# Patient Record
Sex: Male | Born: 1935 | Race: White | Hispanic: No | Marital: Married | Smoking: Former smoker
Health system: Southern US, Community
[De-identification: ages and names within clinical notes are randomized; demographics above are authoritative.]

## PROBLEM LIST (undated history)

## (undated) DIAGNOSIS — N009 Acute nephritic syndrome with unspecified morphologic changes: Secondary | ICD-10-CM

## (undated) DIAGNOSIS — Z8619 Personal history of other infectious and parasitic diseases: Secondary | ICD-10-CM

## (undated) DIAGNOSIS — G4730 Sleep apnea, unspecified: Secondary | ICD-10-CM

## (undated) DIAGNOSIS — I10 Essential (primary) hypertension: Secondary | ICD-10-CM

## (undated) DIAGNOSIS — R32 Unspecified urinary incontinence: Secondary | ICD-10-CM

## (undated) DIAGNOSIS — M1990 Unspecified osteoarthritis, unspecified site: Secondary | ICD-10-CM

## (undated) DIAGNOSIS — E039 Hypothyroidism, unspecified: Secondary | ICD-10-CM

## (undated) DIAGNOSIS — T7840XA Allergy, unspecified, initial encounter: Secondary | ICD-10-CM

## (undated) DIAGNOSIS — B1910 Unspecified viral hepatitis B without hepatic coma: Secondary | ICD-10-CM

## (undated) DIAGNOSIS — E785 Hyperlipidemia, unspecified: Secondary | ICD-10-CM

## (undated) DIAGNOSIS — E119 Type 2 diabetes mellitus without complications: Secondary | ICD-10-CM

## (undated) DIAGNOSIS — Z87442 Personal history of urinary calculi: Secondary | ICD-10-CM

## (undated) DIAGNOSIS — B159 Hepatitis A without hepatic coma: Secondary | ICD-10-CM

## (undated) DIAGNOSIS — H9190 Unspecified hearing loss, unspecified ear: Secondary | ICD-10-CM

## (undated) DIAGNOSIS — IMO0002 Reserved for concepts with insufficient information to code with codable children: Secondary | ICD-10-CM

## (undated) DIAGNOSIS — L578 Other skin changes due to chronic exposure to nonionizing radiation: Secondary | ICD-10-CM

## (undated) DIAGNOSIS — H548 Legal blindness, as defined in USA: Secondary | ICD-10-CM

## (undated) DIAGNOSIS — N400 Enlarged prostate without lower urinary tract symptoms: Secondary | ICD-10-CM

## (undated) DIAGNOSIS — I2510 Atherosclerotic heart disease of native coronary artery without angina pectoris: Secondary | ICD-10-CM

## (undated) DIAGNOSIS — D649 Anemia, unspecified: Secondary | ICD-10-CM

## (undated) DIAGNOSIS — H269 Unspecified cataract: Secondary | ICD-10-CM

## (undated) DIAGNOSIS — K219 Gastro-esophageal reflux disease without esophagitis: Secondary | ICD-10-CM

## (undated) DIAGNOSIS — A159 Respiratory tuberculosis unspecified: Secondary | ICD-10-CM

## (undated) HISTORY — DX: Benign prostatic hyperplasia without lower urinary tract symptoms: N40.0

## (undated) HISTORY — PX: EYE SURGERY: SHX253

## (undated) HISTORY — DX: Unspecified cataract: H26.9

## (undated) HISTORY — DX: Atherosclerotic heart disease of native coronary artery without angina pectoris: I25.10

## (undated) HISTORY — PX: CATARACT EXTRACTION W/ INTRAOCULAR LENS  IMPLANT, BILATERAL: SHX1307

## (undated) HISTORY — DX: Hepatitis a without hepatic coma: B15.9

## (undated) HISTORY — PX: JOINT REPLACEMENT: SHX530

## (undated) HISTORY — DX: Unspecified osteoarthritis, unspecified site: M19.90

## (undated) HISTORY — DX: Allergy, unspecified, initial encounter: T78.40XA

## (undated) HISTORY — PX: TONSILLECTOMY: SUR1361

## (undated) HISTORY — DX: Acute nephritic syndrome with unspecified morphologic changes: N00.9

## (undated) HISTORY — PX: UMBILICAL HERNIA REPAIR: SHX196

## (undated) HISTORY — DX: Other skin changes due to chronic exposure to nonionizing radiation: L57.8

## (undated) HISTORY — PX: RETINAL DETACHMENT SURGERY: SHX105

## (undated) HISTORY — DX: Personal history of other infectious and parasitic diseases: Z86.19

## (undated) HISTORY — DX: Hyperlipidemia, unspecified: E78.5

## (undated) HISTORY — DX: Essential (primary) hypertension: I10

## (undated) HISTORY — PX: CARDIAC CATHETERIZATION: SHX172

## (undated) HISTORY — PX: INGUINAL HERNIA REPAIR: SUR1180

## (undated) HISTORY — DX: Anemia, unspecified: D64.9

## (undated) HISTORY — DX: Hypothyroidism, unspecified: E03.9

## (undated) HISTORY — PX: UVULOPALATOPLASTY: SHX2633

---
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
## 1979-04-10 DIAGNOSIS — B1910 Unspecified viral hepatitis B without hepatic coma: Secondary | ICD-10-CM

## 1979-04-10 HISTORY — DX: Unspecified viral hepatitis B without hepatic coma: B19.10

## 1989-04-09 DIAGNOSIS — G4730 Sleep apnea, unspecified: Secondary | ICD-10-CM

## 1989-04-09 HISTORY — DX: Sleep apnea, unspecified: G47.30

## 2002-07-03 ENCOUNTER — Encounter: Payer: Self-pay | Admitting: Ophthalmology

## 2002-07-03 ENCOUNTER — Ambulatory Visit (HOSPITAL_COMMUNITY): Admission: RE | Admit: 2002-07-03 | Discharge: 2002-07-03 | Payer: Self-pay | Admitting: Ophthalmology

## 2002-07-04 ENCOUNTER — Encounter (INDEPENDENT_AMBULATORY_CARE_PROVIDER_SITE_OTHER): Payer: Self-pay | Admitting: *Deleted

## 2004-01-22 ENCOUNTER — Inpatient Hospital Stay (HOSPITAL_COMMUNITY): Admission: RE | Admit: 2004-01-22 | Discharge: 2004-01-28 | Payer: Self-pay | Admitting: Cardiology

## 2004-01-23 HISTORY — PX: CORONARY ARTERY BYPASS GRAFT: SHX141

## 2004-02-17 ENCOUNTER — Encounter (HOSPITAL_COMMUNITY): Admission: RE | Admit: 2004-02-17 | Discharge: 2004-05-17 | Payer: Self-pay | Admitting: *Deleted

## 2004-03-20 ENCOUNTER — Ambulatory Visit (HOSPITAL_COMMUNITY): Admission: RE | Admit: 2004-03-20 | Discharge: 2004-03-20 | Payer: Self-pay | Admitting: Ophthalmology

## 2004-03-25 ENCOUNTER — Ambulatory Visit (HOSPITAL_COMMUNITY): Admission: AD | Admit: 2004-03-25 | Discharge: 2004-03-26 | Payer: Self-pay | Admitting: Ophthalmology

## 2004-05-18 ENCOUNTER — Encounter (HOSPITAL_COMMUNITY): Admission: RE | Admit: 2004-05-18 | Discharge: 2004-06-13 | Payer: Self-pay | Admitting: *Deleted

## 2004-06-16 ENCOUNTER — Encounter (HOSPITAL_COMMUNITY): Admission: RE | Admit: 2004-06-16 | Discharge: 2004-09-14 | Payer: Self-pay | Admitting: *Deleted

## 2004-06-19 ENCOUNTER — Ambulatory Visit: Payer: Self-pay | Admitting: Family Medicine

## 2004-07-13 ENCOUNTER — Ambulatory Visit (HOSPITAL_COMMUNITY): Admission: RE | Admit: 2004-07-13 | Discharge: 2004-07-13 | Payer: Self-pay | Admitting: Ophthalmology

## 2004-10-07 ENCOUNTER — Encounter (HOSPITAL_COMMUNITY): Admission: RE | Admit: 2004-10-07 | Discharge: 2005-01-05 | Payer: Self-pay | Admitting: *Deleted

## 2004-11-02 ENCOUNTER — Ambulatory Visit: Payer: Self-pay | Admitting: Family Medicine

## 2004-11-03 ENCOUNTER — Ambulatory Visit: Payer: Self-pay | Admitting: Family Medicine

## 2004-11-23 ENCOUNTER — Ambulatory Visit: Payer: Self-pay | Admitting: *Deleted

## 2004-11-24 ENCOUNTER — Ambulatory Visit: Payer: Self-pay | Admitting: Family Medicine

## 2005-01-07 ENCOUNTER — Encounter (HOSPITAL_COMMUNITY): Admission: RE | Admit: 2005-01-07 | Discharge: 2005-04-07 | Payer: Self-pay | Admitting: *Deleted

## 2005-02-02 ENCOUNTER — Ambulatory Visit: Payer: Self-pay | Admitting: Family Medicine

## 2005-03-24 ENCOUNTER — Ambulatory Visit: Payer: Self-pay

## 2005-04-09 ENCOUNTER — Encounter (HOSPITAL_COMMUNITY): Admission: RE | Admit: 2005-04-09 | Discharge: 2005-07-08 | Payer: Self-pay | Admitting: *Deleted

## 2005-07-09 ENCOUNTER — Encounter (HOSPITAL_COMMUNITY): Admission: RE | Admit: 2005-07-09 | Discharge: 2005-10-07 | Payer: Self-pay | Admitting: *Deleted

## 2005-10-08 ENCOUNTER — Encounter (HOSPITAL_COMMUNITY): Admission: RE | Admit: 2005-10-08 | Discharge: 2006-01-06 | Payer: Self-pay | Admitting: *Deleted

## 2006-01-07 ENCOUNTER — Encounter (HOSPITAL_COMMUNITY): Admission: RE | Admit: 2006-01-07 | Discharge: 2006-04-07 | Payer: Self-pay | Admitting: *Deleted

## 2006-02-15 ENCOUNTER — Ambulatory Visit: Payer: Self-pay | Admitting: Family Medicine

## 2006-03-16 ENCOUNTER — Ambulatory Visit: Payer: Self-pay | Admitting: Internal Medicine

## 2006-04-09 ENCOUNTER — Encounter (HOSPITAL_COMMUNITY): Admission: RE | Admit: 2006-04-09 | Discharge: 2006-07-08 | Payer: Self-pay | Admitting: *Deleted

## 2006-04-19 ENCOUNTER — Ambulatory Visit: Payer: Self-pay | Admitting: Internal Medicine

## 2007-02-13 ENCOUNTER — Ambulatory Visit: Payer: Self-pay | Admitting: Internal Medicine

## 2007-04-07 DIAGNOSIS — E039 Hypothyroidism, unspecified: Secondary | ICD-10-CM | POA: Insufficient documentation

## 2007-04-07 DIAGNOSIS — L568 Other specified acute skin changes due to ultraviolet radiation: Secondary | ICD-10-CM | POA: Insufficient documentation

## 2007-04-07 DIAGNOSIS — M109 Gout, unspecified: Secondary | ICD-10-CM | POA: Insufficient documentation

## 2007-04-07 DIAGNOSIS — Z8619 Personal history of other infectious and parasitic diseases: Secondary | ICD-10-CM | POA: Insufficient documentation

## 2007-04-07 DIAGNOSIS — N059 Unspecified nephritic syndrome with unspecified morphologic changes: Secondary | ICD-10-CM | POA: Insufficient documentation

## 2007-04-07 DIAGNOSIS — Z794 Long term (current) use of insulin: Secondary | ICD-10-CM | POA: Insufficient documentation

## 2007-04-07 DIAGNOSIS — I10 Essential (primary) hypertension: Secondary | ICD-10-CM | POA: Insufficient documentation

## 2007-04-07 DIAGNOSIS — E118 Type 2 diabetes mellitus with unspecified complications: Secondary | ICD-10-CM | POA: Insufficient documentation

## 2007-04-07 DIAGNOSIS — E119 Type 2 diabetes mellitus without complications: Secondary | ICD-10-CM | POA: Insufficient documentation

## 2007-04-07 DIAGNOSIS — E785 Hyperlipidemia, unspecified: Secondary | ICD-10-CM | POA: Insufficient documentation

## 2007-05-24 ENCOUNTER — Ambulatory Visit: Payer: Self-pay | Admitting: Family Medicine

## 2007-05-24 LAB — CONVERTED CEMR LAB
ALT: 64 units/L — ABNORMAL HIGH (ref 0–53)
AST: 76 units/L — ABNORMAL HIGH (ref 0–37)
Albumin: 4.2 g/dL (ref 3.5–5.2)
Alkaline Phosphatase: 75 units/L (ref 39–117)
BUN: 14 mg/dL (ref 6–23)
Bilirubin Urine: NEGATIVE
Calcium: 9.4 mg/dL (ref 8.4–10.5)
Cholesterol: 140 mg/dL (ref 0–200)
Creatinine,U: 196.6 mg/dL
Eosinophils Absolute: 0.1 10*3/uL (ref 0.0–0.6)
Eosinophils Relative: 2.8 % (ref 0.0–5.0)
HCT: 40.4 % (ref 39.0–52.0)
Ketones, urine, test strip: NEGATIVE
MCV: 94 fL (ref 78.0–100.0)
Microalb Creat Ratio: 6.1 mg/g (ref 0.0–30.0)
Microalb, Ur: 1.2 mg/dL (ref 0.0–1.9)
Monocytes Absolute: 0.5 10*3/uL (ref 0.2–0.7)
Monocytes Relative: 10.7 % (ref 3.0–11.0)
Neutro Abs: 2.8 10*3/uL (ref 1.4–7.7)
Platelets: 173 10*3/uL (ref 150–400)
RDW: 12.6 % (ref 11.5–14.6)
Specific Gravity, Urine: 1.025
TSH: 9.11 microintl units/mL — ABNORMAL HIGH (ref 0.35–5.50)
Total CHOL/HDL Ratio: 3.7
Triglycerides: 144 mg/dL (ref 0–149)
WBC: 4.8 10*3/uL (ref 4.5–10.5)
pH: 5.5

## 2007-05-30 ENCOUNTER — Ambulatory Visit: Payer: Self-pay | Admitting: Family Medicine

## 2007-05-30 DIAGNOSIS — N400 Enlarged prostate without lower urinary tract symptoms: Secondary | ICD-10-CM | POA: Insufficient documentation

## 2007-08-07 ENCOUNTER — Ambulatory Visit: Payer: Self-pay | Admitting: Family Medicine

## 2007-08-08 ENCOUNTER — Telehealth: Payer: Self-pay | Admitting: Family Medicine

## 2007-08-10 HISTORY — PX: QUADRICEPS REPAIR: SHX2281

## 2007-08-11 ENCOUNTER — Telehealth (INDEPENDENT_AMBULATORY_CARE_PROVIDER_SITE_OTHER): Payer: Self-pay | Admitting: *Deleted

## 2007-10-10 ENCOUNTER — Telehealth: Payer: Self-pay | Admitting: Family Medicine

## 2007-12-06 ENCOUNTER — Ambulatory Visit: Payer: Self-pay | Admitting: Internal Medicine

## 2007-12-08 ENCOUNTER — Ambulatory Visit: Payer: Self-pay | Admitting: Family Medicine

## 2007-12-08 ENCOUNTER — Telehealth: Payer: Self-pay | Admitting: Family Medicine

## 2007-12-15 ENCOUNTER — Telehealth: Payer: Self-pay | Admitting: Family Medicine

## 2008-02-07 ENCOUNTER — Ambulatory Visit: Payer: Self-pay | Admitting: Family Medicine

## 2008-05-21 ENCOUNTER — Ambulatory Visit: Payer: Self-pay | Admitting: Family Medicine

## 2008-05-29 ENCOUNTER — Ambulatory Visit: Payer: Self-pay | Admitting: Internal Medicine

## 2008-06-27 ENCOUNTER — Ambulatory Visit: Payer: Self-pay | Admitting: Family Medicine

## 2008-06-27 LAB — CONVERTED CEMR LAB
ALT: 37 units/L (ref 0–53)
AST: 44 units/L — ABNORMAL HIGH (ref 0–37)
Alkaline Phosphatase: 52 units/L (ref 39–117)
BUN: 14 mg/dL (ref 6–23)
Basophils Absolute: 0 10*3/uL (ref 0.0–0.1)
Basophils Relative: 0.4 % (ref 0.0–3.0)
Bilirubin Urine: NEGATIVE
Chloride: 103 meq/L (ref 96–112)
Cholesterol: 131 mg/dL (ref 0–200)
Eosinophils Absolute: 0.1 10*3/uL (ref 0.0–0.7)
Eosinophils Relative: 2.6 % (ref 0.0–5.0)
HCT: 40.3 % (ref 39.0–52.0)
HDL: 52.4 mg/dL (ref >39.0)
Hemoglobin: 14.1 g/dL (ref 13.0–17.0)
Microalb Creat Ratio: 4 mg/g (ref 0.0–30.0)
Microalb, Ur: 0.4 mg/dL (ref 0.0–1.9)
Monocytes Absolute: 0.4 10*3/uL (ref 0.1–1.0)
Nitrite: NEGATIVE
RBC: 4.17 M/uL — ABNORMAL LOW (ref 4.22–5.81)
Specific Gravity, Urine: 1.025
TSH: 4.68 microintl units/mL (ref 0.35–5.50)
Total Protein: 6.3 g/dL (ref 6.0–8.3)
Triglycerides: 83 mg/dL (ref 0–149)
VLDL: 17 mg/dL (ref 0–40)
WBC Urine, dipstick: NEGATIVE
WBC: 4.5 10*3/uL (ref 4.5–10.5)
pH: 5

## 2008-07-02 ENCOUNTER — Telehealth: Payer: Self-pay | Admitting: Family Medicine

## 2008-07-15 ENCOUNTER — Ambulatory Visit: Payer: Self-pay | Admitting: Family Medicine

## 2008-08-29 ENCOUNTER — Telehealth: Payer: Self-pay | Admitting: Family Medicine

## 2008-09-18 ENCOUNTER — Ambulatory Visit: Payer: Self-pay | Admitting: Family Medicine

## 2008-09-18 LAB — CONVERTED CEMR LAB
BUN: 17 mg/dL (ref 6–23)
Chloride: 108 meq/L (ref 96–112)
GFR calc Af Amer: 95 mL/min
GFR calc non Af Amer: 79 mL/min
Glucose, Bld: 145 mg/dL — ABNORMAL HIGH (ref 70–99)
Hgb A1c MFr Bld: 6.9 % — ABNORMAL HIGH (ref 4.6–6.0)
Sodium: 142 meq/L (ref 135–145)

## 2008-09-25 ENCOUNTER — Ambulatory Visit: Payer: Self-pay | Admitting: Family Medicine

## 2008-09-25 DIAGNOSIS — N400 Enlarged prostate without lower urinary tract symptoms: Secondary | ICD-10-CM | POA: Insufficient documentation

## 2009-01-03 ENCOUNTER — Ambulatory Visit: Payer: Self-pay | Admitting: Family Medicine

## 2009-01-03 DIAGNOSIS — N200 Calculus of kidney: Secondary | ICD-10-CM | POA: Insufficient documentation

## 2009-01-03 LAB — CONVERTED CEMR LAB
Ketones, urine, test strip: NEGATIVE
pH: 5

## 2009-01-08 ENCOUNTER — Ambulatory Visit: Payer: Self-pay | Admitting: Family Medicine

## 2009-01-10 ENCOUNTER — Telehealth: Payer: Self-pay | Admitting: Family Medicine

## 2009-01-13 LAB — CONVERTED CEMR LAB
BUN: 18 mg/dL (ref 6–23)
Calcium: 9.6 mg/dL (ref 8.4–10.5)
Chloride: 111 meq/L (ref 96–112)
Creatinine, Ser: 1.2 mg/dL (ref 0.4–1.5)
Glucose, Bld: 183 mg/dL — ABNORMAL HIGH (ref 70–99)
Potassium: 4.3 meq/L (ref 3.5–5.1)

## 2009-03-11 ENCOUNTER — Ambulatory Visit: Payer: Self-pay | Admitting: Family Medicine

## 2009-03-11 LAB — CONVERTED CEMR LAB
BUN: 16 mg/dL
CO2: 29 meq/L
Calcium: 9 mg/dL
Chloride: 107 meq/L
Creatinine, Ser: 1 mg/dL
GFR calc non Af Amer: 78.31 mL/min
Glucose, Bld: 125 mg/dL — ABNORMAL HIGH
Potassium: 3.8 meq/L
Sodium: 142 meq/L

## 2009-05-22 ENCOUNTER — Encounter (INDEPENDENT_AMBULATORY_CARE_PROVIDER_SITE_OTHER): Payer: Self-pay | Admitting: *Deleted

## 2009-06-23 ENCOUNTER — Telehealth: Payer: Self-pay | Admitting: Family Medicine

## 2009-06-26 DIAGNOSIS — I2510 Atherosclerotic heart disease of native coronary artery without angina pectoris: Secondary | ICD-10-CM | POA: Insufficient documentation

## 2009-06-27 ENCOUNTER — Ambulatory Visit: Payer: Self-pay | Admitting: Internal Medicine

## 2009-07-25 ENCOUNTER — Ambulatory Visit: Payer: Self-pay | Admitting: Family Medicine

## 2009-07-25 ENCOUNTER — Encounter (INDEPENDENT_AMBULATORY_CARE_PROVIDER_SITE_OTHER): Payer: Self-pay | Admitting: *Deleted

## 2009-07-25 LAB — CONVERTED CEMR LAB
ALT: 18 units/L (ref 0–53)
Alkaline Phosphatase: 46 units/L (ref 39–117)
Basophils Absolute: 0 10*3/uL (ref 0.0–0.1)
Basophils Relative: 0 % (ref 0–1)
Bilirubin, Direct: 0.2 mg/dL (ref 0.0–0.3)
CO2: 27 meq/L (ref 19–32)
Calcium: 10 mg/dL (ref 8.4–10.5)
Creatinine, Ser: 0.93 mg/dL (ref 0.40–1.50)
Creatinine, Urine: 151 mg/dL
Eosinophils Absolute: 0.1 10*3/uL (ref 0.0–0.7)
Eosinophils Relative: 2 % (ref 0–5)
HDL: 52 mg/dL (ref >39)
Hemoglobin: 14.5 g/dL (ref 13.0–17.0)
Hgb A1c MFr Bld: 6.7 % — ABNORMAL HIGH (ref 4.6–6.1)
Indirect Bilirubin: 0.6 mg/dL (ref 0.0–0.9)
Lymphs Abs: 1.9 10*3/uL (ref 0.7–4.0)
MCHC: 34.6 g/dL (ref 30.0–36.0)
MCV: 89.3 fL (ref 78.0–100.0)
Neutrophils Relative %: 54 % (ref 43–77)
PSA, Free: 0.3 ng/mL
RBC: 4.69 M/uL (ref 4.22–5.81)
RDW: 14.1 % (ref 11.5–15.5)
Total Bilirubin: 0.8 mg/dL (ref 0.3–1.2)
Total Protein: 6.5 g/dL (ref 6.0–8.3)
VLDL: 16 mg/dL (ref 0–40)
WBC: 5.5 10*3/uL (ref 4.0–10.5)

## 2009-07-30 ENCOUNTER — Ambulatory Visit: Payer: Self-pay | Admitting: Internal Medicine

## 2009-10-08 ENCOUNTER — Ambulatory Visit: Payer: Self-pay | Admitting: Family Medicine

## 2009-10-13 LAB — CONVERTED CEMR LAB
BUN: 11 mg/dL (ref 6–23)
CO2: 31 meq/L (ref 19–32)
Calcium: 9.4 mg/dL (ref 8.4–10.5)
Chloride: 104 meq/L (ref 96–112)
Creatinine, Ser: 0.9 mg/dL (ref 0.4–1.5)

## 2010-03-16 ENCOUNTER — Ambulatory Visit: Payer: Self-pay | Admitting: Internal Medicine

## 2010-07-20 ENCOUNTER — Telehealth: Payer: Self-pay | Admitting: Family Medicine

## 2010-09-08 NOTE — Assessment & Plan Note (Signed)
Summary: per check out/sf  Medications Added VITAMIN B-1 100 MG TABS (THIAMINE HCL) Take 1 tablet by mouth once a day VITAMIN B-6 50 MG TABS (PYRIDOXINE HCL) Take 1 tablet by mouth once a day NIACIN 100 MG TABS (NIACIN) Take 1 tablet by mouth once a day MULTIVITAMINS  TABS (MULTIPLE VITAMIN) Take 1 tablet by mouth once a day      Allergies Added: NKDA  Visit Type:  Follow-up Primary Glenda Spelman:  Roderick Pee MD  CC:  No complains.  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  He is a pleasant 75 yo man with a h/o CAD, HTN, s/p CABG.  Prior to his CABG he did not have any anginal symptoms.  When I had him repeat his exercise treadmill last, he had ST depression with exertional which resolved immediately with rest and was asymptomatic.  He returns for additional evaluation.  He notes that he has been more sedentary lately.  No other complaints.  His DM has been well controlled.  Current Medications (verified): 1)  Toprol Xl 50 Mg  Tb24 (Metoprolol Succinate) .... Take 1/2 Every Morning 2)  Lotensin Hct 20-25 Mg  Tabs (Benazepril-Hydrochlorothiazide) .... Take 1 Tablet By Mouth Once A Day 3)  Colchicine 0.6 Mg  Tabs (Colchicine) .... Take 1 Tablet By Mouth Once A Day 4)  Allopurinol 300 Mg  Tabs (Allopurinol) .... Take 1 Tablet By Mouth Once A Day 5)  Aspirin 325 Mg  Tbec (Aspirin) .... Once Daily 6)  Glipizide 10 Mg  Tb24 (Glipizide) .... Take 1 Tablet By Mouth Two Times A Day 7)  Omega-3 350 Mg  Caps (Omega-3 Fatty Acids) .... 2 Once Daily 8)  Glucophage 1000 Mg  Tabs (Metformin Hcl) .... Take 1 Tablet By Mouth Two Times A Day 9)  Nitroglycerin 0.4 Mg  Subl (Nitroglycerin) .... As Needed 10)  Vitamin B-1 100 Mg Tabs (Thiamine Hcl) .... Take 1 Tablet By Mouth Once A Day 11)  Vitamin B-6 50 Mg Tabs (Pyridoxine Hcl) .... Take 1 Tablet By Mouth Once A Day 12)  Niacin 100 Mg Tabs (Niacin) .... Take 1 Tablet By Mouth Once A Day 13)  Zocor 40 Mg  Tabs  (Simvastatin) .[name] Kitchen.. 1 Tab @ Bedtime 14)  Synthroid 100 Mcg  Tabs (Levothyroxine Sodium) .... Take 1 Tablet By Mouth Every Morning 15)  Lantus 100 Unit/ml Soln (Insulin Glargine) .... 24 Units At Bedtime 16)  Actos 45 Mg Tabs (Pioglitazone Hcl) .... Take 1 Tablet By Mouth Every Morning 17)  Freestyle Test  Strp (Glucose Blood) .... Use 1-2 Times Daily As Directed 18)  Tamsulosin Hcl 0.4 Mg Caps (Tamsulosin Hcl) .... Take One Cap At Bedtime 19)  Multivitamins  Tabs (Multiple Vitamin) .... Take 1 Tablet By Mouth Once A Day  Allergies (verified): No Known Drug Allergies  Past History:  Past Medical History: Last updated: 06/26/2009 Coronary artery disease status post bypass surgery with preserved left ventricular function Diabetes mellitus, type II Gout Hepatitis B, hx of Hyperlipidemia Hypertension Hypothyroidism Hepatitis A glomerulonephritis chronic sun damage Benign prostatic hypertrophy  Past Surgical History: Last updated: 04/07/2007 Coronary artery bypass graft, bilateral 2004 Inguinal herniorrhaphy, R  Review of Systems  The patient denies chest pain, syncope, dyspnea on exertion, and peripheral edema.    Vital Signs:  Patient profile:   75 year old male Height:      72.5 inches Weight:      237 pounds BMI:     31.82 Pulse  rate:   62 / minute Pulse rhythm:   regular Resp:     18 per minute BP sitting:   104 / 72  (left arm) Cuff size:   large  Vitals Entered By: Vikki Ports (March 16, 2010 3:47 PM)  Physical Exam  General:  Well-developed,well-nourished,in no acute distress; alert,appropriate and cooperative throughout examination Head:  Normocephalic and atraumatic without obvious abnormalities. No apparent alopecia or balding. Mouth:  Oral mucosa and oropharynx without lesions or exudates.  Teeth in good repair. Neck:  No deformities, masses, or tenderness noted. Chest Wall:  No deformities, masses, tenderness or gynecomastia noted. Lungs:  Normal  respiratory effort, chest expands symmetrically. Lungs are clear to auscultation, no crackles or wheezes. Heart:  Normal rate and regular rhythm. S1 and S2 normal without gallop, murmur, click, rub or other extra sounds. Abdomen:  Bowel sounds positive,abdomen soft and non-tender without masses, organomegaly or hernias noted. Msk:  No deformity or scoliosis noted of thoracic or lumbar spine.   Pulses:  R and L carotid,radial,femoral,dorsalis pedis and posterior tibial pulses are full and equal bilaterally Extremities:  No clubbing, cyanosis, edema, or deformity noted with normal full range of motion of all joints.   Neurologic:  Alert and oriented x 3.   Impression & Recommendations:  Problem # 1:  CAD (ICD-414.00) He has no symptoms. I have asked him to continue his current meds and maintain a low sodium diet. He will start walking more frequently and I will plan to obtain a perfusion stress test in 6 months when I see him back if he does not develop more symptoms. His updated medication list for this problem includes:    Toprol Xl 50 Mg Tb24 (Metoprolol succinate) .[name] Kitchen... Take 1/2 every morning    Lotensin Hct 20-25 Mg Tabs (Benazepril-hydrochlorothiazide) .[name] Kitchen... Take 1 tablet by mouth once a day    Aspirin 325 Mg Tbec (Aspirin) ..... Once daily    Nitroglycerin 0.4 Mg Subl (Nitroglycerin) .[name] Kitchen... As needed  Problem # 2:  HYPERTENSION (ICD-401.9) His blood pressure is well controlled.  He will continue meds as below. His updated medication list for this problem includes:    Toprol Xl 50 Mg Tb24 (Metoprolol succinate) .[name] Kitchen... Take 1/2 every morning    Lotensin Hct 20-25 Mg Tabs (Benazepril-hydrochlorothiazide) .[name] Kitchen... Take 1 tablet by mouth once a day    Aspirin 325 Mg Tbec (Aspirin) ..... Once daily  Problem # 3:  HYPERLIPIDEMIA (ICD-272.4) Continue current meds.  A low fat diet is requested. His updated medication list for this problem includes:    Niacin 100 Mg Tabs (Niacin) .[name] Kitchen... Take 1  tablet by mouth once a day    Zocor 40 Mg Tabs (Simvastatin) .[name] Kitchen... 1 tab @ bedtime  Patient Instructions: 1)  Your physician wants you to follow-up in: 6 months  You will receive a reminder letter in the mail two months in advance. If you don't receive a letter, please call our office to schedule the follow-up appointment.

## 2010-09-10 NOTE — Progress Notes (Signed)
Summary: refills  Phone Note Refill Request Message from:  Pharmacy on July 20, 2010 9:31 AM  Refills Requested: Medication #1:  LANTUS 100 UNIT/ML SOLN 24 units at bedtime Initial call taken by: Kern Reap CMA Duncan Dull),  July 20, 2010 9:31 AM    Prescriptions: LANTUS 100 UNIT/ML SOLN (INSULIN GLARGINE) 24 units at bedtime  #2 vials x 0   Entered by:   Kern Reap CMA (AAMA)   Authorized by:   Roderick Pee MD   Signed by:   Kern Reap CMA (AAMA) on 07/20/2010   Method used:   Faxed to ...       [facility]  [phone]* (retail)       [address] [place], [place]  [address]       Ph: [phone] or [phone]       Fax: [phone]   RxID:   [id number]

## 2010-09-16 ENCOUNTER — Other Ambulatory Visit: Payer: Self-pay | Admitting: Family Medicine

## 2010-10-04 ENCOUNTER — Encounter: Payer: Self-pay | Admitting: Family Medicine

## 2010-10-05 ENCOUNTER — Encounter: Payer: Self-pay | Admitting: Family Medicine

## 2010-10-05 ENCOUNTER — Telehealth: Payer: Self-pay | Admitting: Family Medicine

## 2010-10-05 ENCOUNTER — Ambulatory Visit (INDEPENDENT_AMBULATORY_CARE_PROVIDER_SITE_OTHER): Payer: Medicare Other | Admitting: Family Medicine

## 2010-10-05 DIAGNOSIS — E785 Hyperlipidemia, unspecified: Secondary | ICD-10-CM

## 2010-10-05 DIAGNOSIS — N401 Enlarged prostate with lower urinary tract symptoms: Secondary | ICD-10-CM

## 2010-10-05 DIAGNOSIS — E039 Hypothyroidism, unspecified: Secondary | ICD-10-CM

## 2010-10-05 DIAGNOSIS — M109 Gout, unspecified: Secondary | ICD-10-CM

## 2010-10-05 DIAGNOSIS — N138 Other obstructive and reflux uropathy: Secondary | ICD-10-CM

## 2010-10-05 DIAGNOSIS — I10 Essential (primary) hypertension: Secondary | ICD-10-CM

## 2010-10-05 DIAGNOSIS — E119 Type 2 diabetes mellitus without complications: Secondary | ICD-10-CM

## 2010-10-05 LAB — TSH: TSH: 2.22 u[IU]/mL (ref 0.35–5.50)

## 2010-10-05 LAB — POCT URINALYSIS DIPSTICK
Bilirubin, UA: NEGATIVE
Blood, UA: NEGATIVE
Glucose, UA: NEGATIVE
Leukocytes, UA: NEGATIVE
Spec Grav, UA: 1.025
Urobilinogen, UA: 0.2

## 2010-10-05 LAB — HEPATIC FUNCTION PANEL
AST: 27 U/L (ref 0–37)
Albumin: 4 g/dL (ref 3.5–5.2)
Alkaline Phosphatase: 42 U/L (ref 39–117)
Bilirubin, Direct: 0.2 mg/dL (ref 0.0–0.3)
Total Bilirubin: 0.9 mg/dL (ref 0.3–1.2)
Total Protein: 6.1 g/dL (ref 6.0–8.3)

## 2010-10-05 LAB — BASIC METABOLIC PANEL
BUN: 11 mg/dL (ref 6–23)
Calcium: 9.3 mg/dL (ref 8.4–10.5)
Creatinine, Ser: 0.9 mg/dL (ref 0.4–1.5)

## 2010-10-05 LAB — CBC WITH DIFFERENTIAL/PLATELET
Basophils Relative: 0.1 % (ref 0.0–3.0)
HCT: 43.2 % (ref 39.0–52.0)
MCHC: 35 g/dL (ref 30.0–36.0)
Monocytes Relative: 9.8 % (ref 3.0–12.0)
Neutro Abs: 3.3 10*3/uL (ref 1.4–7.7)
RBC: 4.62 Mil/uL (ref 4.22–5.81)
WBC: 5.5 10*3/uL (ref 4.5–10.5)

## 2010-10-05 LAB — HEMOGLOBIN A1C: Hgb A1c MFr Bld: 6.7 % — ABNORMAL HIGH (ref 4.6–6.5)

## 2010-10-05 LAB — URIC ACID: Uric Acid, Serum: 4.4 mg/dL (ref 4.0–7.8)

## 2010-10-05 LAB — LIPID PANEL
Cholesterol: 126 mg/dL (ref 0–200)
LDL Cholesterol: 57 mg/dL (ref 0–99)
Triglycerides: 103 mg/dL (ref 0.0–149.0)

## 2010-10-05 LAB — MICROALBUMIN / CREATININE URINE RATIO: Microalb, Ur: 0.6 mg/dL (ref 0.0–1.9)

## 2010-10-05 MED ORDER — LEVOTHYROXINE SODIUM 100 MCG PO TABS: 100.0000 ug | ORAL_TABLET | ORAL | Status: DC

## 2010-10-05 MED ORDER — PIOGLITAZONE HCL 45 MG PO TABS: 45.0000 mg | ORAL_TABLET | Freq: Every day | ORAL | Status: DC

## 2010-10-05 MED ORDER — METFORMIN HCL 1000 MG PO TABS: 1000.0000 mg | ORAL_TABLET | Freq: Two times a day (BID) | ORAL | Status: DC

## 2010-10-05 MED ORDER — INSULIN GLARGINE 100 UNIT/ML ~~LOC~~ SOLN: 30.0000 [IU] | Freq: Every day | SUBCUTANEOUS | Status: DC

## 2010-10-05 MED ORDER — METOPROLOL SUCCINATE ER 50 MG PO TB24: 50.0000 mg | ORAL_TABLET | ORAL | Status: DC

## 2010-10-05 MED ORDER — SIMVASTATIN 40 MG PO TABS: 40.0000 mg | ORAL_TABLET | Freq: Every day | ORAL | Status: DC

## 2010-10-05 MED ORDER — ALLOPURINOL 300 MG PO TABS: 300.0000 mg | ORAL_TABLET | Freq: Every day | ORAL | Status: DC

## 2010-10-05 MED ORDER — GLUCOSE BLOOD VI STRP: ORAL_STRIP | Status: DC

## 2010-10-05 MED ORDER — NITROGLYCERIN 0.4 MG SL SUBL: 0.4000 mg | SUBLINGUAL_TABLET | SUBLINGUAL | Status: DC | PRN

## 2010-10-05 MED ORDER — GLIPIZIDE 10 MG PO TABS: 10.0000 mg | ORAL_TABLET | Freq: Two times a day (BID) | ORAL | Status: DC

## 2010-10-05 NOTE — Patient Instructions (Signed)
I will call you I get her lab work back.  Consider taking the Flomax, Monday, Wednesday, Friday.  Return in 3 months for follow-up, sooner if any problems

## 2010-10-05 NOTE — Progress Notes (Signed)
Subjective:    Patient ID: MYLZ YUAN, male    DOB: 08/24/1935, 75 y.o.   MRN: [id number]  HPI Jonathan [name] is a 75 year old, married male, nonsmoker retired anesthesiologist who comes in today for evaluation of diabetes, type 1, hypertension, hyperlipidemia, hypothyroidism, coronary disease.  His diabetes is treated with Actos 45 mg daily, glipizide, 10 mg b.i.d., Glucophage, 1000 mg b.i.d., and Lantus 30 units nightly labs pending.  States his blood sugars at home, sometimes drop to 60.  No symptoms of hypoglycemia.  He takes Lotensin 20 -- 25 daily and Toprol 50 mg dose one half tab daily for hypertension.  BP 108/78, pulse 70 and regular.  He takes Zocor 40 mg nightly for hyperlipidemia.  Lipids pending.  He takes Synthroid 100 mcg q.a.m. For hypothyroidism.  TSH pending.  Recent cardiac evaluation by Dr. Ladona Ridgel.  A 75 year ago showed a normal exam and a stress test.  Heart rate 130 no EKG changes.  He does not want to go back and have another stress test because he is asymptomatic.  He also takes Flomax .4 daily for BPH  He also takes allopurinol 300 mg daily, and an aspirin tablet......[name] Kitchenhe's having more joint pain, especially his left knee.  He forgot to call and get set up for his eye exam with the ophthalmologist.  Encouraged annual eye exam   Review of Systems  Constitutional: Negative.   HENT: Negative.   Eyes: Negative.   Respiratory: Negative.   Cardiovascular: Negative.   Gastrointestinal: Negative.   Genitourinary: Negative.   Musculoskeletal: Negative.   Skin: Negative.   Neurological: Negative.   Hematological: Negative.   Psychiatric/Behavioral: Negative.        Objective:   Physical Exam  Constitutional: He is oriented to person, place, and time. He appears well-developed and well-nourished.  HENT:  Head: Normocephalic and atraumatic.  Right Ear: External ear normal.  Left Ear: External ear normal.  Nose: Nose normal.  Mouth/Throat: Oropharynx is clear and  moist.  Eyes: Conjunctivae and EOM are normal. Pupils are equal, round, and reactive to light.  Neck: Normal range of motion. Neck supple. No JVD present. No tracheal deviation present. No thyromegaly present.  Cardiovascular: Normal rate, regular rhythm, normal heart sounds and intact distal pulses.  Exam reveals no gallop and no friction rub.   No murmur heard. Pulmonary/Chest: Effort normal and breath sounds normal. No stridor. No respiratory distress. He has no wheezes. He has no rales. He exhibits no tenderness.  Abdominal: Soft. Bowel sounds are normal. He exhibits no distension and no mass. There is no tenderness. There is no rebound and no guarding.  Genitourinary: Rectum normal.  Musculoskeletal: Normal range of motion. He exhibits no edema and no tenderness.  Lymphadenopathy:    He has no cervical adenopathy.  Neurological: He is alert and oriented to person, place, and time. He has normal reflexes. No cranial nerve deficit. He exhibits normal muscle tone.  Skin: Skin is warm and dry. No rash noted. No erythema. No pallor.  Psychiatric: He has a normal mood and affect. His behavior is normal. Judgment and thought content normal.          Assessment & Plan:  Coronary disease, asymptomatic,,,, continue diet and exercise program  Diabetes type II.  Continue current treatment program.  Follow-up A1c in 3 months.  Hypertension.  Continue current treatment program.  Hyperlipidemia.  Continue current treatment program.  Hypothyroidism.  Continue Synthroid 100 mcg daily.  BPH Flomax .4, Monday, Wednesday, Friday.  History of gout.  Check uric acid level.  Continue allopurinol and aspirin.  Degenerative joint disease with increasing pain, left knee referred to orthopedist.  Chronic sun damage.  Annual exam by dermatologist.  Annual eye exam

## 2010-10-05 NOTE — Telephone Encounter (Signed)
Spoke with pharmacy

## 2010-10-05 NOTE — Telephone Encounter (Signed)
[facility] needs verified of toprol bp med

## 2010-11-19 ENCOUNTER — Other Ambulatory Visit: Payer: Self-pay | Admitting: Family Medicine

## 2010-11-19 NOTE — Telephone Encounter (Signed)
Time for an office visit 

## 2010-11-27 ENCOUNTER — Other Ambulatory Visit: Payer: Self-pay | Admitting: Family Medicine

## 2010-12-09 ENCOUNTER — Other Ambulatory Visit: Payer: Self-pay | Admitting: Family Medicine

## 2010-12-22 NOTE — Assessment & Plan Note (Signed)
[facility]                         ELECTROPHYSIOLOGY OFFICE NOTE   NAME:Jonathan [name], Jonathan [name]                      MRN:          [id number]  DATE:12/06/2007                            DOB:          06/01/1936    [name] returns today for followup.  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  He also has very mild  hypertension and dyslipidemia and diabetes.  He has been well.  He  continues to play golf regularly and he is exercising three to four  times per week.  He has had no chest pain.  He denies shortness of  breath.  He denies palpitations or peripheral edema.   CURRENT MEDICATIONS:  1. His current medications include Detrol LA.  2. He takes Flomax 0.4 a day.  3. Synthroid 60 mcg daily.  4. He is also on niacin.  5. Multiple vitamins.  6. He feels aspirin 325 a day.  7. Simvastatin 40 a day.  8. Metoprolol 25 mg a  half tablet daily.  9. Allopurinol 300 a day.  10.Actos 45 a day.  11.Glipizide 10 twice a day.  12.Metformin 1 gram twice daily.  13.Lisinopril/HCTZ 20/12.5 daily.   PHYSICAL EXAMINATION:  GENERAL:  On exam he is a pleasant well-appearing  75 year old man in no acute distress.  VITAL SIGNS:  Blood pressure was 138/78, pulse 78 and regular,  respirations 18, weight was 231 pounds.  NECK:  Revealed no jugular venous distention.  LUNGS:  Clear bilaterally to auscultation.  No wheezes, rales or rhonchi  are present.  CARDIOVASCULAR:  Exam revealed a regular rate and rhythm with normal S1  and S2 and no murmurs, rubs or gallops present.  ABDOMEN:  Soft and nontender.  EXTREMITIES:  Demonstrated no edema.   The EKG demonstrates normal sinus rhythm with nonspecific T-wave  abnormality.   IMPRESSION:  1. Coronary disease status post bypass surgery.  2. Dyslipidemia on statin drugs.  3. Hypertension now well-controlled.  4.  Diabetes.   The patient is scheduled to follow up with his primary physician, Dr.  Tawanna Cooler, for hemoglobin A1c and lipid panel.  I will plan to see the  patient back in 6 months and then we will see him thereafter once yearly  unless he develops problems.     Doylene Canning. Ladona Ridgel, MD  Electronically Signed    GWT/MedQ  DD: 12/06/2007  DT: 12/06/2007  Job #: [id number]

## 2010-12-22 NOTE — Assessment & Plan Note (Signed)
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  He  has hypertension, dyslipidemia, and diabetes.  He returns today for  followup.  He has done well in the interim.  He continues to travel  regularly.  He had torn his quadriceps muscle and now notes that on the  side of his repair, he has some mild peripheral edema particularly when  he stands up for prolonged periods of time.  This being said, he has no  shortness of breath, no chest pain, and otherwise asymptomatic.   MEDICATIONS:  1. Lantus insulin as directed.  2. Synthroid 60 mcg daily.  3. Flomax 0.4 daily.  4. Detrol LA daily.  5. Niacin daily.  6. Aspirin 325 daily.  7. Simvastatin 40 a day.  8. Colchicine 0.6 daily.  9. Metoprolol 25 mg half tablet daily.  10.Actos 45 a day.  11.Metformin 1 g twice a day.  12.Glipizide 10 mg twice a day.  13.He is also on lisinopril/hydrochlorothiazide daily.   PHYSICAL EXAMINATION:  GENERAL:  He is a pleasant well-appearing 75-year-  old man in no distress.  VITAL SIGNS:  Blood pressure was 102/70, the pulse 73 and regular,  respirations were 18, the weight was 235 pounds.  NECK:  No jugular venous distention.  LUNGS:  Clear bilaterally to auscultation.  No wheezes, rales, or  rhonchi are present.  CARDIOVASCULAR:  Regular rate and rhythm.  Normal S1 and S2.  There are  no murmurs, rubs, or gallops appreciated.  ABDOMEN:  Soft, nontender.  EXTREMITIES:  Trace peripheral edema on the right, none on the left.   EKG demonstrates sinus rhythm with nonspecific T-wave abnormality both  inferiorly and anteriorly.  Of note, this is not significantly changed  from prior ECGs.   IMPRESSION:  1. Coronary artery disease status post bypass surgery with preserved      left ventricular function.  2. Diabetes.  3. Hypertension.  4. Dyslipidemia.   DISCUSSION:  Mr. Mabie is stable and I will plan to see him back in  the office in 1 year.  He follows up with Dr. Tawanna Cooler as well.     Doylene Canning. Ladona Ridgel, MD  Electronically Signed    GWT/MedQ  DD: 05/29/2008  DT: 05/29/2008  Job #: [id number]

## 2010-12-22 NOTE — Assessment & Plan Note (Signed)
[facility]                         ELECTROPHYSIOLOGY OFFICE NOTE   NAME:Jonathan [name], ALFREDO SPONG                      MRN:          [id number]  DATE:02/13/2007                            DOB:          Dec 23, 1935    REASON FOR VISIT:  Mr. Goracke is referred today by Dr. Corinda Gubler for  ongoing followup and treatment of his coronary disease as well as  dyslipidemia and hypertension.  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  He had basically asymptomatic severe  coronary disease and underwent bypass surgery back in 2005.  At that  time he was found to have left main disease as well as three vessel  disease with preserved left ventricular function. His only symptom was  that of fatigue and weakness.  He returns today for followup. He has  done quite well, denying chest pain and shortness of breath.  He has  otherwise no specific complaints.   PAST MEDICAL HISTORY:  He has a history of diabetes, hypertension and  gout.  He is also on Synthroid for thyroid dysfunction.   MEDICATIONS:  1. Lisinopril/hydrochlorothiazide 20/25 mg daily.  2. Metformin 1,000 mg twice a day.  3. Glipizide 10 mg twice a day.  4. Actos.  5. Allopurinol.  6. Metoprolol 25 mg daily.  7. Synthroid.  8. Simvastatin 40 mg a day.  9. Aspirin.  10.Niacin.  11.Multivitamins.   FAMILY HISTORY:  His family history is notable for coronary disease.   SOCIAL HISTORY:  The patient is married. He denies tobacco or ethanol  abuse though he does drink alcoholic beverages.   REVIEW OF SYSTEMS:  His review of systems is as noted in the history of  present illness, otherwise all systems are reviewed and found to be  negative.  In addition, on his review of systems, he does complain of  some problems with arthritis.   PHYSICAL EXAMINATION:  GENERAL APPEARANCE:  He is a pleasant, well-  appearing  75 year old man in no acute distress.  VITAL SIGNS:  His blood pressure was 112/78, pulse was 70 and regular,  the respirations were 18 and weight was 232 pounds.  HEENT:  Normocephalic, atraumatic.  Pupils equal, round.  Oropharynx is  moist.  Sclerae are anicteric.  NECK:  Reveals no jugular venous distention.  There is no thyromegaly.  Trachea is midline.  The carotids are 2+ and symmetric.  LUNGS:  Clear bilaterally to auscultation.  No rales, rhonchi or  wheezing are present.  There is no increased work of breathing.  CARDIOVASCULAR:  Exam reveals a regular rate and rhythm with normal S1  and S2.  I do not appreciate an S3 or S4 gallop.  The PMI was not  enlarged nor was it laterally displaced.  ABDOMEN:  Soft, nontender, nondistended.  There is no organomegaly.  The  bowel sounds are present.  There is no rebound or guarding.  EXTREMITIES:  Demonstrate no cyanosis, clubbing or edema.  The pulses  were 2+ and symmetric.  NEUROLOGICAL:  He is  alert and oriented x3 with cranial nerves II-XII  intact.  Strength is 5/5 and symmetric.   CLINICAL DATA:  The EKG demonstrates sinus rhythm with normal axis and  intervals.   IMPRESSION:  1. Coronary artery disease, status post bypass surgery.  2. Hypertension.  3. Dyslipidemia.  4. Diabetes.   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  I will plan to see him back in six  months for followup.  He states to me that he is having his blood work  done in Dr. Nelida Meuse office. We will want to aggressively control his  cholesterol and his blood pressure.     Doylene Canning. Ladona Ridgel, MD  Electronically Signed    GWT/MedQ  DD: 02/13/2007  DT: 02/14/2007  Job #: [id number]

## 2010-12-25 NOTE — H&P (Signed)
NAME:  Jonathan [name], BRUMMET NO.:  [id number]   MEDICAL RECORD NO.:  [id number]                   PATIENT TYPE:  OIB   LOCATION:                                       FACILITY:  [facility]   PHYSICIAN:  Cecil Cranker, M.D.             DATE OF BIRTH:  1935/10/03   DATE OF ADMISSION:  01/22/2004  DATE OF DISCHARGE:                                HISTORY & PHYSICAL   REASON FOR ADMISSION:  The patient is a 75 year old white married male, a  recently retired Technical brewer, with diabetes, hypertension, hyperlipidemia  and an abnormal stress test.   HISTORY OF PRESENT ILLNESS:  The patient has had no chest pain or  palpitations.  He occasionally notices some dyspnea on exertion when  climbing a hill.   He was diagnosed with hypertension and diabetes approximately ten years ago.  These have both been well controlled on medication.  He also was diagnosed  with hyperlipidemia and this has been controlled.   He had a routine stress test today which revealed more than 1 mm ST  depression during the stage two of the Bruce protocol.  This was downsloping  with T wave inversion.   There was fairly prompt resolution post exercise so that at one minute there  was only minimal ST depression. This was certainly considered a positive  stress test in early stage.   He was referred by Dr. Alonza Smoker because of this.   FAMILY HISTORY:  Mother had atrial fibrillation and a pacemaker.  Brother  has diabetes and hypertension.  Father had a MI at age 79.   RISK FACTORS:  As noted above.  He has not smoked in more than 30 years.   PAST MEDICAL HISTORY:  1. Acute glomerulonephritis at age [age], but no residual renal problems.  2. Herniorrhaphy in the past.  3. Bilateral cataract surgery.  4. Umbilical herniorrhaphy.   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  GU:  He has had no GU symptoms.  GI: He does have  occasional GI symptoms and takes Pepcid and Prilosec every four to five  days.   ALLERGIES:  None.   Past history also reveals that he has had hepatitis A and B.  He has no  seafood allergy.   MEDICATIONS:  1. Synthroid 0.1 mg every other day.  2. Glipizide 10 mg b.i.d.  3. Glucophage 1 gram b.i.d.  4. Allopurinol 300 mg daily.  5. Colchicine 0.6 mg.  6. Cardura 4 mg h.s.  7. Lipitor 10 mg.  8. Lisinopril/hydrochlorothiazide 20/25 mg daily.  9. Aspirin daily.  10.      Omega-3 vitamin.  11.      He was started on Toprol XL 25 mg today and the hydrochlorothiazide  was discontinued and the Cardura was discontinued.   PHYSICAL EXAMINATION:  VITAL SIGNS:  Blood pressure 90/62, pulse 75, normal  sinus rhythm.  GENERAL APPEARANCE:  Normal.  HEENT:  JVP is not elevated.  Carotid pulse is palpated without bruits.  The  thyroid is normal.  LUNGS:  Clear.  CARDIAC EXAM:  Normal without murmur or gallop.  ABDOMINAL EXAM: Normal; liver, spleen and kidneys are not palpable.  There  are no masses.  EXTREMITIES:  Excellent pulses with no edema.  NEUROLOGIC:  Unremarkable.   IMPRESSION:  1. Probable coronary artery disease with abnormal electrocardiogram and     stress test at stage two of Bruce protocol.  2. Hypertension, controlled.  3. Diabetes mellitus, controlled.  4. Hyperlipidemia, controlled.  5. Status post cataract surgery.  6. Status post herniorrhaphy.  7. History of hepatitis A and B.  8. Decreased hearing, right ear, secondary to mastoidectomy in 1943.   PLAN:  1. Continue medications as noted.  2. Because of the multiple risk factors and stage two positive     electrocardiogram stress test, I have suggested coronary angiography.  3. The patient is agreeable with this approach. I have explained the risks     and we will schedule him for January 21, 2004 at 3:30 p.m. per Dr. Riley Kill.  4. He is to hold his  Glucophage for two days.                                                Cecil Cranker, M.D.    EJL/MEDQ  D:  01/20/2004  T:  01/20/2004  Job:  [id number]   cc:   Tinnie Gens A. Tawanna Cooler, M.D. [facility]

## 2010-12-25 NOTE — Cardiovascular Report (Signed)
NAME:  [name], Jonathan NO.:  [id number]   MEDICAL RECORD NO.:  [id number]                   PATIENT TYPE:  OIB   LOCATION:  2311                                 FACILITY:  [facility]   PHYSICIAN:  Carole Binning, M.D. [facility]         DATE OF BIRTH:  [birth year]   DATE OF PROCEDURE:  01/22/2004  DATE OF DISCHARGE:                              CARDIAC CATHETERIZATION   PROCEDURE PERFORMED:  Left heart catheterization with coronary angiography,  left ventriculography and abdominal aortography.   INDICATION:  Mr. Hofmann is a 75 year old male with history of diabetes,  hypertension and hyperlipidemia.  He has had exertional dyspnea.  He  recently underwent an exercise treadmill test which was positive for ST  segment depression in stage 2 of a Bruce protocol.  He was therefore  referred for cardiac catheterization.   PROCEDURAL NOTE:  A 6 French sheath was placed in the right femoral artery.  Coronary angiography was performed with standard Judkins 6 French catheters.  Left ventriculography and abdominal aortography were performed with an  angled pigtail catheter.  Contrast was Omnipaque.  There were no  complications.   RESULTS:   HEMODYNAMICS:  1. Left ventricular pressure 136/16.  2. Aortic pressure 136/82.  3. There is no aortic valve gradient.   LEFT VENTRICULOGRAM:  Wall motion is normal.  Ejection fraction estimated at  greater than or equal to 60%.  There is no mitral regurgitation.   ABDOMINAL AORTOGRAM:  Abdominal aortogram reveals normal abdominal aorta,  renal arteries and iliac arteries.  There is mild tortuosity of the  abdominal aorta.   CORONARY ARTERIOGRAPHY (RIGHT DOMINANT):  Left main has a very eccentric and  irregular 80-90% stenosis throughout the length of the left main coronary  artery.   Left anterior descending artery has a tubular 70% stenosis at its ostium  followed by 50% stenosis in the mid body.  The LAD gives rise to a  normal  size diagonal branch which has a 50% stenosis.   Left circumflex comes off at a very sharp angle and has a 60% stenosis  followed by 90% stenosis on secondary bend.  Beyond this the circumflex  gives rise to a very large trifurcating first obtuse marginal which has a  50% stenosis proximally and a 30% stenosis in the medial branch.  There is a  normal size bifurcating second obtuse marginal arising from the distal  circumflex.   Right coronary artery is a dominant vessel.  It is 100% occluded proximally.  The mid and distal right coronary artery fill via mainly via right-to-right  collaterals as well as some left-to-right collaterals.  The distal right  coronary artery gives rise to normal size posterior descending artery and  normal size first posterior lateral branch and small second and third  posterior lateral branches.   IMPRESSION:  1. Preserved left ventricular systolic function.  2. Severe three-vessel coronary artery disease characterized by significant  left main disease and chronic total occlusion of the right coronary     artery.   PLAN:  The patient will be referred for coronary artery bypass surgery.                                               Carole Binning, M.D. [facility]    MWP/MEDQ  D:  01/22/2004  T:  01/23/2004  Job:  [id number]   cc:   Tinnie Gens A. Tawanna Cooler, M.D. [facility]   Bea Laura Graceann Congress, M.D.

## 2010-12-25 NOTE — Op Note (Signed)
NAME:  Jonathan [name], Jonathan [name]                         ACCOUNT NO.:  [id number]   MEDICAL RECORD NO.:  [id number]                   PATIENT TYPE:  OIB   LOCATION:  2871                                 FACILITY:  [facility]   PHYSICIAN:  Alford Highland. Rankin, M.D.                DATE OF BIRTH:  04-Nov-1935   DATE OF PROCEDURE:  03/20/2004  DATE OF DISCHARGE:  03/20/2004                                 OPERATIVE REPORT   PREOPERATIVE DIAGNOSIS:  Retinal detachment of the right eye.   POSTOPERATIVE DIAGNOSIS:  Retinal detachment of the right eye.   PROCEDURE:  Scleral buckle using 240, 287, and 70 sleeve of the right eye.   SURGEON:  Alford Highland. Rankin, MD.   ANESTHESIA:  Local retrobulbar monitored anesthesia control.   INDICATIONS FOR PROCEDURE:  The patient is a 75 year old man, who has visual  field loss superiorly on the basis of spontaneous right retinal detachment  with retinal breaks at the 6 o'clock, 5 o'clock, and 7 o'clock positions,  and an atrophic hole apparently at the 9 o'clock position.  This inferior  detachment with macular requires surgical repair.  The patient understood  the risks of surgery, including but not limited to hemorrhage, infection,  scarring, need for further surgery, no change in vision, loss of vision, and  the progressive nature of the disease.  He also understands the possible  risk of death with the anesthesia.  He understands these risks and wishes to  proceed.   Appropriate signed consent was obtained, was taken to the operating room, in  operating room appropriate monitors followed by mild sedation.  0.75%  Marcaine diluted with 5 mL of retrobulbar with an additional 5 mL with a  modified Darel Hong.   At this time, the right preauricular region was sterilely prepped and draped  in the usual ophthalmic fashion.  A lid speculum was applied.  The  conjunctival peritomy was then fashioned 360 degrees.  Rectus muscles  isolated on 2-0 silk ties.  An ophthalmoscopy was  then performed and the  retinal cryopexy played to the retinal tears mentioned previously.  A 287  __________ silicone exoplant was selected and used with a 240 encircling  band and secured in the superotemporal quadrant with a 7-0 Watzke sleeve.  5-  0 Mersilene mattress sutures were then used temporarily to secure the  buckle.  Direct observation was then used to drain subretinal fluid with the  27-gauge needle in the subretinal space.  This was secured without  difficulty.  Nearly all subretinal fluid was removed in this fashion.  A  small amount of fluid was left posterior to the buckle and could not be  reached.   At this time, the buckle was then tied and secured to the appropriate  height.  Indirect ophthalmoscopy again confirmed that the retina was  attached nicely.  The retinal breaks were supported properly on the scleral  buckle.  The band was then secured as well to the appropriate tension.  Intraocular pressures were found to be adequate.  At this time, all sutures  were tied permanently.  Conjunctiva and tenons were brought forward and  closed in layers with 7-0 Vicryl suture.   A subconjunctival injection of antibiotics was applied.  The buckle had been  irrigated with bug juice.   A silk patch and shield applied to the right eye.  The patient taken to the  recovery room in a good and stable condition.  No complications occurred.                                               Alford Highland Rankin, M.D.    GAR/MEDQ  D:  03/23/2004  T:  03/23/2004  Job:  [id number]

## 2010-12-25 NOTE — Op Note (Signed)
NAME:  Jonathan [name], Jonathan [name]                         ACCOUNT NO.:  [id number]   MEDICAL RECORD NO.:  [id number]                   PATIENT TYPE:  OIB   LOCATION:  2856                                 FACILITY:  [facility]   PHYSICIAN:  Alford Highland. Rankin, M.D.                DATE OF BIRTH:  [birth year]   DATE OF PROCEDURE:  DATE OF DISCHARGE:  07/03/2002                                 OPERATIVE REPORT   PREOPERATIVE DIAGNOSES:  1. Dislocated intraocular lens in the vitreous body, nonmagnetic foreign     body in the vitreous cavity, left eye.  2. Aphakia of the left eye.   POSTOPERATIVE DIAGNOSES:  1. Dislocated intraocular lens in the vitreous body, nonmagnetic foreign     body in the vitreous cavity, left eye.  2. Aphakia of the left eye.   PROCEDURE:  1. Posterior vitrectomy, left eye.  2. Removal of non magnetic foreign body, left eye.  3. Insertion of posterior chamber intraocular lens into the sulcus of the     left eye. The lens was EZE-60 power of +140.0, serial #[id number].   SURGEON:  Alford Highland. Rankin, M.D.   ANESTHESIA:  Local retrobulbar, monitored anesthesia control.   INDICATIONS FOR PROCEDURE:  The patient is a 75 year old man who is one day  status post intraocular lens placement. He has a posterior dislocation of  his foreign body material into the vitreous cavity as an intraocular lens.  This is an attempt to retrieve from the vitreous cavity. The risks of  retinal detachment and/or tear of a large intraocular lens removal through  the anterior chamber with forceps extraction and subsequent placement of an  intraocular lens in order to repair  the site in the left eye.   The patient understands the risks of anesthesia including the risk of death,  loss of the eye including but not limited to hemorrhage, infection,  scarring, need for surgery, noncorrection of vision, loss of vision,  progression of disease despite intervention and the  need for another  surgery, including  dislocation of this secondary lens implant if it is  indeed inside the posterior chamber. He understands that the attempt will be  made to place a new lens in the posterior chamber because of its safety  relatively, but also that the risks of dislocation is real. He understands  the risks, wishes to proceed and appropriate signed consent was obtained.   DESCRIPTION OF PROCEDURE:  The patient was brought to the operating room. In  the operating room appropriate monitors followed by mild sedation and 0.75%  Marcaine were delivered in portions retrobulbar and 5 cc laterally in  technique of modified Darel Hong. Lid retractors were placed in the usual  ophthalmic fashion. A lid speculum was applied. A conjunctiva peritomy was  fashioned superiorly and inferotemporally. A 4 mm infusion was secured 3.5  mm posterior to the limbus. It was placed in  the vitreous cavity and  verified visually. The superior sclerotomies were then fashioned. A Wild  microscope was placed in position with BIOM attached.  Core vitrectomy was  then begun.  After the core vitrectomies were done, the posterior hyaloid  was detached. The hyaloid was lifted anterior to the equator 360 degrees.  This freed up the intraocular lens without difficulty. A groove limbal  incision was made superiorly. The anterior chamber was then deepened with  Viscoelastic material. The anterior chamber wound was then opened and  forceps entry through the posterior approach was then used to bring the  intraocular lens and the vitreous cavity into the iris plane, and with a  second forceps, this half was grasped and the intraocular lens was  exteriorized and sent for gross identification in the pathological  laboratory.   At this time excellent view of the capsule was noted. Excellent peripheral  view was obtained. The Kuglen hook was then used to mobilize the iris so as  to confirm that there was excellent peripheral capsule support. At that  time  a model EZE-60 power 14 lens was then placed into the sulcus across the  anterior chamber with viscoelastic protecting the endothelium. Its placement  was then confirmed central and the haptic was placed in the horizontal  meridian. Excellent support and snapback was noted. Excellent centration was  noted. At this time the groove limbal wound was then closed with 2-0 nylon  interrupted sutures. The infusion sites were then closed with 7-0 Vicryl  suture. All sclerotomies were then closed after the infusion had been  removed. The conjunctiva was then closed with 7-0 Vicryl suture.  Subconjunctival injections of antibiotics were applied. Postoperatively  there were no complications.                                               Alford Highland Rankin, M.D.    GAR/MEDQ  D:  08/27/2002  T:  08/27/2002  Job:  [id number]

## 2010-12-25 NOTE — Discharge Summary (Signed)
NAME:  Jonathan [name], Jonathan [name]                         ACCOUNT NO.:  [id number]   MEDICAL RECORD NO.:  [id number]                   PATIENT TYPE:  INP   LOCATION:  2035                                 FACILITY:  [facility]   PHYSICIAN:  Evelene Croon, M.D.                  DATE OF BIRTH:  [birth year]   DATE OF ADMISSION:  01/22/2004  DATE OF DISCHARGE:  01/28/2004                                 DISCHARGE SUMMARY   DISCHARGE DIAGNOSES:  1. Abnormal stress test.  2. Severe three vessel coronary artery disease with left main disease and     total right coronary artery disease, status post coronary artery bypass     grafting x5 completed on January 24, 2004.  3. History of diabetes.  4. Hypertension.  5. Hyperlipidemia.  6. History of acute glomerular nephritis at age 69 with no residual renal     problems.  7. Status post herniorrhaphy in the past.  8. Status post bilateral cataract surgery.  9. History of hepatitis A and B.  10.      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   PROCEDURE:  1. Cardiac catheterization completed on January 22, 2004.  2. Coronary artery bypass grafting x5 completed on January 24, 2004.  3. Preoperative arterial evaluation completed on January 22, 2004, this     included bilateral carotid Duplex examination, ABI's, and palmar arch     tests.  These were all within normal limits.   CONSULTATIONS:  1. Cardiac rehab.  2. Case management.   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  The patient has a history of diabetes,  hypertension, hyperlipidemia, and completed a stress test in mid-June of  2005.  This was shown to be abnormal with 1 mm ST depression during stage II  of the Bruce protocol. There was downsloping with T wave inversion on the  EKG.  There was fairly prompt resolution post exercise, so that at 1 minute,  there was only minimal ST depression.  This  was certainly considered a  positive stress test in the early stage.  This was completed by E. Graceann Congress, M.D. and his impression was that the patient should undergo  coronary angiography for further evaluation.   HOSPITAL COURSE:  The patient was then admitted electively to [facility] and underwent cardiac catheterization on January 22, 2004.  This  showed normal left ventricular ejection fraction.  This also showed severe  three vessel coronary artery disease with left main and total right coronary  artery disease.  A CVTS consultation was initiated for evaluation for  surgical intervention.  Dr. Laneta Simmers of CVTS responded to the consultation  appropriately.  He saw and examined the patient on January 22, 2004.  His  impression was that  the patient did indeed have severe left main and three  vessel coronary artery disease with a positive stress test.  He felt that  the coronary artery bypass grafting was the best treatment for the patient  at this time.  He discussed the risks, benefits, and alternatives with the  patient and his family at that time.  They were in understanding and agreed  to proceed with surgery.  The patient underwent preoperative arterial  evaluation including bilateral carotid Duplex examination.  This showed no  evidence of internal carotid artery stenosis bilaterally.  The patient also  had bilateral palmar arch or Alan's tests completed which were within normal  limits.  His lower extremity ABI's were both greater than 1.0 which were  within normal limits.  The patient remained stable and free of chest pain  overnight.   The patient was taken to the operating room then on January 24, 2004, and  underwent coronary artery bypass grafting x5 utilizing the left internal  mammary artery to the left anterior descending artery, saphenous vein graft  to the first diagonal, saphenous vein graft to the distal right coronary  artery, and a sequential saphenous vein  graft to the obtuse marginal branch  #1 and obtuse marginal branch #2.  Overall, the patient tolerated this  procedure well and was transferred to the surgical intensive care unit in  critical, but stable condition.  The patient was extubated successfully  later that evening without any difficulty. He remained hemodynamically  stable with low chest tube output and good urine output.   On postoperative day #1, the patient was doing quite well.  He was  ambulating in the halls with assistance.  His chest tubes were discontinued  without difficulty.  His invasive lines were discontinued.  The patient  began a regular diet. He remained hemodynamically stable.  His oxygen  saturation was in the mid-90's on room air.  The patient was deemed  appropriate for transfer to the stepdown unit at that time.   Over the next several days, the patient made remarkable progress  postoperatively.  By the time of discharge planning on postoperative day #3  or January 27, 2004, the patient had resumed normal bowel and bladder function.  He was ambulating in the hallways without difficulty.  He was tolerating a  regular diet without nausea or vomiting.  He had remained afebrile.  His  blood pressure and heart rate and rhythm were stable.  He was saturating 93%  on room air.  His weight was 235 pounds which was up slightly from a  preoperative weight of 221 pounds.  His bedside glucose measurements were  258, 274, 197, and 247 within a 24-hour period.  The patient's heart was in  a regular rate and rhythm reading normal sinus rhythm on telemetry.  His  lung sounds revealed slightly diminished sounds in the bases, otherwise  clear.  His abdomen was soft, nontender, and nondistended with good bowel  sounds.  His extremities had trace pedal edema on the left greater than the  right.  Otherwise he had 2+ posterior tibial pulses bilaterally.  His sternum was stable and clean, dry, and intact.  His left lower extremity  had  slight ecchymosis from his saphenous vein graft harvest site. There was no  evidence of hematoma or infection.  The patient was doing quite well and  deemed appropriate for discharge planning, with anticipation of discharge on  January 28, 2004.   We will continue as planned  with discharge then on postoperative day #4 or  January 28, 2004, pending a.m. rounds and no change in the patient's clinical  status.   DISCHARGE MEDICATIONS:  1. Aspirin 325 mg daily.  2. Toprol XL 25 mg daily.  3. Lipitor 10 mg daily.  4. Synthroid 0.1 mg every other day.  5. Allopurinol 300 mg daily.  6. Colchicine 0.6 mg daily.  7. Glipizide 10 mg twice daily.  8. Metformin 1 gram twice daily.  9. Lasix 40 mg daily for seven days.  10.      Potassium 20 mEq daily for seven days.  11.      Tylox one to two tablets every four to six hours as needed for     pain.  12.      The patient is instructed to hold his Cardura and     lisinopril/hydrochlorothiazide until seen by E. Graceann Congress, M.D.   DISCHARGE INSTRUCTIONS:  1. The patient is to avoid driving.  He is to avoid heavy lifting or     strenuous activity.  He is to continue to walk daily.  He is to continue     his breathing exercises for one more week.  2. The patient is to follow a low fat, low salt, carbohydrate modified     medium calorie diet.  3. Wound care. The patient may shower.  He should wash his incisions daily     with soap and water.  He is to notify the CVTS Office if he has any     increased redness, swelling, or drainage from his incisions.  4. Special instructions.  The patient is given the cardiac surgery patient     and family education booklet for postoperative education at the time of     discharge.   FOLLOW UP:  The patient is to see his cardiologist, Dr. Corinda Gubler on February 18, 2004, at 4 p.m.  He is to obtain a chest x-ray at that time, of which he  will later take to his appointment with CVTS surgeon.  The patient is to see   Dr. Laneta Simmers within three to four weeks of discharge.  The CVTS office will  contact the patient with the exact date and time.      Carolyn A. Eustaquio Boyden.                  Evelene Croon, M.D.    CAF/MEDQ  D:  01/27/2004  T:  01/29/2004  Job:  [id number]   cc:   Cecil Cranker, M.D.   Jeffrey A. Tawanna Cooler, M.D. [facility]   Carole Binning, M.D. [facility]

## 2010-12-25 NOTE — Op Note (Signed)
NAME:  Jonathan [name], Jonathan [name]                         ACCOUNT NO.:  [id number]   MEDICAL RECORD NO.:  [id number]                   PATIENT TYPE:  INP   LOCATION:  2311                                 FACILITY:  [facility]   PHYSICIAN:  Evelene Croon, M.D.                  DATE OF BIRTH:  25-Oct-1935   DATE OF PROCEDURE:  01/24/2004  DATE OF DISCHARGE:                                 OPERATIVE REPORT   PREOPERATIVE DIAGNOSES:  Left main and severe three-vessel coronary artery  disease.   POSTOPERATIVE DIAGNOSES:  Left main and severe three-vessel coronary artery  disease.   OPERATION PERFORMED:  Median sternotomy, extracorporeal circulation,  coronary artery bypass grafting surgery times five using a left internal  mammary artery graft to the left anterior descending coronary artery, with a  saphenous vein graft to the diagonal branch of the left anterior descending,  a sequential saphenous vein graft to the first and second obtuse marginal  branches of the left circumflex coronary artery, and a saphenous vein graft  to the right coronary artery.  Endoscopic vein harvesting from the left leg.   SURGEON:  Alleen Borne, M.D.   ASSISTANT:  Rowe Clack, P.A.-C.   ANESTHESIA:  General endotracheal.   INDICATIONS FOR PROCEDURE:  The patient is a 75 year old gentleman with a  history of diabetes who presented with a greater than one year history of  progressive exertional fatigue and tiredness.  He underwent a stress test  which is positive for ischemia.  He had a cardiac catheterization performed  and this showed 80 to 90% eccentric left main stenosis.  The LAD had 60 to  70% proximal stenosis.  There was a large diagonal branch that had about 50%  stenosis.  The left circumflex had about 60% ostial stenosis and then a 90%  segmental stenosis before the take off of the marginal branch.  This large  marginal branch had about 50% proximal stenosis and then bifurcated into two  other moderate  sized branches.  The first one of these branches bifurcated  further into three smaller branches.  The right coronary artery was occluded  proximally with  bridging collaterals filling the distal vessel.  Left  ventricular ejection fraction was greater than 50% with no mitral  regurgitation.  There was no gradient across the aortic valve.   After review of the angiogram and examination of the patient, it was felt  that coronary artery bypass graft surgery was the best treatment.  I  discussed the operative procedure with the patient and his wife including  alternatives, benefits, and risks including bleeding, possible blood  transfusion, infection, stroke, myocardial infarction and death.  They  understood and agreed to proceed.   DESCRIPTION OF PROCEDURE:  The patient was taken to the operating room and  placed on the table in supine position.  After induction of general  endotracheal anesthesia, a Foley catheter  was placed in the bladder using  sterile technique.  Then the chest, abdomen and both lower extremities were  prepped and draped in the usual sterile manner.  The chest was entered  through a median sternotomy incision.  Examination of the heart showed good  ventricular contractility.  The ascending aorta had no palpable plaques in  it.   Then the left internal mammary artery was harvested from the chest wall as a  pedicle graft.  This was a medium caliber vessel with excellent blood flow  through it.  At the same time a segment of greater saphenous vein was  harvested from the left leg.  This vein was harvested using endoscopic vein  harvest technique.  It was of medium caliber and good quality.   Then the patient was heparinized and when an adequate activated clotting  time was achieved, the distal ascending aorta was cannulated using a 20  French aortic cannula for arterial inflow.  Venous outflow was achieved  using a two-stage venous cannula for the right atrial  appendage.  An  antegrade cardioplegia and vent cannula was inserted in the aortic root.   The patient was placed on cardiopulmonary bypass and the distal coronary  arteries identified.  The LAD was intramyocardial in its proximal and mid  portions and exited in its distal third to the surface of the heart.  The  vessel did have diffuse plaque in it posteriorly.  This posterior plaque  extended out into the distal vessel towards the apex.  The diagonal branch  was a large graftable vessel with minimal disease in it.  The first marginal  was a medium-sized graftable vessel.  The second marginal was also a medium-  sized graftable vessel.  The distal right coronary artery had a moderate  amount of plaque in it extending out as far as the distal vessel was  visible.  The posterior descending and posterolateral branches were all  small vessels.   Then the aorta was crossclamped and 500 mL of cold blood antegrade  cardioplegia was administered in the aortic root with quick arrest of the  heart.  Systemic hypothermia to 20 degrees centigrade and topical  hypothermia with iced saline was used.  A temperature probe was placed on  the septum and insulating pad in the pericardium.   The first distal anastomosis was performed to the distal right coronary  artery.  The internal diameter was greater than 2 mm.  Conduit used was a  segment of greater saphenous vein.  The anastomosis was performed in a end-  to-side manner using continuous 7-0 Prolene suture.  Flow was measured  through the graft and was excellent.   The second distal anastomosis was performed to the diagonal branch.  The  internal diameter was 1.6 mm.  The conduit used was a second segment of the  greater saphenous vein.  The anastomosis was performed in a end-to-side  manner using continuous 7-0 Prolene suture.  Flow was measured through the  graft and was excellent.  Then another dose of cardioplegia was given down the vein  grafts and into  the aortic root.  The third distal anastomosis was performed to the first  marginal branch.  The internal diameter was 1.6 mm.  The conduit used was a  third segment of the greater saphenous vein.  The anastomosis was performed  in a sequential side-to-side manner using continuous 7-0 Prolene suture.  Flow was measured through the graft and was excellent.   The  fourth distal anastomosis was performed to the second marginal branch.  The internal diameter of this vessel was 1.6 mm.  The conduit used was the  same segment of greater saphenous vein.  The anastomosis was performed in a  sequential end-to-side manner using continuous 7-0 Prolene suture.  Flow was  measured through the graft and was excellent.  Then another dose of  cardioplegia was given down this vein graft and in the aortic root.   The fifth distal anastomosis was performed to the distal portion of the left  anterior descending coronary artery.  The internal diameter was about 1.6 mm  distally.  The conduit used was the left internal mammary graft and this was  brought through an opening in the left pericardium anterior to the phrenic  nerve.  It was anastomosed to the LAD in an end-to-side manner using  continuous 8-0 Prolene suture.  The pedicle was tacked to the epicardium  with 6-0 Prolene sutures.  The patient was rewarmed to 37 degrees  centigrade.  Then with the cross-clamp in place, the three proximal vein  graft anastomoses were performed in end-to-side manner to the aortic root  using continuous 6-0 Prolene suture.  The clamp was then removed from the  mammary pedicle.  There was rapid warming of the ventricular septum and  return of spontaneous ventricular fibrillation.  The crossclamp was removed  with time of 95 minutes and the patient spontaneously converted to sinus  rhythm.   The proximal and distal anastomoses appeared hemostatic and the line of the  graft satisfactory.  Graft markers were  placed around the proximal  anastomoses.  Two temporary right ventricular and right atrial pacing wires  placed and brought out through the skin.   When the patient had rewarmed to 37 degrees centigrade, he was weaned from  cardiopulmonary bypass on no inotropic agents.  Total bypass time was 110  minutes.  Cardiac function appeared excellent with a cardiac output of 4.5 L  per minute.  Protamine was given and the venous and aortic cannulas were  removed without difficulty.  Hemostasis was achieved.  Three chest tubes  were placed with a tube in the posterior pericardium, one in the left  pleural space and one in the anterior mediastinum.  The pericardium was  easily approximated over the heart.  The sternum was closed with #6  stainless steel wires.  The fascia was closed with continuous #1 Vicryl  suture.  The subcutaneous tissue was closed with continuous 2-0 Vicryl and the skin with 3-0 Vicryl subcuticular closure.  The lower extremity vein  harvest site was closed in layers in a similar manner.  Sponge, needle and  instrument counts were correct according to the scrub nurse.  Dry sterile  dressings were applied over the incisions, around the chest tubes which were  hooked to Pleur-evac suction.  The patient remained hemodynamically stable  and was transported to the SICU in guarded but stable condition.                                               Evelene Croon, M.D.    BB/MEDQ  D:  01/24/2004  T:  01/26/2004  Job:  [id number]   cc:   Arturo Morton. Riley Kill, M.D. [facility]

## 2010-12-25 NOTE — Op Note (Signed)
Jonathan [name], Jonathan [name] NO.:  [id number]   MEDICAL RECORD NO.:  [id number]          PATIENT TYPE:  OIB   LOCATION:  2870                         FACILITY:  [facility]   PHYSICIAN:  Alford Highland. Rankin, M.D.   DATE OF BIRTH:  11/18/1935   DATE OF PROCEDURE:  07/13/2004  DATE OF DISCHARGE:  07/13/2004                                 OPERATIVE REPORT   POSTOPERATIVE DIAGNOSIS:  1.  Proliferative vitreoretinopathy, stage complex, status post complex      retinal detachment repair, OD.   POSTOPERATIVE DIAGNOSIS:  1.  Proliferative vitreoretinopathy, stage complex, status post complex      retinal detachment repair, OD.   PROCEDURES:  1.  Posterior vitrectomy with endolaser panphotocoagulation for retinopexy      purposes, OD.  2.  Removal of intraocular implant - posterior - silicon oil, OD.   SURGEON:  Alford Highland. Rankin, M.D.   ANESTHESIA:  Local retrobulbar monitored anesthesia control.   INDICATIONS FOR PROCEDURE:  The patient is a 76 year old man who underwent  complex retinal detachment repair and who is now for removal of silicon oil  and restoration of his best ambulatory vision.  The patient understands the  need for surgical intervention. He understands the risks of anesthesia,  including the rare occurrence of death, but also to the complications of the  eye including, but not limited to, hemorrhage, infection, scarring, the need  for further surgery, no change in vision, loss of vision and progressive  disease despite intervention. After appropriate signed consent was obtained,  he was taken to the operating room.   DESCRIPTION OF PROCEDURE:  In the operating room, appropriate monitoring was  obtained followed by mild sedation.  0.75% Marcaine was applied retrobulbar  to supply additional anesthesia laterally.  Modified VanLint.  The right  periocular region was then sterilely prepped and draped in the usual  ophthalmic fashion. A lid speculum was applied.  A  conjunctival peritomy was  then fashioned in each of the quadrants with the exception of the  inferonasal.  A silicon wall extraction was then carried out using the  extractor with the 18 gauge angiocath needle attached.  Oral removal was  uneventful.  Remnants of small and emulsified oil were identified and  removed as well.  Vitrectomy instrumentation was then used to remove and  aspirate small globules of emulsified silicon using a partial fluid  exchange.  At the end, air fluid exchange was then completed to leave fluid  in the eye.  Endolaser photocoagulation was placed one row posterior to the  previous retinopexy line so as to maintain retinal reattachment state.  No  complications occurred.  The superior sclerotomies were then closed with 7-0  Vicryl.  The infusion wound  was similarly closed with 7-0 Vicryl.  The conjunctiva was closed with 7-0  Vicryl.  The subconjunctival injection of Decadron was applied.  Topical  ointment was applied.  A sterile patch and Fox shield were applied.  The  patient tolerated the procedure without complications.       GAR/MEDQ  D:  07/13/2004  T:  07/13/2004  Job:  [id number]

## 2010-12-25 NOTE — Op Note (Signed)
NAME:  RANGEL, ECHEVERRI                         ACCOUNT NO.:  [id number]   MEDICAL RECORD NO.:  [id number]                   PATIENT TYPE:  INP   LOCATION:  5729                                 FACILITY:  [facility]   PHYSICIAN:  Alford Highland. Rankin, M.D.                DATE OF BIRTH:  02-05-1936   DATE OF PROCEDURE:  03/25/2004  DATE OF DISCHARGE:                                 OPERATIVE REPORT   PREOPERATIVE DIAGNOSES:  1. Tractional detachment, right eye secondary to  2. Proliferative vitreoretinopathy, stage C2, right eye.  3. Rhegmatogenous detachment recurrent, right eye, secondary to #1 and #2.   POSTOPERATIVE DIAGNOSIS:  1. Tractional detachment, right eye secondary to  2. Proliferative vitreoretinopathy, stage C2, right eye.  3. Rhegmatogenous detachment recurrent, right eye, secondary to #1 and #2.   OPERATION PERFORMED:  1. Posterior vitrectomy and membrane peel, right eye.  2. Endolaser panphotocoagulation, right eye, for retinopexy purposes.  3. Injection of vitreous substitute--silicone oil, 5000 centistokes,     permanent, right eye.   SURGEON:  Alford Highland. Rankin, M.D.   ANESTHESIA:  General endotracheal.   INDICATIONS FOR PROCEDURE:  The patient is a 75 year old man who has  profound visual field loss on the basis of recurrent retinal detachment  inferiorly in the right eye secondary to proliferative vitreoretinopathy,  progression of retinal detachment with new retinal breaks, nearly a giant  tear.  Previous attempted repair of inferior retinal detachment with scleral  buckle performed on March 18, 2004 that successfully initially repaired the  retinal detachment without problem; however, the patient has developed  progressive vitreous contraction leading to nearly a giant tear extending  from the 6:30 position to the 9 o'clock position with only a small portion  of the retina intact between the 6:30 and the 5:30 breaks.  The patient  understands that this is an attempt  to reattach the retina and understands  the possible need of vitreous substitute, but particularly the need of  silicone oil for use in the long term fashion to tamponade, and reattach and  maintain retinal reattachment to the right eye.  The patient understands the  risks of anesthesia including the rare occurrence of death but also to the  eye including but not limited to hemorrhage, infection, scarring, need for  another surgery, no change in vision, loss of vision and progression of  disease despite intervention.  After appropriate signed consent was  obtained, the patient was taken to the operating room.   DESCRIPTION OF PROCEDURE:  In the operating room, appropriate monitoring was  followed by sedations.  General endotracheal anesthesia was instituted  without difficulty.  The right periocular region was sterilely prepped and  draped in the usual ophthalmic fashion.  A lid speculum was applied.  Conjunctival peritomy was fashioned in each of the quadrants with the  exception of infranasal.  A 4 mm infusion secured 3.5 mm posterior  to the  limbus.  Placement in the vitreous cavity verified visually.  Superior  sclerotomies placed.  The microscope was placed in position with the Biom  attachment.  Core vitrectomy was then begun.  Dual proportional mode was  used so as to allow for vitreous base trimming with mobile retina  inferiorly.  The core vitrectomy was completed.  The vitreous base was then  trimmed 360 degrees.  The retinal detachment extended posterior to the  inferior temporal arcade and for this reason, it was judged best to drain  fluid internally.  The internal diathermy was then used to create a  retinotomy infranasal to the optic nerve and then fluid-fluid exchange was  then carried out.  Upon completion of the fluid-fluid exchange, this was  repeated numerous times over the next 20 minutes to confirm that all fluid  was removed from the subretinal space.  Endolaser  photocoagulation was  placed in the bed of the detachment posterior to the slope of the previous  buckle as well as eventually around the retinotomy to maintain the retinal  reattached state.  An air-silicone oil exchange was completed without  difficulty and superior sclerotomies had been closed with 7-0 Vicryl suture.  Appropriate oil fill was completed.  Intraocular pressure was assessed and  found to be appropriate.  Conjunctiva then closed with 7-0 Vicryl suture.  Infusion had also been previously removed and closed with 7-0 Vicryl.  Subconjunctival injection of antibiotic and steroid applied.  The patient  tolerated the procedure well without complication.                                               Alford Highland Rankin, M.D.    GAR/MEDQ  D:  03/25/2004  T:  03/26/2004  Job:  [id number]

## 2011-01-06 ENCOUNTER — Other Ambulatory Visit: Payer: Self-pay | Admitting: Family Medicine

## 2011-02-08 ENCOUNTER — Other Ambulatory Visit: Payer: Self-pay | Admitting: Family Medicine

## 2011-02-16 ENCOUNTER — Other Ambulatory Visit: Payer: Self-pay | Admitting: Family Medicine

## 2011-04-07 ENCOUNTER — Other Ambulatory Visit: Payer: Self-pay | Admitting: Family Medicine

## 2011-05-18 ENCOUNTER — Other Ambulatory Visit: Payer: Self-pay | Admitting: Family Medicine

## 2011-06-25 ENCOUNTER — Ambulatory Visit (INDEPENDENT_AMBULATORY_CARE_PROVIDER_SITE_OTHER): Payer: Medicare Other | Admitting: *Deleted

## 2011-06-25 DIAGNOSIS — Z23 Encounter for immunization: Secondary | ICD-10-CM

## 2011-08-14 ENCOUNTER — Other Ambulatory Visit: Payer: Self-pay | Admitting: Family Medicine

## 2011-08-16 ENCOUNTER — Other Ambulatory Visit: Payer: Self-pay | Admitting: Family Medicine

## 2011-08-18 ENCOUNTER — Other Ambulatory Visit: Payer: Self-pay | Admitting: Family Medicine

## 2011-09-30 ENCOUNTER — Other Ambulatory Visit: Payer: Medicare Other

## 2011-10-07 ENCOUNTER — Ambulatory Visit (INDEPENDENT_AMBULATORY_CARE_PROVIDER_SITE_OTHER): Payer: Medicare Other | Admitting: Family Medicine

## 2011-10-07 ENCOUNTER — Encounter: Payer: Self-pay | Admitting: Family Medicine

## 2011-10-07 DIAGNOSIS — N139 Obstructive and reflux uropathy, unspecified: Secondary | ICD-10-CM

## 2011-10-07 DIAGNOSIS — Z87898 Personal history of other specified conditions: Secondary | ICD-10-CM

## 2011-10-07 DIAGNOSIS — I10 Essential (primary) hypertension: Secondary | ICD-10-CM

## 2011-10-07 DIAGNOSIS — I2510 Atherosclerotic heart disease of native coronary artery without angina pectoris: Secondary | ICD-10-CM

## 2011-10-07 DIAGNOSIS — N138 Other obstructive and reflux uropathy: Secondary | ICD-10-CM

## 2011-10-07 DIAGNOSIS — M109 Gout, unspecified: Secondary | ICD-10-CM

## 2011-10-07 DIAGNOSIS — N059 Unspecified nephritic syndrome with unspecified morphologic changes: Secondary | ICD-10-CM

## 2011-10-07 DIAGNOSIS — N401 Enlarged prostate with lower urinary tract symptoms: Secondary | ICD-10-CM

## 2011-10-07 DIAGNOSIS — E119 Type 2 diabetes mellitus without complications: Secondary | ICD-10-CM | POA: Diagnosis not present

## 2011-10-07 DIAGNOSIS — E039 Hypothyroidism, unspecified: Secondary | ICD-10-CM

## 2011-10-07 DIAGNOSIS — E785 Hyperlipidemia, unspecified: Secondary | ICD-10-CM

## 2011-10-07 LAB — LIPID PANEL
Cholesterol: 125 mg/dL (ref 0–200)
HDL: 52.2 mg/dL (ref >39.00)
Triglycerides: 63 mg/dL (ref 0.0–149.0)

## 2011-10-07 LAB — BASIC METABOLIC PANEL
Chloride: 103 mEq/L (ref 96–112)
GFR: 87.79 mL/min (ref >60.00)
Potassium: 4.2 mEq/L (ref 3.5–5.1)
Sodium: 138 mEq/L (ref 135–145)

## 2011-10-07 LAB — CBC WITH DIFFERENTIAL/PLATELET
Basophils Absolute: 0 10*3/uL (ref 0.0–0.1)
Basophils Relative: 0.3 % (ref 0.0–3.0)
Eosinophils Absolute: 0.1 10*3/uL (ref 0.0–0.7)
Hemoglobin: 15.1 g/dL (ref 13.0–17.0)
Lymphocytes Relative: 21.7 % (ref 12.0–46.0)
MCHC: 33.8 g/dL (ref 30.0–36.0)
MCV: 93.8 fl (ref 78.0–100.0)
Monocytes Absolute: 0.7 10*3/uL (ref 0.1–1.0)
Neutro Abs: 3.9 10*3/uL (ref 1.4–7.7)
RBC: 4.76 Mil/uL (ref 4.22–5.81)
RDW: 14.6 % (ref 11.5–14.6)

## 2011-10-07 LAB — TSH: TSH: 1.31 u[IU]/mL (ref 0.35–5.50)

## 2011-10-07 LAB — MICROALBUMIN / CREATININE URINE RATIO
Creatinine,U: 111.8 mg/dL
Microalb, Ur: 1.4 mg/dL (ref 0.0–1.9)

## 2011-10-07 LAB — HEMOGLOBIN A1C: Hgb A1c MFr Bld: 6.8 % — ABNORMAL HIGH (ref 4.6–6.5)

## 2011-10-07 LAB — HEPATIC FUNCTION PANEL
ALT: 18 U/L (ref 0–53)
Albumin: 4.2 g/dL (ref 3.5–5.2)
Bilirubin, Direct: 0.2 mg/dL (ref 0.0–0.3)
Total Protein: 6.3 g/dL (ref 6.0–8.3)

## 2011-10-07 LAB — POCT URINALYSIS DIPSTICK
Blood, UA: NEGATIVE
Nitrite, UA: NEGATIVE
Spec Grav, UA: 1.025
Urobilinogen, UA: 0.2
pH, UA: 5.5

## 2011-10-07 MED ORDER — TERAZOSIN HCL 1 MG PO CAPS: 1.0000 mg | ORAL_CAPSULE | Freq: Every day | ORAL | Status: DC

## 2011-10-07 MED ORDER — SIMVASTATIN 40 MG PO TABS: 40.0000 mg | ORAL_TABLET | Freq: Every day | ORAL | Status: DC

## 2011-10-07 MED ORDER — GLIPIZIDE 10 MG PO TABS: 10.0000 mg | ORAL_TABLET | Freq: Two times a day (BID) | ORAL | Status: DC

## 2011-10-07 MED ORDER — FUROSEMIDE 20 MG PO TABS: 20.0000 mg | ORAL_TABLET | Freq: Every day | ORAL | Status: DC

## 2011-10-07 MED ORDER — BENAZEPRIL-HYDROCHLOROTHIAZIDE 20-25 MG PO TABS: 1.0000 | ORAL_TABLET | Freq: Every day | ORAL | Status: DC

## 2011-10-07 MED ORDER — METOPROLOL SUCCINATE ER 50 MG PO TB24: 50.0000 mg | ORAL_TABLET | Freq: Every day | ORAL | Status: DC

## 2011-10-07 MED ORDER — TAMSULOSIN HCL 0.4 MG PO CAPS: 0.4000 mg | ORAL_CAPSULE | Freq: Every day | ORAL | Status: DC

## 2011-10-07 MED ORDER — ALLOPURINOL 300 MG PO TABS: 300.0000 mg | ORAL_TABLET | Freq: Every day | ORAL | Status: DC

## 2011-10-07 MED ORDER — METFORMIN HCL 1000 MG PO TABS: 1000.0000 mg | ORAL_TABLET | Freq: Two times a day (BID) | ORAL | Status: DC

## 2011-10-07 MED ORDER — LEVOTHYROXINE SODIUM 100 MCG PO TABS: 100.0000 ug | ORAL_TABLET | ORAL | Status: DC

## 2011-10-07 MED ORDER — INSULIN GLARGINE 100 UNIT/ML ~~LOC~~ SOLN: 30.0000 [IU] | Freq: Every day | SUBCUTANEOUS | Status: DC

## 2011-10-07 NOTE — Progress Notes (Signed)
  Subjective:    Patient ID: KAYVON MO, male    DOB: [birth year], 76 y.o.   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 His blood. Pressure is 110/80, but sugar under good control, no episodes of gout on allopurinol. Cardiovascular-wise he feels well except for the last couple months he says shortness of breath and peripheral edema. On one occasion he self treated himself with 80 mg of Lasix and lost 5 pounds. He now takes the Lasix only intermittently.  He gets routine eye care, hearing normal, regular dental care, previous colonoscopy normal, vaccinations up-to-date tetanus 2012, Pneumovax x2, tetanus 2006, shingles 2009. Cognitive function normal walks on a daily basis, home health safety reviewed no issues identified, no guns in the house, he does have a health care power of attorney and living will.    Review of Systems  Respiratory: Positive for shortness of breath.   Cardiovascular: Positive for leg swelling.  Genitourinary: Positive for urgency and difficulty urinating.       Objective:   Physical Exam  Constitutional: He is oriented to person, place, and time. He appears well-developed and well-nourished.  HENT:  Head: Normocephalic and atraumatic.  Right Ear: External ear normal.  Left Ear: External ear normal.  Nose: Nose normal.  Mouth/Throat: Oropharynx is clear and moist.       Barely light perception right eye he had a torn retina unsuccessful repair  Eyes: Conjunctivae and EOM are normal. Pupils are equal, round, and reactive to light.  Neck: Normal range of motion. Neck supple. No JVD present. No tracheal deviation present. No thyromegaly present.  Cardiovascular: Normal rate, regular rhythm, normal heart sounds and intact distal pulses.  Exam reveals no gallop and no friction rub.   No  murmur heard.      1+ symmetrical peripheral edema  Pulmonary/Chest: Effort normal and breath sounds normal. No stridor. No respiratory distress. He has no wheezes. He has no rales. He exhibits no tenderness.  Abdominal: Soft. Bowel sounds are normal. He exhibits no distension and no mass. There is no tenderness. There is no rebound and no guarding.  Genitourinary: Rectum normal, prostate normal and penis normal. Guaiac negative stool. No penile tenderness.  Musculoskeletal: Normal range of motion. He exhibits no edema and no tenderness.  Lymphadenopathy:    He has no cervical adenopathy.  Neurological: He is alert and oriented to person, place, and time. He has normal reflexes. No cranial nerve deficit. He exhibits normal muscle tone.  Skin: Skin is warm and dry. No rash noted. No erythema. No pallor.  Psychiatric: He has a normal mood and affect. His behavior is normal. Judgment and thought content normal.          Assessment & Plan:  Coronary disease asymptomatic followup in the near future and cardiology  Diabetes the goal check labs  Hyperlipidemia continue on Zocor 40 daily check labs  BPH with outlet obstruction continue current meds  Hypothyroidism continue Synthroid  History of gout continue allopurinol  Hypertension continue Lotensin 20-12.5 and Toprol 50 mg dose one half tab daily  New symptom of fluid retention and shortness of breath start Lasix 20 daily electrolytes in one week cardiac eval ASAP probably early CHF

## 2011-10-07 NOTE — Patient Instructions (Signed)
Continue your current medications except remember to stay on a complete salt free diet take 20 mg of Lasix every morning followup in one week  Also daily blood pressure checks at home to be sure your blood pressure does not drop too low

## 2011-10-14 ENCOUNTER — Encounter: Payer: Self-pay | Admitting: Family Medicine

## 2011-10-14 ENCOUNTER — Ambulatory Visit (INDEPENDENT_AMBULATORY_CARE_PROVIDER_SITE_OTHER): Payer: Medicare Other | Admitting: Family Medicine

## 2011-10-14 DIAGNOSIS — I10 Essential (primary) hypertension: Secondary | ICD-10-CM

## 2011-10-14 DIAGNOSIS — E785 Hyperlipidemia, unspecified: Secondary | ICD-10-CM

## 2011-10-14 DIAGNOSIS — Z87898 Personal history of other specified conditions: Secondary | ICD-10-CM

## 2011-10-14 DIAGNOSIS — I2510 Atherosclerotic heart disease of native coronary artery without angina pectoris: Secondary | ICD-10-CM

## 2011-10-14 DIAGNOSIS — N059 Unspecified nephritic syndrome with unspecified morphologic changes: Secondary | ICD-10-CM

## 2011-10-14 DIAGNOSIS — M109 Gout, unspecified: Secondary | ICD-10-CM

## 2011-10-14 DIAGNOSIS — E039 Hypothyroidism, unspecified: Secondary | ICD-10-CM

## 2011-10-14 DIAGNOSIS — E119 Type 2 diabetes mellitus without complications: Secondary | ICD-10-CM

## 2011-10-14 LAB — BASIC METABOLIC PANEL
BUN: 17 mg/dL (ref 6–23)
CO2: 25 mEq/L (ref 19–32)
Calcium: 9.1 mg/dL (ref 8.4–10.5)
Chloride: 102 mEq/L (ref 96–112)
Creatinine, Ser: 1 mg/dL (ref 0.4–1.5)
Glucose, Bld: 127 mg/dL — ABNORMAL HIGH (ref 70–99)

## 2011-10-14 MED ORDER — SIMVASTATIN 40 MG PO TABS: 40.0000 mg | ORAL_TABLET | Freq: Every day | ORAL | Status: DC

## 2011-10-14 MED ORDER — NITROGLYCERIN 0.4 MG SL SUBL: 0.4000 mg | SUBLINGUAL_TABLET | SUBLINGUAL | Status: DC | PRN

## 2011-10-14 MED ORDER — METOPROLOL SUCCINATE ER 50 MG PO TB24: 50.0000 mg | ORAL_TABLET | Freq: Every day | ORAL | Status: DC

## 2011-10-14 MED ORDER — PIOGLITAZONE HCL 45 MG PO TABS: 45.0000 mg | ORAL_TABLET | Freq: Every day | ORAL | Status: DC

## 2011-10-14 NOTE — Patient Instructions (Signed)
Continue the salt free diet and the 20 mg of Lasix daily  I will call you I gets her lab work back in today  Then we will have a mother discussion after your cardiac eval

## 2011-10-14 NOTE — Progress Notes (Signed)
  Subjective:    Patient ID: Jonathan [name], male    DOB: June 27, 1936, 76 y.o.   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 He also is experiencing some shortness of breath with exertion. No chest pain. However he had underlying coronary disease with no chest pain in the past. He's had bypass surgery  We started him on Lasix 20 mg a day and he says over the past week he's lost about 4 pounds and the peripheral edema is gone. He feels better his exercise tolerance has improved. BP 108/70 however he is not lightheaded when he stands up. Blood pressure the same now as it was prior to starting the Lasix.   Review of Systems    general and cardiovascular review of systems otherwise negative Objective:   Physical Exam Well-developed well-nourished male in no acute distress lungs are clear no crackles trace peripheral edema       Assessment & Plan:  Underlying coronary disease status post bypass surgery with peripheral edema question early CHF plan,,,,,,,,,,,, continue salt restriction daily Lasix 20 mg daily cardiac evaluations AP

## 2011-10-21 NOTE — Progress Notes (Signed)
Quick Note:  Left a message for pt to return call. ______ 

## 2011-10-25 ENCOUNTER — Encounter: Payer: Self-pay | Admitting: Internal Medicine

## 2011-10-25 ENCOUNTER — Ambulatory Visit (INDEPENDENT_AMBULATORY_CARE_PROVIDER_SITE_OTHER): Payer: Medicare Other | Admitting: Internal Medicine

## 2011-10-25 VITALS — BP 108/70 | HR 70 | Resp 18 | Ht 74.0 in | Wt 242.4 lb

## 2011-10-25 DIAGNOSIS — I10 Essential (primary) hypertension: Secondary | ICD-10-CM

## 2011-10-25 DIAGNOSIS — R609 Edema, unspecified: Secondary | ICD-10-CM

## 2011-10-25 DIAGNOSIS — T563X1A Toxic effect of cadmium and its compounds, accidental (unintentional), initial encounter: Secondary | ICD-10-CM

## 2011-10-25 DIAGNOSIS — I2510 Atherosclerotic heart disease of native coronary artery without angina pectoris: Secondary | ICD-10-CM

## 2011-10-25 NOTE — Patient Instructions (Signed)
Your physician has requested that you have an echocardiogram. Echocardiography is a painless test that uses sound waves to create images of your heart. It provides your doctor with information about the size and shape of your heart and how well your heart's chambers and valves are working. This procedure takes approximately one hour. There are no restrictions for this procedure.   Your physician has requested that you have a lexiscan myoview. For further information please visit [email or web address] Please follow instruction sheet, as given.    Will schedule a follow up based on test results  I will call you after test results

## 2011-10-25 NOTE — Assessment & Plan Note (Signed)
Doing very well s/p CABG 2005. Given exertional SOB, I have recommended lexiscan myoview at this time.  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 If low risk myoview, continue medical therapy. Echo Continue ASA, metoprolol, and statin Recent LFTs/ Lipids were reviewed

## 2011-10-25 NOTE — Assessment & Plan Note (Signed)
Weight gain, SOB and edema are likely fluid retention related to frequent NSAIDs and increased PO salt intake. Avoid NSAIDs as able. 2 gram sodium diet. Consider stopping hctz and continuing lasix long term (will defer to Dr Tawanna Cooler)  We will obtain an echo to evaluate for cardiac causes.

## 2011-10-25 NOTE — Assessment & Plan Note (Signed)
Stable No change required today  

## 2011-10-25 NOTE — Progress Notes (Signed)
Primary Care Physician: Evette Georges, MD, MD Cardiologist:  Dr Clementeen Hoof is a 76 y.o. male with a h/o CAD s/p CABG 2005 who presents today for cardiology follow-up.  He presented for routine evaluation in 2005 and had an abnormal stress test.    He had a cath which revealed severe three-vessel coronary artery disease characterized by significant  left main disease and chronic total occlusion of the right coronary artery.  He underwent CABG by Dr Laneta Simmers 01/2004.  He has done very well since.  He had a GXT by Dr Ladona Ridgel 2010 which revealed some ST depression.  He was treated medically with plans for repeat myoview upon return.  He did not return thereafter. He recently presented to Dr Nelida Meuse office with weight gain.  He was SOB with moderate activity and also complained of edema.  He was placed on low dose lasix with significant improvement in these symptoms.   Today, he denies symptoms of palpitations, chest pain,  orthopnea, PND, dizziness, presyncope, syncope, or neurologic sequela. The patient is tolerating medications without difficulties and is otherwise without complaint today.   Past Medical History  Diagnosis Date  . CAD (coronary artery disease)     CABG 2005 by Dr Laneta Simmers  . Diabetes mellitus   . History of hepatitis B   . Hyperlipidemia   . Hypertension   . Hypothyroidism   . Hepatitis A 1950  . Sun-damaged skin   . Benign prostatic hypertrophy   . Gout   . Glomerulonephritis acute     at age 34, due to beta strep, resolved  . DJD (degenerative joint disease)    Past Surgical History  Procedure Date  . Coranary artery bypass graft, bilateral 01/23/04    Dr Laneta Simmers  . Inguinal hernia repair     right  . Umbilical hernia repair   . Mastoidectomy 1943    R side  . Quadricept repair 2009    R side    Current Outpatient Prescriptions  Medication Sig Dispense Refill  . allopurinol (ZYLOPRIM) 300 MG tablet Take 1 tablet (300 mg total) by mouth daily.  100  tablet  3  . aspirin 325 MG tablet Take 325 mg by mouth daily.        . benazepril-hydrochlorthiazide (LOTENSIN HCT) 20-25 MG per tablet Take 1 tablet by mouth daily.  100 tablet  3  . colchicine 0.6 MG tablet Take 0.6 mg by mouth daily.        [name] Kitchen docusate sodium (COLACE) 100 MG capsule Take 100 mg by mouth 2 (two) times daily.        . furosemide (LASIX) 20 MG tablet Take 1 tablet (20 mg total) by mouth daily.  100 tablet  3  . glipiZIDE (GLUCOTROL) 10 MG tablet Take 1 tablet (10 mg total) by mouth 2 (two) times daily.  200 tablet  3  . glucose blood (FREESTYLE TEST STRIPS) test strip Use as instructed  200 each  3  . insulin glargine (LANTUS) 100 UNIT/ML injection Inject 30 Units into the skin at bedtime.  10 mL  6  . levothyroxine (SYNTHROID, LEVOTHROID) 100 MCG tablet Take 1 tablet (100 mcg total) by mouth every morning.  100 tablet  3  . metFORMIN (GLUCOPHAGE) 1000 MG tablet Take 1 tablet (1,000 mg total) by mouth 2 (two) times daily with a meal.  200 tablet  4  . metoprolol succinate (TOPROL-XL) 50 MG 24 hr tablet Take 1 tablet (50 mg total) by  mouth daily. Take with or immediately following a meal.  45 tablet  3  . Multiple Vitamin (MULTIVITAMIN PO) Take 1 tablet by mouth daily.        . naproxen (NAPROSYN) 250 MG tablet Take 250 mg by mouth 2 (two) times daily with a meal.        . niacin 100 MG tablet Take 100 mg by mouth daily.        . nitroGLYCERIN (NITROSTAT) 0.4 MG SL tablet Place 1 tablet (0.4 mg total) under the tongue as needed.  25 tablet  1  . Omega-3 Fatty Acids 350 MG CAPS Take 2 capsules by mouth daily.        . pioglitazone (ACTOS) 45 MG tablet Take 1 tablet (45 mg total) by mouth daily.  90 tablet  4  . polycarbophil (FIBERCON) 625 MG tablet Take 625 mg by mouth daily.        [name] Kitchen pyridOXINE (VITAMIN B-6) 50 MG tablet Take 50 mg by mouth daily.        . simvastatin (ZOCOR) 40 MG tablet Take 1 tablet (40 mg total) by mouth at bedtime.  100 tablet  3  . Tamsulosin HCl (FLOMAX)  0.4 MG CAPS Take 1 capsule (0.4 mg total) by mouth at bedtime.  100 capsule  3  . terazosin (HYTRIN) 1 MG capsule Take 1 capsule (1 mg total) by mouth at bedtime.  100 capsule  3    No Known Allergies  History   Social History  . Marital Status: Married    Spouse Name: N/A    Number of Children: N/A  . Years of Education: N/A   Occupational History  . retired    Social History Main Topics  . Smoking status: Former Games developer  . Smokeless tobacco: Not on file   Comment: smoked for 10 years, quit 1967  . Alcohol Use: Yes     1 2 oz shots of vodka per night  . Drug Use: No  . Sexually Active: Not on file   Other Topics Concern  . Not on file   Social History Narrative   Pt lives in [place].  Retired Designer, industrial/product (retired 2005)    Family History  Problem Relation Age of Onset  . Diabetes Other     1st degree relative  . 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   Head- normocephalic, atraumatic Eyes-  Sclera clear, conjunctiva pink Ears- hearing aid in place Oropharynx- clear Neck- supple, no JVP Lungs- Clear to ausculation bilaterally, normal work of breathing Heart- Regular rate and rhythm, no murmurs, rubs or gallops, PMI not laterally displaced GI- soft, NT, ND, + BS Extremities- no clubbing, cyanosis, or edema MS- no significant deformity or atrophy Skin- no rash or lesion Psych- euthymic mood, full affect Neuro- strength and sensation are intact  EKG 09/15/10- sinus rhythm  58 bpm, normal ekg  Assessment and Plan:

## 2011-11-01 ENCOUNTER — Ambulatory Visit (HOSPITAL_COMMUNITY): Payer: Medicare Other | Attending: Internal Medicine | Admitting: Radiology

## 2011-11-01 VITALS — BP 126/79 | Ht 74.5 in | Wt 241.0 lb

## 2011-11-01 DIAGNOSIS — Z9861 Coronary angioplasty status: Secondary | ICD-10-CM | POA: Insufficient documentation

## 2011-11-01 DIAGNOSIS — Z794 Long term (current) use of insulin: Secondary | ICD-10-CM | POA: Insufficient documentation

## 2011-11-01 DIAGNOSIS — R5383 Other fatigue: Secondary | ICD-10-CM | POA: Insufficient documentation

## 2011-11-01 DIAGNOSIS — I10 Essential (primary) hypertension: Secondary | ICD-10-CM

## 2011-11-01 DIAGNOSIS — R0609 Other forms of dyspnea: Secondary | ICD-10-CM | POA: Insufficient documentation

## 2011-11-01 DIAGNOSIS — E119 Type 2 diabetes mellitus without complications: Secondary | ICD-10-CM | POA: Insufficient documentation

## 2011-11-01 DIAGNOSIS — I2510 Atherosclerotic heart disease of native coronary artery without angina pectoris: Secondary | ICD-10-CM | POA: Diagnosis not present

## 2011-11-01 DIAGNOSIS — R5381 Other malaise: Secondary | ICD-10-CM | POA: Insufficient documentation

## 2011-11-01 DIAGNOSIS — E785 Hyperlipidemia, unspecified: Secondary | ICD-10-CM | POA: Insufficient documentation

## 2011-11-01 DIAGNOSIS — E663 Overweight: Secondary | ICD-10-CM | POA: Insufficient documentation

## 2011-11-01 DIAGNOSIS — T563X1A Toxic effect of cadmium and its compounds, accidental (unintentional), initial encounter: Secondary | ICD-10-CM

## 2011-11-01 DIAGNOSIS — Z87891 Personal history of nicotine dependence: Secondary | ICD-10-CM | POA: Insufficient documentation

## 2011-11-01 DIAGNOSIS — Z8249 Family history of ischemic heart disease and other diseases of the circulatory system: Secondary | ICD-10-CM | POA: Insufficient documentation

## 2011-11-01 DIAGNOSIS — R0989 Other specified symptoms and signs involving the circulatory and respiratory systems: Secondary | ICD-10-CM | POA: Insufficient documentation

## 2011-11-01 MED ORDER — REGADENOSON 0.4 MG/5ML IV SOLN
0.4000 mg | Freq: Once | INTRAVENOUS | Status: AC
  Administered 2011-11-01: 0.4 mg via INTRAVENOUS

## 2011-11-01 MED ORDER — TECHNETIUM TC 99M TETROFOSMIN IV KIT
33.0000 | PACK | Freq: Once | INTRAVENOUS | Status: AC | PRN
  Administered 2011-11-01: 33 via INTRAVENOUS

## 2011-11-01 MED ORDER — TECHNETIUM TC 99M TETROFOSMIN IV KIT
10.6000 | PACK | Freq: Once | INTRAVENOUS | Status: AC | PRN
  Administered 2011-11-01: 11 via INTRAVENOUS

## 2011-11-01 NOTE — Progress Notes (Signed)
[facility] 3 NUCLEAR MED [address] [place] [address] [phone]  Cardiology Nuclear Med Study  Jonathan [name] is a 76 y.o. male     MRN : [id number]     DOB: 09/21/1935  Procedure Date: 11/01/2011  Nuclear Med Background Indication for Stress Test:  Evaluation for Ischemia and Graft Patency History:  '05 CABG; '06 [id number] thinning, no ischemia, EF=60%; '10 [id number], medical tx. Cardiac Risk Factors: Family History - CAD, History of Smoking, Hypertension, IDDM Type 2, Lipids and Overweight  Symptoms:  DOE and Fatigue   Nuclear Pre-Procedure Caffeine/Decaff Intake:  7:00pm NPO After: 11:00pm   Lungs:  clear O2 Sat: 98% on room air. IV 0.9% NS with Angio Cath:  22g  IV Site: R Hand x 1, tolerated well IV Started by:  Irean Hong, RN  Chest Size (in):  46 Cup Size: n/a  Height: 6' 2.5" (1.892 m)  Weight:  241 lb (109.317 kg)  BMI:  Body mass index is 30.53 kg/(m^2). Tech Comments:  No diabetic medication today,FBS was 82 at 6:30 am today per patient. Toprol not held, per pt.    Nuclear Med Study 1 or 2 day study: 1 day  Stress Test Type:  Eugenie Birks  Reading MD: Dietrich Pates, MD  Order Authorizing Provider:  Hillis Range, MD  Resting Radionuclide: Technetium 2m Tetrofosmin  Resting Radionuclide Dose: 10.6 mCi   Stress Radionuclide:  Technetium 25m Tetrofosmin  Stress Radionuclide Dose: 33.0 mCi           Stress Protocol Rest HR: 61 Stress HR: 85  Rest BP: 126/79 Stress BP: 125/77  Exercise Time (min): n/a METS: n/a   Predicted Max HR: 147 bpm % Max HR: 57.82 bpm Rate Pressure Product: 44010   Dose of Adenosine (mg):  n/a Dose of Lexiscan: 0.4 mg  Dose of Atropine (mg): n/a Dose of Dobutamine: n/a mcg/kg/min (at max HR)  Stress Test Technologist: Smiley Houseman, CMA-N  Nuclear Technologist:  Domenic Polite, CNMT     Rest Procedure:  Myocardial perfusion imaging was performed at rest 45 minutes following the intravenous  administration of Technetium 36m Tetrofosmin.  Rest ECG: 1st degree AVB.  Stress Procedure:  The patient received IV Lexiscan 0.4 mg over 15-seconds.  Technetium 42m Tetrofosmin injected at 30-seconds.  There were nonspecific T-wave changes with Lexiscan.  Quantitative spect images were obtained after a 45 minute delay.  Stress ECG: No significant change from baseline ECG  QPS Raw Data Images:  Images were motion corrected.  Soft tissue (diaphragm) underlies inferior surface of heart. Stress Images:  Normal homogeneous uptake in all areas of the myocardium. Rest Images:  Normal homogeneous uptake in all areas of the myocardium. Subtraction (SDS):  No evidence of ischemia. Transient Ischemic Dilatation (Normal <1.22):  1.02 Lung/Heart Ratio (Normal <0.45):  0.33  Quantitative Gated Spect Images QGS EDV:  110 ml QGS ESV:  41 ml  Impression Exercise Capacity:  Lexiscan with no exercise. BP Response:  Normal blood pressure response. Clinical Symptoms:  No significant symptoms noted. ECG Impression:  No significant ST segment change suggestive of ischemia. Comparison with Prior Nuclear Study: No ischemia on previous scan.  Overall Impression:  Normal stress nuclear study.  LV Ejection Fraction: 63%.  LV Wall Motion:  NL LV Function; NL Wall Motion

## 2011-11-02 ENCOUNTER — Ambulatory Visit (HOSPITAL_COMMUNITY): Payer: Medicare Other | Attending: Cardiology

## 2011-11-02 ENCOUNTER — Other Ambulatory Visit: Payer: Self-pay

## 2011-11-02 DIAGNOSIS — I10 Essential (primary) hypertension: Secondary | ICD-10-CM | POA: Insufficient documentation

## 2011-11-02 DIAGNOSIS — E785 Hyperlipidemia, unspecified: Secondary | ICD-10-CM | POA: Insufficient documentation

## 2011-11-02 DIAGNOSIS — I080 Rheumatic disorders of both mitral and aortic valves: Secondary | ICD-10-CM | POA: Insufficient documentation

## 2011-11-02 DIAGNOSIS — E119 Type 2 diabetes mellitus without complications: Secondary | ICD-10-CM | POA: Insufficient documentation

## 2011-11-02 DIAGNOSIS — R0989 Other specified symptoms and signs involving the circulatory and respiratory systems: Secondary | ICD-10-CM | POA: Diagnosis not present

## 2011-11-02 DIAGNOSIS — R0609 Other forms of dyspnea: Secondary | ICD-10-CM | POA: Diagnosis not present

## 2011-11-02 DIAGNOSIS — I2510 Atherosclerotic heart disease of native coronary artery without angina pectoris: Secondary | ICD-10-CM | POA: Diagnosis not present

## 2011-11-02 DIAGNOSIS — Z87891 Personal history of nicotine dependence: Secondary | ICD-10-CM | POA: Insufficient documentation

## 2011-11-02 DIAGNOSIS — R609 Edema, unspecified: Secondary | ICD-10-CM | POA: Diagnosis not present

## 2011-11-02 DIAGNOSIS — T563X1A Toxic effect of cadmium and its compounds, accidental (unintentional), initial encounter: Secondary | ICD-10-CM

## 2011-11-06 ENCOUNTER — Other Ambulatory Visit: Payer: Self-pay | Admitting: Family Medicine

## 2011-11-29 ENCOUNTER — Other Ambulatory Visit: Payer: Self-pay | Admitting: *Deleted

## 2011-11-29 MED ORDER — INSULIN GLARGINE 100 UNIT/ML ~~LOC~~ SOLN: 30.0000 [IU] | Freq: Every day | SUBCUTANEOUS | Status: DC

## 2012-02-25 ENCOUNTER — Other Ambulatory Visit: Payer: Self-pay | Admitting: *Deleted

## 2012-02-25 MED ORDER — TERAZOSIN HCL 1 MG PO CAPS: 1.0000 mg | ORAL_CAPSULE | Freq: Every day | ORAL | Status: DC

## 2012-06-30 ENCOUNTER — Ambulatory Visit (INDEPENDENT_AMBULATORY_CARE_PROVIDER_SITE_OTHER): Payer: Medicare Other | Admitting: *Deleted

## 2012-06-30 DIAGNOSIS — Z23 Encounter for immunization: Secondary | ICD-10-CM

## 2012-07-25 ENCOUNTER — Other Ambulatory Visit: Payer: Self-pay | Admitting: *Deleted

## 2012-07-25 DIAGNOSIS — E119 Type 2 diabetes mellitus without complications: Secondary | ICD-10-CM

## 2012-07-25 MED ORDER — GLUCOSE BLOOD VI STRP: 1.0000 | ORAL_STRIP | Freq: Two times a day (BID) | Status: DC

## 2012-10-10 ENCOUNTER — Encounter: Payer: Medicare Other | Admitting: Family Medicine

## 2012-10-16 ENCOUNTER — Telehealth: Payer: Self-pay | Admitting: Family Medicine

## 2012-10-16 NOTE — Telephone Encounter (Signed)
Okay to schedule per Dr Todd 

## 2012-10-16 NOTE — Telephone Encounter (Signed)
Pt is on the bump because he had an 8:30 cpx on 11/30/12 ... His wife is scheduled to come in the same day at 9:30 and would like to know if he can be squeezed in with her so they do not have to make 2 trips to the office. Please advise

## 2012-10-17 NOTE — Telephone Encounter (Signed)
Pt is aware.  

## 2012-11-14 ENCOUNTER — Other Ambulatory Visit: Payer: Self-pay | Admitting: Family Medicine

## 2012-11-30 ENCOUNTER — Ambulatory Visit (INDEPENDENT_AMBULATORY_CARE_PROVIDER_SITE_OTHER): Payer: Medicare Other | Admitting: Family Medicine

## 2012-11-30 ENCOUNTER — Encounter: Payer: Self-pay | Admitting: Family Medicine

## 2012-11-30 VITALS — BP 120/80 | Temp 98.6°F | Wt 235.0 lb

## 2012-11-30 DIAGNOSIS — Z8619 Personal history of other infectious and parasitic diseases: Secondary | ICD-10-CM

## 2012-11-30 DIAGNOSIS — E119 Type 2 diabetes mellitus without complications: Secondary | ICD-10-CM

## 2012-11-30 DIAGNOSIS — E039 Hypothyroidism, unspecified: Secondary | ICD-10-CM | POA: Diagnosis not present

## 2012-11-30 DIAGNOSIS — M25562 Pain in left knee: Secondary | ICD-10-CM

## 2012-11-30 DIAGNOSIS — N401 Enlarged prostate with lower urinary tract symptoms: Secondary | ICD-10-CM

## 2012-11-30 DIAGNOSIS — M109 Gout, unspecified: Secondary | ICD-10-CM

## 2012-11-30 DIAGNOSIS — I2510 Atherosclerotic heart disease of native coronary artery without angina pectoris: Secondary | ICD-10-CM

## 2012-11-30 DIAGNOSIS — I10 Essential (primary) hypertension: Secondary | ICD-10-CM

## 2012-11-30 DIAGNOSIS — M25569 Pain in unspecified knee: Secondary | ICD-10-CM

## 2012-11-30 DIAGNOSIS — M25561 Pain in right knee: Secondary | ICD-10-CM | POA: Insufficient documentation

## 2012-11-30 DIAGNOSIS — M60261 Foreign body granuloma of soft tissue, not elsewhere classified, right lower leg: Secondary | ICD-10-CM

## 2012-11-30 DIAGNOSIS — M6020 Foreign body granuloma of soft tissue, not elsewhere classified, unspecified site: Secondary | ICD-10-CM

## 2012-11-30 DIAGNOSIS — L568 Other specified acute skin changes due to ultraviolet radiation: Secondary | ICD-10-CM

## 2012-11-30 DIAGNOSIS — Z0000 Encounter for general adult medical examination without abnormal findings: Secondary | ICD-10-CM

## 2012-11-30 DIAGNOSIS — L570 Actinic keratosis: Secondary | ICD-10-CM | POA: Diagnosis not present

## 2012-11-30 DIAGNOSIS — N138 Other obstructive and reflux uropathy: Secondary | ICD-10-CM

## 2012-11-30 DIAGNOSIS — Z87898 Personal history of other specified conditions: Secondary | ICD-10-CM

## 2012-11-30 DIAGNOSIS — R339 Retention of urine, unspecified: Secondary | ICD-10-CM | POA: Diagnosis not present

## 2012-11-30 DIAGNOSIS — E785 Hyperlipidemia, unspecified: Secondary | ICD-10-CM | POA: Diagnosis not present

## 2012-11-30 DIAGNOSIS — N059 Unspecified nephritic syndrome with unspecified morphologic changes: Secondary | ICD-10-CM

## 2012-11-30 LAB — BASIC METABOLIC PANEL
Calcium: 9.4 mg/dL (ref 8.4–10.5)
GFR: 85.33 mL/min (ref >60.00)
Potassium: 4.4 mEq/L (ref 3.5–5.1)
Sodium: 138 mEq/L (ref 135–145)

## 2012-11-30 LAB — TSH: TSH: 1.17 u[IU]/mL (ref 0.35–5.50)

## 2012-11-30 LAB — POCT URINALYSIS DIPSTICK
Bilirubin, UA: NEGATIVE
Blood, UA: NEGATIVE
Glucose, UA: NEGATIVE
Spec Grav, UA: 1.025
pH, UA: 5.5

## 2012-11-30 LAB — LIPID PANEL
LDL Cholesterol: 78 mg/dL (ref 0–99)
Total CHOL/HDL Ratio: 3

## 2012-11-30 LAB — HEPATIC FUNCTION PANEL
AST: 27 U/L (ref 0–37)
Albumin: 4.1 g/dL (ref 3.5–5.2)
Alkaline Phosphatase: 46 U/L (ref 39–117)
Total Bilirubin: 1 mg/dL (ref 0.3–1.2)

## 2012-11-30 LAB — MICROALBUMIN / CREATININE URINE RATIO
Microalb Creat Ratio: 0.6 mg/g (ref 0.0–30.0)
Microalb, Ur: 0.7 mg/dL (ref 0.0–1.9)

## 2012-11-30 LAB — CBC WITH DIFFERENTIAL/PLATELET
Basophils Absolute: 0 10*3/uL (ref 0.0–0.1)
Basophils Relative: 0.3 % (ref 0.0–3.0)
Eosinophils Absolute: 0.1 10*3/uL (ref 0.0–0.7)
HCT: 45.3 % (ref 39.0–52.0)
Hemoglobin: 15.4 g/dL (ref 13.0–17.0)
Lymphocytes Relative: 22.9 % (ref 12.0–46.0)
Lymphs Abs: 1.1 10*3/uL (ref 0.7–4.0)
MCHC: 34 g/dL (ref 30.0–36.0)
Monocytes Relative: 10.8 % (ref 3.0–12.0)
Neutro Abs: 3.2 10*3/uL (ref 1.4–7.7)
RBC: 4.84 Mil/uL (ref 4.22–5.81)
RDW: 14.2 % (ref 11.5–14.6)

## 2012-11-30 MED ORDER — LEVOTHYROXINE SODIUM 100 MCG PO TABS: 100.0000 ug | ORAL_TABLET | ORAL | Status: DC

## 2012-11-30 MED ORDER — NITROGLYCERIN 0.4 MG SL SUBL: 0.4000 mg | SUBLINGUAL_TABLET | SUBLINGUAL | Status: DC | PRN

## 2012-11-30 MED ORDER — METFORMIN HCL 1000 MG PO TABS: 1000.0000 mg | ORAL_TABLET | Freq: Two times a day (BID) | ORAL | Status: DC

## 2012-11-30 MED ORDER — ALLOPURINOL 300 MG PO TABS: 300.0000 mg | ORAL_TABLET | Freq: Every day | ORAL | Status: DC

## 2012-11-30 MED ORDER — SIMVASTATIN 40 MG PO TABS: 40.0000 mg | ORAL_TABLET | Freq: Every day | ORAL | Status: DC

## 2012-11-30 MED ORDER — BENAZEPRIL-HYDROCHLOROTHIAZIDE 20-25 MG PO TABS: 1.0000 | ORAL_TABLET | Freq: Every day | ORAL | Status: DC

## 2012-11-30 MED ORDER — PIOGLITAZONE HCL 45 MG PO TABS: ORAL_TABLET | ORAL | Status: DC

## 2012-11-30 MED ORDER — GLIPIZIDE 10 MG PO TABS: 10.0000 mg | ORAL_TABLET | Freq: Two times a day (BID) | ORAL | Status: DC

## 2012-11-30 MED ORDER — METOPROLOL SUCCINATE ER 50 MG PO TB24: 50.0000 mg | ORAL_TABLET | Freq: Every day | ORAL | Status: DC

## 2012-11-30 MED ORDER — INSULIN GLARGINE 100 UNIT/ML ~~LOC~~ SOLN: 30.0000 [IU] | Freq: Every day | SUBCUTANEOUS | Status: DC

## 2012-11-30 NOTE — Patient Instructions (Addendum)
Continue your current medications  Followup in urology and orthopedics at your convenience  Labs today  I will call you the report and discuss followup  Keep the dressing clean and dry for 2 days.  On Saturday morning remove the dressing clean it with peroxide and covered with a Telfa pad and use the Coban and so we don't put any tape on your skin . Do this twice daily for a week or 2 until it heals

## 2012-11-30 NOTE — Progress Notes (Signed)
Subjective:    Patient ID: Jonathan [name], male    DOB: January 24, 1936, 77 y.o.   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 That was treated by Dr. Luciana Axe. He's due to go back to see Dr. Nile Riggs for routine eye care, regular dental care, cardiac evaluation last year showed no evidence of obstructive coronary disease  Vaccinations up-to-date  Cognitive function normal he exercises on a regular basis. He's working with a trainer because of the severe pain and limitations of both knees. He will need total knee replacement soon. Home health safety reviewed no issues identified, no guns in the house, he does have a health care power of attorney and living well  His urinary flow continues to diminish. He's taken one Hytrin tablet daily. He's due to go back to see Dr. Brett Canales after his 18 day trip to [place] in May  Review of Systems  Constitutional: Negative.   HENT: Negative.   Eyes: Negative.   Respiratory: Negative.   Cardiovascular: Negative.   Gastrointestinal: Negative.   Genitourinary: Negative.   Musculoskeletal: Negative.   Skin: Negative.   Neurological: Negative.   Psychiatric/Behavioral: Negative.        Objective:   Physical Exam  Constitutional: He is oriented to person, place, and time. He appears well-developed and well-nourished.  HENT:  Head: Normocephalic and atraumatic.  Right Ear: External ear normal.  Left Ear: External ear normal.  Nose: Nose normal.  Mouth/Throat: Oropharynx is clear and moist.   Left lens implant clear right a little cloudy  Eyes: Conjunctivae and EOM are normal. Pupils are equal, round, and reactive to light.  Neck: Normal range of motion. Neck supple. No JVD present. No tracheal deviation present. No thyromegaly present.  Cardiovascular: Normal rate, regular rhythm, normal heart sounds and intact distal pulses.  Exam reveals no gallop and no friction rub.   No murmur heard. No carotid or aortic bruits peripheral pulses 1+ and symmetrical  Pulmonary/Chest: Effort normal and breath sounds normal. No stridor. No respiratory distress. He has no wheezes. He has no rales. He exhibits no tenderness.  Abdominal: Soft. Bowel sounds are normal. He exhibits no distension and no mass. There is no tenderness. There is no rebound and no guarding.  Genitourinary:  Genital and prostate gland will be done at urology therefore not repeated today  Musculoskeletal: Normal range of motion. He exhibits no edema and no tenderness.  Some swelling and deformity of both knees difficulty walking  Lymphadenopathy:    He has no cervical adenopathy.  Neurological: He is alert and oriented to person, place, and time. He has normal reflexes. No cranial nerve deficit. He exhibits normal muscle tone.  Skin: Skin is warm and dry. No rash noted. No erythema. No pallor.  Total body skin exam normal except for the lesion on his right lower extremity appears to be a foreign body reaction  Psychiatric: He has a normal mood and affect. His behavior is normal. Judgment and thought content normal.          Assessment & Plan:  Healthy male  History of gout continue allopurinol  History of hypertension continue current medications  History of diabetes type 1 continue oral meds and insulin check A1c today  Coronary disease asymptomatic  Hyperlipidemia continue Zocor check labs  BPH with outlet obstruction followup in urology ASAP  Severe degenerative joint disease right and left knee or so  evaluation ASAP  Hypothyroidism continue Synthroid  Decreased visual acuity right eye secondary to retinal detachment  Foreign body reaction right lower extremity return for removal and debridement  Chronic sun damage continue sunscreens as outlined  Foreign body reaction right lower extremity  Date of foreign body reaction measures about 15 mm x 15 mm it's increasing in size and is painful. He was therefore brought to the treatment room. The lesion was anesthetized after Betadine prep. With 1% lidocaine with epinephrine. The lesion was debrided and sent for pathologic analysis. The base looked clean all the granulation tissue was removed with gentle scraping of the 15 blade. Dry sterile dressing was applied. He tolerated the procedure well no complications.

## 2012-11-30 NOTE — Addendum Note (Signed)
Addended by: Kern Reap B on: 11/30/2012 02:23 PM   Modules accepted: Orders

## 2013-01-29 DIAGNOSIS — H33059 Total retinal detachment, unspecified eye: Secondary | ICD-10-CM | POA: Diagnosis not present

## 2013-01-29 DIAGNOSIS — N401 Enlarged prostate with lower urinary tract symptoms: Secondary | ICD-10-CM | POA: Diagnosis not present

## 2013-01-29 DIAGNOSIS — Z961 Presence of intraocular lens: Secondary | ICD-10-CM | POA: Diagnosis not present

## 2013-01-29 DIAGNOSIS — E109 Type 1 diabetes mellitus without complications: Secondary | ICD-10-CM | POA: Diagnosis not present

## 2013-01-29 DIAGNOSIS — N138 Other obstructive and reflux uropathy: Secondary | ICD-10-CM | POA: Diagnosis not present

## 2013-01-29 DIAGNOSIS — R3915 Urgency of urination: Secondary | ICD-10-CM | POA: Diagnosis not present

## 2013-01-29 DIAGNOSIS — R350 Frequency of micturition: Secondary | ICD-10-CM | POA: Diagnosis not present

## 2013-01-29 LAB — HM DIABETES EYE EXAM

## 2013-02-26 ENCOUNTER — Other Ambulatory Visit: Payer: Self-pay | Admitting: *Deleted

## 2013-02-26 MED ORDER — EPINEPHRINE 0.3 MG/0.3ML IJ SOAJ: 0.3000 mg | Freq: Once | INTRAMUSCULAR | Status: DC

## 2013-02-26 MED ORDER — PREDNISONE 20 MG PO TABS: 20.0000 mg | ORAL_TABLET | Freq: Every day | ORAL | Status: DC

## 2013-02-28 DIAGNOSIS — D485 Neoplasm of uncertain behavior of skin: Secondary | ICD-10-CM | POA: Diagnosis not present

## 2013-02-28 DIAGNOSIS — L570 Actinic keratosis: Secondary | ICD-10-CM | POA: Diagnosis not present

## 2013-02-28 DIAGNOSIS — L299 Pruritus, unspecified: Secondary | ICD-10-CM | POA: Diagnosis not present

## 2013-03-01 ENCOUNTER — Other Ambulatory Visit: Payer: Self-pay

## 2013-03-01 DIAGNOSIS — E039 Hypothyroidism, unspecified: Secondary | ICD-10-CM

## 2013-03-01 DIAGNOSIS — E119 Type 2 diabetes mellitus without complications: Secondary | ICD-10-CM

## 2013-03-01 DIAGNOSIS — N059 Unspecified nephritic syndrome with unspecified morphologic changes: Secondary | ICD-10-CM

## 2013-03-01 DIAGNOSIS — Z87898 Personal history of other specified conditions: Secondary | ICD-10-CM

## 2013-03-01 DIAGNOSIS — E785 Hyperlipidemia, unspecified: Secondary | ICD-10-CM

## 2013-03-01 DIAGNOSIS — I10 Essential (primary) hypertension: Secondary | ICD-10-CM

## 2013-03-01 DIAGNOSIS — I2510 Atherosclerotic heart disease of native coronary artery without angina pectoris: Secondary | ICD-10-CM

## 2013-03-01 DIAGNOSIS — M109 Gout, unspecified: Secondary | ICD-10-CM

## 2013-03-01 MED ORDER — SIMVASTATIN 40 MG PO TABS: 40.0000 mg | ORAL_TABLET | Freq: Every day | ORAL | Status: DC

## 2013-03-01 MED ORDER — LEVOTHYROXINE SODIUM 100 MCG PO TABS: 100.0000 ug | ORAL_TABLET | ORAL | Status: DC

## 2013-03-01 NOTE — Telephone Encounter (Signed)
Rx request for refill of simvastatin 40 mg to [facility]. Rx sent to pharmacy.

## 2013-03-01 NOTE — Addendum Note (Signed)
Addended by: Azucena Freed on: 03/01/2013 03:06 PM   Modules accepted: Orders

## 2013-03-01 NOTE — Telephone Encounter (Signed)
Rx for levothyroxine 100 mg sent to [facility] as well.

## 2013-03-05 ENCOUNTER — Encounter: Payer: Self-pay | Admitting: Family Medicine

## 2013-03-14 DIAGNOSIS — N138 Other obstructive and reflux uropathy: Secondary | ICD-10-CM | POA: Diagnosis not present

## 2013-03-14 DIAGNOSIS — R3915 Urgency of urination: Secondary | ICD-10-CM | POA: Diagnosis not present

## 2013-03-14 DIAGNOSIS — N401 Enlarged prostate with lower urinary tract symptoms: Secondary | ICD-10-CM | POA: Diagnosis not present

## 2013-03-19 ENCOUNTER — Other Ambulatory Visit: Payer: Self-pay | Admitting: *Deleted

## 2013-03-19 NOTE — Telephone Encounter (Signed)
Patient request medication refill be sent to [facility]

## 2013-06-06 ENCOUNTER — Ambulatory Visit (INDEPENDENT_AMBULATORY_CARE_PROVIDER_SITE_OTHER): Payer: Medicare Other

## 2013-06-06 DIAGNOSIS — Z23 Encounter for immunization: Secondary | ICD-10-CM | POA: Diagnosis not present

## 2013-06-22 DIAGNOSIS — M25569 Pain in unspecified knee: Secondary | ICD-10-CM | POA: Diagnosis not present

## 2013-08-10 ENCOUNTER — Other Ambulatory Visit: Payer: Self-pay | Admitting: Family Medicine

## 2013-08-20 DIAGNOSIS — M545 Low back pain, unspecified: Secondary | ICD-10-CM | POA: Diagnosis not present

## 2013-08-20 DIAGNOSIS — M25569 Pain in unspecified knee: Secondary | ICD-10-CM | POA: Diagnosis not present

## 2013-09-12 ENCOUNTER — Other Ambulatory Visit: Payer: Self-pay | Admitting: Orthopaedic Surgery

## 2013-09-12 DIAGNOSIS — Z01812 Encounter for preprocedural laboratory examination: Secondary | ICD-10-CM | POA: Diagnosis not present

## 2013-09-12 DIAGNOSIS — Z01818 Encounter for other preprocedural examination: Secondary | ICD-10-CM | POA: Diagnosis not present

## 2013-09-12 NOTE — H&P (Signed)
TOTAL KNEE ADMISSION H&P  Patient is being admitted for left total knee arthroplasty.  Subjective:  Chief Complaint:left knee pain.  HPI: Jonathan [name], 78 y.o. male, has a history of pain and functional disability in the left knee due to arthritis and has failed non-surgical conservative treatments for greater than 12 weeks to includeNSAID's and/or analgesics, corticosteriod injections, flexibility and strengthening excercises, supervised PT with diminished ADL's post treatment and activity modification.  Onset of symptoms was gradual, starting 5 years ago with gradually worsening course since that time. The patient noted no past surgery on the left knee(s).  Patient currently rates pain in the left knee(s) at 9 out of 10 with activity. Patient has night pain, worsening of pain with activity and weight bearing, pain that interferes with activities of daily living, pain with passive range of motion and crepitus.  Patient has evidence of subchondral sclerosis, periarticular osteophytes and joint space narrowing by imaging studies. This patient has had none. There is no active infection.  Patient Active Problem List   Diagnosis Date Noted  . Knee pain, bilateral 11/30/2012  . Foreign body granuloma of soft tissue, NEC, right lower leg 11/30/2012  . Edema 10/25/2011  . CAD 06/26/2009  . RENAL CALCULUS, RECURRENT 01/03/2009  . BENIGN PROSTATIC HYPERTROPHY, HX OF 05/30/2007  . HYPOTHYROIDISM 04/07/2007  . DIABETES MELLITUS, TYPE II 04/07/2007  . HYPERLIPIDEMIA 04/07/2007  . GOUT 04/07/2007  . HYPERTENSION 04/07/2007  . DERMATITIS, CNTCT, ACUTE D/T SOLAR RADIATION 04/07/2007  . HEPATITIS B, HX OF 04/07/2007   Past Medical History  Diagnosis Date  . CAD (coronary artery disease)     CABG 2005 by Dr Cyndia Bent  . Diabetes mellitus   . History of hepatitis B   . Hyperlipidemia   . Hypertension   . Hypothyroidism   . Hepatitis A 1950  . Sun-damaged skin   . Benign prostatic hypertrophy   .  Gout   . Glomerulonephritis acute     at age 33, due to beta strep, resolved  . DJD (degenerative joint disease)     Past Surgical History  Procedure Laterality Date  . Coranary artery bypass graft, bilateral  01/23/04    Dr Cyndia Bent  . Inguinal hernia repair      right  . Umbilical hernia repair    . Mastoidectomy  1943    R side  . Quadricept repair  2009    R side    No prescriptions prior to admission   No Known Allergies  History  Substance Use Topics  . Smoking status: Former Research scientist (life sciences)  . Smokeless tobacco: Not on file     Comment: smoked for 10 years, quit 1967  . Alcohol Use: Yes     Comment: 1 2 oz shots of vodka per night    Family History  Problem Relation Age of Onset  . Diabetes Other     1st degree relative  . Hypertension      Family History     Review of Systems  Constitutional: Negative.   HENT: Negative.   Eyes: Negative.   Respiratory: Negative.   Cardiovascular: Negative.   Gastrointestinal: Negative.   Genitourinary: Negative.   Musculoskeletal: Positive for joint pain.  Skin: Negative.   Neurological: Negative.   Endo/Heme/Allergies: Negative.   Psychiatric/Behavioral: Negative.     Objective:  Physical Exam  Constitutional: He appears well-developed.  HENT:  Head: Normocephalic.  Eyes: Pupils are equal, round, and reactive to light.  Neck: Normal range of motion.  Cardiovascular: Normal rate.   Respiratory: Effort normal.  GI: Soft.  Musculoskeletal:  Left knee exam: Range of motion 5-1 10.  Crepitation 1+ and medial joint line pain to palpation.  Hip motion full and pain-free.  negative Homans.  Sensory motor function normal.  Neurological: He is alert.  Skin: Skin is warm.  Psychiatric: He has a normal mood and affect.    Vital signs in last 24 hours:    Labs:   Estimated body mass index is 29.78 kg/(m^2) as calculated from the following:   Height as of 11/01/11: 6' 2.5" (1.892 m).   Weight as of 11/30/12: 106.595 kg (235  lb).   Imaging Review Plain radiographs demonstrate severe degenerative joint disease of the left knee(s). The overall alignment isneutral. The bone quality appears to be good for age and reported activity level.  Assessment/Plan:  End stage arthritis, left knee   The patient history, physical examination, clinical judgment of the provider and imaging studies are consistent with end stage degenerative joint disease of the left knee(s) and total knee arthroplasty is deemed medically necessary. The treatment options including medical management, injection therapy arthroscopy and arthroplasty were discussed at length. The risks and benefits of total knee arthroplasty were presented and reviewed. The risks due to aseptic loosening, infection, stiffness, patella tracking problems, thromboembolic complications and other imponderables were discussed. The patient acknowledged the explanation, agreed to proceed with the plan and consent was signed. Patient is being admitted for inpatient treatment for surgery, pain control, PT, OT, prophylactic antibiotics, VTE prophylaxis, progressive ambulation and ADL's and discharge planning. The patient is planning to be discharged home with home health services

## 2013-09-13 ENCOUNTER — Other Ambulatory Visit: Payer: Self-pay | Admitting: Family Medicine

## 2013-09-13 ENCOUNTER — Encounter (HOSPITAL_COMMUNITY): Payer: Self-pay | Admitting: Pharmacy Technician

## 2013-09-13 NOTE — Pre-Procedure Instructions (Signed)
Jonathan [name]  09/13/2013   Your procedure is scheduled on:  09/18/13  Report to [facility]  2 * 3 at 815 AM.  Call this number if you have problems the morning of surgery: [phone]   Remember:   Do not eat food or drink liquids after midnight.   Take these medicines the morning of surgery with A SIP OF WATER: synthroid,metoprolol   Do not wear jewelry, make-up or nail polish.  Do not wear lotions, powders, or perfumes. You may wear deodorant.  Do not shave 48 hours prior to surgery. Men may shave face and neck.  Do not bring valuables to the hospital.  [facility] is not responsible                  for any belongings or valuables.               Contacts, dentures or bridgework may not be worn into surgery.  Leave suitcase in the car. After surgery it may be brought to your room.  For patients admitted to the hospital, discharge time is determined by your                treatment team.               Patients discharged the day of surgery will not be allowed to drive  home.  Name and phone number of your driver: family  Special Instructions: Shower using CHG 2 nights before surgery and the night before surgery.  If you shower the day of surgery use CHG.  Use special wash - you have one bottle of CHG for all showers.  You should use approximately 1/3 of the bottle for each shower.   Please read over the following fact sheets that you were given: Pain Booklet, Coughing and Deep Breathing, Blood Transfusion Information, MRSA Information and Surgical Site Infection Prevention

## 2013-09-14 ENCOUNTER — Encounter (HOSPITAL_COMMUNITY)
Admission: RE | Admit: 2013-09-14 | Discharge: 2013-09-14 | Disposition: A | Discharge status: Home / Self Care | Payer: Medicare Other | Source: Ambulatory Visit | Attending: Orthopaedic Surgery | Admitting: Orthopaedic Surgery

## 2013-09-14 ENCOUNTER — Encounter (HOSPITAL_COMMUNITY): Payer: Self-pay

## 2013-09-14 DIAGNOSIS — Z01818 Encounter for other preprocedural examination: Secondary | ICD-10-CM | POA: Insufficient documentation

## 2013-09-14 DIAGNOSIS — Z01812 Encounter for preprocedural laboratory examination: Secondary | ICD-10-CM | POA: Insufficient documentation

## 2013-09-14 DIAGNOSIS — I10 Essential (primary) hypertension: Secondary | ICD-10-CM | POA: Diagnosis not present

## 2013-09-14 LAB — BASIC METABOLIC PANEL
BUN: 23 mg/dL (ref 6–23)
CO2: 27 mEq/L (ref 19–32)
Calcium: 9.6 mg/dL (ref 8.4–10.5)
Chloride: 101 mEq/L (ref 96–112)
Creatinine, Ser: 1.01 mg/dL (ref 0.50–1.35)
GFR calc Af Amer: 82 mL/min — ABNORMAL LOW (ref >90)
GFR calc non Af Amer: 71 mL/min — ABNORMAL LOW (ref >90)
Glucose, Bld: 149 mg/dL — ABNORMAL HIGH (ref 70–99)
Potassium: 4.2 mEq/L (ref 3.7–5.3)
Sodium: 142 mEq/L (ref 137–147)

## 2013-09-14 LAB — URINALYSIS, ROUTINE W REFLEX MICROSCOPIC
Bilirubin Urine: NEGATIVE
Glucose, UA: NEGATIVE mg/dL
Hgb urine dipstick: NEGATIVE
Ketones, ur: NEGATIVE mg/dL
Leukocytes, UA: NEGATIVE
Nitrite: NEGATIVE
Protein, ur: NEGATIVE mg/dL
Specific Gravity, Urine: 1.023 (ref 1.005–1.030)
Urobilinogen, UA: 0.2 mg/dL (ref 0.0–1.0)
pH: 5 (ref 5.0–8.0)

## 2013-09-14 LAB — CBC WITH DIFFERENTIAL/PLATELET
Basophils Absolute: 0 10*3/uL (ref 0.0–0.1)
Basophils Relative: 0 % (ref 0–1)
Eosinophils Absolute: 0 10*3/uL (ref 0.0–0.7)
Eosinophils Relative: 1 % (ref 0–5)
HCT: 42.9 % (ref 39.0–52.0)
HEMOGLOBIN: 15.1 g/dL (ref 13.0–17.0)
LYMPHS ABS: 1.6 10*3/uL (ref 0.7–4.0)
Lymphocytes Relative: 25 % (ref 12–46)
MCH: 32.9 pg (ref 26.0–34.0)
MCHC: 35.2 g/dL (ref 30.0–36.0)
MCV: 93.5 fL (ref 78.0–100.0)
Monocytes Absolute: 0.6 10*3/uL (ref 0.1–1.0)
Monocytes Relative: 9 % (ref 3–12)
NEUTROS ABS: 4.2 10*3/uL (ref 1.7–7.7)
NEUTROS PCT: 65 % (ref 43–77)
PLATELETS: 223 10*3/uL (ref 150–400)
RBC: 4.59 MIL/uL (ref 4.22–5.81)
RDW: 14.1 % (ref 11.5–15.5)
WBC: 6.5 10*3/uL (ref 4.0–10.5)

## 2013-09-14 LAB — TYPE AND SCREEN
ABO/RH(D): O POS
Antibody Screen: NEGATIVE

## 2013-09-14 LAB — PROTIME-INR
INR: 0.97 (ref 0.00–1.49)
Prothrombin Time: 12.7 seconds (ref 11.6–15.2)

## 2013-09-14 LAB — APTT: aPTT: 28 seconds (ref 24–37)

## 2013-09-14 LAB — ABO/RH: ABO/RH(D): O POS

## 2013-09-14 LAB — SURGICAL PCR SCREEN
MRSA, PCR: NEGATIVE
Staphylococcus aureus: NEGATIVE

## 2013-09-14 NOTE — Pre-Procedure Instructions (Signed)
Jonathan [name]  09/14/2013   Your procedure is scheduled on:  09/18/2013  Report to [facility]  2 * 3   Entrance A- [place] at 8:15 AM.  Call this number if you have problems the morning of surgery: [phone]   Remember:   Do not eat food or drink liquids after midnight. ON MONDAY   Take these medicines the morning of surgery with A SIP OF WATER: Metoprolol, allopurinol, synthroid, mybetriq   Do not wear jewelry  Do not wear lotions, powders, or perfumes. You may wear deodorant.   Men may shave face and neck.  Do not bring valuables to the hospital.  [facility] is not responsible   for any belongings or valuables.               Contacts, dentures or bridgework may not be worn into surgery.  Leave suitcase in the car. After surgery it may be brought to your room.  For patients admitted to the hospital, discharge time is determined by your                treatment team.               Patients discharged the day of surgery will not be allowed to drive  home.  Name and phone number of your driver: with wife  Special Instructions: Shower using CHG 2 nights before surgery and the night before surgery.  If you shower the day of surgery use CHG.  Use special wash - you have one bottle of CHG for all showers.  You should use approximately 1/3 of the bottle for each shower.      Special Instructions: [facility] - Preparing for Surgery  Before surgery, you can play an important role.  Because skin is not sterile, your skin needs to be as free of germs as possible.  You can reduce the number of germs on you skin by washing with CHG (chlorahexidine gluconate) soap before surgery.  CHG is an antiseptic cleaner which kills germs and bonds with the skin to continue killing germs even after washing.  Please DO NOT use if you have an allergy to CHG or antibacterial soaps.  If your skin becomes reddened/irritated stop using the CHG and inform your nurse when you arrive  at Short Stay.  Do not shave (including legs and underarms) for at least 48 hours prior to the first CHG shower.  You may shave your face.  Please follow these instructions carefully:   1.  Shower with CHG Soap the night before surgery and the  morning of Surgery.  2.  If you choose to wash your hair, wash your hair first as usual with your  normal shampoo.  3.  After you shampoo, rinse your hair and body thoroughly to remove the  Shampoo.  4.  Use CHG as you would any other liquid soap.  You can apply chg directly to the skin and wash gently with scrungie or a clean washcloth.  5.  Apply the CHG Soap to your body ONLY FROM THE NECK DOWN.    Do not use on open wounds or open sores.  Avoid contact with your eyes, ears, mouth and genitals (private parts).  Wash genitals (private parts)   with your normal soap.  6.  Wash thoroughly, paying special attention to the area where your surgery will be performed.  7.  Thoroughly rinse your body with warm water from  the neck down.  8.  DO NOT shower/wash with your normal soap after using and rinsing off   the CHG Soap.  9.  Pat yourself dry with a clean towel.            10.  Wear clean pajamas.            11.  Place clean sheets on your bed the night of your first shower and do not sleep with pets.  Day of Surgery  Do not apply any lotions/deodorants the morning of surgery.  Please wear clean clothes to the hospital/surgery center.   Please read over the following fact sheets that you were given: Pain Booklet, Coughing and Deep Breathing, Blood Transfusion Information, Total Joint Packet, MRSA Information and Surgical Site Infection Prevention

## 2013-09-14 NOTE — Progress Notes (Signed)
Left voicemail with A. Zelenak,PA-C, reported pt. H/o of CABG 10 yrs. Ago, currently followed by Dr. Rhunette Croft, but last saw Dr. Rayann Heman in 2013.  Pt. Had normal stress/echo in 2013.

## 2013-09-17 MED ORDER — LACTATED RINGERS IV SOLN
INTRAVENOUS | Status: DC
Start: 2013-09-17 — End: 2013-09-18
  Administered 2013-09-18 (×2): via INTRAVENOUS

## 2013-09-17 MED ORDER — CHLORHEXIDINE GLUCONATE 4 % EX LIQD: 1.0000 "application " | Freq: Once | CUTANEOUS | Status: DC

## 2013-09-17 MED ORDER — CHLORHEXIDINE GLUCONATE 4 % EX LIQD
60.0000 mL | Freq: Once | CUTANEOUS | Status: DC
Start: 2013-09-17 — End: 2013-09-18

## 2013-09-17 MED ORDER — CEFAZOLIN SODIUM-DEXTROSE 2-3 GM-% IV SOLR
2.0000 g | INTRAVENOUS | Status: AC
  Administered 2013-09-18: 2 g via INTRAVENOUS
  Filled 2013-09-17: qty 50

## 2013-09-17 NOTE — Progress Notes (Signed)
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 Rhona Raider.  History includes CAD s/p CABG (LIMA to LAD, SVG to DIAG, Sequential SVG to OM1/OM2, SVG to RCA)  '05, DM2, hepatitis A and B, HTN, hypothyroidism, BPH, glomerulonephritis at age 32, skin cancer. PCP is Dr. Sherren Mocha who is aware that he would be undergoing TKR in the near future.  Cardiologist is Dr. Lovena Le, but last visit with Dr. Rayann Heman in 10/2011.   EKG on 11/30/12 showed NSR, RSR prime in V1.  Nuclear stress test on 11/01/11 showed: Overall Impression: Normal stress nuclear study. LV Ejection Fraction: 63%. LV Wall Motion: NL LV Function; NL Wall Motion.   Echo on 11/02/11 showed: - Left ventricle: The cavity size was normal. Wall thickness was increased in a pattern of mild LVH. Systolic function was normal. The estimated ejection fraction was in the range of 55% to 60%. Wall motion was normal; there were no regional wall motion abnormalities. Doppler parameters are consistent with abnormal left ventricular relaxation (grade 1 diastolic dysfunction). - Aortic valve: Mild regurgitation. - Mitral valve: Mild to moderate regurgitation. - Left atrium: The atrium was mildly dilated. - Right ventricle: The cavity size was mildly dilated. - Right atrium: The atrium was mildly to moderately dilated. - Atrial septum: There was an atrial septal aneurysm. - Pulmonary arteries: Systolic pressure was mildly increased. PA peak pressure: 18mm Hg (S).  CXR on 09/14/13 showed no active cardiopulmonary disease.  Preoperative labs noted.    He had functional studies within the past two year with unremarkable EKG within the past year.  No new CV symptoms reported at PAT.  If no acute changes then I would anticipate that he could proceed as planned.  George Hugh [facility] Short Stay Center/Anesthesiology Phone [phone] 09/17/2013 10:31 AM

## 2013-09-18 ENCOUNTER — Encounter (HOSPITAL_COMMUNITY): Payer: Medicare Other | Admitting: Vascular Surgery

## 2013-09-18 ENCOUNTER — Inpatient Hospital Stay (HOSPITAL_COMMUNITY): Payer: Medicare Other | Admitting: Anesthesiology

## 2013-09-18 ENCOUNTER — Inpatient Hospital Stay (HOSPITAL_COMMUNITY)
Admission: RE | Admit: 2013-09-18 | Discharge: 2013-09-21 | DRG: 470 | Disposition: A | Discharge status: Skilled Nursing Facility | Payer: Medicare Other | Source: Ambulatory Visit | Attending: Orthopaedic Surgery | Admitting: Orthopaedic Surgery

## 2013-09-18 ENCOUNTER — Encounter (HOSPITAL_COMMUNITY): Payer: Self-pay | Admitting: Anesthesiology

## 2013-09-18 ENCOUNTER — Encounter (HOSPITAL_COMMUNITY)
Admission: RE | Disposition: A | Discharge status: Skilled Nursing Facility | Payer: Self-pay | Source: Ambulatory Visit | Attending: Orthopaedic Surgery

## 2013-09-18 DIAGNOSIS — Z951 Presence of aortocoronary bypass graft: Secondary | ICD-10-CM

## 2013-09-18 DIAGNOSIS — E785 Hyperlipidemia, unspecified: Secondary | ICD-10-CM | POA: Diagnosis present

## 2013-09-18 DIAGNOSIS — B1910 Unspecified viral hepatitis B without hepatic coma: Secondary | ICD-10-CM | POA: Diagnosis present

## 2013-09-18 DIAGNOSIS — Z79899 Other long term (current) drug therapy: Secondary | ICD-10-CM

## 2013-09-18 DIAGNOSIS — Z794 Long term (current) use of insulin: Secondary | ICD-10-CM

## 2013-09-18 DIAGNOSIS — Z7982 Long term (current) use of aspirin: Secondary | ICD-10-CM

## 2013-09-18 DIAGNOSIS — M109 Gout, unspecified: Secondary | ICD-10-CM | POA: Diagnosis present

## 2013-09-18 DIAGNOSIS — M6281 Muscle weakness (generalized): Secondary | ICD-10-CM | POA: Diagnosis not present

## 2013-09-18 DIAGNOSIS — Z01812 Encounter for preprocedural laboratory examination: Secondary | ICD-10-CM | POA: Diagnosis not present

## 2013-09-18 DIAGNOSIS — I2510 Atherosclerotic heart disease of native coronary artery without angina pectoris: Secondary | ICD-10-CM | POA: Diagnosis not present

## 2013-09-18 DIAGNOSIS — Z8619 Personal history of other infectious and parasitic diseases: Secondary | ICD-10-CM | POA: Diagnosis not present

## 2013-09-18 DIAGNOSIS — Z96659 Presence of unspecified artificial knee joint: Secondary | ICD-10-CM

## 2013-09-18 DIAGNOSIS — E039 Hypothyroidism, unspecified: Secondary | ICD-10-CM | POA: Diagnosis present

## 2013-09-18 DIAGNOSIS — S8990XA Unspecified injury of unspecified lower leg, initial encounter: Secondary | ICD-10-CM | POA: Diagnosis not present

## 2013-09-18 DIAGNOSIS — Z87891 Personal history of nicotine dependence: Secondary | ICD-10-CM

## 2013-09-18 DIAGNOSIS — M25569 Pain in unspecified knee: Secondary | ICD-10-CM | POA: Diagnosis not present

## 2013-09-18 DIAGNOSIS — I10 Essential (primary) hypertension: Secondary | ICD-10-CM | POA: Diagnosis present

## 2013-09-18 DIAGNOSIS — B159 Hepatitis A without hepatic coma: Secondary | ICD-10-CM | POA: Diagnosis not present

## 2013-09-18 DIAGNOSIS — N401 Enlarged prostate with lower urinary tract symptoms: Secondary | ICD-10-CM | POA: Diagnosis not present

## 2013-09-18 DIAGNOSIS — E119 Type 2 diabetes mellitus without complications: Secondary | ICD-10-CM | POA: Diagnosis present

## 2013-09-18 DIAGNOSIS — K219 Gastro-esophageal reflux disease without esophagitis: Secondary | ICD-10-CM | POA: Diagnosis not present

## 2013-09-18 DIAGNOSIS — M1710 Unilateral primary osteoarthritis, unspecified knee: Secondary | ICD-10-CM | POA: Diagnosis not present

## 2013-09-18 DIAGNOSIS — Z471 Aftercare following joint replacement surgery: Secondary | ICD-10-CM | POA: Diagnosis not present

## 2013-09-18 DIAGNOSIS — R279 Unspecified lack of coordination: Secondary | ICD-10-CM | POA: Diagnosis not present

## 2013-09-18 DIAGNOSIS — M1990 Unspecified osteoarthritis, unspecified site: Secondary | ICD-10-CM | POA: Diagnosis not present

## 2013-09-18 DIAGNOSIS — N138 Other obstructive and reflux uropathy: Secondary | ICD-10-CM | POA: Diagnosis not present

## 2013-09-18 DIAGNOSIS — G8918 Other acute postprocedural pain: Secondary | ICD-10-CM | POA: Diagnosis not present

## 2013-09-18 DIAGNOSIS — R269 Unspecified abnormalities of gait and mobility: Secondary | ICD-10-CM | POA: Diagnosis not present

## 2013-09-18 DIAGNOSIS — N318 Other neuromuscular dysfunction of bladder: Secondary | ICD-10-CM | POA: Diagnosis not present

## 2013-09-18 DIAGNOSIS — IMO0002 Reserved for concepts with insufficient information to code with codable children: Secondary | ICD-10-CM | POA: Diagnosis not present

## 2013-09-18 HISTORY — PX: TOTAL KNEE ARTHROPLASTY: SHX125

## 2013-09-18 HISTORY — DX: Unspecified viral hepatitis B without hepatic coma: B19.10

## 2013-09-18 HISTORY — DX: Reserved for concepts with insufficient information to code with codable children: IMO0002

## 2013-09-18 HISTORY — DX: Type 2 diabetes mellitus without complications: E11.9

## 2013-09-18 HISTORY — DX: Sleep apnea, unspecified: G47.30

## 2013-09-18 HISTORY — DX: Gastro-esophageal reflux disease without esophagitis: K21.9

## 2013-09-18 LAB — GLUCOSE, CAPILLARY
GLUCOSE-CAPILLARY: 166 mg/dL — AB (ref 70–99)
GLUCOSE-CAPILLARY: 161 mg/dL — AB (ref 70–99)
GLUCOSE-CAPILLARY: 149 mg/dL — AB (ref 70–99)
GLUCOSE-CAPILLARY: 106 mg/dL — AB (ref 70–99)
GLUCOSE-CAPILLARY: 155 mg/dL — AB (ref 70–99)

## 2013-09-18 SURGERY — ARTHROPLASTY, KNEE, TOTAL
Anesthesia: General | Site: Knee | Laterality: Left

## 2013-09-18 MED ORDER — MIDAZOLAM HCL 2 MG/2ML IJ SOLN
INTRAMUSCULAR | Status: AC
  Administered 2013-09-18: 1 mg
  Filled 2013-09-18: qty 2

## 2013-09-18 MED ORDER — METOCLOPRAMIDE HCL 10 MG PO TABS: 5.0000 mg | ORAL_TABLET | Freq: Three times a day (TID) | ORAL | Status: DC | PRN

## 2013-09-18 MED ORDER — METHOCARBAMOL 500 MG PO TABS
ORAL_TABLET | ORAL | Status: AC
  Administered 2013-09-18: 12:00:00
  Filled 2013-09-18: qty 1

## 2013-09-18 MED ORDER — SODIUM CHLORIDE 0.9 % IJ SOLN
INTRAMUSCULAR | Status: AC
  Filled 2013-09-18: qty 10

## 2013-09-18 MED ORDER — METOPROLOL SUCCINATE ER 25 MG PO TB24
25.0000 mg | ORAL_TABLET | Freq: Every day | ORAL | Status: DC
  Administered 2013-09-19 – 2013-09-20 (×2): 25 mg via ORAL
  Filled 2013-09-18 (×3): qty 1

## 2013-09-18 MED ORDER — ONDANSETRON HCL 4 MG/2ML IJ SOLN: 4.0000 mg | Freq: Four times a day (QID) | INTRAMUSCULAR | Status: DC | PRN

## 2013-09-18 MED ORDER — SIMVASTATIN 40 MG PO TABS
40.0000 mg | ORAL_TABLET | Freq: Every day | ORAL | Status: DC
  Administered 2013-09-18 – 2013-09-20 (×3): 40 mg via ORAL
  Filled 2013-09-18 (×5): qty 1

## 2013-09-18 MED ORDER — OXYCODONE HCL 5 MG PO TABS
ORAL_TABLET | ORAL | Status: AC
  Administered 2013-09-18: 12:00:00
  Filled 2013-09-18: qty 1

## 2013-09-18 MED ORDER — BUPIVACAINE LIPOSOME 1.3 % IJ SUSP
20.0000 mL | Freq: Once | INTRAMUSCULAR | Status: DC
  Filled 2013-09-18: qty 20

## 2013-09-18 MED ORDER — PROMETHAZINE HCL 25 MG/ML IJ SOLN: 6.2500 mg | INTRAMUSCULAR | Status: DC | PRN

## 2013-09-18 MED ORDER — OXYCODONE HCL 5 MG PO TABS
5.0000 mg | ORAL_TABLET | ORAL | Status: DC | PRN
  Administered 2013-09-18 – 2013-09-21 (×13): 10 mg via ORAL
  Filled 2013-09-18 (×14): qty 2

## 2013-09-18 MED ORDER — OXYCODONE HCL 5 MG/5ML PO SOLN: 5.0000 mg | Freq: Once | ORAL | Status: AC | PRN

## 2013-09-18 MED ORDER — ONDANSETRON HCL 4 MG/2ML IJ SOLN
INTRAMUSCULAR | Status: DC | PRN
  Administered 2013-09-18: 4 mg via INTRAVENOUS

## 2013-09-18 MED ORDER — PROPOFOL 10 MG/ML IV BOLUS
INTRAVENOUS | Status: DC | PRN
  Administered 2013-09-18: 200 mg via INTRAVENOUS

## 2013-09-18 MED ORDER — ACETAMINOPHEN 325 MG PO TABS: 650.0000 mg | ORAL_TABLET | Freq: Four times a day (QID) | ORAL | Status: DC | PRN

## 2013-09-18 MED ORDER — LIDOCAINE HCL (CARDIAC) 20 MG/ML IV SOLN
INTRAVENOUS | Status: DC | PRN
  Administered 2013-09-18: 80 mg via INTRAVENOUS

## 2013-09-18 MED ORDER — PHENOL 1.4 % MT LIQD: 1.0000 | OROMUCOSAL | Status: DC | PRN

## 2013-09-18 MED ORDER — PANTOPRAZOLE SODIUM 40 MG PO TBEC
40.0000 mg | DELAYED_RELEASE_TABLET | Freq: Every day | ORAL | Status: DC
  Administered 2013-09-19 – 2013-09-21 (×3): 40 mg via ORAL
  Filled 2013-09-18 (×3): qty 1

## 2013-09-18 MED ORDER — FENTANYL CITRATE 0.05 MG/ML IJ SOLN
INTRAMUSCULAR | Status: DC | PRN
  Administered 2013-09-18: 100 ug via INTRAVENOUS
  Administered 2013-09-18 (×3): 50 ug via INTRAVENOUS

## 2013-09-18 MED ORDER — MIRABEGRON ER 50 MG PO TB24
50.0000 mg | ORAL_TABLET | Freq: Every day | ORAL | Status: DC
Start: 2013-09-18 — End: 2013-09-21
  Administered 2013-09-19 – 2013-09-21 (×3): 50 mg via ORAL
  Filled 2013-09-18 (×5): qty 1

## 2013-09-18 MED ORDER — SODIUM CHLORIDE 0.9 % IR SOLN
Status: DC | PRN
  Administered 2013-09-18: 1000 mL

## 2013-09-18 MED ORDER — TERAZOSIN HCL 1 MG PO CAPS
1.0000 mg | ORAL_CAPSULE | Freq: Every evening | ORAL | Status: DC | PRN
  Filled 2013-09-18: qty 1

## 2013-09-18 MED ORDER — LIDOCAINE HCL (CARDIAC) 20 MG/ML IV SOLN
INTRAVENOUS | Status: AC
  Filled 2013-09-18: qty 5

## 2013-09-18 MED ORDER — SODIUM CHLORIDE 0.9 % IJ SOLN
INTRAMUSCULAR | Status: DC | PRN
  Administered 2013-09-18: 11:00:00

## 2013-09-18 MED ORDER — ONDANSETRON HCL 4 MG PO TABS: 4.0000 mg | ORAL_TABLET | Freq: Four times a day (QID) | ORAL | Status: DC | PRN

## 2013-09-18 MED ORDER — HYDROMORPHONE HCL PF 1 MG/ML IJ SOLN
INTRAMUSCULAR | Status: AC
  Administered 2013-09-18: 12:00:00
  Filled 2013-09-18: qty 2

## 2013-09-18 MED ORDER — EPHEDRINE SULFATE 50 MG/ML IJ SOLN
INTRAMUSCULAR | Status: DC | PRN
  Administered 2013-09-18: 10 mg via INTRAVENOUS

## 2013-09-18 MED ORDER — HYDROMORPHONE HCL PF 1 MG/ML IJ SOLN
0.2500 mg | INTRAMUSCULAR | Status: DC | PRN
  Administered 2013-09-18 (×4): 0.5 mg via INTRAVENOUS

## 2013-09-18 MED ORDER — DIPHENHYDRAMINE HCL 12.5 MG/5ML PO ELIX
12.5000 mg | ORAL_SOLUTION | ORAL | Status: DC | PRN
  Administered 2013-09-18 – 2013-09-21 (×6): 25 mg via ORAL
  Filled 2013-09-18 (×6): qty 10

## 2013-09-18 MED ORDER — DEXTROSE 5 % IV SOLN
500.0000 mg | Freq: Four times a day (QID) | INTRAVENOUS | Status: DC | PRN
  Filled 2013-09-18: qty 5

## 2013-09-18 MED ORDER — LEVOTHYROXINE SODIUM 100 MCG PO TABS
100.0000 ug | ORAL_TABLET | ORAL | Status: DC
  Administered 2013-09-19 – 2013-09-21 (×3): 100 ug via ORAL
  Filled 2013-09-18 (×4): qty 1

## 2013-09-18 MED ORDER — METHOCARBAMOL 500 MG PO TABS
500.0000 mg | ORAL_TABLET | Freq: Four times a day (QID) | ORAL | Status: DC | PRN
  Administered 2013-09-18 – 2013-09-20 (×8): 500 mg via ORAL
  Filled 2013-09-18 (×8): qty 1

## 2013-09-18 MED ORDER — BENAZEPRIL-HYDROCHLOROTHIAZIDE 20-25 MG PO TABS: 1.0000 | ORAL_TABLET | Freq: Every day | ORAL | Status: DC

## 2013-09-18 MED ORDER — CEFAZOLIN SODIUM-DEXTROSE 2-3 GM-% IV SOLR
2.0000 g | Freq: Four times a day (QID) | INTRAVENOUS | Status: AC
  Administered 2013-09-18: 2 g via INTRAVENOUS
  Filled 2013-09-18: qty 50

## 2013-09-18 MED ORDER — LACTATED RINGERS IV SOLN: INTRAVENOUS | Status: DC

## 2013-09-18 MED ORDER — DOCUSATE SODIUM 100 MG PO CAPS
100.0000 mg | ORAL_CAPSULE | Freq: Two times a day (BID) | ORAL | Status: DC
  Administered 2013-09-18 – 2013-09-21 (×6): 100 mg via ORAL
  Filled 2013-09-18 (×7): qty 1

## 2013-09-18 MED ORDER — METFORMIN HCL 500 MG PO TABS
1000.0000 mg | ORAL_TABLET | Freq: Two times a day (BID) | ORAL | Status: DC
  Administered 2013-09-18 – 2013-09-21 (×6): 1000 mg via ORAL
  Filled 2013-09-18 (×8): qty 2

## 2013-09-18 MED ORDER — PROPOFOL 10 MG/ML IV BOLUS
INTRAVENOUS | Status: AC
  Filled 2013-09-18: qty 20

## 2013-09-18 MED ORDER — PIOGLITAZONE HCL 15 MG PO TABS
15.0000 mg | ORAL_TABLET | Freq: Every day | ORAL | Status: DC
Start: 2013-09-18 — End: 2013-09-21
  Administered 2013-09-18 – 2013-09-21 (×4): 15 mg via ORAL
  Filled 2013-09-18 (×4): qty 1

## 2013-09-18 MED ORDER — PHENYLEPHRINE HCL 10 MG/ML IJ SOLN
INTRAMUSCULAR | Status: DC | PRN
  Administered 2013-09-18: 80 ug via INTRAVENOUS

## 2013-09-18 MED ORDER — HYDROCHLOROTHIAZIDE 25 MG PO TABS
25.0000 mg | ORAL_TABLET | Freq: Every day | ORAL | Status: DC
  Administered 2013-09-19 – 2013-09-20 (×2): 25 mg via ORAL
  Filled 2013-09-18 (×4): qty 1

## 2013-09-18 MED ORDER — MENTHOL 3 MG MT LOZG: 1.0000 | LOZENGE | OROMUCOSAL | Status: DC | PRN

## 2013-09-18 MED ORDER — PHENYLEPHRINE 40 MCG/ML (10ML) SYRINGE FOR IV PUSH (FOR BLOOD PRESSURE SUPPORT)
PREFILLED_SYRINGE | INTRAVENOUS | Status: AC
  Filled 2013-09-18: qty 10

## 2013-09-18 MED ORDER — ROCURONIUM BROMIDE 50 MG/5ML IV SOLN
INTRAVENOUS | Status: AC
  Filled 2013-09-18: qty 1

## 2013-09-18 MED ORDER — GLIPIZIDE 10 MG PO TABS
10.0000 mg | ORAL_TABLET | Freq: Two times a day (BID) | ORAL | Status: DC
  Administered 2013-09-18 – 2013-09-21 (×6): 10 mg via ORAL
  Filled 2013-09-18 (×8): qty 1

## 2013-09-18 MED ORDER — ALLOPURINOL 300 MG PO TABS
300.0000 mg | ORAL_TABLET | Freq: Every day | ORAL | Status: DC
  Administered 2013-09-19 – 2013-09-21 (×3): 300 mg via ORAL
  Filled 2013-09-18 (×3): qty 1

## 2013-09-18 MED ORDER — HYDROMORPHONE HCL PF 1 MG/ML IJ SOLN
1.0000 mg | INTRAMUSCULAR | Status: DC | PRN
  Administered 2013-09-18 – 2013-09-20 (×11): 1 mg via INTRAVENOUS
  Filled 2013-09-18 (×11): qty 1

## 2013-09-18 MED ORDER — ACETAMINOPHEN 650 MG RE SUPP: 650.0000 mg | Freq: Four times a day (QID) | RECTAL | Status: DC | PRN

## 2013-09-18 MED ORDER — FENTANYL CITRATE 0.05 MG/ML IJ SOLN
INTRAMUSCULAR | Status: AC
  Filled 2013-09-18: qty 5

## 2013-09-18 MED ORDER — ASPIRIN EC 325 MG PO TBEC
325.0000 mg | DELAYED_RELEASE_TABLET | Freq: Two times a day (BID) | ORAL | Status: DC
  Administered 2013-09-18 – 2013-09-21 (×6): 325 mg via ORAL
  Filled 2013-09-18 (×8): qty 1

## 2013-09-18 MED ORDER — FENTANYL CITRATE 0.05 MG/ML IJ SOLN
INTRAMUSCULAR | Status: AC
  Administered 2013-09-18: 50 ug
  Filled 2013-09-18: qty 2

## 2013-09-18 MED ORDER — INSULIN GLARGINE 100 UNIT/ML ~~LOC~~ SOLN
30.0000 [IU] | Freq: Every evening | SUBCUTANEOUS | Status: DC | PRN
  Filled 2013-09-18: qty 0.3

## 2013-09-18 MED ORDER — OXYCODONE HCL 5 MG PO TABS
5.0000 mg | ORAL_TABLET | Freq: Once | ORAL | Status: AC | PRN
  Administered 2013-09-18: 5 mg via ORAL

## 2013-09-18 MED ORDER — BENAZEPRIL HCL 20 MG PO TABS
20.0000 mg | ORAL_TABLET | Freq: Every day | ORAL | Status: DC
  Administered 2013-09-19 – 2013-09-21 (×3): 20 mg via ORAL
  Filled 2013-09-18 (×4): qty 1

## 2013-09-18 MED ORDER — EPHEDRINE SULFATE 50 MG/ML IJ SOLN
INTRAMUSCULAR | Status: AC
  Filled 2013-09-18: qty 1

## 2013-09-18 MED ORDER — ROPIVACAINE HCL 5 MG/ML IJ SOLN
INTRAMUSCULAR | Status: DC | PRN
  Administered 2013-09-18: 30 mL via PERINEURAL

## 2013-09-18 MED ORDER — LACTATED RINGERS IV SOLN
INTRAVENOUS | Status: DC
  Administered 2013-09-19: via INTRAVENOUS

## 2013-09-18 MED ORDER — NIACIN 100 MG PO TABS
100.0000 mg | ORAL_TABLET | Freq: Every day | ORAL | Status: DC
  Administered 2013-09-19 – 2013-09-21 (×3): 100 mg via ORAL
  Filled 2013-09-18 (×4): qty 1

## 2013-09-18 MED ORDER — LORATADINE 10 MG PO TABS
10.0000 mg | ORAL_TABLET | Freq: Every day | ORAL | Status: DC
  Administered 2013-09-19 – 2013-09-21 (×3): 10 mg via ORAL
  Filled 2013-09-18 (×4): qty 1

## 2013-09-18 MED ORDER — ONDANSETRON HCL 4 MG/2ML IJ SOLN
INTRAMUSCULAR | Status: AC
  Filled 2013-09-18: qty 2

## 2013-09-18 MED ORDER — METOCLOPRAMIDE HCL 5 MG/ML IJ SOLN: 5.0000 mg | Freq: Three times a day (TID) | INTRAMUSCULAR | Status: DC | PRN

## 2013-09-18 SURGICAL SUPPLY — 65 items
BANDAGE ELASTIC 4 VELCRO ST LF (GAUZE/BANDAGES/DRESSINGS) ×2 IMPLANT
BANDAGE ELASTIC 6 VELCRO ST LF (GAUZE/BANDAGES/DRESSINGS) ×2 IMPLANT
BANDAGE ESMARK 6X9 LF (GAUZE/BANDAGES/DRESSINGS) ×1 IMPLANT
BANDAGE GAUZE ELAST BULKY 4 IN (GAUZE/BANDAGES/DRESSINGS) ×2 IMPLANT
BLADE SAGITTAL 25.0X1.19X90 (BLADE) ×2 IMPLANT
BLADE SURG ROTATE 9660 (MISCELLANEOUS) IMPLANT
BNDG ELASTIC 6X10 VLCR STRL LF (GAUZE/BANDAGES/DRESSINGS) ×2 IMPLANT
BNDG ESMARK 6X9 LF (GAUZE/BANDAGES/DRESSINGS) ×2
BOWL SMART MIX CTS (DISPOSABLE) ×2 IMPLANT
CAP UPCHARGE REVISION TRAY ×2 IMPLANT
CAPT RP KNEE ×2 IMPLANT
CEMENT HV SMART SET (Cement) ×2 IMPLANT
CLOTH BEACON ORANGE TIMEOUT ST (SAFETY) ×2 IMPLANT
CLSR STERI-STRIP ANTIMIC 1/2X4 (GAUZE/BANDAGES/DRESSINGS) ×2 IMPLANT
COVER SURGICAL LIGHT HANDLE (MISCELLANEOUS) ×2 IMPLANT
CUFF TOURNIQUET SINGLE 34IN LL (TOURNIQUET CUFF) ×2 IMPLANT
CUFF TOURNIQUET SINGLE 44IN (TOURNIQUET CUFF) IMPLANT
DRAPE EXTREMITY T 121X128X90 (DRAPE) ×2 IMPLANT
DRAPE PROXIMA HALF (DRAPES) ×2 IMPLANT
DRAPE U-SHAPE 47X51 STRL (DRAPES) ×2 IMPLANT
DRSG ADAPTIC 3X8 NADH LF (GAUZE/BANDAGES/DRESSINGS) ×2 IMPLANT
DRSG PAD ABDOMINAL 8X10 ST (GAUZE/BANDAGES/DRESSINGS) ×2 IMPLANT
DURAPREP 26ML APPLICATOR (WOUND CARE) ×2 IMPLANT
ELECT REM PT RETURN 9FT ADLT (ELECTROSURGICAL) ×2
ELECTRODE REM PT RTRN 9FT ADLT (ELECTROSURGICAL) ×1 IMPLANT
FACESHIELD LNG OPTICON STERILE (SAFETY) ×4 IMPLANT
GLOVE BIO SURGEON STRL SZ8.5 (GLOVE) ×2 IMPLANT
GLOVE BIOGEL PI IND STRL 8 (GLOVE) ×1 IMPLANT
GLOVE BIOGEL PI IND STRL 8.5 (GLOVE) ×1 IMPLANT
GLOVE BIOGEL PI INDICATOR 8 (GLOVE) ×1
GLOVE BIOGEL PI INDICATOR 8.5 (GLOVE) ×1
GLOVE SS BIOGEL STRL SZ 8 (GLOVE) ×1 IMPLANT
GLOVE SUPERSENSE BIOGEL SZ 8 (GLOVE) ×1
GOWN PREVENTION PLUS XLARGE (GOWN DISPOSABLE) ×2 IMPLANT
GOWN STRL NON-REIN LRG LVL3 (GOWN DISPOSABLE) ×2 IMPLANT
GOWN STRL REUS W/TWL 2XL LVL3 (GOWN DISPOSABLE) ×2 IMPLANT
HANDPIECE INTERPULSE COAX TIP (DISPOSABLE) ×1
HOOD PEEL AWAY FACE SHEILD DIS (HOOD) ×2 IMPLANT
IMMOBILIZER KNEE 20 (SOFTGOODS) IMPLANT
IMMOBILIZER KNEE 22 UNIV (SOFTGOODS) ×2 IMPLANT
IMMOBILIZER KNEE 24 THIGH 36 (MISCELLANEOUS) IMPLANT
IMMOBILIZER KNEE 24 UNIV (MISCELLANEOUS)
KIT BASIN OR (CUSTOM PROCEDURE TRAY) ×2 IMPLANT
KIT ROOM TURNOVER OR (KITS) ×2 IMPLANT
MANIFOLD NEPTUNE II (INSTRUMENTS) ×2 IMPLANT
NEEDLE HYPO 21X1 ECLIPSE (NEEDLE) ×2 IMPLANT
NS IRRIG 1000ML POUR BTL (IV SOLUTION) ×2 IMPLANT
PACK TOTAL JOINT (CUSTOM PROCEDURE TRAY) ×2 IMPLANT
PAD ARMBOARD 7.5X6 YLW CONV (MISCELLANEOUS) ×4 IMPLANT
SET HNDPC FAN SPRY TIP SCT (DISPOSABLE) ×1 IMPLANT
SPONGE GAUZE 4X4 12PLY (GAUZE/BANDAGES/DRESSINGS) ×2 IMPLANT
SPONGE GAUZE 4X4 12PLY STER LF (GAUZE/BANDAGES/DRESSINGS) ×2 IMPLANT
STAPLER VISISTAT 35W (STAPLE) IMPLANT
SUCTION FRAZIER TIP 10 FR DISP (SUCTIONS) IMPLANT
SUT MNCRL AB 3-0 PS2 18 (SUTURE) IMPLANT
SUT VIC AB 0 CT1 27 (SUTURE) ×2
SUT VIC AB 0 CT1 27XBRD ANBCTR (SUTURE) ×2 IMPLANT
SUT VIC AB 2-0 CT1 27 (SUTURE) ×2
SUT VIC AB 2-0 CT1 TAPERPNT 27 (SUTURE) ×2 IMPLANT
SUT VLOC 180 0 24IN GS25 (SUTURE) ×2 IMPLANT
SYR 50ML LL SCALE MARK (SYRINGE) ×2 IMPLANT
TOWEL OR 17X24 6PK STRL BLUE (TOWEL DISPOSABLE) ×2 IMPLANT
TOWEL OR 17X26 10 PK STRL BLUE (TOWEL DISPOSABLE) ×2 IMPLANT
TRAY FOLEY CATH 14FR (SET/KITS/TRAYS/PACK) ×2 IMPLANT
WATER STERILE IRR 1000ML POUR (IV SOLUTION) ×4 IMPLANT

## 2013-09-18 NOTE — Anesthesia Postprocedure Evaluation (Signed)
  Anesthesia Post-op Note  Patient: Jonathan [name]  Procedure(s) Performed: Procedure(s): TOTAL KNEE ARTHROPLASTY (Left)  Patient Location: PACU  Anesthesia Type:GA combined with regional for post-op pain  Level of Consciousness: awake and alert   Airway and Oxygen Therapy: Patient Spontanous Breathing  Post-op Pain: mild  Post-op Assessment: Post-op Vital signs reviewed  Post-op Vital Signs: stable  Complications: No apparent anesthesia complications

## 2013-09-18 NOTE — Progress Notes (Signed)
Utilization review completed.  

## 2013-09-18 NOTE — Op Note (Signed)
PREOP DIAGNOSIS: DJD LEFT KNEE POSTOP DIAGNOSIS:  same PROCEDURE: LEFT TKR ANESTHESIA: General and block ATTENDING SURGEON: Arden Axon G ASSISTANT: Cleotis Nipper OPA and April Green RNFA  INDICATIONS FOR PROCEDURE: Jonathan [name] is a 78 y.o. male who has struggled for a long time with pain due to degenerative arthritis of the left knee.  The patient has failed many conservative non-operative measures and at this point has pain which limits the ability to sleep and walk.  The patient is offered total knee replacement.  Informed operative consent was obtained after discussion of possible risks of anesthesia, infection, neurovascular injury, DVT, and death.  The importance of the post-operative rehabilitation protocol to optimize result was stressed extensively with the patient.  SUMMARY OF FINDINGS AND PROCEDURE:  Jonathan [name] was taken to the operative suite where under the above anesthesia a left knee replacement was performed.  There were advanced degenerative changes and the bone quality was excellent.  We used the DePuy system and placed size large femur, 5 MBT tibia, 41 mm all polyethylene patella, and a size 10 mm spacer.  The patient was admitted for appropriate post-op care to include perioperative antibiotics and mechanical and pharmacologic measures for DVT prophylaxis.  DESCRIPTION OF PROCEDURE:  Jonathan [name] was taken to the operative suite where the above anesthesia was applied.  The patient was positioned supine and prepped and draped in normal sterile fashion.  An appropriate time out was performed.  After the administration of kefzol pre-op antibiotic the leg was elevated and exsanguinated and a tourniquet inflated.  A standard longitudinal incision was made on the anterior knee.  Dissection was carried down to the extensor mechanism.  All appropriate anti-infective measures were used including the pre-operative antibiotic, betadine impregnated drape, and closed hooded exhaust  systems for each member of the surgical team.  A medial parapatellar incision was made in the extensor mechanism and the knee cap flipped and the knee flexed.  Some residual meniscal tissues were removed along with any remaining ACL/PCL tissue.  A guide was placed on the tibia and a flat cut was made on it's superior surface.  An intramedullary guide was placed in the femur and was utilized to make anterior and posterior cuts creating an appropriate flexion gap.  A second intramedullary guide was placed in the femur to make a distal cut properly balancing the knee with an extension gap equal to the flexion gap.  The three bones sized to the above mentioned sizes and the appropriate guides were placed and utilized.  A trial reduction was done and the knee easily came to full extension and the patella tracked well on flexion.  The trial components were removed and all bones were cleaned with pulsatile lavage and then dried thoroughly.  Cement was mixed and was pressurized onto the bones followed by placement of the aforementioned components.  Excess cement was trimmed and pressure was held on the components until the cement had hardened.  The tourniquet was deflated and a small amount of bleeding was controlled with cautery and pressure.  The knee was irrigated thoroughly.  The extensor mechanism was re-approximated with V-loc suture in running fashion.  The knee was flexed and the repair was solid.  The subcutaneous tissues were re-approximated with #0 and #2-0 vicryl and the skin closed with a subcuticular stitch and steristrips.  A sterile dressing was applied.  Intraoperative fluids, EBL, and tourniquet time can be obtained from anesthesia records.  DISPOSITION:  The patient was taken  to recovery room in stable condition and admitted for appropriate post-op care to include peri-operative antibiotic and DVT prophylaxis with mechanical and pharmacologic measures.  Ladajah Soltys G 09/18/2013, 11:23 AM

## 2013-09-18 NOTE — Progress Notes (Signed)
Orthopedic Tech Progress Note Patient Details:  Jonathan [name] 06/30/1936 [id number] CPM applied to Left LE with appropriate settings. OHF applied to bed.  CPM Left Knee CPM Left Knee: On Left Knee Flexion (Degrees): 60 Left Knee Extension (Degrees): 0   Asia R Thompson 09/18/2013, 12:24 PM

## 2013-09-18 NOTE — Progress Notes (Signed)
Orthopedic Tech Progress Note Patient Details:  JAISHON KRISHER 19-Dec-1935 [id number] On cpm at 8:10 pm LLE 0-60  Patient ID: PHILL STECK, male   DOB: 22-Jul-1936, 78 y.o.   MRN: [id number]   Braulio Bosch 09/18/2013, 8:11 PM

## 2013-09-18 NOTE — Transfer of Care (Signed)
Immediate Anesthesia Transfer of Care Note  Patient: Jonathan [name]  Procedure(s) Performed: Procedure(s): TOTAL KNEE ARTHROPLASTY (Left)  Patient Location: PACU  Anesthesia Type:General  Level of Consciousness: awake, alert  and oriented  Airway & Oxygen Therapy: Patient Spontanous Breathing and Patient connected to nasal cannula oxygen  Post-op Assessment: Report given to PACU RN, Post -op Vital signs reviewed and stable and Patient moving all extremities X 4  Post vital signs: Reviewed and stable  Complications: No apparent anesthesia complications

## 2013-09-18 NOTE — Interval H&P Note (Signed)
History and Physical Interval Note:  09/18/2013 9:30 AM  Jonathan [name]  has presented today for surgery, with the diagnosis of DEGENERATIVE JOINT DISEASE LEFT KNEE  The various methods of treatment have been discussed with the patient and family. After consideration of risks, benefits and other options for treatment, the patient has consented to  Procedure(s): TOTAL KNEE ARTHROPLASTY (Left) as a surgical intervention .  The patient's history has been reviewed, patient examined, no change in status, stable for surgery.  I have reviewed the patient's chart and labs.  Questions were answered to the patient's satisfaction.     Dorn Hartshorne G

## 2013-09-18 NOTE — Preoperative (Signed)
Beta Blockers   Reason not to administer Beta Blockers:Not Applicable 

## 2013-09-18 NOTE — Anesthesia Procedure Notes (Addendum)
Anesthesia Regional Block:  Femoral nerve block  Pre-Anesthetic Checklist: ,, timeout performed, Correct Patient, Correct Site, Correct Laterality, Correct Procedure, Correct Position, site marked, Risks and benefits discussed, at surgeon's request and post-op pain management  Laterality: Left and Upper  Prep: chloraprep       Needles:  Injection technique: Single-shot  Needle Type: Echogenic Needle      Needle Gauge: 22 and 22 G  Needle insertion depth: 6 cm   Additional Needles:  Procedures: nerve stimulator Femoral nerve block  Nerve Stimulator or Paresthesia:  Response: Twitch elicited, 0.8 mA,   Additional Responses:   Narrative:  Start time: 09/18/2013 9:15 AM End time: 09/18/2013 9:30 AM Injection made incrementally with aspirations every 5 mL.  Performed by: Personally  Anesthesiologist: Bartolo Darter, MD  Additional Notes: BP cuff, EKG monitors applied. Sedation begun. Femoral artery palpated for location of nerve. After nerve location anesthetic injected incrementally, slowly , and after neg aspirations. Tolerated well.   Procedure Name: LMA Insertion Date/Time: 09/18/2013 9:59 AM Performed by: Kyung Rudd Pre-anesthesia Checklist: Patient identified, Emergency Drugs available, Suction available, Patient being monitored and Timeout performed Patient Re-evaluated:Patient Re-evaluated prior to inductionOxygen Delivery Method: Circle system utilized Preoxygenation: Pre-oxygenation with 100% oxygen Intubation Type: IV induction Ventilation: Mask ventilation without difficulty LMA: LMA inserted LMA Size: 4.0 Number of attempts: 1 Placement Confirmation: positive ETCO2 and breath sounds checked- equal and bilateral Tube secured with: Tape Dental Injury: Teeth and Oropharynx as per pre-operative assessment

## 2013-09-18 NOTE — Anesthesia Preprocedure Evaluation (Addendum)
Anesthesia Evaluation  Patient identified by MRN, date of birth, ID band Patient awake    Reviewed: Allergy & Precautions, H&P , NPO status , Patient's Chart, lab work & pertinent test results  History of Anesthesia Complications Negative for: history of anesthetic complications  Airway Mallampati: I      Dental  (+) Teeth Intact   Pulmonary former smoker,  breath sounds clear to auscultation        Cardiovascular hypertension, + CAD Rhythm:Regular Rate:Normal  Hx of CABG,stable,ECHO noted   Neuro/Psych    GI/Hepatic (+) Hepatitis -, A and B  Endo/Other  diabetesHypothyroidism   Renal/GU      Musculoskeletal   Abdominal   Peds  Hematology   Anesthesia Other Findings   Reproductive/Obstetrics                          Anesthesia Physical Anesthesia Plan  ASA: III  Anesthesia Plan:    Post-op Pain Management:    Induction: Intravenous  Airway Management Planned: LMA  Additional Equipment:   Intra-op Plan:   Post-operative Plan: Extubation in OR  Informed Consent: I have reviewed the patients History and Physical, chart, labs and discussed the procedure including the risks, benefits and alternatives for the proposed anesthesia with the patient or authorized representative who has indicated his/her understanding and acceptance.     Plan Discussed with: CRNA and Surgeon  Anesthesia Plan Comments:        Anesthesia Quick Evaluation

## 2013-09-19 LAB — GLUCOSE, CAPILLARY
Glucose-Capillary: 207 mg/dL — ABNORMAL HIGH (ref 70–99)
Glucose-Capillary: 253 mg/dL — ABNORMAL HIGH (ref 70–99)
Glucose-Capillary: 200 mg/dL — ABNORMAL HIGH (ref 70–99)
Glucose-Capillary: 205 mg/dL — ABNORMAL HIGH (ref 70–99)

## 2013-09-19 LAB — BASIC METABOLIC PANEL
BUN: 13 mg/dL (ref 6–23)
CALCIUM: 8.3 mg/dL — AB (ref 8.4–10.5)
CO2: 24 meq/L (ref 19–32)
Chloride: 100 mEq/L (ref 96–112)
Creatinine, Ser: 0.98 mg/dL (ref 0.50–1.35)
GFR calc Af Amer: >90 mL/min (ref >90)
GFR calc non Af Amer: 78 mL/min — ABNORMAL LOW (ref >90)
GLUCOSE: 243 mg/dL — AB (ref 70–99)
POTASSIUM: 4 meq/L (ref 3.7–5.3)
SODIUM: 139 meq/L (ref 137–147)

## 2013-09-19 LAB — CBC
HCT: 33.1 % — ABNORMAL LOW (ref 39.0–52.0)
HEMOGLOBIN: 11.4 g/dL — AB (ref 13.0–17.0)
MCH: 32.5 pg (ref 26.0–34.0)
MCHC: 34.4 g/dL (ref 30.0–36.0)
MCV: 94.3 fL (ref 78.0–100.0)
Platelets: 157 10*3/uL (ref 150–400)
RBC: 3.51 MIL/uL — AB (ref 4.22–5.81)
RDW: 14.4 % (ref 11.5–15.5)
WBC: 8 10*3/uL (ref 4.0–10.5)

## 2013-09-19 MED ORDER — INSULIN ASPART 100 UNIT/ML ~~LOC~~ SOLN
0.0000 [IU] | Freq: Three times a day (TID) | SUBCUTANEOUS | Status: DC
  Administered 2013-09-20: 2 [IU] via SUBCUTANEOUS
  Administered 2013-09-20: 3 [IU] via SUBCUTANEOUS
  Administered 2013-09-21: 2 [IU] via SUBCUTANEOUS

## 2013-09-19 NOTE — Discharge Instructions (Signed)
Continue ice and elevation May change dressing as needed. ASA 3251 pill twice a day for 2 weeks. Weightbearing as tolerated. Return to office in 2 weeks. [place] physical therapy to be provided by [place] [phone]

## 2013-09-19 NOTE — Evaluation (Signed)
Physical Therapy Evaluation Patient Details Name: Jonathan [name] MRN: [id number] DOB: July 18, 1936 Today's Date: 09/19/2013 Time: 5462-7035 PT Time Calculation (min): 41 min  PT Assessment / Plan / Recommendation History of Present Illness  [name]  Clinical Impression  Pt is s/p TKA resulting in the deficits listed below (see PT Problem List).  Pt will benefit from skilled PT to increase their independence and safety with mobility to allow discharge to the venue listed below.      PT Assessment  Patient needs continued PT services    Follow Up Recommendations  Home health PT;Supervision/Assistance - 24 hour    Does the patient have the potential to tolerate intense rehabilitation      Barriers to Discharge        Equipment Recommendations  Rolling walker with 5" wheels;3in1 (PT)    Recommendations for Other Services OT consult   Frequency 7X/week    Precautions / Restrictions Precautions Precautions: Knee Required Braces or Orthoses: Knee Immobilizer - Left Knee Immobilizer - Left: On when out of bed or walking Restrictions Weight Bearing Restrictions: Yes LLE Weight Bearing: Weight bearing as tolerated   Pertinent Vitals/Pain Reports of shortness of breath with amb; HR 137 observed highest, RN aware; O2 sats 94% Pain 7/10; RN provided medication to assist with pain control       Mobility  Bed Mobility General bed mobility comments: Presented to PT in recliner Transfers Overall transfer level: Needs assistance Equipment used: Rolling walker (2 wheeled) Transfers: Sit to/from Stand Sit to Stand: Mod assist General transfer comment: Heavy mod assist to rise and control descent; cues to shift weight anteriorly onto feet after initial rise and required steadying assist to transition hands form recliner to RW; Needed to void almost immediately upon standing Ambulation/Gait Ambulation/Gait assistance: Min assist;+2 safety/equipment Ambulation Distance (Feet): 60  Feet Assistive device: Rolling walker (2 wheeled) Gait Pattern/deviations: Step-to pattern General Gait Details: Cues for gait sequence and to activate quad for L stance stability    Exercises Total Joint Exercises Quad Sets: AROM;Left;10 reps Heel Slides: AAROM;Left;5 reps Straight Leg Raises: AAROM;Left;5 reps   PT Diagnosis: Difficulty walking;Acute pain  PT Problem List: Decreased strength;Decreased range of motion;Decreased activity tolerance;Decreased balance;Decreased mobility;Decreased knowledge of use of DME;Decreased knowledge of precautions;Pain PT Treatment Interventions: DME instruction;Gait training;Stair training;Functional mobility training;Therapeutic activities;Therapeutic exercise;Patient/family education     PT Goals(Current goals can be found in the care plan section) Acute Rehab PT Goals Patient Stated Goal: Would like to get back to skiing PT Goal Formulation: With patient Time For Goal Achievement: 09/26/13 Potential to Achieve Goals: Good  Visit Information  Last PT Received On: 09/19/13 Assistance Needed: +1 History of Present Illness: [name]       Prior [place] expects to be discharged to:: Private residence Living Arrangements: Spouse/significant other Available Help at Discharge: Family;Available 24 hours/day Type of Home: House Home Access: Stairs to enter [employer] of Steps: 3 Entrance Stairs-Rails: None (Front steps another option: 4 with rail L) Home Layout: Two level;Able to live on main level with bedroom/bathroom (Pt's office is upstairs, and he'd like to access it soon) Alternate Level Stairs-Number of Steps: flight Alternate Level Stairs-Rails: Left Home Equipment: Walker - 2 wheels Prior Function Level of Independence: Independent Comments: Goes to a trianer at the gym Communication Communication: No difficulties    Cognition  Cognition Arousal/Alertness: Awake/alert (eyes closed a lot;  insists he is awake) Behavior During Therapy: WFL for tasks assessed/performed Overall Cognitive Status: Within  Functional Limits for tasks assessed    Extremity/Trunk Assessment Upper Extremity Assessment Upper Extremity Assessment: Defer to OT evaluation Lower Extremity Assessment Lower Extremity Assessment: LLE deficits/detail LLE Deficits / Details: Decr AROM and strength, limited by pain postop LLE: Unable to fully assess due to pain   Balance General Comments General comments (skin integrity, edema, etc.): Urinary urgency  End of Session PT - End of Session Equipment Utilized During Treatment: Gait belt;Left knee immobilizer Activity Tolerance: Patient tolerated treatment well Patient left: in chair;with call bell/phone within reach;with family/visitor present Nurse Communication: Mobility status  GP     Quin Hoop [place], [place]   09/19/2013, 12:10 PM

## 2013-09-19 NOTE — Progress Notes (Signed)
Inpatient Diabetes Program Recommendations  AACE/ADA: New Consensus Statement on Inpatient Glycemic Control (2013)  Target Ranges:  Prepandial:   less than 140 mg/dL      Peak postprandial:   less than 180 mg/dL (1-2 hours)      Critically ill patients:  140 - 180 mg/dL   Results for EYTHAN, JAYNE (MRN [id number]) as of 09/19/2013 13:24  Ref. Range 09/18/2013 09:08 09/18/2013 11:59 09/18/2013 14:41 09/18/2013 17:18 09/18/2013 22:05 09/19/2013 06:59 09/19/2013 11:31  Glucose-Capillary Latest Range: 70-99 mg/dL 166 (H) 161 (H) 149 (H) 106 (H) 155 (H) 207 (H) 253 (H)  Results for SAMIL, MECHAM (MRN [id number]) as of 09/19/2013 13:24  Ref. Range 11/30/2012 11:27  Hemoglobin A1C Latest Range: 4.6-6.5 % 6.5   Diabetes history: DM2 Outpatient Diabetes medications: Glipizide 10 mg BID, Metformin 1000 mg BID, Actos 15 mg daily, and Lantus 30 units PRN (depending on blood sugar) Current orders for Inpatient glycemic control: Glipizide 10 mg BID, Metformin 1000 mg BID, Actos 15 mg daily, and Lantus 30 units PRN  Inpatient Diabetes Program Recommendations Insulin - Basal: Please discontinue Lantus 30 units PRN order (ordered from home medication list).  Correction (SSI): Please order Novolog moderate correction scale ACHS. HgbA1C: Please order an A1C to evaluate glycemic control over the past 2-3 months. Diet: Please change diet from Regular to Carb Modified diabetic diet.  Thanks, Barnie Alderman, RN, MSN, CCRN Diabetes Coordinator Inpatient Diabetes Program [phone] (Team Pager) [phone] ([facility] office) [phone] [facility] office)

## 2013-09-19 NOTE — Care Management Note (Signed)
CARE MANAGEMENT NOTE 09/19/2013  Patient:  ABDOULIE, TIERCE   Account Number:  [id number]  Date Initiated:  09/19/2013  Documentation initiated by:  Ricki Miller  Subjective/Objective Assessment:   78 yr old male s/p left total knee arthroplasty.     Action/Plan:   Case manager spooke with patient and contacted his wife as requested. Choice offered for home health needs. Called referral to [facility]. Rolling walker, 3in1 and CPM have been delivered.   Anticipated DC Date:  09/20/2013   Anticipated DC Plan:  [place]  CM consult      [facility] Choice  HOME HEALTH  DURABLE MEDICAL EQUIPMENT   Choice offered to / List presented to:  C-3 Spouse   DME arranged  3-N-1  [place]  CPM      DME agency  [employer]     [place] arranged  HH-2 PT      [place].   Status of service:  Completed, signed off Medicare Important Message given?   (If response is "NO", the following Medicare IM given date fields will be blank) Date Medicare IM given:   Date Additional Medicare IM given:    Discharge Disposition:  [place]

## 2013-09-19 NOTE — Evaluation (Addendum)
Occupational Therapy Evaluation Patient Details Name: Jonathan [name] MRN: [id number] DOB: 04/23/1936 Today's Date: 09/19/2013 Time: 1450-1540 OT Time Calculation (min): 50 min  OT Assessment / Plan / Recommendation History of present illness [name]   Clinical Impression   Pt is s/p TKA resulting in the deficits listed below (see OT Problem List). Pt will benefit from skilled OT to increase their safety and independence with ADL and functional mobility for ADL to facilitate discharge to venue listed below.        OT Assessment  Patient needs continued OT Services   Follow Up Recommendations  No OT follow up    Equipment Recommendations  None recommended by OT (patient reports that he has a 3 in 1)    Frequency  Min 3X/week    Precautions / Restrictions Precautions Precautions: Knee Required Braces or Orthoses: Knee Immobilizer - Left Knee Immobilizer - Left: On when out of bed or walking Restrictions Weight Bearing Restrictions: Yes LLE Weight Bearing: Weight bearing as tolerated   Pertinent Vitals/Pain Reports 0/10 pain yet sometimes falling asleep during session, loosing his train of thought and requests pain medication. RN aware.   On 2 LO2 via [place]: sats WNL except with activity sats dropped to low 82-79% then quickly back up to 90s with pursed lip breathing.    ADL  Grooming: Simulated;Moderate assistance Where Assessed - Grooming: Supported standing Lower Body Bathing: Simulated;Moderate assistance Where Assessed - Lower Body Bathing: Supported sitting;Supported standing Lower Body Dressing: Simulated;Performed;Maximal assistance Where Assessed - Lower Body Dressing: Supported standing;Supported sitting Toilet Transfer: +2 Total assistance Toilet Transfer: Patient Percentage: 60% Toilet Transfer Method: Sit to stand (recliner, stood to use urinal with +2 for safety) Toilet Transfer Equipment:  (to/from recliner) Toileting - Clothing Manipulation and Hygiene:  Performed;Maximal assistance (hospital gown) Where Assessed - Toileting Clothing Manipulation and Hygiene: Standing Equipment Used: Rolling walker;Knee Immobilizer;Sock aid;Reacher Transfers/Ambulation Related to ADLs: Sit><stands from lower surface much more dificult.  Premorbid bladder urgency and needs urinal as soon as on his feet.  Also have towel present. ADL Comments: Introduced AE and patient practiced with sock and and reacher.  Reports that he has reacher at home yet interested in purchasing the kit.      OT Diagnosis: Generalized weakness;Acute pain  OT Problem List: Decreased strength;Decreased activity tolerance;Impaired balance (sitting and/or standing);Decreased knowledge of use of DME or AE;Cardiopulmonary status limiting activity;Pain OT Treatment Interventions: Self-care/ADL training;Energy conservation;DME and/or AE instruction;Therapeutic activities;Patient/family education;Balance training   OT Goals(Current goals can be found in the care plan section) Acute Rehab OT Goals Patient Stated Goal: Be able to take care of myself OT Goal Formulation: With patient Time For Goal Achievement: 09/26/13 Potential to Achieve Goals: Good ADL Goals Pt Will Perform Grooming: with supervision;standing Pt Will Perform Lower Body Bathing: with min assist;sit to/from stand;with adaptive equipment Pt Will Perform Lower Body Dressing: with adaptive equipment;sit to/from stand;with min assist Pt Will Transfer to Toilet: with supervision;ambulating (3 in 1 over commode) Pt Will Perform Toileting - Clothing Manipulation and hygiene: with min guard assist;sit to/from stand  Visit Information  Last OT Received On: 09/19/13 Assistance Needed: +1 (may need +2 when stand to use urinal-urgency) History of Present Illness: [name]       Prior Functioning     Home Living Family/patient expects to be discharged to:: Private residence Living Arrangements: Spouse/significant other Available Help  at Discharge: Family;Available 24 hours/day Type of Home: House Home Access: Stairs to enter Entrance Stairs-Number of Steps: 3 Entrance Stairs-Rails:   None (Front steps another option: 4 with rail L) Home Layout: Two level;Able to live on main level with bedroom/bathroom (Pt's office is upstairs, and he'd like to access it soon) Alternate Level Stairs-Number of Steps: flight Alternate Level Stairs-Rails: Left Home Equipment: Walker - 2 wheels;Bedside commode Additional Comments: Introduced to AE and he reports that he wants to purchase the kit Prior Function Level of Independence: Independent Comments: Goes to a trainer at the gym Communication Communication: No difficulties    Cognition  Cognition Arousal/Alertness: Awake/alert;Suspect due to medications (eyes closed a lot; insists he is awake yet fell asleep 3 times) Behavior During Therapy: WFL for tasks assessed/performed Overall Cognitive Status: Within Functional Limits for tasks assessed    Extremity/Trunk Assessment Upper Extremity Assessment Upper Extremity Assessment: Overall WFL for tasks assessed Lower Extremity Assessment Lower Extremity Assessment: Defer to PT evaluation     Mobility Bed Mobility General bed mobility comments: Presented to OT in recliner Transfers Overall transfer level: Needs assistance Equipment used: Rolling walker (2 wheeled) Transfers: Sit to/from Stand Sit to Stand: Max assist General transfer comment: Heavy mod assist to rise and control descent; cues to shift weight anteriorly onto feet after initial rise and required steadying assist to transition hands from recliner to RW; Needed to void almost immediately upon standing and required +2 for safety to include assist  with urinal.  VCs to pre-position LLE forward before sit.     End of Session OT - End of Session Equipment Utilized During Treatment: Rolling walker;Left knee immobilizer;Oxygen Activity Tolerance: Patient limited by fatigue  (fell asleep 3 times during session) Patient left: in chair;with call bell/phone within reach;with nursing/sitter in room Nurse Communication: Patient requests pain meds   GO     SHAFFER, CHRISTINA 09/19/2013, 4:07 PM  ADDENDUM:  Small changes and additions to Pertinent Vitals/Pain section above  

## 2013-09-19 NOTE — Progress Notes (Signed)
Subjective: 1 Day Post-Op Procedure(s) (LRB): TOTAL KNEE ARTHROPLASTY (Left) May be weightbearing as tolerated left side. Plan is to go home potentially tomorrow with home therapy. Activity level:  ok weightbearing as tolerated both lower extremities Diet tolerance:  ok Voiding:  ok Patient reports pain as 3 on 0-10 scale.    Objective: Vital signs in last 24 hours: Temp:  [97.1 F (36.2 C)-99.4 F (37.4 C)] 98 F (36.7 C) (02/11 6226) Pulse Rate:  [57-93] 80 (02/11 0638) Resp:  [8-25] 18 (02/11 0638) BP: (120-164)/(66-100) 158/77 mmHg (02/11 0638) SpO2:  [96 %-100 %] 99 % (02/11 0638)  Labs:  Recent Labs  09/19/13 0359  HGB 11.4*    Recent Labs  09/19/13 0359  WBC 8.0  RBC 3.51*  HCT 33.1*  PLT 157    Recent Labs  09/19/13 0359  NA 139  K 4.0  CL 100  CO2 24  BUN 13  CREATININE 0.98  GLUCOSE 243*  CALCIUM 8.3*   No results found for this basename: LABPT, INR,  in the last 72 hours  Physical Exam:  Neurologically intact ABD soft Neurovascular intact Sensation intact distally Intact pulses distally Dorsiflexion/Plantar flexion intact Incision: dressing C/D/I No cellulitis present Compartment soft  Assessment/Plan:  1 Day Post-Op Procedure(s) (LRB): TOTAL KNEE ARTHROPLASTY (Left) Advance diet Up with therapy D/C IV fluids Plan for discharge tomorrow if can be cleared by therapy. ASA 325 one twice a day x2 weeks. Continue incentive spirometry and SCDs. Will change dressing later today.    Imran Nuon R 09/19/2013, 8:17 AM

## 2013-09-19 NOTE — Progress Notes (Signed)
Physical Therapy Treatment Patient Details Name: Jonathan [name] MRN: [id number] DOB: 1935-11-15 Today's Date: 09/19/2013 Time: 3086-5784 PT Time Calculation (min): 28 min  PT Assessment / Plan / Recommendation  History of Present Illness [name]   PT Comments   Making progress with mobility; Still noted desats on Room air; restarted supplemental O2; Pt tends to keep his eyes closed, but assures me he is awake and listening   Follow Up Recommendations  Home health PT;Supervision/Assistance - 24 hour     Does the patient have the potential to tolerate intense rehabilitation     Barriers to Discharge        Equipment Recommendations  Rolling walker with 5" wheels;3in1 (PT)    Recommendations for Other Services OT consult  Frequency 7X/week   Progress towards PT Goals Progress towards PT goals: Progressing toward goals  Plan Current plan remains appropriate    Precautions / Restrictions Precautions Precautions: Knee Required Braces or Orthoses: Knee Immobilizer - Left Knee Immobilizer - Left: On when out of bed or walking Restrictions Weight Bearing Restrictions: Yes LLE Weight Bearing: Weight bearing as tolerated   Pertinent Vitals/Pain 5/10 pain.  patient repositioned for comfort in CPM    Mobility  Bed Mobility Overal bed mobility: Needs Assistance Bed Mobility: Supine to Sit Supine to sit: Min assist General bed mobility comments: Cues for technique Transfers Overall transfer level: Needs assistance Equipment used: Rolling walker (2 wheeled) Transfers: Sit to/from Stand Sit to Stand: Mod assist;+2 safety/equipment General transfer comment: Heavy mod assist to rise and control descent; cues to shift weight anteriorly onto feet after initial rise and required steadying assist to transition hands form recliner to RW; Needed to void almost immediately upon standing Ambulation/Gait Ambulation/Gait assistance: Min assist;+2 safety/equipment Ambulation Distance (Feet):  60 Feet Assistive device: Rolling walker (2 wheeled) General Gait Details: Cues for gait sequence and to activate quad for L stance stability; cues also to self-monitor for activity tolerance    Exercises     PT Diagnosis:    PT Problem List:   PT Treatment Interventions:     PT Goals (current goals can now be found in the care plan section) Acute Rehab PT Goals Patient Stated Goal: Would like to get back to skiing PT Goal Formulation: With patient Time For Goal Achievement: 09/26/13 Potential to Achieve Goals: Good  Visit Information  Last PT Received On: 09/19/13 Assistance Needed: +1 History of Present Illness: [name]    Subjective Data  Subjective: Seems quite tired, but wanting to work; he indicated he thought the hardest part of this session was feeling short of breath Patient Stated Goal: Would like to get back to skiing   Cognition  Cognition Arousal/Alertness: Awake/alert (eyes closed a lot; insists he is awake) Behavior During Therapy: WFL for tasks assessed/performed Overall Cognitive Status: Within Functional Limits for tasks assessed    Balance     End of Session PT - End of Session Equipment Utilized During Treatment: Gait belt;Left knee immobilizer Activity Tolerance: Patient tolerated treatment well Patient left: with call bell/phone within reach;in bed;in CPM Nurse Communication: Mobility status   GP     Jonathan [name], [place]  09/19/2013, 4:53 PM

## 2013-09-19 NOTE — Plan of Care (Signed)
Problem: Consults Goal: Diagnosis- Total Joint Replacement Primary Total Knee Left     

## 2013-09-20 ENCOUNTER — Encounter (HOSPITAL_COMMUNITY): Payer: Self-pay | Admitting: Orthopaedic Surgery

## 2013-09-20 LAB — CBC
HCT: 29.9 % — ABNORMAL LOW (ref 39.0–52.0)
HEMOGLOBIN: 10.2 g/dL — AB (ref 13.0–17.0)
MCH: 32.3 pg (ref 26.0–34.0)
MCHC: 34.1 g/dL (ref 30.0–36.0)
MCV: 94.6 fL (ref 78.0–100.0)
PLATELETS: 148 10*3/uL — AB (ref 150–400)
RBC: 3.16 MIL/uL — AB (ref 4.22–5.81)
RDW: 14.5 % (ref 11.5–15.5)
WBC: 10.7 10*3/uL — ABNORMAL HIGH (ref 4.0–10.5)

## 2013-09-20 LAB — GLUCOSE, CAPILLARY
GLUCOSE-CAPILLARY: 178 mg/dL — AB (ref 70–99)
GLUCOSE-CAPILLARY: 130 mg/dL — AB (ref 70–99)
GLUCOSE-CAPILLARY: 108 mg/dL — AB (ref 70–99)
Glucose-Capillary: 101 mg/dL — ABNORMAL HIGH (ref 70–99)

## 2013-09-20 MED ORDER — OXYCODONE HCL 5 MG PO TABS: 5.0000 mg | ORAL_TABLET | ORAL | Status: DC | PRN

## 2013-09-20 MED ORDER — ASPIRIN 325 MG PO TBEC: 325.0000 mg | DELAYED_RELEASE_TABLET | Freq: Two times a day (BID) | ORAL | Status: DC

## 2013-09-20 MED ORDER — SODIUM CHLORIDE 0.9 % IV BOLUS (SEPSIS)
1000.0000 mL | Freq: Once | INTRAVENOUS | Status: AC
  Administered 2013-09-20: 1000 mL via INTRAVENOUS

## 2013-09-20 MED ORDER — METHOCARBAMOL 500 MG PO TABS: 500.0000 mg | ORAL_TABLET | Freq: Four times a day (QID) | ORAL | Status: DC | PRN

## 2013-09-20 NOTE — Progress Notes (Signed)
Occupational Therapy Treatment Patient Details Name: STRATTON VILLWOCK MRN: [id number] DOB: [birth year] Today's Date: 09/20/2013 Time: 9563-8756 OT Time Calculation (min): 26 min  OT Assessment / Plan / Recommendation  History of present illness [name]   OT comments  Patient progressing slowly toward OT goals.  Patient reports he is relying on his RLE alot for mobility and "it is not really my good knee".  Patient with increased difficulty with activity tolerance, LB bath and dressing, toilet transfers and sit>stand from low surface or a surface without an armrest.  Patient and wife are concerned about getting out of low bed at home.  Patient will benefit from at least one more day of therapy before discharge.   Follow Up Recommendations  No OT follow up    Equipment Recommendations  None recommended by OT (has 3 in 1)    Frequency Min 3X/week   Progress towards OT Goals Progress towards OT goals: Progressing toward goals  Plan Discharge plan remains appropriate    Precautions / Restrictions Precautions Precautions: Knee Required Braces or Orthoses: Knee Immobilizer - Left Knee Immobilizer - Left: On when out of bed or walking Restrictions Weight Bearing Restrictions: Yes LLE Weight Bearing: Weight bearing as tolerated   Pertinent Vitals/Pain Denies pain, premedicated.    ADL  Toilet Transfer: Performed;Moderate assistance (heavy mod to stand from recliner) Toilet Transfer: Patient Percentage: 70% Toilet Transfer Method: Sit to Loss adjuster, chartered: Raised toilet seat with arms (or 3-in-1 over toilet) [place] and Hygiene: Min guard (hospital gown and no bladder urgency) Where Assessed - Toileting Clothing Manipulation and Hygiene: Sit on 3-in-1 or toilet;Standing;Sit to stand from 3-in-1 or toilet Transfers/Ambulation Related to ADLs: Sit><stands from lower surface much more difficult.   ADL Comments: 3 in 1 positioned at tallest position and  placed over commode to simulate plan for use at home.  Wife, Burman Nieves, present and assisting with KI and functional mobility.    OT Goals(current goals can now be found in the care plan section) Acute Rehab OT Goals Patient Stated Goal: Take care of myself if I can  Visit Information  Last OT Received On: 09/20/13 Assistance Needed: +1 (+2 if low surface or urgent need for urinal once standing) History of Present Illness: [name]    Prior [place] expects to be discharged to:: Private residence Living Arrangements: Spouse/significant other Available Help at Discharge: Family;Available 24 hours/day Type of Home: House Home Access: Stairs to enter Entrance Stairs-Number of Steps: 3 Entrance Stairs-Rails: None (4 front steps with rail) Home Layout: Two level;Able to live on main level with bedroom/bathroom    Cognition  Cognition Arousal/Alertness: Awake/alert Behavior During Therapy: WFL for tasks assessed/performed Overall Cognitive Status: Within Functional Limits for tasks assessed    Mobility  Bed Mobility Overal bed mobility: Needs Assistance Bed Mobility: Supine to Sit Supine to sit: Min assist General bed mobility comments: received by OT in recliner Transfers Overall transfer level: Needs assistance Equipment used: Rolling walker (2 wheeled) Transfers: Sit to/from [facility] Sit to Stand: Mod assist Stand pivot transfers: Min guard General transfer comment: Heavily relies on armrests and someone to stabilize walker for sit>stands from lower surface.  Wife and patient concerned about low bed at home.    End of Session OT - End of Session Equipment Utilized During Treatment: Rolling walker;Left knee immobilizer Activity Tolerance: Patient tolerated treatment well (more alert today) Patient left: with nursing/sitter in room (seated on 3 in 1 over  commode with NT and wife present)  Belva, [place] 09/20/2013,  12:51 PM

## 2013-09-20 NOTE — Progress Notes (Signed)
Physical Therapy Treatment Patient Details Name: Jonathan [name] MRN: [id number] DOB: 03/28/1936 Today's Date: 09/20/2013 Time: 5009-3818 PT Time Calculation (min): 29 min  PT Assessment / Plan / Recommendation  History of Present Illness [name]   PT Comments   Pt. Appears more sedated this pm and had decrease in short term memory over events of this am session.  He kept his eyes closed a good portion of session, though would readily open them on request .  He needed extra cueing to follow directions to turn around and back up to recliner.  Still needing significant assist for mobility.  Will monitor for improvement in am session as I do not believe to be managed at home quite yet.   Follow Up Recommendations  Home health PT;Supervision/Assistance - 24 hour     Does the patient have the potential to tolerate intense rehabilitation     Barriers to Discharge        Equipment Recommendations  Rolling walker with 5" wheels;3in1 (PT)    Recommendations for Other Services    Frequency 7X/week   Progress towards PT Goals Progress towards PT goals: Progressing toward goals  Plan Current plan remains appropriate    Precautions / Restrictions Precautions Precautions: Knee Required Braces or Orthoses: Knee Immobilizer - Left Knee Immobilizer - Left: On when out of bed or walking Restrictions Weight Bearing Restrictions: Yes LLE Weight Bearing: Weight bearing as tolerated   Pertinent Vitals/Pain See vitals tab Pain does not appear to be a limiting factor in his progress    Mobility  Bed Mobility Overal bed mobility: Needs Assistance;+2 for physical assistance Bed Mobility: Supine to Sit Supine to sit: +2 for physical assistance;Mod assist General bed mobility comments: needed increased physical assist this pm, vc's for technqiue and to discourage use of trapeze for increased upper body use Transfers Overall transfer level: Needs assistance Equipment used: Rolling walker (2  wheeled) Transfers: Sit to/from Stand Sit to Stand: +2 physical assistance;Mod assist Stand pivot transfers: Min guard General transfer comment: Needed be elevated for standing.  Needs cues for hand placement as he tends to try to stand by pulling on RW Ambulation/Gait Ambulation/Gait assistance: +2 physical assistance Ambulation Distance (Feet): 15 Feet Assistive device: Rolling walker (2 wheeled) Gait Pattern/deviations: Step-to pattern;Trunk flexed;Decreased step length - right;Decreased step length - left General Gait Details: Pt. able to state correct seqence and verbalizes aloud each step of the way.  2 assist for safety as pt.less alert this pm and needing more external assist.    Exercises Total Joint Exercises Ankle Circles/Pumps: AROM;Both;10 reps Quad Sets: Both;10 reps;AROM Short Arc Quad: AAROM;Left;10 reps   PT Diagnosis:    PT Problem List:   PT Treatment Interventions:     PT Goals (current goals can now be found in the care plan section) Acute Rehab PT Goals Patient Stated Goal: Take care of myself if I can  Visit Information  Last PT Received On: 09/20/13 Assistance Needed: +2 History of Present Illness: [name]    Subjective Data  Subjective: Pt. does not recognize this therapist from session this am. Keeping his eyes closed much of session. Patient Stated Goal: Take care of myself if I can   Cognition  Cognition Arousal/Alertness: Lethargic;Suspect due to medications Behavior During Therapy: [facility] for tasks assessed/performed Overall Cognitive Status:  (pt. needed more cueing this pm due to decreased alertness) Memory: Decreased short-term memory;Decreased recall of precautions    Balance     End of Session PT - End  of Session Equipment Utilized During Treatment: Gait belt;Left knee immobilizer Activity Tolerance: Patient limited by fatigue;Patient limited by lethargy Patient left: in chair;with call bell/phone within reach Nurse Communication: Mobility  status;Other (comment) (pt.appears more sedated than this am)   GP     Ladona Ridgel 09/20/2013, 4:10 PM Gerlean Ren PT Acute Rehab Services [phone] Beeper [phone]

## 2013-09-20 NOTE — Progress Notes (Signed)
Subjective: 2 Days Post-Op Procedure(s) (LRB): TOTAL KNEE ARTHROPLASTY (Left) Patient making slow but steady progress in therapy. We'll plan discharge Friday if cleared by therapy. Activity level:  ok may be weightbearing as tolerated Diet tolerance:  ok Voiding:  ok Patient reports pain as 3 on 0-10 scale.    Objective: Vital signs in last 24 hours: Temp:  [97.5 F (36.4 C)-98.5 F (36.9 C)] 98.3 F (36.8 C) (02/12 0545) Pulse Rate:  [83-101] 101 (02/12 0545) Resp:  [14-20] 18 (02/12 0545) BP: (108-135)/(67-79) 109/79 mmHg (02/12 0545) SpO2:  [96 %-98 %] 97 % (02/12 0545)  Labs:  Recent Labs  09/19/13 0359 09/20/13 0359  HGB 11.4* 10.2*    Recent Labs  09/19/13 0359 09/20/13 0359  WBC 8.0 10.7*  RBC 3.51* 3.16*  HCT 33.1* 29.9*  PLT 157 148*    Recent Labs  09/19/13 0359  NA 139  K 4.0  CL 100  CO2 24  BUN 13  CREATININE 0.98  GLUCOSE 243*  CALCIUM 8.3*   No results found for this basename: LABPT, INR,  in the last 72 hours  Physical Exam:  Neurologically intact ABD soft Neurovascular intact Sensation intact distally Intact pulses distally Dorsiflexion/Plantar flexion intact Incision: dressing C/D/I No cellulitis present Compartment soft Dressing changed today.  Assessment/Plan:  2 Days Post-Op Procedure(s) (LRB): TOTAL KNEE ARTHROPLASTY (Left) Advance diet Up with therapy Plan for discharge tomorrow We'll continue ASA 3251 pill twice a day x2 weeks. Plan discharge for tomorrow after clearing therapy. Prescriptions are in chart. Weightbearing as tolerated. May change dressing when necessary. Return to office 2 weeks.    Yanissa Michalsky R 09/20/2013, 11:45 AM

## 2013-09-20 NOTE — Progress Notes (Signed)
Physical Therapy Treatment Patient Details Name: Jonathan [name] MRN: [id number] DOB: [birth year] Today's Date: 09/20/2013 Time: 2979-8921 PT Time Calculation (min): 38 min  PT Assessment / Plan / Recommendation  History of Present Illness [name]   PT Comments   Pt. Progressing on the slower side with his mobility and I do not believe he is reacy for DC home today from PT standpoint.  He verbally says pain is well managed but he moans and grimaces with activity.  Continue with plan of care.  Follow Up Recommendations  Home health PT;Supervision/Assistance - 24 hour     Does the patient have the potential to tolerate intense rehabilitation     Barriers to Discharge        Equipment Recommendations  Rolling walker with 5" wheels;3in1 (PT)    Recommendations for Other Services    Frequency 7X/week   Progress towards PT Goals Progress towards PT goals: Progressing toward goals  Plan Current plan remains appropriate    Precautions / Restrictions Precautions Precautions: Knee Required Braces or Orthoses: Knee Immobilizer - Left Knee Immobilizer - Left: On when out of bed or walking Restrictions Weight Bearing Restrictions: Yes LLE Weight Bearing: Weight bearing as tolerated   Pertinent Vitals/Pain See vitals tab     Mobility  Bed Mobility Overal bed mobility: Needs Assistance Bed Mobility: Supine to Sit Supine to sit: Min assist General bed mobility comments: Cues for technique Transfers Overall transfer level: Needs assistance Equipment used: Rolling walker (2 wheeled) Transfers: Sit to/from Stand Sit to Stand: Min assist;+2 physical assistance General transfer comment: min of 2 assist to rise to stand and for conrtrol of descent to chair; pt. sttod at the bedside with min assist for balance while he used urinal.  Needed second person to assist in management pt. gown and of urinal due to pt. not able to fully balance and do both Ambulation/Gait Ambulation/Gait assistance:  Min assist;+2 physical assistance Ambulation Distance (Feet): 60 Feet Assistive device: Rolling walker (2 wheeled) Gait Pattern/deviations: Step-to pattern Gait velocity: decreased General Gait Details: pt, with moaning and grimacing druing weight bearing but verbally says he is doing well; cues for sequencing and correct RW placement    Exercises Total Joint Exercises Ankle Circles/Pumps: AROM;Both;10 reps Quad Sets: Both;10 reps;AROM Short Arc Quad: AAROM;Left;10 reps Straight Leg Raises: AAROM;Left;5 reps Knee Flexion: AROM;Left;5 reps;Seated Goniometric ROM: ~ 0 to 85   PT Diagnosis:    PT Problem List:   PT Treatment Interventions:     PT Goals (current goals can now be found in the care plan section)    Visit Information  Last PT Received On: 09/20/13 Assistance Needed: +2 (second person for recliner and extra hands for use of urinal) History of Present Illness: [name]    Subjective Data  Subjective: Pt. monas with activity but says his pain is 1/10 and says he feels well   Cognition  Cognition Arousal/Alertness: Awake/alert Behavior During Therapy: WFL for tasks assessed/performed Overall Cognitive Status: Within Functional Limits for tasks assessed    Balance     End of Session PT - End of Session Equipment Utilized During Treatment: Gait belt;Left knee immobilizer Activity Tolerance: Patient tolerated treatment well Patient left: in chair;with call bell/phone within reach Nurse Communication: Mobility status   GP     Ladona Ridgel 09/20/2013, 11:37 AM [place] [phone] Beeper [phone]

## 2013-09-20 NOTE — Progress Notes (Signed)
Pt BP=85/43 after retake in both right and left arms.  Pt asymptomatic.  On call Grier Mitts, PA contacted; 1L bolus of NS ordered.  Upon completion of 1L bolus BP=100/43.

## 2013-09-20 NOTE — Discharge Summary (Signed)
Patient ID: Jonathan [name] MRN: [id number] DOB/AGE: 02/20/1936 78 y.o.  Admit date: 09/18/2013 Discharge date: 09/21/2013  Admission Diagnoses:  Active Problems:   Total knee replacement status   Discharge Diagnoses:  Same  Past Medical History  Diagnosis Date  . CAD (coronary artery disease)     CABG 2005 by Dr Cyndia Bent  . Diabetes mellitus   . History of hepatitis B   . Hyperlipidemia   . Hypertension   . Hypothyroidism   . Sun-damaged skin   . Benign prostatic hypertrophy   . Gout   . Hepatitis A 1950    Hep-B- 1980's  . Glomerulonephritis acute     at age 53, due to beta strep, resolved, renal calculi- passed spontaneously  . Cancer     pre CA- skin   . DJD (degenerative joint disease)     bilateral knee, gout    Surgeries: Procedure(s): TOTAL KNEE ARTHROPLASTY on 09/18/2013   Consultants:    Discharged Condition: Improved  Hospital Course: Jonathan [name] is an 78 y.o. male who was admitted 09/18/2013 for operative treatment of<principal problem not specified>. Patient has severe unremitting pain that affects sleep, daily activities, and work/hobbies. After pre-op clearance the patient was taken to the operating room on 09/18/2013 and underwent  Procedure(s): TOTAL KNEE ARTHROPLASTY.    Patient was given perioperative antibiotics: Anti-infectives   Start     Dose/Rate Route Frequency Ordered Stop   09/18/13 1600  ceFAZolin (ANCEF) IVPB 2 g/50 mL premix     2 g 100 mL/hr over 30 Minutes Intravenous Every 6 hours 09/18/13 1406 09/18/13 1715   09/18/13 0600  ceFAZolin (ANCEF) IVPB 2 g/50 mL premix     2 g 100 mL/hr over 30 Minutes Intravenous On call to O.R. 09/17/13 1417 09/18/13 1000       Patient was given sequential compression devices, early ambulation, and chemoprophylaxis to prevent DVT.  Patient benefited maximally from hospital stay and there were no complications.    Recent vital signs: Patient Vitals for the past 24 hrs:  BP Temp Temp src Pulse  Resp SpO2  09/20/13 0545 109/79 mmHg 98.3 F (36.8 C) Oral 101 18 97 %  09/20/13 0400 - - - - 14 98 %  09/20/13 0000 - - - - 16 98 %  09/19/13 2017 108/67 mmHg 97.5 F (36.4 C) Oral 83 18 96 %  09/19/13 2000 - - - - 16 96 %  09/19/13 1233 135/79 mmHg 98.5 F (36.9 C) - 91 20 96 %     Recent laboratory studies:  Recent Labs  09/19/13 0359 09/20/13 0359  WBC 8.0 10.7*  HGB 11.4* 10.2*  HCT 33.1* 29.9*  PLT 157 148*  NA 139  --   K 4.0  --   CL 100  --   CO2 24  --   BUN 13  --   CREATININE 0.98  --   GLUCOSE 243*  --   CALCIUM 8.3*  --      Discharge Medications:     Medication List    STOP taking these medications       aspirin 325 MG tablet  Replaced by:  aspirin 325 MG EC tablet     naproxen 250 MG tablet  Commonly known as:  NAPROSYN      TAKE these medications       allopurinol 300 MG tablet  Commonly known as:  ZYLOPRIM  Take 1 tablet (300 mg total) by mouth daily.  aspirin 325 MG EC tablet  Take 1 tablet (325 mg total) by mouth 2 (two) times daily after a meal.     benazepril-hydrochlorthiazide 20-25 MG per tablet  Commonly known as:  LOTENSIN HCT  Take 1 tablet by mouth daily.     cetirizine 10 MG tablet  Commonly known as:  ZYRTEC  Take 10 mg by mouth daily as needed for allergies.     EPINEPHrine 0.3 mg/0.3 mL Soaj injection  Commonly known as:  EPI-PEN  Inject 0.3 mLs (0.3 mg total) into the muscle once.     FREESTYLE LITE test strip  Generic drug:  glucose blood  USE TWICE A DAY AS DIRECTED     FREESTYLE LITE TEST VI  1 each by Other route 2 (two) times daily.     glipiZIDE 10 MG tablet  Commonly known as:  GLUCOTROL  Take 1 tablet (10 mg total) by mouth 2 (two) times daily.     insulin glargine 100 UNIT/ML injection  Commonly known as:  LANTUS  Inject 30 Units into the skin at bedtime as needed (depending on blood sugar levels).     levothyroxine 100 MCG tablet  Commonly known as:  SYNTHROID, LEVOTHROID  Take 1 tablet  (100 mcg total) by mouth every morning.     metFORMIN 1000 MG tablet  Commonly known as:  GLUCOPHAGE  Take 1 tablet (1,000 mg total) by mouth 2 (two) times daily with a meal.     methocarbamol 500 MG tablet  Commonly known as:  ROBAXIN  Take 1 tablet (500 mg total) by mouth every 6 (six) hours as needed for muscle spasms.     metoprolol succinate 50 MG 24 hr tablet  Commonly known as:  TOPROL-XL  Take 25 mg by mouth daily. Take with or immediately following a meal.     MULTIVITAMIN PO  Take 1 tablet by mouth daily.     MYRBETRIQ 50 MG Tb24 tablet  Generic drug:  mirabegron ER  Take 50 mg by mouth daily before breakfast.     niacin 100 MG tablet  Take 100 mg by mouth daily.     nitroGLYCERIN 0.4 MG SL tablet  Commonly known as:  NITROSTAT  Place 1 tablet (0.4 mg total) under the tongue as needed.     Omega-3 Fatty Acids 350 MG Caps  Take 700 mg by mouth daily before breakfast.     omeprazole 20 MG capsule  Commonly known as:  PRILOSEC  Take 80 mg by mouth daily as needed.     oxyCODONE 5 MG immediate release tablet  Commonly known as:  Oxy IR/ROXICODONE  Take 1-2 tablets (5-10 mg total) by mouth every 3 (three) hours as needed for breakthrough pain.     pioglitazone 45 MG tablet  Commonly known as:  ACTOS  TAKE ONE TABLET BY MOUTH DAILY     polycarbophil 625 MG tablet  Commonly known as:  FIBERCON  Take 625 mg by mouth as needed.     simvastatin 40 MG tablet  Commonly known as:  ZOCOR  Take 1 tablet (40 mg total) by mouth at bedtime.     terazosin 1 MG capsule  Commonly known as:  HYTRIN  Take 1 mg by mouth at bedtime as needed (overactive bladder).     terazosin 1 MG capsule  Commonly known as:  HYTRIN  Take 1 capsule (1 mg total) by mouth at bedtime.        Diagnostic Studies: Dg Chest 2  View  09/14/2013   CLINICAL DATA:  Hypertension.  CABG.  EXAM: CHEST  2 VIEW  COMPARISON:  DG CHEST 2 VIEW dated 03/20/2004  FINDINGS: Mediastinum and hilar structures  are normal. Heart size normal. Prior CABG. Lungs are clear. No pleural effusion or pneumothorax. No acute osseous abnormality. Chest is stable from prior exam.  IMPRESSION: No active cardiopulmonary disease.   Electronically Signed   By: Marcello Moores  Register   On: 09/14/2013 14:28    Disposition:       Discharge Orders   Future Orders Complete By Expires   Call MD / Call 911  As directed    Comments:     If you experience chest pain or shortness of breath, CALL 911 and be transported to the hospital emergency room.  If you develope a fever above 101 F, pus (white drainage) or increased drainage or redness at the wound, or calf pain, call your surgeon's office.   Constipation Prevention  As directed    Comments:     Drink plenty of fluids.  Prune juice may be helpful.  You may use a stool softener, such as Colace (over the counter) 100 mg twice a day.  Use MiraLax (over the counter) for constipation as needed.   Diet - low sodium heart healthy  As directed    Increase activity slowly as tolerated  As directed       Follow-up Information   Follow up with DALLDORF,PETER G, MD. Call in 2 weeks.   Specialty:  Orthopedic Surgery   Contact information:   [place] [address] [phone]        Signed: Dione Housekeeper 09/20/2013, 11:50 AM

## 2013-09-21 ENCOUNTER — Encounter (HOSPITAL_COMMUNITY): Payer: Self-pay | Admitting: General Practice

## 2013-09-21 DIAGNOSIS — Z96659 Presence of unspecified artificial knee joint: Secondary | ICD-10-CM | POA: Diagnosis not present

## 2013-09-21 DIAGNOSIS — M109 Gout, unspecified: Secondary | ICD-10-CM | POA: Diagnosis not present

## 2013-09-21 DIAGNOSIS — R269 Unspecified abnormalities of gait and mobility: Secondary | ICD-10-CM | POA: Diagnosis not present

## 2013-09-21 DIAGNOSIS — N401 Enlarged prostate with lower urinary tract symptoms: Secondary | ICD-10-CM | POA: Diagnosis not present

## 2013-09-21 DIAGNOSIS — Z471 Aftercare following joint replacement surgery: Secondary | ICD-10-CM | POA: Diagnosis not present

## 2013-09-21 DIAGNOSIS — Z8619 Personal history of other infectious and parasitic diseases: Secondary | ICD-10-CM | POA: Diagnosis not present

## 2013-09-21 DIAGNOSIS — E119 Type 2 diabetes mellitus without complications: Secondary | ICD-10-CM | POA: Diagnosis not present

## 2013-09-21 DIAGNOSIS — M6281 Muscle weakness (generalized): Secondary | ICD-10-CM | POA: Diagnosis not present

## 2013-09-21 DIAGNOSIS — E039 Hypothyroidism, unspecified: Secondary | ICD-10-CM | POA: Diagnosis not present

## 2013-09-21 DIAGNOSIS — S8990XA Unspecified injury of unspecified lower leg, initial encounter: Secondary | ICD-10-CM | POA: Diagnosis not present

## 2013-09-21 DIAGNOSIS — N318 Other neuromuscular dysfunction of bladder: Secondary | ICD-10-CM | POA: Diagnosis not present

## 2013-09-21 DIAGNOSIS — R279 Unspecified lack of coordination: Secondary | ICD-10-CM | POA: Diagnosis not present

## 2013-09-21 DIAGNOSIS — E785 Hyperlipidemia, unspecified: Secondary | ICD-10-CM | POA: Diagnosis not present

## 2013-09-21 DIAGNOSIS — M25569 Pain in unspecified knee: Secondary | ICD-10-CM | POA: Diagnosis not present

## 2013-09-21 DIAGNOSIS — N138 Other obstructive and reflux uropathy: Secondary | ICD-10-CM | POA: Diagnosis not present

## 2013-09-21 DIAGNOSIS — I2510 Atherosclerotic heart disease of native coronary artery without angina pectoris: Secondary | ICD-10-CM | POA: Diagnosis not present

## 2013-09-21 DIAGNOSIS — I10 Essential (primary) hypertension: Secondary | ICD-10-CM | POA: Diagnosis not present

## 2013-09-21 DIAGNOSIS — M1990 Unspecified osteoarthritis, unspecified site: Secondary | ICD-10-CM | POA: Diagnosis not present

## 2013-09-21 DIAGNOSIS — K219 Gastro-esophageal reflux disease without esophagitis: Secondary | ICD-10-CM | POA: Diagnosis not present

## 2013-09-21 LAB — CBC
HCT: 25.6 % — ABNORMAL LOW (ref 39.0–52.0)
HEMOGLOBIN: 8.9 g/dL — AB (ref 13.0–17.0)
MCH: 32.5 pg (ref 26.0–34.0)
MCHC: 34.8 g/dL (ref 30.0–36.0)
MCV: 93.4 fL (ref 78.0–100.0)
Platelets: 139 10*3/uL — ABNORMAL LOW (ref 150–400)
RBC: 2.74 MIL/uL — AB (ref 4.22–5.81)
RDW: 14.4 % (ref 11.5–15.5)
WBC: 7.8 10*3/uL (ref 4.0–10.5)

## 2013-09-21 LAB — GLUCOSE, CAPILLARY
GLUCOSE-CAPILLARY: 104 mg/dL — AB (ref 70–99)
Glucose-Capillary: 88 mg/dL (ref 70–99)

## 2013-09-21 MED ORDER — SENNA 8.6 MG PO TABS
1.0000 | ORAL_TABLET | Freq: Every day | ORAL | Status: DC
  Administered 2013-09-21: 8.6 mg via ORAL
  Filled 2013-09-21: qty 1

## 2013-09-21 NOTE — Progress Notes (Addendum)
Subjective: 3 Days Post-Op Procedure(s) (LRB): TOTAL KNEE ARTHROPLASTY (Left) Plan is to goto skilled nursing facility when bed available.  He is making slow progress. No BM Activity level:  Weightbearing as tolerated Diet tolerance:  ok Voiding:  ok Patient reports pain as 4 on 0-10 scale.    Objective: Vital signs in last 24 hours: Temp:  [98.1 F (36.7 C)-99.8 F (37.7 C)] 98.8 F (37.1 C) (02/13 0415) Pulse Rate:  [85-98] 98 (02/13 0415) Resp:  [16-22] 22 (02/13 0415) BP: (85-138)/(43-67) 113/67 mmHg (02/13 0415) SpO2:  [85 %-96 %] 85 % (02/13 0415)  Labs:  Recent Labs  09/19/13 0359 09/20/13 0359 09/21/13 0705  HGB 11.4* 10.2* 8.9*    Recent Labs  09/20/13 0359 09/21/13 0705  WBC 10.7* 7.8  RBC 3.16* 2.74*  HCT 29.9* 25.6*  PLT 148* 139*    Recent Labs  09/19/13 0359  NA 139  K 4.0  CL 100  CO2 24  BUN 13  CREATININE 0.98  GLUCOSE 243*  CALCIUM 8.3*   No results found for this basename: LABPT, INR,  in the last 72 hours  Physical Exam:  Neurologically intact ABD soft Neurovascular intact Sensation intact distally Intact pulses distally Dorsiflexion/Plantar flexion intact Incision: dressing C/D/I No cellulitis present  Assessment/Plan:  3 Days Post-Op Procedure(s) (LRB): TOTAL KNEE ARTHROPLASTY (Left) Advance diet Up with therapy Discharge home with home health if cleared by therapist otherwise Saturday would be the next option. Laxative of choice today Continue ASA 325 one twice a day x2 weeks. Prescriptions are on the chart already. Once again if cleared by therapy and safe to go home today that is fine if not Saturday Patient has reconsidered skilled nursing facility placement and would like to go to [facility].  This has been arranged by social services.  Discharge should be today.    Raydell Maners R 09/21/2013, 8:16 AM

## 2013-09-21 NOTE — Progress Notes (Signed)
Physical Therapy Treatment Patient Details Name: Jonathan [name] MRN: [id number] DOB: Jul 16, 1936 Today's Date: 09/21/2013 Time: 8119-1478 PT Time Calculation (min): 32 min  PT Assessment / Plan / Recommendation  History of Present Illness [name]   PT Comments   Pt. Appears easily frustrated and somewhat overwhelmed today with all the different staff coming into his room.  He reports he cannot keep up with who each person is.  He recalled me from yesterday am session but not from his pm session.  He and his wife have decided they would like to pursue SNF for rehab and I believe this is the best option for him due to his slower rate of progress.   He is more stiff generally today , and specifically his knee has decreased ROM. Flexion limited by his pain level though he continues to rate pain at 1/10.  He is however mobilizing somewhat better and able to walk about 65 feet with one person assist and second person for recliner chair.   Will continue with plan of care.   Follow Up Recommendations  SNF;Supervision/Assistance - 24 hour     Does the patient have the potential to tolerate intense rehabilitation     Barriers to Discharge        Equipment Recommendations  Rolling walker with 5" wheels;3in1 (PT)    Recommendations for Other Services    Frequency 7X/week   Progress towards PT Goals Progress towards PT goals: Progressing toward goals  Plan Discharge plan needs to be updated    Precautions / Restrictions Precautions Precautions: Knee Required Braces or Orthoses: Knee Immobilizer - Left Knee Immobilizer - Left: On when out of bed or walking Restrictions Weight Bearing Restrictions: Yes LLE Weight Bearing: Weight bearing as tolerated   Pertinent Vitals/Pain See vitals tab Pain is limiting his tolerance to knee exercises/ROM   Mobility  Bed Mobility Overal bed mobility:  (not assessed, pt. up in recliner) Transfers Overall transfer level: Needs assistance Equipment used:  Rolling walker (2 wheeled) Transfers: Sit to/from Stand Sit to Stand: +2 physical assistance;Mod assist General transfer comment: Pt. had been seated in recliner for a while and reports "I am still".  He needed 2 mod assist to stand from recliner and mod assist to control descent to sitting.  Pt. cued to push off from recliner but insists to pull on RW with one hand while pushing with other hand.   Ambulation/Gait Ambulation/Gait assistance: Min assist;+2 safety/equipment Ambulation Distance (Feet): 65 Feet Assistive device: Rolling walker (2 wheeled) Gait Pattern/deviations: Step-to pattern Gait velocity: decreased General Gait Details: Pt.needed min of one for safety and stability while ambulating; second person to manage equipment    Exercises Total Joint Exercises Ankle Circles/Pumps: AROM;Both;10 reps Quad Sets: Both;10 reps;AROM Short Arc Quad: AAROM;Left;10 reps Hip ABduction/ADduction: AROM;Left;10 reps Straight Leg Raises: AAROM;Left;5 reps Long Arc Quad: AAROM;Left;5 reps Knee Flexion: AROM;Left;5 reps;Seated Goniometric ROM:  ~5 to 75   PT Diagnosis:    PT Problem List:   PT Treatment Interventions:     PT Goals (current goals can now be found in the care plan section)    Visit Information  Last PT Received On: 09/21/13 Assistance Needed: +2 History of Present Illness: [name]    Subjective Data  Subjective: "Can I talk you into getting me to one of those rehab places?" (following acute DC.  Pt. states he and wife have thought about it and desire for pt to go to rehab before he goes home   Cognition  Cognition Arousal/Alertness: Awake/alert Behavior During Therapy: WFL for tasks assessed/performed Overall Cognitive Status: Within Functional Limits for tasks assessed Memory: Decreased short-term memory    Balance     End of Session PT - End of Session Equipment Utilized During Treatment: Gait belt;Left knee immobilizer Activity Tolerance: Patient limited by  fatigue Patient left: in chair;with call bell/phone within reach Nurse Communication: Mobility status   GP     Jonathan [name] 09/21/2013, 12:15 PM Jonathan [name] PT [facility] [place] [phone]

## 2013-09-21 NOTE — Progress Notes (Signed)
Occupational Therapy Treatment Patient Details Name: Jonathan [name] MRN: [id number] DOB: Dec 02, 1935 Today's Date: 09/21/2013 Time: 0712-0736 OT Time Calculation (min): 24 min  OT Assessment / Plan / Recommendation  History of present illness [name]   OT comments  Pt. Feeling better this a.m., b.p. Is improved and pt. Is also more alert this a.m.  Decreased assistance required with bed mobility with simulated home set up. Also moving better with steps and pivotal transfers.  Inst. Cues still required for safe tech.  Reports wife has purchased a Secondary school teacher for LB dressing but declined practice this a.m.  Follow Up Recommendations  No OT follow up           Equipment Recommendations  None recommended by OT        Frequency Min 3X/week   Progress towards OT Goals Progress towards OT goals: Progressing toward goals  Plan Discharge plan remains appropriate    Precautions / Restrictions Precautions Precautions: Knee Required Braces or Orthoses: Knee Immobilizer - Left Knee Immobilizer - Left: On when out of bed or walking Restrictions LLE Weight Bearing: Weight bearing as tolerated   Pertinent Vitals/Pain 1/10, states meds were given prior to my arrival    ADL  Toilet Transfer: Simulated;Min guard;Minimal assistance Toilet Transfer Method: Sit to stand;Stand pivot Toileting - Clothing Manipulation and Hygiene: Simulated;Min guard;Minimal assistance Where Assessed - Camera operator Manipulation and Hygiene: Standing Equipment Used: Knee Immobilizer;Rolling walker Transfers/Ambulation Related to ADLs: sit/stand, stand/sit with min guard a and inst. cues for hand placement during transitions to walker ADL Comments: sim. toilet transfer, with approx. 3 steps and pivot to recliner min guard a, with intermittent cues for tech., states wife bought him a Secondary school teacher and he plans on "practicing with it" but declined use this a.m.    OT Goals(current goals can now be found in the care plan  section)    Visit Information  Last OT Received On: 09/21/13 History of Present Illness: [name]    Subjective Data   "i just want to use it because it is easier, i know i know" (visibly frustrated when trapeze moved out of reach)         Cognition  Cognition Arousal/Alertness: Awake/alert Behavior During Therapy: WFL for tasks assessed/performed Overall Cognitive Status: Within Functional Limits for tasks assessed    Mobility  Bed Mobility Overal bed mobility: Needs Assistance General bed mobility comments: pt. resistant to me removing the trapeze bar for his use, explained he needed to practice like a real bed at home.  pt. able to come out of bed with hob flat and no rails with cga and inst. cues for tech./sequencing Transfers Overall transfer level: Needs assistance Equipment used: Rolling walker (2 wheeled) Transfers: Sit to/from Stand Sit to Stand: Min guard;Min assist Stand pivot transfers: Min guard;Min assist General transfer comment: inst. cues for tech. and sequencing safe transfer              End of Session OT - End of Session Equipment Utilized During Treatment: Left knee immobilizer;Rolling walker Activity Tolerance: Patient tolerated treatment well Patient left: in chair;with call bell/phone within reach       Janice Coffin, COTA/L 09/21/2013, 7:48 AM

## 2013-09-21 NOTE — Discharge Planning (Signed)
Attempted to call report to [facility]  After being placed on hold and transferred twice, my call was disconnected.

## 2013-09-24 ENCOUNTER — Non-Acute Institutional Stay (SKILLED_NURSING_FACILITY): Payer: Medicare Other | Admitting: Adult Health

## 2013-09-24 DIAGNOSIS — Z87898 Personal history of other specified conditions: Secondary | ICD-10-CM

## 2013-09-24 DIAGNOSIS — M109 Gout, unspecified: Secondary | ICD-10-CM

## 2013-09-24 DIAGNOSIS — Z96659 Presence of unspecified artificial knee joint: Secondary | ICD-10-CM

## 2013-09-24 DIAGNOSIS — E785 Hyperlipidemia, unspecified: Secondary | ICD-10-CM

## 2013-09-24 DIAGNOSIS — I10 Essential (primary) hypertension: Secondary | ICD-10-CM | POA: Diagnosis not present

## 2013-09-24 DIAGNOSIS — E039 Hypothyroidism, unspecified: Secondary | ICD-10-CM

## 2013-09-24 DIAGNOSIS — R21 Rash and other nonspecific skin eruption: Secondary | ICD-10-CM

## 2013-09-24 DIAGNOSIS — E119 Type 2 diabetes mellitus without complications: Secondary | ICD-10-CM

## 2013-09-24 DIAGNOSIS — K5900 Constipation, unspecified: Secondary | ICD-10-CM

## 2013-09-25 ENCOUNTER — Encounter: Payer: Self-pay | Admitting: Adult Health

## 2013-09-27 DIAGNOSIS — K5900 Constipation, unspecified: Secondary | ICD-10-CM | POA: Insufficient documentation

## 2013-09-27 NOTE — Progress Notes (Signed)
Patient ID: Jonathan [name], male   DOB: [birth year], 78 y.o.   MRN: [id number]               PROGRESS NOTE  DATE: 09/24/2013  FACILITY: Nursing Home Location: [facility]  LEVEL OF CARE: SNF (31)  Acute Visit  CHIEF COMPLAINT:  Follow-up Hospitalization  HISTORY OF PRESENT ILLNESS: This is a 78 year old male who has been admitted to [facility] on 09/21/13 from [facility] with DJD S/P Left Total Knee Arthroplasty. He has been admitted for a short-term rehabilitation.  REASSESSMENT OF ONGOING PROBLEM(S):  HTN: Pt 's HTN remains stable.  Denies CP, sob, DOE, pedal edema, headaches, dizziness or visual disturbances.  No complications from the medications currently being used.  Last BP : 130/86  GOUT: Patien'st  gout remains stable. Patient denies joint pan, redness, swelling or warmth. No complications reported from the medications presently being used.  BPH: The patient's BPH remains stable. Patient denies urinary hesitancy or dribbling. No complications reported from the current medications being used.   PAST MEDICAL HISTORY : Reviewed.  No changes.  CURRENT MEDICATIONS: Reviewed per [facility]  REVIEW OF SYSTEMS:  GENERAL: no change in appetite, no fatigue, no weight changes, no fever, chills or weakness RESPIRATORY: no cough, SOB, DOE, wheezing, hemoptysis CARDIAC: no chest pain, edema or palpitations GI: no abdominal pain, diarrhea,  heart burn, nausea or vomiting, +constipation  PHYSICAL EXAMINATION  VS:  See VS section  GENERAL: no acute distress, normal body habitus SKIN: +erythematous papular rashes on back EYES: conjunctivae normal, sclerae normal, normal eye lids NECK: supple, trachea midline, no neck masses, no thyroid tenderness, no thyromegaly LYMPHATICS: no LAN in the neck, no supraclavicular LAN RESPIRATORY: breathing is even & unlabored, BS CTAB CARDIAC: RRR, no murmur,no extra heart sounds, no edema GI: abdomen soft, normal BS, no  masses, no tenderness, no hepatomegaly, no splenomegaly EXTREMITIES: able to move all 4 extremities without difficulty except LLE has limited mobility due to surgery PSYCHIATRIC: the patient is alert & oriented to person, affect & behavior appropriate  LABS/RADIOLOGY: Labs reviewed: Basic Metabolic Panel:  Recent Labs  11/30/12 1127 09/14/13 1300 09/19/13 0359  NA 138 142 139  K 4.4 4.2 4.0  CL 102 101 100  CO2 28 27 24   GLUCOSE 144* 149* 243*  BUN 15 23 13   CREATININE 0.9 1.01 0.98  CALCIUM 9.4 9.6 8.3*   Liver Function Tests:  Recent Labs  11/30/12 1127  AST 27  ALT 22  ALKPHOS 46  BILITOT 1.0  PROT 6.4  ALBUMIN 4.1    CBC:  Recent Labs  11/30/12 1127 09/14/13 1300 09/19/13 0359 09/20/13 0359 09/21/13 0705  WBC 4.9 6.5 8.0 10.7* 7.8  NEUTROABS 3.2 4.2  --   --   --   HGB 15.4 15.1 11.4* 10.2* 8.9*  HCT 45.3 42.9 33.1* 29.9* 25.6*  MCV 93.8 93.5 94.3 94.6 93.4  PLT 187.0 223 157 148* 139*    Lipid Panel:  Recent Labs  11/30/12 1127  HDL 52.20    CBG:  Recent Labs  09/20/13 2152 09/21/13 0642 09/21/13 1124  GLUCAP 108* 88 104*     ASSESSMENT/PLAN:  DJD S/P Left total knee arthroplasty - for rehabilitation Rashes - Apply hydrocortisone 1% cream to rashes on back BID x 2 weeks Constipation - give Magnesium citrate 296 ml PO X 1 Hypothyroidism - stable Gout - stable Hypertension  Well-controlled Hyperlipidemia - stable BPH - stable Diabetes Mellitus, type 2 -  continue Lantus 30 units SQ Q HS, hold for CBG <150     CPT CODE: 16109  Seth Bake - NP [facility] [phone]

## 2013-10-01 DIAGNOSIS — M25569 Pain in unspecified knee: Secondary | ICD-10-CM | POA: Diagnosis not present

## 2013-10-03 ENCOUNTER — Encounter: Payer: Self-pay | Admitting: Adult Health

## 2013-10-03 ENCOUNTER — Non-Acute Institutional Stay (SKILLED_NURSING_FACILITY): Payer: Medicare Other | Admitting: Adult Health

## 2013-10-03 DIAGNOSIS — I10 Essential (primary) hypertension: Secondary | ICD-10-CM

## 2013-10-03 DIAGNOSIS — Z96659 Presence of unspecified artificial knee joint: Secondary | ICD-10-CM | POA: Diagnosis not present

## 2013-10-03 DIAGNOSIS — E785 Hyperlipidemia, unspecified: Secondary | ICD-10-CM

## 2013-10-03 DIAGNOSIS — M109 Gout, unspecified: Secondary | ICD-10-CM

## 2013-10-03 DIAGNOSIS — E119 Type 2 diabetes mellitus without complications: Secondary | ICD-10-CM

## 2013-10-03 DIAGNOSIS — E039 Hypothyroidism, unspecified: Secondary | ICD-10-CM | POA: Diagnosis not present

## 2013-10-03 NOTE — Progress Notes (Signed)
Patient ID: Jonathan [name], male   DOB: [birth year], 78 y.o.   MRN: [id number]                   PROGRESS NOTE  DATE: 10/03/13  FACILITY: Nursing Home Location: [facility]  LEVEL OF CARE: SNF (31)  Acute Visit  CHIEF COMPLAINT:  Discharge Notes  HISTORY OF PRESENT ILLNESS: This is a 78 year old male who is for discharge home with outpatient care. He has been admitted to [facility] on 09/21/13 from [facility] with DJD S/P Left Total Knee Arthroplasty. Patient was admitted to this facility for short-term rehabilitation after the patient's recent hospitalization.  Patient has completed SNF rehabilitation and therapy has cleared the patient for discharge.  REASSESSMENT OF ONGOING PROBLEM(S):  HTN: Pt 's HTN remains stable.  Denies CP, sob, DOE, pedal edema, headaches, dizziness or visual disturbances.  No complications from the medications currently being used.  Last BP : 114/64  HYPERLIPIDEMIA: No complications from the medications presently being used.4/14 fasting lipid panel showed : Cholesterol 146 triglycerides 79 HDL 52.20 LDL 78  DM:pt's DM remains stable.  Pt denies polyuria, polydipsia, polyphagia, changes in vision or hypoglycemic episodes.  No complications noted from the medication presently being used.   4/14 hemoglobin A1c 6.5   PAST MEDICAL HISTORY : Reviewed.  No changes.  CURRENT MEDICATIONS: Reviewed per [facility]  REVIEW OF SYSTEMS:  GENERAL: no change in appetite, no fatigue, no weight changes, no fever, chills or weakness RESPIRATORY: no cough, SOB, DOE, wheezing, hemoptysis CARDIAC: no chest pain, edema or palpitations GI: no abdominal pain, diarrhea,  heart burn, nausea or vomiting, +constipation  PHYSICAL EXAMINATION  GENERAL: no acute distress, normal body habitus NECK: supple, trachea midline, no neck masses, no thyroid tenderness, no thyromegaly LYMPHATICS: no LAN in the neck, no supraclavicular LAN RESPIRATORY: breathing is even &  unlabored, BS CTAB CARDIAC: RRR, no murmur,no extra heart sounds, no edema GI: abdomen soft, normal BS, no masses, no tenderness, no hepatomegaly, no splenomegaly EXTREMITIES: able to move all 4 extremities without difficulty  PSYCHIATRIC: the patient is alert & oriented to person, affect & behavior appropriate  LABS/RADIOLOGY: Labs reviewed: Basic Metabolic Panel:  Recent Labs  11/30/12 1127 09/14/13 1300 09/19/13 0359  NA 138 142 139  K 4.4 4.2 4.0  CL 102 101 100  CO2 28 27 24   GLUCOSE 144* 149* 243*  BUN 15 23 13   CREATININE 0.9 1.01 0.98  CALCIUM 9.4 9.6 8.3*   Liver Function Tests:  Recent Labs  11/30/12 1127  AST 27  ALT 22  ALKPHOS 46  BILITOT 1.0  PROT 6.4  ALBUMIN 4.1    CBC:  Recent Labs  11/30/12 1127 09/14/13 1300 09/19/13 0359 09/20/13 0359 09/21/13 0705  WBC 4.9 6.5 8.0 10.7* 7.8  NEUTROABS 3.2 4.2  --   --   --   HGB 15.4 15.1 11.4* 10.2* 8.9*  HCT 45.3 42.9 33.1* 29.9* 25.6*  MCV 93.8 93.5 94.3 94.6 93.4  PLT 187.0 223 157 148* 139*    Lipid Panel:  Recent Labs  11/30/12 1127  HDL 52.20    CBG:  Recent Labs  09/20/13 2152 09/21/13 0642 09/21/13 1124  GLUCAP 108* 88 104*     ASSESSMENT/PLAN:  DJD S/P Left total knee arthroplasty - for outpatient care Hypothyroidism - stable Gout - stable Hypertension  Well-controlled Hyperlipidemia - stable BPH - stable Diabetes Mellitus, type 2 - well-controlled  I have filled out patient's  discharge paperwork and written prescriptions.  Patient will have outpatient care Total discharge time: Less than 30 minutes Discharge time involved coordination of the discharge process with social worker, nursing staff and therapy department. Medical justification for home health services verified.   CPT CODE: 74142  Seth Bake - NP [facility] [phone] male who is for discharge home with outpatient care. He has been admitted to [facility] on 09/21/13 from [facility] with DJD S/P Left Total Knee Arthroplasty. Patient was admitted to this facility for short-term rehabilitation after the patient's recent hospitalization.  Patient has completed SNF rehabilitation and therapy has cleared the patient for discharge.  REASSESSMENT OF ONGOING PROBLEM(S):  HTN: Pt 's HTN remains stable.  Denies CP, sob, DOE, pedal edema, headaches, dizziness or visual disturbances.  No complications from the medications currently being used.  Last BP : 114/64  HYPERLIPIDEMIA: No complications from the medications presently being used.4/14 fasting lipid panel showed : Cholesterol 146 triglycerides 79 HDL 52.20 LDL 78  DM:pt's DM remains stable.  Pt denies polyuria, polydipsia, polyphagia, changes in vision or hypoglycemic episodes.  No complications noted from the medication presently being used.   4/14 hemoglobin A1c 6.5   PAST MEDICAL HISTORY : Reviewed.  No changes.  CURRENT MEDICATIONS: Reviewed per [facility]  REVIEW OF SYSTEMS:  GENERAL: no change in appetite, no fatigue, no weight changes, no fever, chills or weakness RESPIRATORY: no cough, SOB, DOE, wheezing, hemoptysis CARDIAC: no chest pain, edema or palpitations GI: no abdominal pain, diarrhea,  heart burn, nausea or vomiting, +constipation  PHYSICAL EXAMINATION  GENERAL: no acute distress, normal body habitus NECK: supple, trachea midline, no neck masses, no thyroid tenderness, no thyromegaly LYMPHATICS: no LAN in the neck, no supraclavicular LAN RESPIRATORY: breathing is even &  unlabored, BS CTAB CARDIAC: RRR, no murmur,no extra heart sounds, no edema GI: abdomen soft, normal BS, no masses, no tenderness, no hepatomegaly, no splenomegaly EXTREMITIES: able to move all 4 extremities without difficulty  PSYCHIATRIC: the patient is alert & oriented to person, affect & behavior appropriate  LABS/RADIOLOGY: Labs reviewed: Basic Metabolic Panel:  Recent Labs  11/30/12 1127 09/14/13 1300 09/19/13 0359  NA 138 142 139  K 4.4 4.2 4.0  CL 102 101 100  CO2 28 27 24   GLUCOSE 144* 149* 243*  BUN 15 23 13   CREATININE 0.9 1.01 0.98  CALCIUM 9.4 9.6 8.3*   Liver Function Tests:  Recent Labs  11/30/12 1127  AST 27  ALT 22  ALKPHOS 46  BILITOT 1.0  PROT 6.4  ALBUMIN 4.1    CBC:  Recent Labs  11/30/12 1127 09/14/13 1300 09/19/13 0359 09/20/13 0359 09/21/13 0705  WBC 4.9 6.5 8.0 10.7* 7.8  NEUTROABS 3.2 4.2  --   --   --   HGB 15.4 15.1 11.4* 10.2* 8.9*  HCT 45.3 42.9 33.1* 29.9* 25.6*  MCV 93.8 93.5 94.3 94.6 93.4  PLT 187.0 223 157 148* 139*    Lipid Panel:  Recent Labs  11/30/12 1127  HDL 52.20    CBG:  Recent Labs  09/20/13 2152 09/21/13 0642 09/21/13 1124  GLUCAP 108* 88 104*     ASSESSMENT/PLAN:  DJD S/P Left total knee arthroplasty - for outpatient care Hypothyroidism - stable Gout - stable Hypertension  Well-controlled Hyperlipidemia - stable BPH - stable Diabetes Mellitus, type 2 - well-controlled  I have filled out patient's  discharge paperwork and written prescriptions.  Patient will have outpatient care Total discharge time: Less than 30 minutes Discharge time involved coordination of the discharge process with social worker, nursing staff and therapy department. Medical justification for home health services verified.   CPT CODE: 74142  Seth Bake - NP [facility] [phone]

## 2013-10-05 ENCOUNTER — Encounter: Payer: Self-pay | Admitting: *Deleted

## 2013-10-05 ENCOUNTER — Encounter: Payer: Self-pay | Admitting: Internal Medicine

## 2013-10-05 ENCOUNTER — Non-Acute Institutional Stay (SKILLED_NURSING_FACILITY): Payer: Medicare Other | Admitting: Internal Medicine

## 2013-10-05 DIAGNOSIS — I2510 Atherosclerotic heart disease of native coronary artery without angina pectoris: Secondary | ICD-10-CM

## 2013-10-05 DIAGNOSIS — M109 Gout, unspecified: Secondary | ICD-10-CM

## 2013-10-05 DIAGNOSIS — E039 Hypothyroidism, unspecified: Secondary | ICD-10-CM

## 2013-10-05 DIAGNOSIS — Z96659 Presence of unspecified artificial knee joint: Secondary | ICD-10-CM | POA: Diagnosis not present

## 2013-10-05 DIAGNOSIS — E785 Hyperlipidemia, unspecified: Secondary | ICD-10-CM

## 2013-10-05 DIAGNOSIS — I10 Essential (primary) hypertension: Secondary | ICD-10-CM | POA: Diagnosis not present

## 2013-10-05 DIAGNOSIS — E119 Type 2 diabetes mellitus without complications: Secondary | ICD-10-CM

## 2013-10-05 NOTE — Progress Notes (Signed)
MRN: [id number] Name: Jonathan [name]  Sex: male Age: 78 y.o. DOB: [birth year]  [place] #: [address] Facility/Room: 701 Level Of Care: SNF Provider: Inocencio Homes D Emergency Contacts: Extended Emergency Contact Information Primary Emergency Contact: Hsia,Margaret Address: [place], [place] Phone: [phone] Mobile Phone: [phone] Relation: Spouse   Allergies: Review of patient's allergies indicates no known allergies.  Chief Complaint  Patient presents with  . nursing home admisdsion    HPI: Patient is 78 y.o. male who is admitted for OT/PT after L knee replacement.  Past Medical History  Diagnosis Date  . CAD (coronary artery disease)     CABG 2005 by Dr Cyndia Bent  . Hyperlipidemia   . Hypertension   . Hypothyroidism   . Sun-damaged skin   . Benign prostatic hypertrophy   . Gout   . Glomerulonephritis acute     at age 35, due to beta strep, resolved, renal calculi- passed spontaneously  . DJD (degenerative joint disease)     bilateral knee, gout  . Hepatitis A 1950  . Hepatitis B 1980's  . Type II diabetes mellitus   . Sleep apnea 1990's    "before uvulectomy"  . GERD (gastroesophageal reflux disease)   . Squamous cell carcinoma     "lots on my arms & face" (09/21/2013)  . Lack of coordination   . Muscle weakness (generalized)   . Abnormality of gait   . Personal history of other infectious and parasitic disease   . Knee joint replacement by other means     Past Surgical History  Procedure Laterality Date  . Coronary artery bypass graft  01/23/04    Dr Cyndia Bent  . Inguinal hernia repair Bilateral 1980's - 1990's  . Umbilical hernia repair    . Mastoidectomy Right 1943  . Quadriceps repair Right 2009  . Uvulopalatoplasty  [id number]    ENT MD  . Total knee arthroplasty Left 09/18/2013    Procedure: TOTAL KNEE ARTHROPLASTY;  Surgeon: Hessie Dibble, MD;  Location: [place];  Service: Orthopedics;   Laterality: Left;  . Cataract extraction w/ intraocular lens  implant, bilateral Bilateral   . Retinal detachment surgery Right X 2  . Eye surgery    . Cardiac catheterization    . Tonsillectomy  ~ 1943      Medication List       This list is accurate as of: 10/05/13  8:23 PM.  Always use your most recent med list.               allopurinol 300 MG tablet  Commonly known as:  ZYLOPRIM  Take 1 tablet (300 mg total) by mouth daily.     aspirin 325 MG EC tablet  Take 325 mg by mouth daily.     cetirizine 10 MG tablet  Commonly known as:  ZYRTEC  Take 10 mg by mouth daily as needed for allergies.     FREESTYLE LITE test strip  Generic drug:  glucose blood  USE TWICE A DAY AS DIRECTED     FREESTYLE LITE TEST VI  1 each by Other route 2 (two) times daily.     glipiZIDE 10 MG tablet  Commonly known as:  GLUCOTROL  Take 1 tablet (10 mg total) by mouth 2 (two) times daily.     insulin glargine 100 UNIT/ML injection  Commonly known as:  LANTUS  Inject 30 Units into the skin  at bedtime.     levothyroxine 100 MCG tablet  Commonly known as:  SYNTHROID, LEVOTHROID  Take 1 tablet (100 mcg total) by mouth every morning.     lisinopril-hydrochlorothiazide 20-25 MG per tablet  Commonly known as:  PRINZIDE,ZESTORETIC  Take 1 tablet by mouth daily.     metFORMIN 1000 MG tablet  Commonly known as:  GLUCOPHAGE  Take 1 tablet (1,000 mg total) by mouth 2 (two) times daily with a meal.     methocarbamol 500 MG tablet  Commonly known as:  ROBAXIN  Take 1 tablet (500 mg total) by mouth every 6 (six) hours as needed for muscle spasms.     metoprolol succinate 25 MG 24 hr tablet  Commonly known as:  TOPROL-XL  Take 25 mg by mouth daily.     MULTIVITAMIN PO  Take 1 tablet by mouth daily.     MYRBETRIQ 50 MG Tb24 tablet  Generic drug:  mirabegron ER  Take 50 mg by mouth daily before breakfast.     niacin 100 MG tablet  Take 100 mg by mouth daily.     nitroGLYCERIN 0.4 MG SL  tablet  Commonly known as:  NITROSTAT  Place 1 tablet (0.4 mg total) under the tongue as needed.     Omega-3 Fatty Acids 350 MG Caps  Take 700 mg by mouth daily before breakfast.     omeprazole 20 MG capsule  Commonly known as:  PRILOSEC  Take 80 mg by mouth daily as needed.     oxyCODONE 5 MG immediate release tablet  Commonly known as:  Oxy IR/ROXICODONE  Take 1-2 tablets (5-10 mg total) by mouth every 3 (three) hours as needed for breakthrough pain.     pioglitazone 45 MG tablet  Commonly known as:  ACTOS  TAKE ONE TABLET BY MOUTH DAILY     polycarbophil 625 MG tablet  Commonly known as:  FIBERCON  Take 625 mg by mouth as needed.     simvastatin 40 MG tablet  Commonly known as:  ZOCOR  Take 1 tablet (40 mg total) by mouth at bedtime.     terazosin 1 MG capsule  Commonly known as:  HYTRIN  Take 1 mg by mouth at bedtime as needed (overactive bladder).        No orders of the defined types were placed in this encounter.    Immunization History  Administered Date(s) Administered  . H1N1 07/15/2008  . Influenza Split 06/25/2011, 06/30/2012  . Influenza Whole 05/30/2007, 05/21/2008, 07/25/2009  . Influenza, High Dose Seasonal PF 06/06/2013  . PPD Test 09/21/2013  . Pneumococcal Polysaccharide-23 08/09/2005  . Td 08/09/2004  . Zoster 06/27/2008    History  Substance Use Topics  . Smoking status: Former Smoker -- 1.00 packs/day for 10 years    Types: Cigarettes    Quit date: 09/09/1957  . Smokeless tobacco: Not on file  . Alcohol Use: 8.4 oz/week    14 Shots of liquor per week     Comment: 1 2 oz shots of vodka per night    Family history is noncontributory    Review of Systems  DATA OBTAINED: from patient GENERAL: Feels well no fevers, fatigue, appetite changes SKIN: No itching, rash or wounds EYES: No eye pain, redness, discharge EARS: No earache, tinnitus, change in hearing NOSE: No congestion, drainage or bleeding  MOUTH/THROAT: No mouth or tooth  pain, No sore throat, No difficulty chewing or swallowing  RESPIRATORY: No cough, wheezing, SOB CARDIAC: No chest pain,  palpitations, lower extremity edema  GI: No abdominal pain, No N/V/D or constipation, No heartburn or reflux  GU: No dysuria, frequency or urgency, or incontinence  MUSCULOSKELETAL: No unrelieved bone/joint pain NEUROLOGIC: No headache, dizziness or focal weakness PSYCHIATRIC: No overt anxiety or sadness. Sleeps well. No behavior issue.   Filed Vitals:   10/05/13 1454  BP: 100/61  Pulse: 80  Temp: 97.5 F (36.4 C)  Resp: 19    Physical Exam  GENERAL APPEARANCE: Alert, conversant. Appropriately groomed. No acute distress.  SKIN: No diaphoresis rash; midline knee wound healing without TTP HEAD: Normocephalic, atraumatic  EYES: Conjunctiva/lids clear. Pupils round, reactive. EOMs intact.  EARS: External exam WNL, canals clear. Hearing grossly normal.  NOSE: No deformity or discharge.  MOUTH/THROAT: Lips w/o lesions. Mouth and throat normal RESPIRATORY: Breathing is even, unlabored. Lung sounds are clear   CARDIOVASCULAR: Heart RRR no murmurs, rubs or gallops. No peripheral edema.  GASTROINTESTINAL: Abdomen is soft, non-tender, not distended w/ normal bowel sounds GENITOURINARY: Bladder non tender, not distended  MUSCULOSKELETAL: No abnormal joints or musculature NEUROLOGIC: Oriented X3. Cranial nerves 2-12 grossly intact. Moves all extremities no tremor. PSYCHIATRIC: Mood and affect appropriate to situation, no behavioral issues  Patient Active Problem List   Diagnosis Date Noted  . Constipation 09/27/2013  . Total knee replacement status 09/18/2013  . Knee pain, bilateral 11/30/2012  . Foreign body granuloma of soft tissue, NEC, right lower leg 11/30/2012  . Edema 10/25/2011  . CAD 06/26/2009  . RENAL CALCULUS, RECURRENT 01/03/2009  . BENIGN PROSTATIC HYPERTROPHY, HX OF 05/30/2007  . HYPOTHYROIDISM 04/07/2007  . DIABETES MELLITUS, TYPE II 04/07/2007  .  HYPERLIPIDEMIA 04/07/2007  . GOUT 04/07/2007  . HYPERTENSION 04/07/2007  . DERMATITIS, CNTCT, ACUTE D/T SOLAR RADIATION 04/07/2007  . HEPATITIS B, HX OF 04/07/2007    CBC    Component Value Date/Time   WBC 7.8 09/21/2013 0705   RBC 2.74* 09/21/2013 0705   HGB 8.9* 09/21/2013 0705   HCT 25.6* 09/21/2013 0705   PLT 139* 09/21/2013 0705   MCV 93.4 09/21/2013 0705   LYMPHSABS 1.6 09/14/2013 1300   MONOABS 0.6 09/14/2013 1300   EOSABS 0.0 09/14/2013 1300   BASOSABS 0.0 09/14/2013 1300    CMP     Component Value Date/Time   NA 139 09/19/2013 0359   K 4.0 09/19/2013 0359   CL 100 09/19/2013 0359   CO2 24 09/19/2013 0359   GLUCOSE 243* 09/19/2013 0359   BUN 13 09/19/2013 0359   CREATININE 0.98 09/19/2013 0359   CALCIUM 8.3* 09/19/2013 0359   PROT 6.4 11/30/2012 1127   ALBUMIN 4.1 11/30/2012 1127   AST 27 11/30/2012 1127   ALT 22 11/30/2012 1127   ALKPHOS 46 11/30/2012 1127   BILITOT 1.0 11/30/2012 1127   GFRNONAA 78* 09/19/2013 0359   GFRAA >90 09/19/2013 0359    Assessment and Plan  Total knee replacement status S/p multiple years of conservative manangment;pain meds,robaxin and ASA BID for prophylaxis  HYPERTENSION Continue Toprol, prinzide and hytrin  HYPOTHYROIDISM Continue levothyroxin 100 mcg daily  HYPERLIPIDEMIA LDL 78,HDL 50 on Zocor 40 and omega 3  DIABETES MELLITUS, TYPE II One 3 po meds plus insulin;on ACE and statin; HbA1c 6.5 10 months ago  GOUT Continue allopurinol  CAD ASA, bblocker,statin, NTG SL prn    Hennie Duos, MD

## 2013-10-05 NOTE — Assessment & Plan Note (Signed)
Continue levothyroxin 100 mcg daily

## 2013-10-05 NOTE — Assessment & Plan Note (Signed)
ASA, bblocker,statin, NTG SL prn

## 2013-10-05 NOTE — Assessment & Plan Note (Signed)
S/p multiple years of conservative manangment;pain meds,robaxin and ASA BID for prophylaxis

## 2013-10-05 NOTE — Assessment & Plan Note (Addendum)
LDL 78,HDL 50 on Zocor 40 and omega 3

## 2013-10-05 NOTE — Assessment & Plan Note (Signed)
One 3 po meds plus insulin;on ACE and statin; HbA1c 6.5 10 months ago

## 2013-10-05 NOTE — Assessment & Plan Note (Addendum)
Continue Toprol, prinzide and hytrin

## 2013-10-05 NOTE — Assessment & Plan Note (Signed)
Continue allopurinol 

## 2013-10-08 DIAGNOSIS — M25569 Pain in unspecified knee: Secondary | ICD-10-CM | POA: Diagnosis not present

## 2013-10-08 DIAGNOSIS — M25669 Stiffness of unspecified knee, not elsewhere classified: Secondary | ICD-10-CM | POA: Diagnosis not present

## 2013-10-08 DIAGNOSIS — Z96659 Presence of unspecified artificial knee joint: Secondary | ICD-10-CM | POA: Diagnosis not present

## 2013-10-08 DIAGNOSIS — R262 Difficulty in walking, not elsewhere classified: Secondary | ICD-10-CM | POA: Diagnosis not present

## 2013-10-10 DIAGNOSIS — M25569 Pain in unspecified knee: Secondary | ICD-10-CM | POA: Diagnosis not present

## 2013-10-10 DIAGNOSIS — M25669 Stiffness of unspecified knee, not elsewhere classified: Secondary | ICD-10-CM | POA: Diagnosis not present

## 2013-10-10 DIAGNOSIS — R262 Difficulty in walking, not elsewhere classified: Secondary | ICD-10-CM | POA: Diagnosis not present

## 2013-10-10 DIAGNOSIS — Z96659 Presence of unspecified artificial knee joint: Secondary | ICD-10-CM | POA: Diagnosis not present

## 2013-10-11 DIAGNOSIS — M25569 Pain in unspecified knee: Secondary | ICD-10-CM | POA: Diagnosis not present

## 2013-10-11 DIAGNOSIS — Z96659 Presence of unspecified artificial knee joint: Secondary | ICD-10-CM | POA: Diagnosis not present

## 2013-10-11 DIAGNOSIS — R262 Difficulty in walking, not elsewhere classified: Secondary | ICD-10-CM | POA: Diagnosis not present

## 2013-10-11 DIAGNOSIS — M25669 Stiffness of unspecified knee, not elsewhere classified: Secondary | ICD-10-CM | POA: Diagnosis not present

## 2013-10-15 DIAGNOSIS — M25669 Stiffness of unspecified knee, not elsewhere classified: Secondary | ICD-10-CM | POA: Diagnosis not present

## 2013-10-15 DIAGNOSIS — M25569 Pain in unspecified knee: Secondary | ICD-10-CM | POA: Diagnosis not present

## 2013-10-15 DIAGNOSIS — R262 Difficulty in walking, not elsewhere classified: Secondary | ICD-10-CM | POA: Diagnosis not present

## 2013-10-15 DIAGNOSIS — Z96659 Presence of unspecified artificial knee joint: Secondary | ICD-10-CM | POA: Diagnosis not present

## 2013-10-17 DIAGNOSIS — R262 Difficulty in walking, not elsewhere classified: Secondary | ICD-10-CM | POA: Diagnosis not present

## 2013-10-17 DIAGNOSIS — M25669 Stiffness of unspecified knee, not elsewhere classified: Secondary | ICD-10-CM | POA: Diagnosis not present

## 2013-10-17 DIAGNOSIS — M25569 Pain in unspecified knee: Secondary | ICD-10-CM | POA: Diagnosis not present

## 2013-10-17 DIAGNOSIS — Z96659 Presence of unspecified artificial knee joint: Secondary | ICD-10-CM | POA: Diagnosis not present

## 2013-10-22 DIAGNOSIS — R262 Difficulty in walking, not elsewhere classified: Secondary | ICD-10-CM | POA: Diagnosis not present

## 2013-10-22 DIAGNOSIS — Z96659 Presence of unspecified artificial knee joint: Secondary | ICD-10-CM | POA: Diagnosis not present

## 2013-10-22 DIAGNOSIS — M25569 Pain in unspecified knee: Secondary | ICD-10-CM | POA: Diagnosis not present

## 2013-10-22 DIAGNOSIS — M25669 Stiffness of unspecified knee, not elsewhere classified: Secondary | ICD-10-CM | POA: Diagnosis not present

## 2013-10-25 DIAGNOSIS — M25559 Pain in unspecified hip: Secondary | ICD-10-CM | POA: Diagnosis not present

## 2013-10-25 DIAGNOSIS — M25669 Stiffness of unspecified knee, not elsewhere classified: Secondary | ICD-10-CM | POA: Diagnosis not present

## 2013-10-25 DIAGNOSIS — R262 Difficulty in walking, not elsewhere classified: Secondary | ICD-10-CM | POA: Diagnosis not present

## 2013-10-25 DIAGNOSIS — Z96659 Presence of unspecified artificial knee joint: Secondary | ICD-10-CM | POA: Diagnosis not present

## 2013-10-29 DIAGNOSIS — M25559 Pain in unspecified hip: Secondary | ICD-10-CM | POA: Diagnosis not present

## 2013-10-29 DIAGNOSIS — M25669 Stiffness of unspecified knee, not elsewhere classified: Secondary | ICD-10-CM | POA: Diagnosis not present

## 2013-10-29 DIAGNOSIS — R262 Difficulty in walking, not elsewhere classified: Secondary | ICD-10-CM | POA: Diagnosis not present

## 2013-10-29 DIAGNOSIS — Z96659 Presence of unspecified artificial knee joint: Secondary | ICD-10-CM | POA: Diagnosis not present

## 2013-11-05 DIAGNOSIS — Z96659 Presence of unspecified artificial knee joint: Secondary | ICD-10-CM | POA: Diagnosis not present

## 2013-11-05 DIAGNOSIS — M25669 Stiffness of unspecified knee, not elsewhere classified: Secondary | ICD-10-CM | POA: Diagnosis not present

## 2013-11-05 DIAGNOSIS — R262 Difficulty in walking, not elsewhere classified: Secondary | ICD-10-CM | POA: Diagnosis not present

## 2013-11-05 DIAGNOSIS — M25569 Pain in unspecified knee: Secondary | ICD-10-CM | POA: Diagnosis not present

## 2013-11-06 ENCOUNTER — Other Ambulatory Visit: Payer: Self-pay | Admitting: Family Medicine

## 2013-11-07 ENCOUNTER — Telehealth: Payer: Self-pay | Admitting: Family Medicine

## 2013-11-07 MED ORDER — TERAZOSIN HCL 1 MG PO CAPS: 1.0000 mg | ORAL_CAPSULE | Freq: Every evening | ORAL | Status: DC | PRN

## 2013-11-07 NOTE — Telephone Encounter (Signed)
[place], [facility] [address] requesting a re-fill on terazosin (HYTRIN) 1 MG capsule

## 2013-11-13 DIAGNOSIS — M25569 Pain in unspecified knee: Secondary | ICD-10-CM | POA: Diagnosis not present

## 2013-11-13 DIAGNOSIS — M25669 Stiffness of unspecified knee, not elsewhere classified: Secondary | ICD-10-CM | POA: Diagnosis not present

## 2013-11-13 DIAGNOSIS — R262 Difficulty in walking, not elsewhere classified: Secondary | ICD-10-CM | POA: Diagnosis not present

## 2013-11-13 DIAGNOSIS — Z96659 Presence of unspecified artificial knee joint: Secondary | ICD-10-CM | POA: Diagnosis not present

## 2013-11-14 DIAGNOSIS — M25569 Pain in unspecified knee: Secondary | ICD-10-CM | POA: Diagnosis not present

## 2013-11-14 DIAGNOSIS — M25669 Stiffness of unspecified knee, not elsewhere classified: Secondary | ICD-10-CM | POA: Diagnosis not present

## 2013-11-15 ENCOUNTER — Other Ambulatory Visit: Payer: Self-pay

## 2013-11-23 DIAGNOSIS — Z96659 Presence of unspecified artificial knee joint: Secondary | ICD-10-CM | POA: Diagnosis not present

## 2013-11-23 DIAGNOSIS — R262 Difficulty in walking, not elsewhere classified: Secondary | ICD-10-CM | POA: Diagnosis not present

## 2013-11-23 DIAGNOSIS — M25569 Pain in unspecified knee: Secondary | ICD-10-CM | POA: Diagnosis not present

## 2013-11-23 DIAGNOSIS — M25669 Stiffness of unspecified knee, not elsewhere classified: Secondary | ICD-10-CM | POA: Diagnosis not present

## 2013-11-26 DIAGNOSIS — M25669 Stiffness of unspecified knee, not elsewhere classified: Secondary | ICD-10-CM | POA: Diagnosis not present

## 2013-11-26 DIAGNOSIS — R262 Difficulty in walking, not elsewhere classified: Secondary | ICD-10-CM | POA: Diagnosis not present

## 2013-11-26 DIAGNOSIS — Z96659 Presence of unspecified artificial knee joint: Secondary | ICD-10-CM | POA: Diagnosis not present

## 2013-11-26 DIAGNOSIS — M25569 Pain in unspecified knee: Secondary | ICD-10-CM | POA: Diagnosis not present

## 2013-12-03 DIAGNOSIS — R262 Difficulty in walking, not elsewhere classified: Secondary | ICD-10-CM | POA: Diagnosis not present

## 2013-12-03 DIAGNOSIS — Z96659 Presence of unspecified artificial knee joint: Secondary | ICD-10-CM | POA: Diagnosis not present

## 2013-12-03 DIAGNOSIS — M25669 Stiffness of unspecified knee, not elsewhere classified: Secondary | ICD-10-CM | POA: Diagnosis not present

## 2013-12-03 DIAGNOSIS — M25569 Pain in unspecified knee: Secondary | ICD-10-CM | POA: Diagnosis not present

## 2013-12-04 ENCOUNTER — Encounter: Payer: Self-pay | Admitting: Family Medicine

## 2013-12-04 ENCOUNTER — Ambulatory Visit (INDEPENDENT_AMBULATORY_CARE_PROVIDER_SITE_OTHER): Payer: Medicare Other | Admitting: Family Medicine

## 2013-12-04 ENCOUNTER — Other Ambulatory Visit: Payer: Self-pay | Admitting: Family Medicine

## 2013-12-04 VITALS — BP 110/80 | HR 72 | Temp 97.7°F | Ht 73.5 in | Wt 234.0 lb

## 2013-12-04 DIAGNOSIS — R609 Edema, unspecified: Secondary | ICD-10-CM

## 2013-12-04 DIAGNOSIS — I10 Essential (primary) hypertension: Secondary | ICD-10-CM

## 2013-12-04 DIAGNOSIS — E119 Type 2 diabetes mellitus without complications: Secondary | ICD-10-CM | POA: Diagnosis not present

## 2013-12-04 DIAGNOSIS — H5440 Blindness, one eye, unspecified eye: Secondary | ICD-10-CM

## 2013-12-04 DIAGNOSIS — Z87898 Personal history of other specified conditions: Secondary | ICD-10-CM

## 2013-12-04 DIAGNOSIS — E785 Hyperlipidemia, unspecified: Secondary | ICD-10-CM | POA: Diagnosis not present

## 2013-12-04 DIAGNOSIS — Z23 Encounter for immunization: Secondary | ICD-10-CM

## 2013-12-04 DIAGNOSIS — Z96659 Presence of unspecified artificial knee joint: Secondary | ICD-10-CM | POA: Diagnosis not present

## 2013-12-04 DIAGNOSIS — E039 Hypothyroidism, unspecified: Secondary | ICD-10-CM | POA: Diagnosis not present

## 2013-12-04 DIAGNOSIS — M109 Gout, unspecified: Secondary | ICD-10-CM

## 2013-12-04 DIAGNOSIS — I2510 Atherosclerotic heart disease of native coronary artery without angina pectoris: Secondary | ICD-10-CM

## 2013-12-04 DIAGNOSIS — N059 Unspecified nephritic syndrome with unspecified morphologic changes: Secondary | ICD-10-CM

## 2013-12-04 DIAGNOSIS — N400 Enlarged prostate without lower urinary tract symptoms: Secondary | ICD-10-CM

## 2013-12-04 LAB — POCT URINALYSIS DIPSTICK
Blood, UA: NEGATIVE
Glucose, UA: NEGATIVE
KETONES UA: NEGATIVE
LEUKOCYTES UA: NEGATIVE
NITRITE UA: NEGATIVE
PROTEIN UA: NEGATIVE
Spec Grav, UA: >=1.03
Urobilinogen, UA: 0.2
pH, UA: 5.5

## 2013-12-04 LAB — CBC WITH DIFFERENTIAL/PLATELET
BASOS ABS: 0 10*3/uL (ref 0.0–0.1)
Basophils Relative: 0.1 % (ref 0.0–3.0)
Eosinophils Absolute: 0.1 10*3/uL (ref 0.0–0.7)
Eosinophils Relative: 1 % (ref 0.0–5.0)
HCT: 43 % (ref 39.0–52.0)
Hemoglobin: 14.6 g/dL (ref 13.0–17.0)
Lymphocytes Relative: 25.4 % (ref 12.0–46.0)
Lymphs Abs: 1.5 10*3/uL (ref 0.7–4.0)
MCHC: 33.9 g/dL (ref 30.0–36.0)
MCV: 92.4 fl (ref 78.0–100.0)
MONOS PCT: 10.1 % (ref 3.0–12.0)
Monocytes Absolute: 0.6 10*3/uL (ref 0.1–1.0)
Neutro Abs: 3.8 10*3/uL (ref 1.4–7.7)
Neutrophils Relative %: 63.4 % (ref 43.0–77.0)
PLATELETS: 208 10*3/uL (ref 150.0–400.0)
RBC: 4.66 Mil/uL (ref 4.22–5.81)
RDW: 14.8 % — AB (ref 11.5–14.6)
WBC: 5.9 10*3/uL (ref 4.5–10.5)

## 2013-12-04 LAB — LIPID PANEL
Cholesterol: 133 mg/dL (ref 0–200)
HDL: 55.8 mg/dL (ref >39.00)
LDL Cholesterol: 63 mg/dL (ref 0–99)
Total CHOL/HDL Ratio: 2
Triglycerides: 69 mg/dL (ref 0.0–149.0)
VLDL: 13.8 mg/dL (ref 0.0–40.0)

## 2013-12-04 LAB — MICROALBUMIN / CREATININE URINE RATIO
Creatinine,U: 290.5 mg/dL
MICROALB/CREAT RATIO: 0.8 mg/g (ref 0.0–30.0)
Microalb, Ur: 2.2 mg/dL — ABNORMAL HIGH (ref 0.0–1.9)

## 2013-12-04 LAB — BASIC METABOLIC PANEL
BUN: 13 mg/dL (ref 6–23)
CALCIUM: 10 mg/dL (ref 8.4–10.5)
CHLORIDE: 104 meq/L (ref 96–112)
CO2: 26 mEq/L (ref 19–32)
CREATININE: 1 mg/dL (ref 0.4–1.5)
GFR: 80.05 mL/min (ref >60.00)
Glucose, Bld: 170 mg/dL — ABNORMAL HIGH (ref 70–99)
Potassium: 3.8 mEq/L (ref 3.5–5.1)
Sodium: 139 mEq/L (ref 135–145)

## 2013-12-04 LAB — HEPATIC FUNCTION PANEL
ALT: 14 U/L (ref 0–53)
AST: 22 U/L (ref 0–37)
Albumin: 4.1 g/dL (ref 3.5–5.2)
Alkaline Phosphatase: 53 U/L (ref 39–117)
BILIRUBIN TOTAL: 0.8 mg/dL (ref 0.3–1.2)
Bilirubin, Direct: 0.2 mg/dL (ref 0.0–0.3)
Total Protein: 6.3 g/dL (ref 6.0–8.3)

## 2013-12-04 LAB — HEMOGLOBIN A1C: Hgb A1c MFr Bld: 6.1 % (ref 4.6–6.5)

## 2013-12-04 LAB — TSH: TSH: 1.49 u[IU]/mL (ref 0.35–5.50)

## 2013-12-04 LAB — PSA: PSA: 2.51 ng/mL (ref 0.10–4.00)

## 2013-12-04 MED ORDER — TERAZOSIN HCL 1 MG PO CAPS: 1.0000 mg | ORAL_CAPSULE | Freq: Every evening | ORAL | Status: DC | PRN

## 2013-12-04 MED ORDER — INSULIN GLARGINE 100 UNIT/ML ~~LOC~~ SOLN: 30.0000 [IU] | Freq: Every day | SUBCUTANEOUS | Status: DC

## 2013-12-04 MED ORDER — NITROGLYCERIN 0.4 MG SL SUBL: 0.4000 mg | SUBLINGUAL_TABLET | SUBLINGUAL | Status: DC | PRN

## 2013-12-04 MED ORDER — METFORMIN HCL 1000 MG PO TABS: 1000.0000 mg | ORAL_TABLET | Freq: Two times a day (BID) | ORAL | Status: DC

## 2013-12-04 MED ORDER — LEVOTHYROXINE SODIUM 100 MCG PO TABS: 100.0000 ug | ORAL_TABLET | ORAL | Status: DC

## 2013-12-04 MED ORDER — OMEPRAZOLE 20 MG PO CPDR: 80.0000 mg | DELAYED_RELEASE_CAPSULE | Freq: Every day | ORAL | Status: DC | PRN

## 2013-12-04 MED ORDER — LISINOPRIL-HYDROCHLOROTHIAZIDE 20-25 MG PO TABS: 1.0000 | ORAL_TABLET | Freq: Every day | ORAL | Status: DC

## 2013-12-04 MED ORDER — ALLOPURINOL 300 MG PO TABS: 300.0000 mg | ORAL_TABLET | Freq: Every day | ORAL | Status: DC

## 2013-12-04 MED ORDER — GLIPIZIDE 10 MG PO TABS: 10.0000 mg | ORAL_TABLET | Freq: Two times a day (BID) | ORAL | Status: DC

## 2013-12-04 MED ORDER — PIOGLITAZONE HCL 45 MG PO TABS: ORAL_TABLET | ORAL | Status: DC

## 2013-12-04 MED ORDER — PIOGLITAZONE HCL 45 MG PO TABS: 45.0000 mg | ORAL_TABLET | Freq: Every day | ORAL | Status: DC

## 2013-12-04 MED ORDER — SIMVASTATIN 40 MG PO TABS: 40.0000 mg | ORAL_TABLET | Freq: Every day | ORAL | Status: DC

## 2013-12-04 NOTE — Progress Notes (Signed)
Pre visit review using our clinic review tool, if applicable. No additional management support is needed unless otherwise documented below in the visit note. 

## 2013-12-04 NOTE — Progress Notes (Signed)
   Subjective:    Patient ID: Jonathan [name], male    DOB: July 05, 1936, 78 y.o.   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  Cognitive function normal he walks on a regular basis now that he has a new left knee. That was done 3 months ago., Home health safety reviewed no issues identified, he does not have guns in the house, he does have a health care power of attorney and living will  His problem list reviewed there've been no changes. Except for the total left knee replacement.   Review of Systems  Constitutional: Negative.   HENT: Negative.   Eyes: Negative.   Respiratory: Negative.   Cardiovascular: Negative.   Gastrointestinal: Negative.   Genitourinary: Negative.   Musculoskeletal: Negative.   Skin: Negative.   Neurological: Negative.   Psychiatric/Behavioral: Negative.        Objective:   Physical Exam  Nursing note and vitals reviewed. Constitutional: He is oriented to person, place, and time. He appears well-developed and well-nourished.  HENT:  Head: Normocephalic and atraumatic.  Right Ear: External ear normal.  Left Ear: External ear normal.  Nose: Nose normal.  Mouth/Throat: Oropharynx is clear and moist.  Eyes: Conjunctivae and EOM are normal. Pupils are equal, round, and reactive to light. Right eye exhibits no discharge. Left eye exhibits no discharge. Scleral icterus is present.  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 Neck supple. No JVD present. No tracheal deviation present. No thyromegaly present.  Cardiovascular: Normal rate, regular rhythm, normal heart sounds and intact distal pulses.  Exam reveals no gallop and no friction rub.   No murmur heard. No carotid nor aortic bruits  peripheral pulses 1+ and symmetrical  Pulmonary/Chest: Effort normal and breath sounds normal. No stridor. No respiratory distress. He has no wheezes. He has no rales. He exhibits no tenderness.  Scar midline chest from bypass surgery 10 years ago  Abdominal: Soft. Bowel sounds are normal. He exhibits no distension and no mass. There is no tenderness. There is no rebound and no guarding.  Genitourinary:  Genitourinary exam done by urologist therefore not repeated  Musculoskeletal: Normal range of motion. He exhibits no edema and no tenderness.  Lymphadenopathy:    He has no cervical adenopathy.  Neurological: He is alert and oriented to person, place, and time. He has normal reflexes. No cranial nerve deficit. He exhibits normal muscle tone.  Skin: Skin is warm and dry. No rash noted. No erythema. No pallor.  Psychiatric: He has a normal mood and affect. His behavior is normal. Judgment and thought content normal.          Assessment & Plan:  Hypertension at goal continue current therapy  Hypothyroidism continue Synthroid  Diabetes type 2 check labs  Hyperlipidemia continue current medication  Coronary disease asymptomatic........ 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

## 2013-12-04 NOTE — Patient Instructions (Signed)
Continue your current medications  I will call you I gets her lab work back 

## 2013-12-05 ENCOUNTER — Telehealth: Payer: Self-pay | Admitting: Family Medicine

## 2013-12-05 DIAGNOSIS — R262 Difficulty in walking, not elsewhere classified: Secondary | ICD-10-CM | POA: Diagnosis not present

## 2013-12-05 DIAGNOSIS — Z96659 Presence of unspecified artificial knee joint: Secondary | ICD-10-CM | POA: Diagnosis not present

## 2013-12-05 DIAGNOSIS — M25669 Stiffness of unspecified knee, not elsewhere classified: Secondary | ICD-10-CM | POA: Diagnosis not present

## 2013-12-05 DIAGNOSIS — M25569 Pain in unspecified knee: Secondary | ICD-10-CM | POA: Diagnosis not present

## 2013-12-05 NOTE — Telephone Encounter (Signed)
Relevant patient education assigned to patient using Emmi. ° °

## 2013-12-06 ENCOUNTER — Other Ambulatory Visit: Payer: Self-pay | Admitting: *Deleted

## 2013-12-06 ENCOUNTER — Encounter: Payer: Medicare Other | Admitting: Family Medicine

## 2013-12-06 ENCOUNTER — Encounter: Payer: Self-pay | Admitting: *Deleted

## 2013-12-06 DIAGNOSIS — I10 Essential (primary) hypertension: Secondary | ICD-10-CM

## 2013-12-06 DIAGNOSIS — K219 Gastro-esophageal reflux disease without esophagitis: Secondary | ICD-10-CM | POA: Insufficient documentation

## 2013-12-06 MED ORDER — OMEPRAZOLE 20 MG PO CPDR: 20.0000 mg | DELAYED_RELEASE_CAPSULE | Freq: Two times a day (BID) | ORAL | Status: DC

## 2013-12-07 ENCOUNTER — Telehealth: Payer: Self-pay

## 2013-12-07 NOTE — Telephone Encounter (Signed)
Relevant patient education assigned to patient using Emmi. ° °

## 2013-12-19 DIAGNOSIS — M25569 Pain in unspecified knee: Secondary | ICD-10-CM | POA: Diagnosis not present

## 2014-01-02 DIAGNOSIS — M25569 Pain in unspecified knee: Secondary | ICD-10-CM | POA: Diagnosis not present

## 2014-01-02 DIAGNOSIS — M25669 Stiffness of unspecified knee, not elsewhere classified: Secondary | ICD-10-CM | POA: Diagnosis not present

## 2014-01-13 ENCOUNTER — Other Ambulatory Visit: Payer: Self-pay | Admitting: Family Medicine

## 2014-01-25 DIAGNOSIS — M25569 Pain in unspecified knee: Secondary | ICD-10-CM | POA: Diagnosis not present

## 2014-01-25 DIAGNOSIS — M25669 Stiffness of unspecified knee, not elsewhere classified: Secondary | ICD-10-CM | POA: Diagnosis not present

## 2014-01-25 DIAGNOSIS — Z96659 Presence of unspecified artificial knee joint: Secondary | ICD-10-CM | POA: Diagnosis not present

## 2014-01-25 DIAGNOSIS — R262 Difficulty in walking, not elsewhere classified: Secondary | ICD-10-CM | POA: Diagnosis not present

## 2014-02-01 ENCOUNTER — Ambulatory Visit: Payer: Medicare Other | Admitting: Internal Medicine

## 2014-02-04 ENCOUNTER — Encounter: Payer: Self-pay | Admitting: Internal Medicine

## 2014-02-04 ENCOUNTER — Ambulatory Visit (INDEPENDENT_AMBULATORY_CARE_PROVIDER_SITE_OTHER): Payer: Medicare Other | Admitting: Internal Medicine

## 2014-02-04 VITALS — BP 132/84 | HR 72 | Ht 74.5 in | Wt 234.2 lb

## 2014-02-04 DIAGNOSIS — I440 Atrioventricular block, first degree: Secondary | ICD-10-CM

## 2014-02-04 DIAGNOSIS — E785 Hyperlipidemia, unspecified: Secondary | ICD-10-CM

## 2014-02-04 DIAGNOSIS — I059 Rheumatic mitral valve disease, unspecified: Secondary | ICD-10-CM | POA: Diagnosis not present

## 2014-02-04 DIAGNOSIS — I2510 Atherosclerotic heart disease of native coronary artery without angina pectoris: Secondary | ICD-10-CM | POA: Diagnosis not present

## 2014-02-04 DIAGNOSIS — I340 Nonrheumatic mitral (valve) insufficiency: Secondary | ICD-10-CM

## 2014-02-04 MED ORDER — METOPROLOL SUCCINATE ER 25 MG PO TB24: 12.5000 mg | ORAL_TABLET | Freq: Every day | ORAL | Status: DC

## 2014-02-04 NOTE — Patient Instructions (Signed)
Your physician wants you to follow-up in: 12 months with Dr Vallery Ridge will receive a reminder letter in the mail two months in advance. If you don't receive a letter, please call our office to schedule the follow-up appointment.   Your physician has recommended you make the following change in your medication:  1) Decrease Metoprolol to 12.5mg  daily  Get an EKG to follow up on PR   Your physician has requested that you have an echocardiogram. Echocardiography is a painless test that uses sound waves to create images of your heart. It provides your doctor with information about the size and shape of your heart and how well your heart's chambers and valves are working. This procedure takes approximately one hour. There are no restrictions for this procedure.

## 2014-02-04 NOTE — Progress Notes (Signed)
PCP: Joycelyn Man, MD  Jonathan [name] is a 78 y.o. male who presents today for routine cardiology followup.  Since last being seen in our clinic, the patient reports doing very well.  Today, he denies symptoms of palpitations, chest pain, shortness of breath,  lower extremity edema, dizziness, presyncope, or syncope.  The patient is otherwise without complaint today.   Past Medical History  Diagnosis Date  . CAD (coronary artery disease)     CABG 2005 by Dr Cyndia Bent  . Hyperlipidemia   . Hypertension   . Hypothyroidism   . Sun-damaged skin   . Benign prostatic hypertrophy   . Gout   . Glomerulonephritis acute     at age 18, due to beta strep, resolved, renal calculi- passed spontaneously  . DJD (degenerative joint disease)     bilateral knee, gout  . Hepatitis A 1950  . Hepatitis B 1980's  . Type II diabetes mellitus   . Sleep apnea 1990's    "before uvulectomy"  . GERD (gastroesophageal reflux disease)   . Squamous cell carcinoma     "lots on my arms & face" (09/21/2013)  . Lack of coordination   . Muscle weakness (generalized)   . Abnormality of gait   . Personal history of other infectious and parasitic disease   . Knee joint replacement by other means    Past Surgical History  Procedure Laterality Date  . Coronary artery bypass graft  01/23/04    Dr Cyndia Bent  . Inguinal hernia repair Bilateral 1980's - 1990's  . Umbilical hernia repair    . Mastoidectomy Right 1943  . Quadriceps repair Right 2009  . Uvulopalatoplasty  [id number]    ENT MD  . Total knee arthroplasty Left 09/18/2013    Procedure: TOTAL KNEE ARTHROPLASTY;  Surgeon: Hessie Dibble, MD;  Location: [facility];  Service: Orthopedics;  Laterality: Left;  . Cataract extraction w/ intraocular lens  implant, bilateral Bilateral   . Retinal detachment surgery Right X 2  . Eye surgery    . Cardiac catheterization    . Tonsillectomy  ~ 1943    Current Outpatient Prescriptions  Medication Sig Dispense Refill  .  allopurinol (ZYLOPRIM) 300 MG tablet Take 1 tablet (300 mg total) by mouth daily.  100 tablet  3  . aspirin 325 MG EC tablet Take 325 mg by mouth daily.      . cetirizine (ZYRTEC) 10 MG tablet Take 10 mg by mouth daily as needed for allergies.      . furosemide (LASIX) 20 MG tablet TAKE ONE TABLET BY MOUTH EVERY DAY  100 tablet  3  . glipiZIDE (GLUCOTROL) 10 MG tablet Take 1 tablet (10 mg total) by mouth 2 (two) times daily.  200 tablet  3  . insulin glargine (LANTUS) 100 UNIT/ML injection Inject 0.3 mLs (30 Units total) into the skin at bedtime.  10 mL  10  . levothyroxine (SYNTHROID, LEVOTHROID) 100 MCG tablet Take 1 tablet (100 mcg total) by mouth every morning.  100 tablet  3  . lisinopril-hydrochlorothiazide (PRINZIDE,ZESTORETIC) 20-25 MG per tablet Take 1 tablet by mouth daily.  100 tablet  3  . metFORMIN (GLUCOPHAGE) 1000 MG tablet Take 1 tablet (1,000 mg total) by mouth 2 (two) times daily with a meal.  200 tablet  4  . metoprolol succinate (TOPROL-XL) 25 MG 24 hr tablet Take 0.5 tablets (12.5 mg total) by mouth daily.  45 tablet  3  . mirabegron ER (MYRBETRIQ) 50 MG TB24 tablet  Take 50 mg by mouth daily before breakfast.       . Multiple Vitamin (MULTIVITAMIN PO) Take 1 tablet by mouth daily.        . niacin 100 MG tablet Take 100 mg by mouth daily.        . nitroGLYCERIN (NITROSTAT) 0.4 MG SL tablet Place 1 tablet (0.4 mg total) under the tongue as needed.  25 tablet  1  . Omega-3 Fatty Acids 350 MG CAPS Take 700 mg by mouth daily before breakfast.       . omeprazole (PRILOSEC) 20 MG capsule Take 1 capsule (20 mg total) by mouth 2 (two) times daily before a meal.  180 capsule  3  . oxyCODONE (OXY IR/ROXICODONE) 5 MG immediate release tablet Take 1-2 tablets (5-10 mg total) by mouth every 3 (three) hours as needed for breakthrough pain.  80 tablet  0  . pioglitazone (ACTOS) 45 MG tablet Take 1 tablet (45 mg total) by mouth daily.  90 tablet  3  . polycarbophil (FIBERCON) 625 MG tablet Take  625 mg by mouth as needed.       . simvastatin (ZOCOR) 40 MG tablet Take 1 tablet (40 mg total) by mouth at bedtime.  100 tablet  3  . terazosin (HYTRIN) 1 MG capsule Take 1 capsule (1 mg total) by mouth at bedtime as needed (overactive bladder).  90 capsule  3   No current facility-administered medications for this visit.   ROS- all systems are reviewed and negative except as per HPI above  Physical Exam: Filed Vitals:   02/04/14 1304  BP: 132/84  Pulse: 72  Height: 6' 2.5" (1.892 m)  Weight: 234 lb 3.2 oz (106.232 kg)    GEN- The patient is well appearing, alert and oriented x 3 today.   Head- normocephalic, atraumatic Eyes-  Sclera clear, conjunctiva pink Ears- hearing intact Oropharynx- clear Lungs- Clear to ausculation bilaterally, normal work of breathing Heart- Regular rate and rhythm, no murmurs, rubs or gallops, PMI not laterally displaced GI- soft, NT, ND, + BS Extremities- no clubbing, cyanosis, or edema  ekg today reveals sinus rhythm 72 bpm, PR 270 msec, nonspecific ST/T changes Echo/myoview from 2013 were reviewed with the patient in detail today  Assessment and Plan:  1. CAD Stable, without ischemic symptoms No further workup at this time Given stable but prolonged first degree AV block, I will decrease metoprolol xl from 25mg  to 12.5mg  daily today He wishes to have repeat ekg by pcp (for convenience) in 2 weeks just to see if there is any impact on PR with reduced toprol No indication for pacing at this time  2. Mild to moderate MR Repeat echo at this time for follow-up.  He really does not have a prominent murmur on exam  3. HL Recent lipids are reviewed  Return in 1 year He should contact my office if problems arise in the interim

## 2014-02-13 ENCOUNTER — Other Ambulatory Visit: Payer: Self-pay | Admitting: Family Medicine

## 2014-02-25 ENCOUNTER — Ambulatory Visit (HOSPITAL_COMMUNITY): Payer: Medicare Other | Attending: Cardiovascular Disease

## 2014-02-25 DIAGNOSIS — N400 Enlarged prostate without lower urinary tract symptoms: Secondary | ICD-10-CM | POA: Insufficient documentation

## 2014-02-25 DIAGNOSIS — E119 Type 2 diabetes mellitus without complications: Secondary | ICD-10-CM | POA: Insufficient documentation

## 2014-02-25 DIAGNOSIS — I340 Nonrheumatic mitral (valve) insufficiency: Secondary | ICD-10-CM

## 2014-02-25 DIAGNOSIS — I2510 Atherosclerotic heart disease of native coronary artery without angina pectoris: Secondary | ICD-10-CM | POA: Insufficient documentation

## 2014-02-25 DIAGNOSIS — I059 Rheumatic mitral valve disease, unspecified: Secondary | ICD-10-CM | POA: Diagnosis not present

## 2014-02-25 DIAGNOSIS — G4733 Obstructive sleep apnea (adult) (pediatric): Secondary | ICD-10-CM | POA: Insufficient documentation

## 2014-02-25 DIAGNOSIS — M2550 Pain in unspecified joint: Secondary | ICD-10-CM | POA: Diagnosis not present

## 2014-02-25 DIAGNOSIS — Z96659 Presence of unspecified artificial knee joint: Secondary | ICD-10-CM | POA: Insufficient documentation

## 2014-02-25 DIAGNOSIS — R5383 Other fatigue: Secondary | ICD-10-CM

## 2014-02-25 DIAGNOSIS — E039 Hypothyroidism, unspecified: Secondary | ICD-10-CM | POA: Diagnosis not present

## 2014-02-25 DIAGNOSIS — I10 Essential (primary) hypertension: Secondary | ICD-10-CM | POA: Diagnosis not present

## 2014-02-25 DIAGNOSIS — B159 Hepatitis A without hepatic coma: Secondary | ICD-10-CM | POA: Diagnosis not present

## 2014-02-25 DIAGNOSIS — E785 Hyperlipidemia, unspecified: Secondary | ICD-10-CM | POA: Insufficient documentation

## 2014-02-25 DIAGNOSIS — B1910 Unspecified viral hepatitis B without hepatic coma: Secondary | ICD-10-CM | POA: Diagnosis not present

## 2014-02-25 DIAGNOSIS — I359 Nonrheumatic aortic valve disorder, unspecified: Secondary | ICD-10-CM | POA: Insufficient documentation

## 2014-02-25 DIAGNOSIS — M109 Gout, unspecified: Secondary | ICD-10-CM | POA: Insufficient documentation

## 2014-02-25 DIAGNOSIS — R5381 Other malaise: Secondary | ICD-10-CM | POA: Insufficient documentation

## 2014-02-25 DIAGNOSIS — K219 Gastro-esophageal reflux disease without esophagitis: Secondary | ICD-10-CM | POA: Insufficient documentation

## 2014-02-25 DIAGNOSIS — I517 Cardiomegaly: Secondary | ICD-10-CM | POA: Diagnosis not present

## 2014-02-25 NOTE — Progress Notes (Signed)
2D Echo completed. 02/25/2014  

## 2014-02-26 ENCOUNTER — Encounter: Payer: Self-pay | Admitting: Family Medicine

## 2014-02-26 ENCOUNTER — Ambulatory Visit (INDEPENDENT_AMBULATORY_CARE_PROVIDER_SITE_OTHER): Payer: Medicare Other | Admitting: Family Medicine

## 2014-02-26 VITALS — BP 120/72 | HR 77 | Wt 223.0 lb

## 2014-02-26 DIAGNOSIS — E785 Hyperlipidemia, unspecified: Secondary | ICD-10-CM | POA: Diagnosis not present

## 2014-02-26 DIAGNOSIS — R351 Nocturia: Secondary | ICD-10-CM | POA: Diagnosis not present

## 2014-02-26 DIAGNOSIS — I440 Atrioventricular block, first degree: Secondary | ICD-10-CM

## 2014-02-26 DIAGNOSIS — I10 Essential (primary) hypertension: Secondary | ICD-10-CM | POA: Diagnosis not present

## 2014-02-26 NOTE — Progress Notes (Signed)
Garret Reddish, MD Phone: [phone]  Subjective:   Jonathan [name] is a 78 y.o. year old very pleasant male patient who presents with the following:  1st degree AV block Noted by cardiology on 02/04/14. Metoprolol XL was decreased to 12.5mg  daily with plan for PCP follow up. Patient asymptomatic at that time and today  ROS- no chest pain or shortness of breath or palpitations  Past Medical History- CAD s/p CABG, GERD, first degree AV block, hypothyroidism, DM type II, HLD, HTN, gout  Medications- reviewed and updated Current Outpatient Prescriptions  Medication Sig Dispense Refill  . allopurinol (ZYLOPRIM) 300 MG tablet Take 1 tablet (300 mg total) by mouth daily.  100 tablet  3  . aspirin 325 MG EC tablet Take 325 mg by mouth daily.      . benazepril-hydrochlorthiazide (LOTENSIN HCT) 20-25 MG per tablet TAKE 1 TABLET EVERY DAY  90 tablet  3  . cetirizine (ZYRTEC) 10 MG tablet Take 10 mg by mouth daily as needed for allergies.      . furosemide (LASIX) 20 MG tablet TAKE ONE TABLET BY MOUTH EVERY DAY  100 tablet  3  . glipiZIDE (GLUCOTROL) 10 MG tablet Take 1 tablet (10 mg total) by mouth 2 (two) times daily.  200 tablet  3  . insulin glargine (LANTUS) 100 UNIT/ML injection Inject 0.3 mLs (30 Units total) into the skin at bedtime.  10 mL  10  . levothyroxine (SYNTHROID, LEVOTHROID) 100 MCG tablet Take 1 tablet (100 mcg total) by mouth every morning.  100 tablet  3  . lisinopril-hydrochlorothiazide (PRINZIDE,ZESTORETIC) 20-25 MG per tablet Take 1 tablet by mouth daily.  100 tablet  3  . metFORMIN (GLUCOPHAGE) 1000 MG tablet Take 1 tablet (1,000 mg total) by mouth 2 (two) times daily with a meal.  200 tablet  4  . metoprolol succinate (TOPROL-XL) 25 MG 24 hr tablet Take 0.5 tablets (12.5 mg total) by mouth daily.  45 tablet  3  . mirabegron ER (MYRBETRIQ) 50 MG TB24 tablet Take 50 mg by mouth daily before breakfast.       . Multiple Vitamin (MULTIVITAMIN PO) Take 1 tablet by mouth daily.         . niacin 100 MG tablet Take 100 mg by mouth daily.        . nitroGLYCERIN (NITROSTAT) 0.4 MG SL tablet Place 1 tablet (0.4 mg total) under the tongue as needed.  25 tablet  1  . Omega-3 Fatty Acids 350 MG CAPS Take 700 mg by mouth daily before breakfast.       . omeprazole (PRILOSEC) 20 MG capsule Take 1 capsule (20 mg total) by mouth 2 (two) times daily before a meal.  180 capsule  3  . oxyCODONE (OXY IR/ROXICODONE) 5 MG immediate release tablet Take 1-2 tablets (5-10 mg total) by mouth every 3 (three) hours as needed for breakthrough pain.  80 tablet  0  . pioglitazone (ACTOS) 45 MG tablet Take 1 tablet (45 mg total) by mouth daily.  90 tablet  3  . polycarbophil (FIBERCON) 625 MG tablet Take 625 mg by mouth as needed.       . simvastatin (ZOCOR) 40 MG tablet Take 1 tablet (40 mg total) by mouth at bedtime.  100 tablet  3  . terazosin (HYTRIN) 1 MG capsule Take 1 capsule (1 mg total) by mouth at bedtime as needed (overactive bladder).  90 capsule  3   No current facility-administered medications for this visit.  Objective: BP 120/72  Pulse 77  Wt 223 lb (101.152 kg) Gen: NAD, resting comfortably CV: regular rate Lungs: nonlabored  EKG 02/26/14: Sinus rhythm. Rate 77. PR interval not prolonged. Nonspecific st/t wave changes persist but unchanged.   Assessment/Plan:  First degree AV block Noted in EKG back to 2012. Patient reports no symptoms. Resolved with decreased metoprolol XL to 12.5mg  starting 02/04/14. Continue current dosing. Will forward information to Dr. Rayann Heman of cardiology.

## 2014-02-26 NOTE — Patient Instructions (Addendum)
Pr interval is much improved at 0.19 from 0.27 with decreased metoprolol. Continue current dose.   Will forward to Dr. Rayann Heman

## 2014-02-26 NOTE — Progress Notes (Signed)
Pre visit review using our clinic review tool, if applicable. No additional management support is needed unless otherwise documented below in the visit note. 

## 2014-02-26 NOTE — Assessment & Plan Note (Addendum)
Noted in EKG back to 2012. Patient reports no symptoms. Resolved with decreased metoprolol XL to 12.5mg  starting 02/04/14. Continue current dosing. Will forward information to Dr. Rayann Heman of cardiology.

## 2014-04-15 ENCOUNTER — Other Ambulatory Visit: Payer: Self-pay | Admitting: Family Medicine

## 2014-04-18 ENCOUNTER — Encounter: Payer: Self-pay | Admitting: Family Medicine

## 2014-04-18 DIAGNOSIS — H26499 Other secondary cataract, unspecified eye: Secondary | ICD-10-CM | POA: Diagnosis not present

## 2014-04-18 DIAGNOSIS — E119 Type 2 diabetes mellitus without complications: Secondary | ICD-10-CM | POA: Diagnosis not present

## 2014-04-18 DIAGNOSIS — Z961 Presence of intraocular lens: Secondary | ICD-10-CM | POA: Diagnosis not present

## 2014-04-18 DIAGNOSIS — Z794 Long term (current) use of insulin: Secondary | ICD-10-CM | POA: Diagnosis not present

## 2014-04-18 DIAGNOSIS — H33059 Total retinal detachment, unspecified eye: Secondary | ICD-10-CM | POA: Diagnosis not present

## 2014-04-18 LAB — HM DIABETES EYE EXAM

## 2014-05-22 DIAGNOSIS — M1711 Unilateral primary osteoarthritis, right knee: Secondary | ICD-10-CM | POA: Diagnosis not present

## 2014-05-22 DIAGNOSIS — Z96652 Presence of left artificial knee joint: Secondary | ICD-10-CM | POA: Diagnosis not present

## 2014-06-07 DIAGNOSIS — Z23 Encounter for immunization: Secondary | ICD-10-CM | POA: Diagnosis not present

## 2014-06-14 ENCOUNTER — Encounter: Payer: Self-pay | Admitting: Internal Medicine

## 2014-06-20 ENCOUNTER — Other Ambulatory Visit: Payer: Self-pay | Admitting: Orthopaedic Surgery

## 2014-08-12 ENCOUNTER — Other Ambulatory Visit (HOSPITAL_COMMUNITY): Payer: Medicare Other

## 2014-08-13 NOTE — Pre-Procedure Instructions (Signed)
Jonathan [name]  08/13/2014   Your procedure is scheduled on:  Tues, Jan 12 @ 7:30 AM  Report to [facility]  at 5:30 AM.  Call this number if you have problems the morning of surgery: [phone]   Remember:   Do not eat food or drink liquids after midnight.   Take these medicines the morning of surgery with A SIP OF WATER: Allopurinol(Zyloprim),Zyrtec(Cetirizine),Synthroid(Levothyroxine),Metoprolol(Toprol),Omeprazole(Prilosec),and Pain Pill(if needed)              Stop taking your Aspirin. No Goody's,BC's,Aleve,Ibuprofen,Fish Oil,or any Herbal Medications   Do not wear jewelry  Do not wear lotions, powders, or colognes. You may wear deodorant.  Men may shave face and neck.  Do not bring valuables to the hospital.                [facility] is not responsible                  for any belongings or valuables.               Contacts, dentures or bridgework may not be worn into surgery.  Leave suitcase in the car. After surgery it may be brought to your room.  For patients admitted to the hospital, discharge time is determined by your                treatment team.               Patients discharged the day of surgery will not be allowed to drive  home.    Special Instructions:  [place] - Preparing for Surgery  Before surgery, you can play an important role.  Because skin is not sterile, your skin needs to be as free of germs as possible.  You can reduce the number of germs on you skin by washing with CHG (chlorahexidine gluconate) soap before surgery.  CHG is an antiseptic cleaner which kills germs and bonds with the skin to continue killing germs even after washing.  Please DO NOT use if you have an allergy to CHG or antibacterial soaps.  If your skin becomes reddened/irritated stop using the CHG and inform your nurse when you arrive at Short Stay.  Do not shave (including legs and underarms) for at least 48 hours prior to the first CHG shower.  You may shave your  face.  Please follow these instructions carefully:   1.  Shower with CHG Soap the night before surgery and the                                morning of Surgery.  2.  If you choose to wash your hair, wash your hair first as usual with your       normal shampoo.  3.  After you shampoo, rinse your hair and body thoroughly to remove the                      Shampoo.  4.  Use CHG as you would any other liquid soap.  You can apply chg directly       to the skin and wash gently with scrungie or a clean washcloth.  5.  Apply the CHG Soap to your body ONLY FROM THE NECK DOWN.        Do not use on open wounds or open sores.  Avoid contact with your eyes,  ears, mouth and genitals (private parts).  Wash genitals (private parts)       with your normal soap.  6.  Wash thoroughly, paying special attention to the area where your surgery        will be performed.  7.  Thoroughly rinse your body with warm water from the neck down.  8.  DO NOT shower/wash with your normal soap after using and rinsing off       the CHG Soap.  9.  Pat yourself dry with a clean towel.            10.  Wear clean pajamas.            11.  Place clean sheets on your bed the night of your first shower and do not        sleep with pets.  Day of Surgery  Do not apply any lotions/deoderants the morning of surgery.  Please wear clean clothes to the hospital/surgery center.     Please read over the following fact sheets that you were given: Pain Booklet, Coughing and Deep Breathing, Blood Transfusion Information, MRSA Information and Surgical Site Infection Prevention

## 2014-08-14 ENCOUNTER — Encounter (HOSPITAL_COMMUNITY): Payer: Self-pay

## 2014-08-14 ENCOUNTER — Encounter (HOSPITAL_COMMUNITY)
Admission: RE | Admit: 2014-08-14 | Discharge: 2014-08-14 | Disposition: A | Discharge status: Home / Self Care | Payer: Medicare Other | Source: Ambulatory Visit | Attending: Orthopaedic Surgery | Admitting: Orthopaedic Surgery

## 2014-08-14 ENCOUNTER — Ambulatory Visit (HOSPITAL_COMMUNITY): Admission: RE | Admit: 2014-08-14 | Payer: Medicare Other | Source: Ambulatory Visit

## 2014-08-14 DIAGNOSIS — Z7982 Long term (current) use of aspirin: Secondary | ICD-10-CM | POA: Diagnosis not present

## 2014-08-14 DIAGNOSIS — R262 Difficulty in walking, not elsewhere classified: Secondary | ICD-10-CM | POA: Diagnosis not present

## 2014-08-14 DIAGNOSIS — M1711 Unilateral primary osteoarthritis, right knee: Secondary | ICD-10-CM | POA: Insufficient documentation

## 2014-08-14 DIAGNOSIS — Z951 Presence of aortocoronary bypass graft: Secondary | ICD-10-CM | POA: Insufficient documentation

## 2014-08-14 DIAGNOSIS — M25561 Pain in right knee: Secondary | ICD-10-CM | POA: Diagnosis not present

## 2014-08-14 DIAGNOSIS — Z01812 Encounter for preprocedural laboratory examination: Secondary | ICD-10-CM | POA: Insufficient documentation

## 2014-08-14 DIAGNOSIS — Z01818 Encounter for other preprocedural examination: Secondary | ICD-10-CM

## 2014-08-14 DIAGNOSIS — Z794 Long term (current) use of insulin: Secondary | ICD-10-CM | POA: Diagnosis not present

## 2014-08-14 DIAGNOSIS — E119 Type 2 diabetes mellitus without complications: Secondary | ICD-10-CM | POA: Diagnosis not present

## 2014-08-14 DIAGNOSIS — Z79899 Other long term (current) drug therapy: Secondary | ICD-10-CM | POA: Diagnosis not present

## 2014-08-14 DIAGNOSIS — I10 Essential (primary) hypertension: Secondary | ICD-10-CM | POA: Diagnosis not present

## 2014-08-14 HISTORY — DX: Unspecified urinary incontinence: R32

## 2014-08-14 HISTORY — DX: Unspecified hearing loss, unspecified ear: H91.90

## 2014-08-14 LAB — CBC WITH DIFFERENTIAL/PLATELET
BASOS ABS: 0 10*3/uL (ref 0.0–0.1)
BASOS PCT: 1 % (ref 0–1)
EOS PCT: 2 % (ref 0–5)
Eosinophils Absolute: 0.1 10*3/uL (ref 0.0–0.7)
HCT: 42.8 % (ref 39.0–52.0)
Hemoglobin: 14.6 g/dL (ref 13.0–17.0)
LYMPHS ABS: 1.2 10*3/uL (ref 0.7–4.0)
Lymphocytes Relative: 30 % (ref 12–46)
MCH: 31.1 pg (ref 26.0–34.0)
MCHC: 34.1 g/dL (ref 30.0–36.0)
MCV: 91.3 fL (ref 78.0–100.0)
MONO ABS: 0.4 10*3/uL (ref 0.1–1.0)
Monocytes Relative: 10 % (ref 3–12)
NEUTROS ABS: 2.3 10*3/uL (ref 1.7–7.7)
NEUTROS PCT: 57 % (ref 43–77)
PLATELETS: 167 10*3/uL (ref 150–400)
RBC: 4.69 MIL/uL (ref 4.22–5.81)
RDW: 14.3 % (ref 11.5–15.5)
WBC: 4 10*3/uL (ref 4.0–10.5)

## 2014-08-14 LAB — TYPE AND SCREEN
ABO/RH(D): O POS
ANTIBODY SCREEN: NEGATIVE

## 2014-08-14 LAB — BASIC METABOLIC PANEL
Anion gap: 10 (ref 5–15)
BUN: 15 mg/dL (ref 6–23)
CO2: 26 mmol/L (ref 19–32)
CREATININE: 0.99 mg/dL (ref 0.50–1.35)
Calcium: 9.6 mg/dL (ref 8.4–10.5)
Chloride: 104 mEq/L (ref 96–112)
GFR calc non Af Amer: 78 mL/min — ABNORMAL LOW (ref >90)
GFR, EST AFRICAN AMERICAN: 90 mL/min — AB (ref >90)
GLUCOSE: 182 mg/dL — AB (ref 70–99)
Potassium: 4 mmol/L (ref 3.5–5.1)
Sodium: 140 mmol/L (ref 135–145)

## 2014-08-14 LAB — APTT: aPTT: 28 seconds (ref 24–37)

## 2014-08-14 LAB — SURGICAL PCR SCREEN
MRSA, PCR: NEGATIVE
Staphylococcus aureus: NEGATIVE

## 2014-08-14 LAB — PROTIME-INR
INR: 1.07 (ref 0.00–1.49)
PROTHROMBIN TIME: 14 s (ref 11.6–15.2)

## 2014-08-14 NOTE — H&P (Signed)
TOTAL KNEE ADMISSION H&P  Patient is being admitted for right total knee arthroplasty.  Subjective:  Chief Complaint:right knee pain.  HPI: Jonathan [name], 79 y.o. male, has a history of pain and functional disability in the right knee due to arthritis and has failed non-surgical conservative treatments for greater than 12 weeks to includeNSAID's and/or analgesics, corticosteriod injections, flexibility and strengthening excercises, supervised PT with diminished ADL's post treatment, weight reduction as appropriate and activity modification.  Onset of symptoms was gradual, starting 8 years ago with gradually worsening course since that time. The patient noted no past surgery on the right knee(s).  Patient currently rates pain in the right knee(s) at 10 out of 10 with activity. Patient has night pain, worsening of pain with activity and weight bearing, pain that interferes with activities of daily living, pain with passive range of motion, crepitus and joint swelling.  Patient has evidence of subchondral sclerosis, periarticular osteophytes and joint space narrowing by imaging studies. This patient has had no. There is no active infection.  Patient Active Problem List   Diagnosis Date Noted  . First degree AV block 02/04/2014  . GERD (gastroesophageal reflux disease) 12/06/2013  . Blindness of right eye 12/04/2013  . Constipation 09/27/2013  . Total knee replacement status 09/18/2013  . Knee pain, bilateral 11/30/2012  . Foreign body granuloma of soft tissue, NEC, right lower leg 11/30/2012  . Edema 10/25/2011  . CAD 06/26/2009  . RENAL CALCULUS, RECURRENT 01/03/2009  . BENIGN PROSTATIC HYPERTROPHY, HX OF 05/30/2007  . HYPOTHYROIDISM 04/07/2007  . DIABETES MELLITUS, TYPE II 04/07/2007  . HYPERLIPIDEMIA 04/07/2007  . GOUT 04/07/2007  . HYPERTENSION 04/07/2007  . DERMATITIS, CNTCT, ACUTE D/T SOLAR RADIATION 04/07/2007  . HEPATITIS B, HX OF 04/07/2007   Past Medical History  Diagnosis  Date  . CAD (coronary artery disease)     CABG 2005 by Dr Cyndia Bent  . Hyperlipidemia   . Hypertension   . Hypothyroidism   . Sun-damaged skin   . Benign prostatic hypertrophy   . Gout   . DJD (degenerative joint disease)     bilateral knee, gout  . Hepatitis A 1950  . Hepatitis B 1980's  . Type II diabetes mellitus   . Sleep apnea 1990's    "before uvulectomy"  . GERD (gastroesophageal reflux disease)   . Squamous cell carcinoma     "lots on my arms & face" (09/21/2013)  . Personal history of other infectious and parasitic disease   . Knee joint replacement by other means   . Glomerulonephritis acute     at age 56, due to beta strep, resolved, renal calculi- passed spontaneously  . HOH (hard of hearing)     wears hearing aids  . Incontinence of urine     Past Surgical History  Procedure Laterality Date  . Coronary artery bypass graft  01/23/04    Dr Cyndia Bent  . Inguinal hernia repair Bilateral 1980's - 1990's  . Umbilical hernia repair    . Mastoidectomy Right 1943  . Quadriceps repair Right 2009  . Uvulopalatoplasty  [id number]    ENT MD  . Total knee arthroplasty Left 09/18/2013    Procedure: TOTAL KNEE ARTHROPLASTY;  Surgeon: Hessie Dibble, MD;  Location: [facility];  Service: Orthopedics;  Laterality: Left;  . Cataract extraction w/ intraocular lens  implant, bilateral Bilateral   . Retinal detachment surgery Right X 2  . Cardiac catheterization    . Tonsillectomy  ~ 1943  . Eye surgery Right  detached retina ,blind in right eye    No prescriptions prior to admission   No Known Allergies  History  Substance Use Topics  . Smoking status: Former Smoker -- 1.00 packs/day for 10 years    Types: Cigarettes    Quit date: 09/09/1957  . Smokeless tobacco: Not on file  . Alcohol Use: 8.4 oz/week    14 Shots of liquor per week     Comment: 1 2 oz shots of vodka per night    Family History  Problem Relation Age of Onset  . Diabetes Other     1st degree relative  .  Hypertension      Family History     Review of Systems  Constitutional: Negative.   HENT: Negative.   Eyes: Negative.   Respiratory: Negative.   Cardiovascular: Negative.   Gastrointestinal: Negative.   Genitourinary: Negative.   Musculoskeletal: Positive for joint pain.  Skin: Negative.   Neurological: Negative.   Endo/Heme/Allergies: Negative.   Psychiatric/Behavioral: Negative.     Objective:  Physical Exam  Constitutional: He is oriented to person, place, and time. He appears well-nourished.  HENT:  Head: Normocephalic.  Eyes: Pupils are equal, round, and reactive to light.  Neck: Normal range of motion.  Cardiovascular: Normal rate.   Respiratory: Effort normal.  GI: Soft.  Musculoskeletal:  Right knee exam range of motion 5-1 10.   No sign of infection.  Calf soft negative Homans.  Pain severe medial joint line to palpation, 1+ crepitation trace fluid  Neurological: He is oriented to person, place, and time.  Skin: Skin is dry.  Psychiatric: He has a normal mood and affect.    Vital signs in last 24 hours: Temp:  [97.5 F (36.4 C)] 97.5 F (36.4 C) (01/06 0811) Pulse Rate:  [65] 65 (01/06 0811) Resp:  [20] 20 (01/06 0811) BP: (148)/(66) 148/66 mmHg (01/06 0811) SpO2:  [94 %] 94 % (01/06 0811) Weight:  [111.449 kg (245 lb 11.2 oz)] 111.449 kg (245 lb 11.2 oz) (01/06 0811)  Labs:   Estimated body mass index is 28.26 kg/(m^2) as calculated from the following:   Height as of 02/04/14: 6' 2.5" (1.892 m).   Weight as of 02/26/14: 101.152 kg (223 lb).   Imaging Review Plain radiographs demonstrate severe degenerative joint disease of the right knee(s). The overall alignment isneutral. The bone quality appears to be good for age and reported activity level.  Assessment/Plan:  End stage arthritis, right knee Primary osteoarthritis, right knee  The patient history, physical examination, clinical judgment of the provider and imaging studies are consistent with  end stage degenerative joint disease of the right knee(s) and total knee arthroplasty is deemed medically necessary. The treatment options including medical management, injection therapy arthroscopy and arthroplasty were discussed at length. The risks and benefits of total knee arthroplasty were presented and reviewed. The risks due to aseptic loosening, infection, stiffness, patella tracking problems, thromboembolic complications and other imponderables were discussed. The patient acknowledged the explanation, agreed to proceed with the plan and consent was signed. Patient is being admitted for inpatient treatment for surgery, pain control, PT, OT, prophylactic antibiotics, VTE prophylaxis, progressive ambulation and ADL's and discharge planning. The patient is planning to be discharged to skilled nursing facility

## 2014-08-15 LAB — URINE CULTURE: Colony Count: 5000

## 2014-08-21 MED ORDER — LACTATED RINGERS IV SOLN
INTRAVENOUS | Status: DC
  Administered 2014-08-22: 13:00:00 via INTRAVENOUS

## 2014-08-21 MED ORDER — CEFAZOLIN SODIUM-DEXTROSE 2-3 GM-% IV SOLR
2.0000 g | INTRAVENOUS | Status: AC
  Administered 2014-08-22: 2 g via INTRAVENOUS
  Filled 2014-08-21: qty 50

## 2014-08-22 ENCOUNTER — Encounter (HOSPITAL_COMMUNITY)
Admission: RE | Disposition: A | Discharge status: Skilled Nursing Facility | Payer: Self-pay | Source: Ambulatory Visit | Attending: Orthopaedic Surgery

## 2014-08-22 ENCOUNTER — Inpatient Hospital Stay (HOSPITAL_COMMUNITY)
Admission: RE | Admit: 2014-08-22 | Discharge: 2014-08-26 | DRG: 470 | Disposition: A | Discharge status: Skilled Nursing Facility | Payer: Medicare Other | Source: Ambulatory Visit | Attending: Orthopaedic Surgery | Admitting: Orthopaedic Surgery

## 2014-08-22 ENCOUNTER — Inpatient Hospital Stay (HOSPITAL_COMMUNITY): Payer: Medicare Other | Admitting: Anesthesiology

## 2014-08-22 ENCOUNTER — Encounter (HOSPITAL_COMMUNITY): Payer: Self-pay | Admitting: *Deleted

## 2014-08-22 DIAGNOSIS — I9581 Postprocedural hypotension: Secondary | ICD-10-CM | POA: Diagnosis not present

## 2014-08-22 DIAGNOSIS — K5909 Other constipation: Secondary | ICD-10-CM | POA: Diagnosis present

## 2014-08-22 DIAGNOSIS — E785 Hyperlipidemia, unspecified: Secondary | ICD-10-CM | POA: Diagnosis present

## 2014-08-22 DIAGNOSIS — N400 Enlarged prostate without lower urinary tract symptoms: Secondary | ICD-10-CM | POA: Diagnosis present

## 2014-08-22 DIAGNOSIS — M179 Osteoarthritis of knee, unspecified: Secondary | ICD-10-CM | POA: Diagnosis not present

## 2014-08-22 DIAGNOSIS — H9190 Unspecified hearing loss, unspecified ear: Secondary | ICD-10-CM | POA: Diagnosis present

## 2014-08-22 DIAGNOSIS — I959 Hypotension, unspecified: Secondary | ICD-10-CM | POA: Diagnosis not present

## 2014-08-22 DIAGNOSIS — Z96652 Presence of left artificial knee joint: Secondary | ICD-10-CM | POA: Diagnosis present

## 2014-08-22 DIAGNOSIS — Z794 Long term (current) use of insulin: Secondary | ICD-10-CM | POA: Diagnosis not present

## 2014-08-22 DIAGNOSIS — Z7982 Long term (current) use of aspirin: Secondary | ICD-10-CM | POA: Diagnosis not present

## 2014-08-22 DIAGNOSIS — E860 Dehydration: Secondary | ICD-10-CM | POA: Diagnosis not present

## 2014-08-22 DIAGNOSIS — S83104A Unspecified dislocation of right knee, initial encounter: Secondary | ICD-10-CM | POA: Diagnosis not present

## 2014-08-22 DIAGNOSIS — Z96651 Presence of right artificial knee joint: Secondary | ICD-10-CM | POA: Diagnosis not present

## 2014-08-22 DIAGNOSIS — N390 Urinary tract infection, site not specified: Secondary | ICD-10-CM | POA: Diagnosis not present

## 2014-08-22 DIAGNOSIS — M25561 Pain in right knee: Secondary | ICD-10-CM | POA: Diagnosis not present

## 2014-08-22 DIAGNOSIS — E11649 Type 2 diabetes mellitus with hypoglycemia without coma: Secondary | ICD-10-CM | POA: Diagnosis not present

## 2014-08-22 DIAGNOSIS — T40605A Adverse effect of unspecified narcotics, initial encounter: Secondary | ICD-10-CM | POA: Diagnosis present

## 2014-08-22 DIAGNOSIS — R2681 Unsteadiness on feet: Secondary | ICD-10-CM | POA: Diagnosis not present

## 2014-08-22 DIAGNOSIS — M1711 Unilateral primary osteoarthritis, right knee: Principal | ICD-10-CM | POA: Diagnosis present

## 2014-08-22 DIAGNOSIS — H5441 Blindness, right eye, normal vision left eye: Secondary | ICD-10-CM | POA: Diagnosis present

## 2014-08-22 DIAGNOSIS — K219 Gastro-esophageal reflux disease without esophagitis: Secondary | ICD-10-CM | POA: Diagnosis not present

## 2014-08-22 DIAGNOSIS — E039 Hypothyroidism, unspecified: Secondary | ICD-10-CM | POA: Diagnosis not present

## 2014-08-22 DIAGNOSIS — N179 Acute kidney failure, unspecified: Secondary | ICD-10-CM | POA: Diagnosis not present

## 2014-08-22 DIAGNOSIS — R509 Fever, unspecified: Secondary | ICD-10-CM

## 2014-08-22 DIAGNOSIS — I2510 Atherosclerotic heart disease of native coronary artery without angina pectoris: Secondary | ICD-10-CM | POA: Diagnosis present

## 2014-08-22 DIAGNOSIS — Z471 Aftercare following joint replacement surgery: Secondary | ICD-10-CM | POA: Diagnosis not present

## 2014-08-22 DIAGNOSIS — M6281 Muscle weakness (generalized): Secondary | ICD-10-CM | POA: Diagnosis not present

## 2014-08-22 DIAGNOSIS — I10 Essential (primary) hypertension: Secondary | ICD-10-CM | POA: Diagnosis present

## 2014-08-22 DIAGNOSIS — Z951 Presence of aortocoronary bypass graft: Secondary | ICD-10-CM

## 2014-08-22 DIAGNOSIS — E119 Type 2 diabetes mellitus without complications: Secondary | ICD-10-CM | POA: Diagnosis not present

## 2014-08-22 DIAGNOSIS — Z87891 Personal history of nicotine dependence: Secondary | ICD-10-CM

## 2014-08-22 DIAGNOSIS — R278 Other lack of coordination: Secondary | ICD-10-CM | POA: Diagnosis not present

## 2014-08-22 DIAGNOSIS — R339 Retention of urine, unspecified: Secondary | ICD-10-CM | POA: Diagnosis not present

## 2014-08-22 DIAGNOSIS — I517 Cardiomegaly: Secondary | ICD-10-CM | POA: Diagnosis not present

## 2014-08-22 DIAGNOSIS — N281 Cyst of kidney, acquired: Secondary | ICD-10-CM | POA: Diagnosis not present

## 2014-08-22 HISTORY — PX: TOTAL KNEE ARTHROPLASTY: SHX125

## 2014-08-22 LAB — HEPATIC FUNCTION PANEL
ALBUMIN: 4.3 g/dL (ref 3.5–5.2)
ALT: 20 U/L (ref 0–53)
AST: 31 U/L (ref 0–37)
Alkaline Phosphatase: 52 U/L (ref 39–117)
Bilirubin, Direct: 0.2 mg/dL (ref 0.0–0.3)
Indirect Bilirubin: 0.5 mg/dL (ref 0.3–0.9)
Total Bilirubin: 0.7 mg/dL (ref 0.3–1.2)
Total Protein: 6.8 g/dL (ref 6.0–8.3)

## 2014-08-22 LAB — GLUCOSE, CAPILLARY
Glucose-Capillary: 157 mg/dL — ABNORMAL HIGH (ref 70–99)
Glucose-Capillary: 105 mg/dL — ABNORMAL HIGH (ref 70–99)
Glucose-Capillary: 76 mg/dL (ref 70–99)

## 2014-08-22 SURGERY — ARTHROPLASTY, KNEE, TOTAL
Anesthesia: General | Site: Knee | Laterality: Right

## 2014-08-22 MED ORDER — FENTANYL CITRATE 0.05 MG/ML IJ SOLN
25.0000 ug | INTRAMUSCULAR | Status: DC | PRN
  Administered 2014-08-22 (×3): 50 ug via INTRAVENOUS

## 2014-08-22 MED ORDER — METFORMIN HCL 500 MG PO TABS
1000.0000 mg | ORAL_TABLET | Freq: Two times a day (BID) | ORAL | Status: DC
  Administered 2014-08-23 – 2014-08-24 (×4): 1000 mg via ORAL
  Filled 2014-08-22 (×7): qty 2

## 2014-08-22 MED ORDER — FENTANYL CITRATE 0.05 MG/ML IJ SOLN
INTRAMUSCULAR | Status: AC
  Administered 2014-08-22: 50 ug
  Filled 2014-08-22: qty 2

## 2014-08-22 MED ORDER — METHOCARBAMOL 1000 MG/10ML IJ SOLN
500.0000 mg | Freq: Four times a day (QID) | INTRAVENOUS | Status: DC | PRN
  Filled 2014-08-22: qty 5

## 2014-08-22 MED ORDER — SIMVASTATIN 40 MG PO TABS
40.0000 mg | ORAL_TABLET | Freq: Every day | ORAL | Status: DC
  Administered 2014-08-22 – 2014-08-25 (×4): 40 mg via ORAL
  Filled 2014-08-22 (×5): qty 1

## 2014-08-22 MED ORDER — MEPERIDINE HCL 25 MG/ML IJ SOLN: 6.2500 mg | INTRAMUSCULAR | Status: DC | PRN

## 2014-08-22 MED ORDER — GLIPIZIDE 10 MG PO TABS
10.0000 mg | ORAL_TABLET | Freq: Two times a day (BID) | ORAL | Status: DC
  Administered 2014-08-23 – 2014-08-24 (×4): 10 mg via ORAL
  Filled 2014-08-22 (×7): qty 1

## 2014-08-22 MED ORDER — INSULIN ASPART 100 UNIT/ML ~~LOC~~ SOLN
0.0000 [IU] | Freq: Three times a day (TID) | SUBCUTANEOUS | Status: DC
  Administered 2014-08-23 – 2014-08-24 (×4): 3 [IU] via SUBCUTANEOUS

## 2014-08-22 MED ORDER — BUPIVACAINE LIPOSOME 1.3 % IJ SUSP
20.0000 mL | Freq: Once | INTRAMUSCULAR | Status: DC
  Filled 2014-08-22: qty 20

## 2014-08-22 MED ORDER — HYDROMORPHONE HCL 1 MG/ML IJ SOLN
0.5000 mg | INTRAMUSCULAR | Status: DC | PRN
  Administered 2014-08-22 – 2014-08-24 (×7): 1 mg via INTRAVENOUS
  Filled 2014-08-22 (×8): qty 1

## 2014-08-22 MED ORDER — FENTANYL CITRATE 0.05 MG/ML IJ SOLN
INTRAMUSCULAR | Status: AC
  Administered 2014-08-22: 50 ug via INTRAVENOUS
  Filled 2014-08-22: qty 2

## 2014-08-22 MED ORDER — NITROGLYCERIN 0.4 MG SL SUBL: 0.4000 mg | SUBLINGUAL_TABLET | SUBLINGUAL | Status: DC | PRN

## 2014-08-22 MED ORDER — NEOSTIGMINE METHYLSULFATE 10 MG/10ML IV SOLN
INTRAVENOUS | Status: DC | PRN
  Administered 2014-08-22: 5 mg via INTRAVENOUS

## 2014-08-22 MED ORDER — MIDAZOLAM HCL 2 MG/2ML IJ SOLN
INTRAMUSCULAR | Status: AC
  Administered 2014-08-22: 1 mg
  Filled 2014-08-22: qty 2

## 2014-08-22 MED ORDER — ONDANSETRON HCL 4 MG PO TABS: 4.0000 mg | ORAL_TABLET | Freq: Four times a day (QID) | ORAL | Status: DC | PRN

## 2014-08-22 MED ORDER — ONDANSETRON HCL 4 MG/2ML IJ SOLN
INTRAMUSCULAR | Status: DC | PRN
  Administered 2014-08-22: 4 mg via INTRAVENOUS

## 2014-08-22 MED ORDER — ROCURONIUM BROMIDE 100 MG/10ML IV SOLN
INTRAVENOUS | Status: DC | PRN
  Administered 2014-08-22: 30 mg via INTRAVENOUS

## 2014-08-22 MED ORDER — FUROSEMIDE 20 MG PO TABS
20.0000 mg | ORAL_TABLET | Freq: Every day | ORAL | Status: DC | PRN
  Filled 2014-08-22: qty 1

## 2014-08-22 MED ORDER — ALLOPURINOL 300 MG PO TABS
300.0000 mg | ORAL_TABLET | Freq: Every day | ORAL | Status: DC
  Administered 2014-08-23 – 2014-08-26 (×3): 300 mg via ORAL
  Filled 2014-08-22 (×4): qty 1

## 2014-08-22 MED ORDER — PROMETHAZINE HCL 25 MG/ML IJ SOLN: 6.2500 mg | INTRAMUSCULAR | Status: DC | PRN

## 2014-08-22 MED ORDER — LIDOCAINE HCL (CARDIAC) 20 MG/ML IV SOLN
INTRAVENOUS | Status: DC | PRN
  Administered 2014-08-22: 80 mg via INTRAVENOUS

## 2014-08-22 MED ORDER — HYDROCHLOROTHIAZIDE 25 MG PO TABS
25.0000 mg | ORAL_TABLET | Freq: Every day | ORAL | Status: DC
  Administered 2014-08-23: 25 mg via ORAL
  Filled 2014-08-22 (×2): qty 1

## 2014-08-22 MED ORDER — PHENYLEPHRINE HCL 10 MG/ML IJ SOLN
INTRAMUSCULAR | Status: DC | PRN
  Administered 2014-08-22: 40 ug via INTRAVENOUS
  Administered 2014-08-22: 80 ug via INTRAVENOUS
  Administered 2014-08-22: 40 ug via INTRAVENOUS

## 2014-08-22 MED ORDER — NIACIN 100 MG PO TABS
100.0000 mg | ORAL_TABLET | Freq: Every day | ORAL | Status: DC
  Administered 2014-08-23 – 2014-08-26 (×4): 100 mg via ORAL
  Filled 2014-08-22 (×4): qty 1

## 2014-08-22 MED ORDER — BENAZEPRIL HCL 20 MG PO TABS
20.0000 mg | ORAL_TABLET | Freq: Every day | ORAL | Status: DC
  Administered 2014-08-23: 20 mg via ORAL
  Filled 2014-08-22 (×2): qty 1

## 2014-08-22 MED ORDER — MIDAZOLAM HCL 2 MG/2ML IJ SOLN
INTRAMUSCULAR | Status: AC
  Filled 2014-08-22: qty 2

## 2014-08-22 MED ORDER — SODIUM CHLORIDE 0.9 % IV SOLN
INTRAVENOUS | Status: DC | PRN
  Administered 2014-08-22: 40 mL

## 2014-08-22 MED ORDER — BENAZEPRIL-HYDROCHLOROTHIAZIDE 20-25 MG PO TABS: 1.0000 | ORAL_TABLET | Freq: Every day | ORAL | Status: DC

## 2014-08-22 MED ORDER — ASPIRIN EC 325 MG PO TBEC
325.0000 mg | DELAYED_RELEASE_TABLET | Freq: Two times a day (BID) | ORAL | Status: DC
  Administered 2014-08-23 – 2014-08-26 (×7): 325 mg via ORAL
  Filled 2014-08-22 (×9): qty 1

## 2014-08-22 MED ORDER — ONDANSETRON HCL 4 MG/2ML IJ SOLN: 4.0000 mg | Freq: Four times a day (QID) | INTRAMUSCULAR | Status: DC | PRN

## 2014-08-22 MED ORDER — LEVOTHYROXINE SODIUM 100 MCG PO TABS
100.0000 ug | ORAL_TABLET | Freq: Every day | ORAL | Status: DC
  Administered 2014-08-23 – 2014-08-26 (×4): 100 ug via ORAL
  Filled 2014-08-22 (×5): qty 1

## 2014-08-22 MED ORDER — ALUM & MAG HYDROXIDE-SIMETH 200-200-20 MG/5ML PO SUSP
30.0000 mL | ORAL | Status: DC | PRN
  Administered 2014-08-23 – 2014-08-25 (×4): 30 mL via ORAL
  Filled 2014-08-22 (×4): qty 30

## 2014-08-22 MED ORDER — DOCUSATE SODIUM 100 MG PO CAPS
100.0000 mg | ORAL_CAPSULE | Freq: Two times a day (BID) | ORAL | Status: DC
Start: 2014-08-22 — End: 2014-08-24
  Administered 2014-08-22 – 2014-08-23 (×3): 100 mg via ORAL
  Filled 2014-08-22 (×5): qty 1

## 2014-08-22 MED ORDER — TRANEXAMIC ACID 100 MG/ML IV SOLN
2000.0000 mg | Freq: Once | INTRAVENOUS | Status: AC
  Administered 2014-08-22: 2000 mg via TOPICAL
  Filled 2014-08-22: qty 20

## 2014-08-22 MED ORDER — METHOCARBAMOL 500 MG PO TABS
500.0000 mg | ORAL_TABLET | Freq: Four times a day (QID) | ORAL | Status: DC | PRN
  Administered 2014-08-23 – 2014-08-24 (×2): 500 mg via ORAL
  Filled 2014-08-22 (×2): qty 1

## 2014-08-22 MED ORDER — PROPOFOL 10 MG/ML IV BOLUS
INTRAVENOUS | Status: DC | PRN
  Administered 2014-08-22: 150 mg via INTRAVENOUS

## 2014-08-22 MED ORDER — ACETAMINOPHEN 325 MG PO TABS
650.0000 mg | ORAL_TABLET | Freq: Four times a day (QID) | ORAL | Status: DC | PRN
  Administered 2014-08-24: 650 mg via ORAL
  Filled 2014-08-22: qty 2

## 2014-08-22 MED ORDER — METOPROLOL SUCCINATE 12.5 MG HALF TABLET
12.5000 mg | ORAL_TABLET | Freq: Every day | ORAL | Status: DC
  Administered 2014-08-23: 12.5 mg via ORAL
  Filled 2014-08-22 (×2): qty 1

## 2014-08-22 MED ORDER — SODIUM CHLORIDE 0.9 % IR SOLN
Status: DC | PRN
  Administered 2014-08-22: 1000 mL

## 2014-08-22 MED ORDER — MIDAZOLAM HCL 5 MG/5ML IJ SOLN
INTRAMUSCULAR | Status: DC | PRN
  Administered 2014-08-22: 1 mg via INTRAVENOUS

## 2014-08-22 MED ORDER — METOCLOPRAMIDE HCL 5 MG/ML IJ SOLN: 5.0000 mg | Freq: Three times a day (TID) | INTRAMUSCULAR | Status: DC | PRN

## 2014-08-22 MED ORDER — ACETAMINOPHEN 650 MG RE SUPP: 650.0000 mg | Freq: Four times a day (QID) | RECTAL | Status: DC | PRN

## 2014-08-22 MED ORDER — LACTATED RINGERS IV SOLN
INTRAVENOUS | Status: DC | PRN
  Administered 2014-08-22 (×2): via INTRAVENOUS

## 2014-08-22 MED ORDER — SODIUM CHLORIDE 0.9 % IR SOLN
Status: DC | PRN
  Administered 2014-08-22: 3000 mL

## 2014-08-22 MED ORDER — FENTANYL CITRATE 0.05 MG/ML IJ SOLN
INTRAMUSCULAR | Status: AC
  Filled 2014-08-22: qty 2

## 2014-08-22 MED ORDER — DIPHENHYDRAMINE HCL 12.5 MG/5ML PO ELIX
12.5000 mg | ORAL_SOLUTION | ORAL | Status: DC | PRN
  Administered 2014-08-23: 25 mg via ORAL
  Filled 2014-08-22: qty 10

## 2014-08-22 MED ORDER — MENTHOL 3 MG MT LOZG: 1.0000 | LOZENGE | OROMUCOSAL | Status: DC | PRN

## 2014-08-22 MED ORDER — METOCLOPRAMIDE HCL 10 MG PO TABS: 5.0000 mg | ORAL_TABLET | Freq: Three times a day (TID) | ORAL | Status: DC | PRN

## 2014-08-22 MED ORDER — INSULIN GLARGINE 100 UNIT/ML ~~LOC~~ SOLN
30.0000 [IU] | Freq: Two times a day (BID) | SUBCUTANEOUS | Status: DC
  Administered 2014-08-23 – 2014-08-24 (×3): 30 [IU] via SUBCUTANEOUS
  Filled 2014-08-22 (×6): qty 0.3

## 2014-08-22 MED ORDER — GLYCOPYRROLATE 0.2 MG/ML IJ SOLN
INTRAMUSCULAR | Status: DC | PRN
  Administered 2014-08-22: .8 mg via INTRAVENOUS

## 2014-08-22 MED ORDER — PIOGLITAZONE HCL 45 MG PO TABS
45.0000 mg | ORAL_TABLET | Freq: Every day | ORAL | Status: DC
  Administered 2014-08-23 – 2014-08-24 (×2): 45 mg via ORAL
  Filled 2014-08-22 (×4): qty 1

## 2014-08-22 MED ORDER — PHENOL 1.4 % MT LIQD: 1.0000 | OROMUCOSAL | Status: DC | PRN

## 2014-08-22 MED ORDER — FENTANYL CITRATE 0.05 MG/ML IJ SOLN
INTRAMUSCULAR | Status: DC | PRN
  Administered 2014-08-22 (×5): 50 ug via INTRAVENOUS

## 2014-08-22 MED ORDER — LACTATED RINGERS IV SOLN
INTRAVENOUS | Status: DC
  Administered 2014-08-22: 22:00:00 via INTRAVENOUS

## 2014-08-22 MED ORDER — OXYCODONE HCL 5 MG PO TABS
5.0000 mg | ORAL_TABLET | ORAL | Status: DC | PRN
  Administered 2014-08-22 – 2014-08-23 (×3): 10 mg via ORAL
  Administered 2014-08-23: 5 mg via ORAL
  Administered 2014-08-24: 10 mg via ORAL
  Filled 2014-08-22 (×4): qty 2
  Filled 2014-08-22: qty 1

## 2014-08-22 MED ORDER — TERAZOSIN HCL 1 MG PO CAPS
1.0000 mg | ORAL_CAPSULE | Freq: Every evening | ORAL | Status: DC | PRN
  Filled 2014-08-22: qty 1

## 2014-08-22 MED ORDER — PANTOPRAZOLE SODIUM 40 MG PO TBEC
40.0000 mg | DELAYED_RELEASE_TABLET | Freq: Every day | ORAL | Status: DC
  Administered 2014-08-23 – 2014-08-26 (×4): 40 mg via ORAL
  Filled 2014-08-22 (×5): qty 1

## 2014-08-22 MED ORDER — OXYCODONE HCL 5 MG PO TABS
ORAL_TABLET | ORAL | Status: AC
  Filled 2014-08-22: qty 2

## 2014-08-22 MED ORDER — CEFAZOLIN SODIUM-DEXTROSE 2-3 GM-% IV SOLR
2.0000 g | Freq: Four times a day (QID) | INTRAVENOUS | Status: AC
  Administered 2014-08-22 – 2014-08-23 (×2): 2 g via INTRAVENOUS
  Filled 2014-08-22 (×2): qty 50

## 2014-08-22 MED ORDER — FENTANYL CITRATE 0.05 MG/ML IJ SOLN
INTRAMUSCULAR | Status: AC
  Filled 2014-08-22: qty 5

## 2014-08-22 MED ORDER — MIRABEGRON ER 50 MG PO TB24
50.0000 mg | ORAL_TABLET | Freq: Every day | ORAL | Status: DC
  Administered 2014-08-23 – 2014-08-26 (×4): 50 mg via ORAL
  Filled 2014-08-22 (×6): qty 1

## 2014-08-22 MED ORDER — BISACODYL 5 MG PO TBEC
5.0000 mg | DELAYED_RELEASE_TABLET | Freq: Every day | ORAL | Status: DC | PRN
  Filled 2014-08-22: qty 1

## 2014-08-22 SURGICAL SUPPLY — 64 items
BAG DECANTER FOR FLEXI CONT (MISCELLANEOUS) ×2 IMPLANT
BANDAGE ELASTIC 4 VELCRO ST LF (GAUZE/BANDAGES/DRESSINGS) IMPLANT
BANDAGE ESMARK 6X9 LF (GAUZE/BANDAGES/DRESSINGS) ×1 IMPLANT
BENZOIN TINCTURE PRP APPL 2/3 (GAUZE/BANDAGES/DRESSINGS) ×2 IMPLANT
BLADE SAGITTAL 25.0X1.19X90 (BLADE) IMPLANT
BLADE SAW SGTL 13.0X1.19X90.0M (BLADE) IMPLANT
BLADE SURG ROTATE 9660 (MISCELLANEOUS) IMPLANT
BNDG ELASTIC 6X10 VLCR STRL LF (GAUZE/BANDAGES/DRESSINGS) ×2 IMPLANT
BNDG ESMARK 6X9 LF (GAUZE/BANDAGES/DRESSINGS) ×2
BNDG GAUZE ELAST 4 BULKY (GAUZE/BANDAGES/DRESSINGS) ×2 IMPLANT
BOWL SMART MIX CTS (DISPOSABLE) ×2 IMPLANT
CAP KNEE TOTAL 3 SIGMA ×2 IMPLANT
CEMENT HV SMART SET (Cement) ×4 IMPLANT
COVER SURGICAL LIGHT HANDLE (MISCELLANEOUS) ×2 IMPLANT
CUFF TOURNIQUET SINGLE 34IN LL (TOURNIQUET CUFF) ×2 IMPLANT
CUFF TOURNIQUET SINGLE 44IN (TOURNIQUET CUFF) IMPLANT
DRAPE EXTREMITY T 121X128X90 (DRAPE) ×2 IMPLANT
DRAPE IMP U-DRAPE 54X76 (DRAPES) ×2 IMPLANT
DRAPE PROXIMA HALF (DRAPES) ×2 IMPLANT
DRAPE U-SHAPE 47X51 STRL (DRAPES) ×2 IMPLANT
DRSG ADAPTIC 3X8 NADH LF (GAUZE/BANDAGES/DRESSINGS) IMPLANT
DRSG PAD ABDOMINAL 8X10 ST (GAUZE/BANDAGES/DRESSINGS) ×4 IMPLANT
DURAPREP 26ML APPLICATOR (WOUND CARE) ×2 IMPLANT
ELECT REM PT RETURN 9FT ADLT (ELECTROSURGICAL) ×2
ELECTRODE REM PT RTRN 9FT ADLT (ELECTROSURGICAL) ×1 IMPLANT
GAUZE SPONGE 4X4 12PLY STRL (GAUZE/BANDAGES/DRESSINGS) ×2 IMPLANT
GLOVE BIO SURGEON STRL SZ8 (GLOVE) ×4 IMPLANT
GLOVE BIOGEL PI IND STRL 8 (GLOVE) ×2 IMPLANT
GLOVE BIOGEL PI INDICATOR 8 (GLOVE) ×2
GOWN STRL REUS W/ TWL LRG LVL3 (GOWN DISPOSABLE) ×1 IMPLANT
GOWN STRL REUS W/ TWL XL LVL3 (GOWN DISPOSABLE) ×2 IMPLANT
GOWN STRL REUS W/TWL LRG LVL3 (GOWN DISPOSABLE) ×1
GOWN STRL REUS W/TWL XL LVL3 (GOWN DISPOSABLE) ×2
HANDPIECE INTERPULSE COAX TIP (DISPOSABLE) ×1
HOOD PEEL AWAY FACE SHEILD DIS (HOOD) ×4 IMPLANT
IMMOBILIZER KNEE 20 (SOFTGOODS) IMPLANT
IMMOBILIZER KNEE 22 UNIV (SOFTGOODS) ×2 IMPLANT
IMMOBILIZER KNEE 24 THIGH 36 (MISCELLANEOUS) IMPLANT
IMMOBILIZER KNEE 24 UNIV (MISCELLANEOUS)
KIT BASIN OR (CUSTOM PROCEDURE TRAY) ×2 IMPLANT
KIT ROOM TURNOVER OR (KITS) ×2 IMPLANT
MANIFOLD NEPTUNE II (INSTRUMENTS) ×2 IMPLANT
NEEDLE 22X1 1/2 (OR ONLY) (NEEDLE) ×2 IMPLANT
NEEDLE HYPO 21X1 ECLIPSE (NEEDLE) IMPLANT
NS IRRIG 1000ML POUR BTL (IV SOLUTION) ×2 IMPLANT
PACK TOTAL JOINT (CUSTOM PROCEDURE TRAY) ×2 IMPLANT
PACK UNIVERSAL I (CUSTOM PROCEDURE TRAY) ×2 IMPLANT
PAD ARMBOARD 7.5X6 YLW CONV (MISCELLANEOUS) ×4 IMPLANT
SET HNDPC FAN SPRY TIP SCT (DISPOSABLE) ×1 IMPLANT
STAPLER VISISTAT 35W (STAPLE) IMPLANT
STRIP CLOSURE SKIN 1/2X4 (GAUZE/BANDAGES/DRESSINGS) ×2 IMPLANT
SUCTION FRAZIER TIP 10 FR DISP (SUCTIONS) IMPLANT
SUT MNCRL AB 3-0 PS2 18 (SUTURE) IMPLANT
SUT VIC AB 0 CT1 27 (SUTURE) ×1
SUT VIC AB 0 CT1 27XBRD ANBCTR (SUTURE) ×1 IMPLANT
SUT VIC AB 2-0 CT1 27 (SUTURE) ×1
SUT VIC AB 2-0 CT1 TAPERPNT 27 (SUTURE) ×1 IMPLANT
SUT VLOC 180 0 24IN GS25 (SUTURE) ×2 IMPLANT
SYR 50ML LL SCALE MARK (SYRINGE) ×2 IMPLANT
TOWEL OR 17X24 6PK STRL BLUE (TOWEL DISPOSABLE) ×2 IMPLANT
TOWEL OR 17X26 10 PK STRL BLUE (TOWEL DISPOSABLE) ×2 IMPLANT
TRAY FOLEY CATH 14FR (SET/KITS/TRAYS/PACK) IMPLANT
UPCHARGE REV TRAY MBT KNEE ×2 IMPLANT
WATER STERILE IRR 1000ML POUR (IV SOLUTION) IMPLANT

## 2014-08-22 NOTE — Transfer of Care (Signed)
Immediate Anesthesia Transfer of Care Note  Patient: Jonathan [name]  Procedure(s) Performed: Procedure(s): RIGHT TOTAL KNEE ARTHROPLASTY (Right)  Patient Location: PACU  Anesthesia Type:General  Level of Consciousness: awake, alert  and oriented  Airway & Oxygen Therapy: Patient Spontanous Breathing  Post-op Assessment: Report given to PACU RN  Post vital signs: Reviewed and stable  Complications: No apparent anesthesia complications

## 2014-08-22 NOTE — Op Note (Signed)
PREOP DIAGNOSIS: DJD RIGHT KNEE POSTOP DIAGNOSIS: same PROCEDURE: RIGHT TKR ANESTHESIA: General and block ATTENDING SURGEON: Jaire Pinkham G ASSISTANT: Loni Dolly PA  INDICATIONS FOR PROCEDURE: Jonathan [name] is a 79 y.o. male who has struggled for a long time with pain due to degenerative arthritis of the right knee.  The patient has failed many conservative non-operative measures and at this point has pain which limits the ability to sleep and walk.  The patient is offered total knee replacement.  Informed operative consent was obtained after discussion of possible risks of anesthesia, infection, neurovascular injury, DVT, and death.  The importance of the post-operative rehabilitation protocol to optimize result was stressed extensively with the patient.  SUMMARY OF FINDINGS AND PROCEDURE:  Jonathan [name] was taken to the operative suite where under the above anesthesia a right knee replacement was performed.  There were advanced degenerative changes and the bone quality was excellent.  We used the DePuy LCS system and placed size large femur, 5 MBT tibia, 41 mm all polyethylene patella, and a size 10 mm spacer.  Loni Dolly PA-C assisted throughout and was invaluable to the completion of the case in that he helped retract and maintain exposure while I placed components.  He also helped close thereby minimizing OR time.  The patient was admitted for appropriate post-op care to include perioperative antibiotics and mechanical and pharmacologic measures for DVT prophylaxis.  DESCRIPTION OF PROCEDURE:  Jonathan [name] was taken to the operative suite where the above anesthesia was applied.  The patient was positioned supine and prepped and draped in normal sterile fashion.  An appropriate time out was performed.  After the administration of kefzol pre-op antibiotic the leg was elevated and exsanguinated and a tourniquet inflated. A standard longitudinal incision was made on the anterior knee.   Dissection was carried down to the extensor mechanism.  All appropriate anti-infective measures were used including the pre-operative antibiotic, betadine impregnated drape, and closed hooded exhaust systems for each member of the surgical team.  A medial parapatellar incision was made in the extensor mechanism and the knee cap flipped and the knee flexed.  Some residual meniscal tissues were removed along with any remaining ACL/PCL tissue.  A guide was placed on the tibia and a flat cut was made on it's superior surface.  An intramedullary guide was placed in the femur and was utilized to make anterior and posterior cuts creating an appropriate flexion gap.  A second intramedullary guide was placed in the femur to make a distal cut properly balancing the knee with an extension gap equal to the flexion gap.  The three bones sized to the above mentioned sizes and the appropriate guides were placed and utilized.  A trial reduction was done and the knee easily came to full extension and the patella tracked well on flexion.  The trial components were removed and all bones were cleaned with pulsatile lavage and then dried thoroughly.  Cement was mixed and was pressurized onto the bones followed by placement of the aforementioned components.  Excess cement was trimmed and pressure was held on the components until the cement had hardened.  The tourniquet was deflated and a small amount of bleeding was controlled with cautery and pressure.  The knee was irrigated thoroughly.  The extensor mechanism was re-approximated with V-loc suture in running fashion.  The knee was flexed and the repair was solid.  The subcutaneous tissues were re-approximated with #0 and #2-0 vicryl and the skin closed with  a subcuticular stitch and steristrips.  A sterile dressing was applied.  Intraoperative fluids, EBL, and tourniquet time can be obtained from anesthesia records.  DISPOSITION:  The patient was taken to recovery room in stable  condition and admitted for appropriate post-op care to include peri-operative antibiotic and DVT prophylaxis with mechanical and pharmacologic measures.  Madinah Quarry G 08/22/2014, 4:59 PM

## 2014-08-22 NOTE — Anesthesia Postprocedure Evaluation (Signed)
Anesthesia Post Note  Patient: Jonathan [name]  Procedure(s) Performed: Procedure(s) (LRB): RIGHT TOTAL KNEE ARTHROPLASTY (Right)  Anesthesia type: general  Patient location: PACU  Post pain: Pain level controlled  Post assessment: Patient's Cardiovascular Status Stable  Last Vitals:  Filed Vitals:   08/22/14 1847  BP: 131/72  Pulse: 54  Temp:   Resp: 14    Post vital signs: Reviewed and stable  Level of consciousness: sedated  Complications: No apparent anesthesia complications

## 2014-08-22 NOTE — Interval H&P Note (Signed)
OK for surgery PD 

## 2014-08-22 NOTE — Anesthesia Preprocedure Evaluation (Signed)
Anesthesia Evaluation  Patient identified by MRN, date of birth, ID band Patient awake    Reviewed: Allergy & Precautions, NPO status , Patient's Chart, lab work & pertinent test results, reviewed documented beta blocker date and time   Airway        Dental   Pulmonary sleep apnea , former smoker (quit 1959 10 pack year total),          Cardiovascular hypertension, + CAD (SP ACB 2005)  ECHO 2015 EF 60%, normal stress 2013   Neuro/Psych    GI/Hepatic GERD-  Medicated,  Endo/Other  diabetes, Poorly Controlled, Type 2, Insulin DependentHypothyroidism (replacement)   Renal/GU GFR 80     Musculoskeletal   Abdominal   Peds  Hematology H/H 14/42   Anesthesia Other Findings   Reproductive/Obstetrics                             Anesthesia Physical Anesthesia Plan  ASA: III  Anesthesia Plan: General   Post-op Pain Management: MAC Combined w/ Regional for Post-op pain   Induction: Intravenous  Airway Management Planned: Oral ETT  Additional Equipment:   Intra-op Plan:   Post-operative Plan: Extubation in OR  Informed Consent: I have reviewed the patients History and Physical, chart, labs and discussed the procedure including the risks, benefits and alternatives for the proposed anesthesia with the patient or authorized representative who has indicated his/her understanding and acceptance.     Plan Discussed with:   Anesthesia Plan Comments:         Anesthesia Quick Evaluation

## 2014-08-22 NOTE — Progress Notes (Signed)
Orthopedic Tech Progress Note Patient Details:  Jonathan [name] 1936/05/11 [id number]  CPM Right Knee CPM Right Knee: On Right Knee Flexion (Degrees): 90 Right Knee Extension (Degrees): 0 Additional Comments: applied ohf to bed   Braulio Bosch 08/22/2014, 6:35 PM

## 2014-08-22 NOTE — Anesthesia Procedure Notes (Signed)
Procedure Name: Intubation Date/Time: 08/22/2014 3:32 PM Performed by: Clearnce Sorrel Pre-anesthesia Checklist: Patient identified, Timeout performed, Emergency Drugs available, Suction available and Patient being monitored Patient Re-evaluated:Patient Re-evaluated prior to inductionOxygen Delivery Method: Circle system utilized Preoxygenation: Pre-oxygenation with 100% oxygen Intubation Type: IV induction Ventilation: Mask ventilation without difficulty Laryngoscope Size: Mac and 3 Grade View: Grade II Tube type: Oral Tube size: 7.5 mm Number of attempts: 1

## 2014-08-23 LAB — CBC
HEMATOCRIT: 36.1 % — AB (ref 39.0–52.0)
Hemoglobin: 12.4 g/dL — ABNORMAL LOW (ref 13.0–17.0)
MCH: 32.1 pg (ref 26.0–34.0)
MCHC: 34.3 g/dL (ref 30.0–36.0)
MCV: 93.5 fL (ref 78.0–100.0)
Platelets: 156 10*3/uL (ref 150–400)
RBC: 3.86 MIL/uL — ABNORMAL LOW (ref 4.22–5.81)
RDW: 14.3 % (ref 11.5–15.5)
WBC: 5.9 10*3/uL (ref 4.0–10.5)

## 2014-08-23 LAB — GLUCOSE, CAPILLARY
GLUCOSE-CAPILLARY: 175 mg/dL — AB (ref 70–99)
GLUCOSE-CAPILLARY: 191 mg/dL — AB (ref 70–99)
GLUCOSE-CAPILLARY: 201 mg/dL — AB (ref 70–99)
Glucose-Capillary: 194 mg/dL — ABNORMAL HIGH (ref 70–99)

## 2014-08-23 LAB — BASIC METABOLIC PANEL
ANION GAP: 9 (ref 5–15)
BUN: 12 mg/dL (ref 6–23)
CO2: 26 mmol/L (ref 19–32)
Calcium: 8.4 mg/dL (ref 8.4–10.5)
Chloride: 102 mEq/L (ref 96–112)
Creatinine, Ser: 1.04 mg/dL (ref 0.50–1.35)
GFR calc Af Amer: 78 mL/min — ABNORMAL LOW (ref >90)
GFR calc non Af Amer: 68 mL/min — ABNORMAL LOW (ref >90)
Glucose, Bld: 185 mg/dL — ABNORMAL HIGH (ref 70–99)
Potassium: 4 mmol/L (ref 3.5–5.1)
Sodium: 137 mmol/L (ref 135–145)

## 2014-08-23 MED ORDER — ASPIRIN 325 MG PO TBEC: 325.0000 mg | DELAYED_RELEASE_TABLET | Freq: Two times a day (BID) | ORAL | Status: DC

## 2014-08-23 MED ORDER — METHOCARBAMOL 500 MG PO TABS: 500.0000 mg | ORAL_TABLET | Freq: Four times a day (QID) | ORAL | Status: DC | PRN

## 2014-08-23 MED ORDER — TAMSULOSIN HCL 0.4 MG PO CAPS
0.4000 mg | ORAL_CAPSULE | Freq: Every day | ORAL | Status: DC
  Administered 2014-08-23 – 2014-08-26 (×4): 0.4 mg via ORAL
  Filled 2014-08-23 (×4): qty 1

## 2014-08-23 MED ORDER — TAMSULOSIN HCL 0.4 MG PO CAPS: 0.4000 mg | ORAL_CAPSULE | Freq: Every day | ORAL | Status: DC

## 2014-08-23 MED ORDER — OXYCODONE HCL 5 MG PO TABS: 5.0000 mg | ORAL_TABLET | ORAL | Status: DC | PRN

## 2014-08-23 NOTE — Progress Notes (Signed)
CARE MANAGEMENT NOTE 08/23/2014  Patient:  Jonathan [name], Jonathan [name]   Account Number:  [id number]  Date Initiated:  08/23/2014  Documentation initiated by:  [facility]  Subjective/Objective Assessment:   s/p rt TKA     Action/Plan:   PT eval-recommended SNF   Anticipated DC Date:  08/24/2014   Anticipated DC Plan:  SKILLED NURSING FACILITY  In-house referral  Clinical Social Worker      DC Planning Services  CM consult      Choice offered to / List presented to:             Status of service:  Completed, signed off Medicare Important Message given?   (If response is "NO", the following Medicare IM given date fields will be blank) Date Medicare IM given:   Medicare IM given by:   Date Additional Medicare IM given:   Additional Medicare IM given by:    Discharge Disposition:  [place]  Per UR Regulation:  Reviewed for med. necessity/level of care/duration of stay  If discussed at [place] of Stay Meetings, dates discussed:    Comments:  08/23/14 PT recommended SNF. Referral made to CSW. Anticipatinf discharge to [facility] on 08/24/14.

## 2014-08-23 NOTE — Clinical Social Work Placement (Addendum)
Clinical Social Work Department CLINICAL SOCIAL WORK PLACEMENT NOTE 08/23/2014  Patient:  CRISTINO, DEGROFF  Account Number:  [id number] Admit date:  08/22/2014  Clinical Social Worker:  Wylene Men  Date/time:  08/23/2014 02:54 PM  Clinical Social Work is seeking post-discharge placement for this patient at the following level of care:   SKILLED NURSING   (*CSW will update this form in Epic as items are completed)   08/23/2014  Patient/family provided with [place] Department of Clinical Social Work's list of facilities offering this level of care within the geographic area requested by the patient (or if unable, by the patient's family).  08/23/2014  Patient/family informed of their freedom to choose among providers that offer the needed level of care, that participate in Medicare, Medicaid or managed care program needed by the patient, have an available bed and are willing to accept the patient.  08/23/2014  Patient/family informed of [facility]' ownership interest in [facility], as well as of the fact that they are under no obligation to receive care at this facility.  PASARR submitted to EDS on  PASARR number received on   FL2 transmitted to all facilities in geographic area requested by pt/family on  08/23/2014 FL2 transmitted to all facilities within larger geographic area on   Patient informed that his/her managed care company has contracts with or will negotiate with  certain facilities, including the following:     Patient/family informed of bed offers received:  08/23/2014 [facility] chooses bed at [facility] recommends and patient chooses bed at    Patient to be transferred to [facility] on  08/26/2014 Patient to be transferred to facility by [facility] Patient and family notified of transfer on 08/26/2014 Name of family member notified:  Patient is alert and oriented with family at bedside.  All were updated and agreeable to dc plans for  today.  Transportation scheduled for 1:30pm per patient and Therapist, sports.  The following physician request were entered in Epic:   Additional Comments:  Nonnie Done, [place] [phone]  Psychiatric & Orthopedics (5N 1-16) Clinical Social Worker

## 2014-08-23 NOTE — Progress Notes (Signed)
Orthopedic Tech Progress Note Patient Details:  Jonathan [name] [birth year] [id number]  Patient ID: Jonathan [name], male   DOB: 08/23/1935, 79 y.o.   MRN: [id number] Placed pt's rle in cpm @ 0-90 degrees @1400   Hildred Priest 08/23/2014, 2:27 PM

## 2014-08-23 NOTE — Progress Notes (Signed)
Physical Therapy Treatment Patient Details Name: Jonathan [name] MRN: [id number] DOB: Aug 19, 1935 Today's Date: 08/23/2014    History of Present Illness pt is a 79 y.o. male s/p Rt TKA     PT Comments    PT. Was compliant to therapy and stated that he felt good after it. Pt. A good candidate for [place] because he is motivated to work toward getting better.   Follow Up Recommendations  SNF     Equipment Recommendations  None recommended by PT    Recommendations for Other Services       Precautions / Restrictions Precautions Precautions: Knee Precaution Comments: educated that he doesn't need immobilizer on when he is in the bed. Required Braces or Orthoses: Knee Immobilizer - Right Restrictions Weight Bearing Restrictions: Yes RLE Weight Bearing: Weight bearing as tolerated    Mobility  Bed Mobility Overal bed mobility: Needs Assistance Bed Mobility: Sit to Supine       Sit to supine: Supervision   General bed mobility comments: pt relying on handrails and incr time to return to supine   Transfers Overall transfer level: Needs assistance Equipment used: Rolling walker (2 wheeled) Transfers: Sit to/from Stand Sit to Stand: Min guard         General transfer comment: cues for hand placement and safety with RW  Ambulation/Gait Ambulation/Gait assistance: Min guard Ambulation Distance (Feet): 100 Feet Assistive device: Rolling walker (2 wheeled) Gait Pattern/deviations: Step-through pattern;Trunk flexed;Decreased stance time - right;Decreased step length - left Gait velocity: decreased Gait velocity interpretation: Below normal speed for age/gender General Gait Details: cues for upright posture    Stairs            Wheelchair Mobility    Modified Rankin (Stroke Patients Only)       Balance                                    Cognition Arousal/Alertness: Awake/alert Behavior During Therapy: WFL for tasks  assessed/performed Overall Cognitive Status: Within Functional Limits for tasks assessed                      Exercises General Exercises - Lower Extremity Ankle Circles/Pumps: AROM;Both;10 reps;Supine Quad Sets: AROM;Right;Supine;10 reps Heel Slides: AROM;Right;Supine;5 reps Straight Leg Raises: Supine;AROM;Right;10 reps    General Comments        Pertinent Vitals/Pain Pain Score: 4  Pain Location: R knee Pain Descriptors / Indicators: Sore Pain Intervention(s): Monitored during session;Premedicated before session;Repositioned    Home Living                      Prior Function            PT Goals (current goals can now be found in the care plan section) Progress towards PT goals: Progressing toward goals    Frequency  7X/week    PT Plan Current plan remains appropriate    Co-evaluation             End of Session Equipment Utilized During Treatment: Gait belt;Right knee immobilizer Activity Tolerance: Patient tolerated treatment well Patient left: in bed;with call bell/phone within reach;with bed alarm set     Time: 1336-1410 PT Time Calculation (min) (ACUTE ONLY): 34 min  Charges:  $Gait Training: 8-22 mins $Therapeutic Exercise: 8-22 mins  G Codes:      Jodi Geralds, [place] 08/23/2014, 3:15 PM

## 2014-08-23 NOTE — Progress Notes (Signed)
Utilization review completed.  

## 2014-08-23 NOTE — Progress Notes (Signed)
Subjective: 1 Day Post-Op Procedure(s) (LRB): RIGHT TOTAL KNEE ARTHROPLASTY (Right)  Activity level:  wbat Diet tolerance:  Eating well Voiding:  ok Patient reports pain as mild.    Objective: Vital signs in last 24 hours: Temp:  [97.5 F (36.4 C)-97.7 F (36.5 C)] 97.7 F (36.5 C) (01/15 0511) Pulse Rate:  [51-76] 76 (01/15 0511) Resp:  [8-23] 15 (01/15 0511) BP: (107-158)/(44-96) 117/65 mmHg (01/15 0511) SpO2:  [94 %-100 %] 94 % (01/15 0511) Weight:  [111.131 kg (245 lb)] 111.131 kg (245 lb) (01/14 1318)  Labs:  Recent Labs  08/23/14 0539  HGB 12.4*    Recent Labs  08/23/14 0539  WBC 5.9  RBC 3.86*  HCT 36.1*  PLT 156    Recent Labs  08/23/14 0539  NA 137  K 4.0  CL 102  CO2 26  BUN 12  CREATININE 1.04  GLUCOSE 185*  CALCIUM 8.4   No results for input(s): LABPT, INR in the last 72 hours.  Physical Exam:  Neurologically intact ABD soft Neurovascular intact Sensation intact distally Intact pulses distally Dorsiflexion/Plantar flexion intact Incision: dressing C/D/I and scant drainage No cellulitis present Compartment soft  Assessment/Plan:  1 Day Post-Op Procedure(s) (LRB): RIGHT TOTAL KNEE ARTHROPLASTY (Right) Advance diet Up with therapy D/C IV fluids Plan for discharge tomorrow Discharge to SNF [facility])if continuing to do well and cleared by PT Continue on ASA 325mg  BID x 2 weeks post op for DVT prevention. Follow up in office 2 weeks post op.  Dressing change to Aquacel prior to discharge.   Jonathan [name], Jonathan [name] 08/23/2014, 8:02 AM

## 2014-08-23 NOTE — Clinical Social Work Psychosocial (Signed)
Clinical Social Work Department BRIEF PSYCHOSOCIAL ASSESSMENT 08/23/2014  Patient:  Jonathan [name], Jonathan [name]     Account Number:  [id number]     Admit date:  08/22/2014  Clinical Social Worker:  Wylene Men  Date/Time:  08/23/2014 01:23 PM  Referred by:  Physician  Date Referred:  08/23/2014 Referred for  SNF Placement  Psychosocial assessment   Other Referral:   none   Interview type:  Patient Other interview type:   none    PSYCHOSOCIAL DATA Living Status:  WIFE Admitted from facility:   Level of care:   Primary support name:  margaret Primary support relationship to patient:  SPOUSE Degree of support available:   strong    CURRENT CONCERNS Current Concerns  Post-Acute Placement   Other Concerns:   none    SOCIAL WORK ASSESSMENT / PLAN CSW assessed patient at bedside.  Patient is alert and oriented.  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  Patient is a part of the bundle and is projected to discharge to [facility] on Saturday (tomorrow 08/24/2014).  RNCM Renee (from MD office) met with patient at bedside to confirm these plans. Patient is agreeable.  SNF is ready for the patient over the weekend without concerns.   Assessment/plan status:  Psychosocial Support/Ongoing Assessment of Needs Other assessment/ plan:   FL2  PASARR   Information/referral to community resources:   SNF/STR    PATIENT'S/FAMILY'S RESPONSE TO PLAN OF CARE: Patient is agreeable to sending clinical information to [facility].  Patient is a part of the bundle and is projected to dc to [facility] tomorrow (Saturday 1/16).       Nonnie Done, [place] [phone]  Psychiatric & Orthopedics (5N 1-16) Clinical Social Worker

## 2014-08-23 NOTE — Evaluation (Signed)
Physical Therapy Evaluation Patient Details Name: Jonathan [name] MRN: [id number] DOB: June 09, 1936 Today's Date: 08/23/2014   History of Present Illness  pt is a 79 y.o. male s/p Rt TKA   Clinical Impression  Pt is s/p Rt TKA POD#1 resulting in the deficits listed below (see PT Problem List).  Pt will benefit from skilled PT to increase their independence and safety with mobility to allow discharge to the venue listed below. Pt pre-arranged for [place] place.      Follow Up Recommendations SNF    Equipment Recommendations  None recommended by PT    Recommendations for Other Services       Precautions / Restrictions Precautions Precautions: Knee Precaution Comments: educated to not have bed bent and keep Rt LE ext at rest or when not in machine  Required Braces or Orthoses: Knee Immobilizer - Right Restrictions Weight Bearing Restrictions: Yes RLE Weight Bearing: Weight bearing as tolerated      Mobility  Bed Mobility Overal bed mobility: Needs Assistance Bed Mobility: Sit to Supine       Sit to supine: Supervision   General bed mobility comments: pt relying on handrails and incr time to return to supine   Transfers Overall transfer level: Needs assistance Equipment used: Rolling walker (2 wheeled) Transfers: Sit to/from Stand Sit to Stand: Min guard         General transfer comment: cues for hand placement and safety with RW  Ambulation/Gait Ambulation/Gait assistance: Min guard Ambulation Distance (Feet): 100 Feet Assistive device: Rolling walker (2 wheeled) Gait Pattern/deviations: Step-to pattern;Trunk flexed;Decreased stance time - right;Decreased step length - left (keeps Rt LE flexed; no terminal ext) Gait velocity: decreased Gait velocity interpretation: Below normal speed for age/gender General Gait Details: cues for upright posture and step through gt sequencing; encouraged ext with stance phase; pt keeping Rt knee flexed   Stairs             Wheelchair Mobility    Modified Rankin (Stroke Patients Only)       Balance Overall balance assessment: Needs assistance Sitting-balance support: Feet supported;No upper extremity supported Sitting balance-Leahy Scale: Fair     Standing balance support: During functional activity;Bilateral upper extremity supported Standing balance-Leahy Scale: Poor Standing balance comment: RW to balance                              Pertinent Vitals/Pain Pain Assessment: 0-10 Pain Score: 5  Pain Location: Rt Knee Pain Descriptors / Indicators: Sore Pain Intervention(s): Monitored during session;Premedicated before session;Repositioned;Ice applied    Home Living Family/patient expects to be discharged to:: Skilled nursing facility                 Additional Comments: arranged to go to [facility]     Prior Function Level of Independence: Independent         Comments: Goes to a trainer at the gym     Hand Dominance        Extremity/Trunk Assessment   Upper Extremity Assessment: Defer to OT evaluation           Lower Extremity Assessment: RLE deficits/detail RLE Deficits / Details: 10 to 80 degrees     Cervical / Trunk Assessment: Normal  Communication   Communication: No difficulties  Cognition Arousal/Alertness: Awake/alert Behavior During Therapy: WFL for tasks assessed/performed Overall Cognitive Status: Within Functional Limits for tasks assessed  General Comments      Exercises Total Joint Exercises Ankle Circles/Pumps: AROM;Both;10 reps Long Arc Quad: AROM;Right;10 reps;Seated Goniometric ROM: see LE assessment       Assessment/Plan    PT Assessment Patient needs continued PT services  PT Diagnosis Difficulty walking;Generalized weakness;Acute pain   PT Problem List Decreased strength;Decreased range of motion;Decreased activity tolerance;Decreased balance;Decreased mobility;Decreased knowledge of  use of DME;Decreased safety awareness;Pain  PT Treatment Interventions DME instruction;Gait training;Functional mobility training;Therapeutic activities;Therapeutic exercise;Balance training;Neuromuscular re-education;Patient/family education   PT Goals (Current goals can be found in the Care Plan section) Acute Rehab PT Goals Patient Stated Goal: to go to [facility] tomorrow  PT Goal Formulation: With patient Time For Goal Achievement: 08/27/14 Potential to Achieve Goals: Good    Frequency 7X/week   Barriers to discharge        Co-evaluation               End of Session Equipment Utilized During Treatment: Gait belt;Right knee immobilizer Activity Tolerance: Patient tolerated treatment well Patient left: in CPM;in bed;with call bell/phone within reach;with family/visitor present Nurse Communication: Mobility status;Weight bearing status         Time: 5643-3295 PT Time Calculation (min) (ACUTE ONLY): 25 min   Charges:   PT Evaluation $Initial PT Evaluation Tier I: 1 Procedure PT Treatments $Gait Training: 8-22 mins   PT G CodesGustavus Bryant, [place]  [phone] 08/23/2014, 10:14 AM

## 2014-08-23 NOTE — Progress Notes (Signed)
OT Cancellation Note  Patient Details Name: Jonathan [name] MRN: [id number] DOB: Sep 20, 1935   Cancelled Treatment:    Reason Eval/Treat Not Completed: Other (comment) . Communicated with pt who stated that he plans to d/c to SNF (pre-arranged [facility]) which he did following his recent TKA. Pt declines OT at this time and will defer to SNF. Acute OT to sign off.    [place] Herb M   Cyndie Chime, OTR/L Occupational Therapist [phone] (pager)  08/23/2014, 11:20 AM

## 2014-08-24 ENCOUNTER — Inpatient Hospital Stay (HOSPITAL_COMMUNITY): Payer: Medicare Other

## 2014-08-24 DIAGNOSIS — M1711 Unilateral primary osteoarthritis, right knee: Principal | ICD-10-CM

## 2014-08-24 DIAGNOSIS — E039 Hypothyroidism, unspecified: Secondary | ICD-10-CM

## 2014-08-24 DIAGNOSIS — I10 Essential (primary) hypertension: Secondary | ICD-10-CM

## 2014-08-24 DIAGNOSIS — I2510 Atherosclerotic heart disease of native coronary artery without angina pectoris: Secondary | ICD-10-CM

## 2014-08-24 LAB — BASIC METABOLIC PANEL
ANION GAP: 6 (ref 5–15)
BUN: 28 mg/dL — AB (ref 6–23)
CALCIUM: 8 mg/dL — AB (ref 8.4–10.5)
CO2: 28 mmol/L (ref 19–32)
Chloride: 98 mEq/L (ref 96–112)
Creatinine, Ser: 2.81 mg/dL — ABNORMAL HIGH (ref 0.50–1.35)
GFR calc non Af Amer: 20 mL/min — ABNORMAL LOW (ref >90)
GFR, EST AFRICAN AMERICAN: 24 mL/min — AB (ref >90)
Glucose, Bld: 67 mg/dL — ABNORMAL LOW (ref 70–99)
Potassium: 3.6 mmol/L (ref 3.5–5.1)
Sodium: 132 mmol/L — ABNORMAL LOW (ref 135–145)

## 2014-08-24 LAB — CBC
HCT: 33.6 % — ABNORMAL LOW (ref 39.0–52.0)
HEMOGLOBIN: 11.3 g/dL — AB (ref 13.0–17.0)
MCH: 31.8 pg (ref 26.0–34.0)
MCHC: 33.6 g/dL (ref 30.0–36.0)
MCV: 94.6 fL (ref 78.0–100.0)
Platelets: 171 10*3/uL (ref 150–400)
RBC: 3.55 MIL/uL — ABNORMAL LOW (ref 4.22–5.81)
RDW: 14.8 % (ref 11.5–15.5)
WBC: 9.5 10*3/uL (ref 4.0–10.5)

## 2014-08-24 LAB — URINALYSIS, ROUTINE W REFLEX MICROSCOPIC
GLUCOSE, UA: NEGATIVE mg/dL
KETONES UR: 15 mg/dL — AB
LEUKOCYTES UA: NEGATIVE
Nitrite: NEGATIVE
Protein, ur: 30 mg/dL — AB
Specific Gravity, Urine: 1.022 (ref 1.005–1.030)
Urobilinogen, UA: 0.2 mg/dL (ref 0.0–1.0)
pH: 5 (ref 5.0–8.0)

## 2014-08-24 LAB — URINE MICROSCOPIC-ADD ON

## 2014-08-24 LAB — GLUCOSE, CAPILLARY
GLUCOSE-CAPILLARY: 173 mg/dL — AB (ref 70–99)
GLUCOSE-CAPILLARY: 86 mg/dL (ref 70–99)
Glucose-Capillary: 95 mg/dL (ref 70–99)
Glucose-Capillary: 77 mg/dL (ref 70–99)

## 2014-08-24 MED ORDER — BISACODYL 10 MG RE SUPP
10.0000 mg | Freq: Every day | RECTAL | Status: DC
  Administered 2014-08-25: 10 mg via RECTAL
  Filled 2014-08-24: qty 1

## 2014-08-24 MED ORDER — SODIUM CHLORIDE 0.9 % IV BOLUS (SEPSIS): 500.0000 mL | Freq: Four times a day (QID) | INTRAVENOUS | Status: DC | PRN

## 2014-08-24 MED ORDER — PIPERACILLIN-TAZOBACTAM 3.375 G IVPB
3.3750 g | Freq: Three times a day (TID) | INTRAVENOUS | Status: DC
  Administered 2014-08-25 – 2014-08-26 (×5): 3.375 g via INTRAVENOUS
  Filled 2014-08-24 (×7): qty 50

## 2014-08-24 MED ORDER — HYDROMORPHONE HCL 1 MG/ML IJ SOLN
0.5000 mg | INTRAMUSCULAR | Status: DC | PRN
  Administered 2014-08-24: 1 mg via INTRAVENOUS
  Filled 2014-08-24: qty 1

## 2014-08-24 MED ORDER — HYDROMORPHONE HCL 1 MG/ML IJ SOLN: 0.5000 mg | INTRAMUSCULAR | Status: DC | PRN

## 2014-08-24 MED ORDER — DOCUSATE SODIUM 100 MG PO CAPS
200.0000 mg | ORAL_CAPSULE | Freq: Two times a day (BID) | ORAL | Status: DC
  Administered 2014-08-24 – 2014-08-26 (×4): 200 mg via ORAL
  Filled 2014-08-24 (×4): qty 2

## 2014-08-24 MED ORDER — SODIUM CHLORIDE 0.9 % IV SOLN: INTRAVENOUS | Status: DC

## 2014-08-24 MED ORDER — METOPROLOL SUCCINATE 12.5 MG HALF TABLET
12.5000 mg | ORAL_TABLET | Freq: Every day | ORAL | Status: DC
  Filled 2014-08-24: qty 1

## 2014-08-24 MED ORDER — VANCOMYCIN HCL 10 G IV SOLR: 1500.0000 mg | INTRAVENOUS | Status: DC

## 2014-08-24 MED ORDER — SODIUM CHLORIDE 0.9 % IV BOLUS (SEPSIS)
500.0000 mL | Freq: Once | INTRAVENOUS | Status: AC
  Administered 2014-08-24: 500 mL via INTRAVENOUS

## 2014-08-24 MED ORDER — PIPERACILLIN-TAZOBACTAM 3.375 G IVPB 30 MIN
3.3750 g | INTRAVENOUS | Status: AC
  Administered 2014-08-24: 3.375 g via INTRAVENOUS
  Filled 2014-08-24: qty 50

## 2014-08-24 MED ORDER — POLYETHYLENE GLYCOL 3350 17 G PO PACK
17.0000 g | PACK | Freq: Two times a day (BID) | ORAL | Status: DC
  Administered 2014-08-24 – 2014-08-26 (×4): 17 g via ORAL
  Filled 2014-08-24 (×6): qty 1

## 2014-08-24 MED ORDER — FUROSEMIDE 20 MG PO TABS
10.0000 mg | ORAL_TABLET | Freq: Once | ORAL | Status: DC
  Filled 2014-08-24: qty 0.5

## 2014-08-24 MED ORDER — OXYCODONE HCL 5 MG PO TABS
5.0000 mg | ORAL_TABLET | ORAL | Status: DC | PRN
  Administered 2014-08-24 – 2014-08-26 (×7): 5 mg via ORAL
  Filled 2014-08-24 (×7): qty 1

## 2014-08-24 MED ORDER — VANCOMYCIN HCL 10 G IV SOLR
2000.0000 mg | INTRAVENOUS | Status: AC
  Administered 2014-08-24: 2000 mg via INTRAVENOUS
  Filled 2014-08-24: qty 2000

## 2014-08-24 NOTE — Clinical Social Work Note (Signed)
CSW spoke with patient's RN Jiles Garter, who states patient not medically ready for d/c on this date. Patient presents with trouble voiding and hypotension. Internal medicine consulted per [facility]. CSW contacted [facility] and spoke with Jolyne Loa. CSW made Ivin Booty aware of potential Sunday 08/25/14 d/c. Patient will d/c to room# 1206 and report is to be given to RN Supervisor [phone], d/c summary is to be faxed to: [phone]. CSW to follow tomorrow.  [place], [place] Weekend Clinical Social Worker [phone]

## 2014-08-24 NOTE — Progress Notes (Signed)
Physical Therapy Treatment Patient Details Name: Jonathan [name] MRN: [id number] DOB: Jun 26, 1936 Today's Date: 08/24/2014    History of Present Illness pt is a 79 y.o. male s/p Rt TKA     PT Comments    Pt lethargic & difficulty keeping eyes open upon arrival but improved once OOB & ambulating.  Pt currently requires supervision for bed mobility, min assist to achieve standing, & min guard for safety with ambulation.  Cont with current POC & d/c recommendation of SNF to maximize functional mobility prior to d/c home.     Follow Up Recommendations  SNF     Equipment Recommendations  None recommended by PT    Recommendations for Other Services       Precautions / Restrictions Precautions Precautions: Knee Precaution Comments: educated that he doesn't need immobilizer on when he is in the bed. Required Braces or Orthoses: Knee Immobilizer - Right Restrictions RLE Weight Bearing: Weight bearing as tolerated    Mobility  Bed Mobility Overal bed mobility: Needs Assistance Bed Mobility: Supine to Sit     Supine to sit: Supervision     General bed mobility comments: Pt relying on trapeze bar initially to bring trunk to sitting upright. Encouraged pt to not use due to he will not have one at home.    Transfers Overall transfer level: Needs assistance Equipment used: Rolling walker (2 wheeled) Transfers: Sit to/from Stand Sit to Stand: Min guard         General transfer comment: cues for hand placement.  height of bed elevated due to pt unable to stand from lower height  Ambulation/Gait Ambulation/Gait assistance: Min guard Ambulation Distance (Feet): 120 Feet Assistive device: Rolling walker (2 wheeled) Gait Pattern/deviations: Step-through pattern;Decreased stride length     General Gait Details: cues for posture, keep eyes open, push RW rather than pick up, quad activation during stance phase RLE, & encouragement to increase LLE step length.     Stairs             Wheelchair Mobility    Modified Rankin (Stroke Patients Only)       Balance                                    Cognition Arousal/Alertness: Lethargic;Suspect due to medications Behavior During Therapy: [facility] for tasks assessed/performed Overall Cognitive Status: Within Functional Limits for tasks assessed                      Exercises      General Comments        Pertinent Vitals/Pain Pain Assessment: No/denies pain    Home Living                      Prior Function            PT Goals (current goals can now be found in the care plan section) Acute Rehab PT Goals PT Goal Formulation: With patient Time For Goal Achievement: 08/27/14 Potential to Achieve Goals: Good Progress towards PT goals: Progressing toward goals    Frequency  7X/week    PT Plan Current plan remains appropriate    Co-evaluation             End of Session Equipment Utilized During Treatment: Gait belt;Right knee immobilizer Activity Tolerance: Patient tolerated treatment well Patient left: in chair;with call bell/phone within reach  Time: 2330-0762 PT Time Calculation (min) (ACUTE ONLY): 15 min  Charges:  $Gait Training: 8-22 mins                    G Codes:      Sena Hitch 08/24/2014, 12:37 PM   Sarajane Marek, PTA [phone] 08/24/2014

## 2014-08-24 NOTE — Progress Notes (Signed)
Patient has yet to urinate spontaneously Bladder scan did not reveal enough urine to cath  BP 80/75 mmHg  Pulse 70  Temp(Src) 98.1 F (36.7 C) (Oral)  Resp 16  Ht 6' 2.5" (1.892 m)  Wt 111.131 kg (245 lb)  BMI 31.05 kg/m2  SpO2 93%  Dressing CDI NVI  POD #2 after right TKA, with inability to void, and with hypotension  - given hypotension and inability to void will consult medicine for input on management (they have been called) - will give fluid bolus in meantime - f/u medicine recs later today

## 2014-08-24 NOTE — Significant Event (Signed)
Rapid Response Event Note  Overview: Time Called: [place] Time: 1817 Event Type: Other (Comment)  Initial Focused Assessment:  Called by primary RN for a second set of eyes due to patient be flushed and not looking good.  Upon my arrival to patients room, Rn and family at bedside.  Patient is alert and oriented lying in bed.  Patient c/o pain in his backside and about the inability to urinate.  Family and patient concerned that he has not gone down to ultrasound yet. Patient is flushed and skin is hot to touch, patient c/o of feeling cold.  As per Rn has had low BP and received 2 boluses throughout the day.  Patient and family requesting a urology consult.     Interventions:  Recommended a rectal temp which was 102.1.  Bladder scan done shows volume of 471ml.  Recommended RN to call MD and inform about temp and collecting blood and urine cultures    Event Summary:  RN to call if assistance needed   at      at          [facility], Harlin Rain

## 2014-08-24 NOTE — Progress Notes (Addendum)
During hourly rounding check, patient noted to be flushed, shivering, and hot to the touch. Patient oriented, but behavior has changed, and patient is muttering and acting strange.  Patient's behavior change concerning to RN. CBG 86, VS stable. Temp 97.9. Patients wife , also stated concern for behavior change and chills. Rapid RN called just for second assessment and opinion of situation. Repeated bladder scan showing 470 cc. Urinary catheter placed. Rapid RN suggested rectal temp, which showed temp of 102.1. Dr. Candiss Norse notified and made aware. Orders placed for Chest Xray, blood cultures, antibiotics, and PRN fluid bolus. Tylenol given to patient. Will inform oncoming shift to inform IM doctor of Renal US and Chest xray results.

## 2014-08-24 NOTE — Progress Notes (Addendum)
ANTIBIOTIC CONSULT NOTE - INITIAL  Pharmacy Consult for vancomycin and zosyn Indication: rule out sepsis  Allergies  Allergen Reactions  . Bee Venom Anaphylaxis    Patient Measurements: Height: 6' 2.5" (189.2 cm) Weight: 245 lb (111.131 kg) (Simultaneous filing. User may not have seen previous data.) IBW/kg (Calculated) : 83.35   Vital Signs: Temp: 102.1 F (38.9 C) (01/16 1829) Temp Source: Rectal (01/16 1829) BP: 101/61 mmHg (01/16 1711) Pulse Rate: 91 (01/16 1711) Intake/Output from previous day: 01/15 0701 - 01/16 0700 In: -  Out: 500 [Urine:500] Intake/Output from this shift:    Labs:  Recent Labs  08/23/14 0539 08/24/14 0432  WBC 5.9 9.5  HGB 12.4* 11.3*  PLT 156 171  CREATININE 1.04  --    Estimated Creatinine Clearance: 80.8 mL/min (by C-G formula based on Cr of 1.04). No results for input(s): VANCOTROUGH, VANCOPEAK, VANCORANDOM, GENTTROUGH, GENTPEAK, GENTRANDOM, TOBRATROUGH, TOBRAPEAK, TOBRARND, AMIKACINPEAK, AMIKACINTROU, AMIKACIN in the last 72 hours.   Microbiology: Recent Results (from the past 720 hour(s))  Surgical pcr screen     Status: None   Collection Time: 08/14/14  8:52 AM  Result Value Ref Range Status   MRSA, PCR NEGATIVE NEGATIVE Final   Staphylococcus aureus NEGATIVE NEGATIVE Final    Comment:        The Xpert SA Assay (FDA approved for NASAL specimens in patients over 57 years of age), is one component of a comprehensive surveillance program.  Test performance has been validated by [employer] for patients greater than or equal to 37 year old. It is not intended to diagnose infection nor to guide or monitor treatment.   Urine culture     Status: None   Collection Time: 08/14/14  8:55 AM  Result Value Ref Range Status   Specimen Description URINE, CLEAN CATCH  Final   Special Requests NONE  Final   Colony Count   Final    5,000 COLONIES/ML Performed at [facility]    Culture   Final    INSIGNIFICANT  GROWTH Performed at [facility]    Report Status 08/15/2014 FINAL  Final    Medical History: Past Medical History  Diagnosis Date  . CAD (coronary artery disease)     CABG 2005 by Dr Cyndia Bent  . Hyperlipidemia   . Hypertension   . Hypothyroidism   . Sun-damaged skin   . Benign prostatic hypertrophy   . Gout   . DJD (degenerative joint disease)     bilateral knee, gout  . Hepatitis A 1950  . Hepatitis B 1980's  . Type II diabetes mellitus   . Sleep apnea 1990's    "before uvulectomy"  . GERD (gastroesophageal reflux disease)   . Squamous cell carcinoma     "lots on my arms & face" (09/21/2013)  . Personal history of other infectious and parasitic disease   . Knee joint replacement by other means   . Glomerulonephritis acute     at age 30, due to beta strep, resolved, renal calculi- passed spontaneously  . HOH (hard of hearing)     wears hearing aids  . Incontinence of urine    Assessment: Patient is a 79 y.o M s/p TKR on 1/14.  He's now hypotensive and febrile with Tmax 102.1.To start broad abx for suspected sepsis.    Last scr was on 1/15 but with c/o of low urine output today, will check BMP now to access current renal function for abx dose.   Goal of Therapy:  Vancomycin trough level 15-20 mcg/ml  Plan:  - vancomycin 2gm IV x1 now - zosyn 3.375gm IV over 30 minutes - BMP now (will f/u and enter maintenance doses for abx based on renal function)  Aamari Strawderman P 08/24/2014,7:08 PM  Adden: scr 2.81 (crcl~ 23) with new BMP drawn tonight - zosyn 3.375gm IV q8h (over 4 hrs for) - vancomycin 1500mg  q48h

## 2014-08-24 NOTE — Consult Note (Signed)
Patient Demographics  Jonathan [name], is a 79 y.o. male   MRN: [id number]   DOB - 1936/01/06  Admit Date - 08/22/2014    Outpatient Primary MD for the patient is TODD,JEFFREY [name], MD  Consult requested in the Hospital by Hessie Dibble, MD, On 08/24/2014    Reason for consult low blood pressure, increase urine output, problems emptying bladder   With History of -  Past Medical History  Diagnosis Date  . CAD (coronary artery disease)     CABG 2005 by Dr Cyndia Bent  . Hyperlipidemia   . Hypertension   . Hypothyroidism   . Sun-damaged skin   . Benign prostatic hypertrophy   . Gout   . DJD (degenerative joint disease)     bilateral knee, gout  . Hepatitis A 1950  . Hepatitis B 1980's  . Type II diabetes mellitus   . Sleep apnea 1990's    "before uvulectomy"  . GERD (gastroesophageal reflux disease)   . Squamous cell carcinoma     "lots on my arms & face" (09/21/2013)  . Personal history of other infectious and parasitic disease   . Knee joint replacement by other means   . Glomerulonephritis acute     at age 65, due to beta strep, resolved, renal calculi- passed spontaneously  . HOH (hard of hearing)     wears hearing aids  . Incontinence of urine       Past Surgical History  Procedure Laterality Date  . Coronary artery bypass graft  01/23/04    Dr Cyndia Bent  . Inguinal hernia repair Bilateral 1980's - 1990's  . Umbilical hernia repair    . Mastoidectomy Right 1943  . Quadriceps repair Right 2009  . Uvulopalatoplasty  [id number]    ENT MD  . Total knee arthroplasty Left 09/18/2013    Procedure: TOTAL KNEE ARTHROPLASTY;  Surgeon: Hessie Dibble, MD;  Location: [facility];  Service: Orthopedics;  Laterality: Left;  . Cataract extraction w/ intraocular lens  implant, bilateral Bilateral   . Retinal detachment surgery Right  X 2  . Cardiac catheterization    . Tonsillectomy  ~ 1943  . Eye surgery Right     detached retina ,blind in right eye    in for   No chief complaint on file.    HPI  Jonathan [name]  is a 79 y.o. male,  with history of CAD status post CABG, dyslipidemia, hypertension, hypotension, severe BPH on 2 alpha blockers, history of hep B and hep A, GERD, type 2 diabetes mellitus, who was limited today's ago by orthopedic surgery for an elective right total knee arthroplasty. He underwent the procedure well but after surgery he had problems passing urine and required straight catheterization twice, today he was noted to be hypotensive with only 200 mL of urine in the bladder and the last 12 hours. I was called to evaluate him for these problems. Patient is a retired Youth worker, he is usually in good health, does not have any  subjective complaints except problems passing urine, denies any fever chills, no headache, no chest abdominal pain, no shortness of breath, he has no sensation of suprapubic fullness. No focal weakness.  Review of Systems    In addition to the HPI above,   No Fever-chills, No Headache, No changes with Vision or hearing, No problems swallowing food or Liquids, No Chest pain, Cough or Shortness of Breath, No Abdominal pain, No Nausea or Vommitting, Bowel movements are regular, No Blood in stool or Urine, No dysuria, does have problems passing urine No new skin rashes or bruises, No new joints pains-aches,  No new weakness, tingling, numbness in any extremity, No recent weight gain or loss, No polyuria, polydypsia or polyphagia, No significant Mental Stressors.  A full 10 point Review of Systems was done, except as stated above, all other Review of Systems were negative.   Social History History  Substance Use Topics  . Smoking status: Former Smoker -- 1.00 packs/day for 10 years    Types: Cigarettes    Quit date: 09/09/1957  . Smokeless tobacco: Not on file   . Alcohol Use: 8.4 oz/week    14 Shots of liquor per week     Comment: 1 2 oz shots of vodka per night      Family History Family History  Problem Relation Age of Onset  . Diabetes Other     1st degree relative  . Hypertension      Family History      Prior to Admission medications   Medication Sig Start Date End Date Taking? Authorizing Provider  allopurinol (ZYLOPRIM) 300 MG tablet TAKE 1 TABLET EVERY DAY 04/16/14  Yes Dorena Cookey, MD  benazepril-hydrochlorthiazide (LOTENSIN HCT) 20-25 MG per tablet TAKE 1 TABLET EVERY DAY 02/13/14  Yes Dorena Cookey, MD  glipiZIDE (GLUCOTROL) 10 MG tablet Take 1 tablet (10 mg total) by mouth 2 (two) times daily. 12/04/13  Yes Dorena Cookey, MD  insulin glargine (LANTUS) 100 UNIT/ML injection Inject 0.3 mLs (30 Units total) into the skin at bedtime. Patient taking differently: Inject 30 Units into the skin 2 (two) times daily as needed (cbg over 110).  12/04/13  Yes Dorena Cookey, MD  levothyroxine (SYNTHROID, LEVOTHROID) 100 MCG tablet Take 1 tablet (100 mcg total) by mouth every morning. Patient taking differently: Take 100 mcg by mouth daily before breakfast.  12/04/13  Yes Dorena Cookey, MD  metFORMIN (GLUCOPHAGE) 1000 MG tablet Take 1 tablet (1,000 mg total) by mouth 2 (two) times daily with a meal. 12/04/13  Yes Dorena Cookey, MD  metoprolol succinate (TOPROL-XL) 25 MG 24 hr tablet Take 0.5 tablets (12.5 mg total) by mouth daily. 02/04/14  Yes Thompson Grayer, MD  mirabegron ER (MYRBETRIQ) 50 MG TB24 tablet Take 50 mg by mouth daily before breakfast.    Yes Historical Provider, MD  Multiple Vitamin (MULTIVITAMIN PO) Take 1 tablet by mouth daily.     Yes Historical Provider, MD  niacin 100 MG tablet Take 100 mg by mouth daily.     Yes Historical Provider, MD  nitroGLYCERIN (NITROSTAT) 0.4 MG SL tablet Place 1 tablet (0.4 mg total) under the tongue as needed. Patient taking differently: Place 0.4 mg under the tongue as needed for chest pain.   12/04/13  Yes Dorena Cookey, MD  Omega-3 Fatty Acids (FISH OIL) 1000 MG CAPS Take 1,000 mg by mouth 2 (two) times daily.   Yes Historical Provider, MD  omeprazole (PRILOSEC) 20 MG capsule Take  1 capsule (20 mg total) by mouth 2 (two) times daily before a meal. Patient taking differently: Take 20 mg by mouth 2 (two) times daily as needed (acid reflux).  12/06/13  Yes Dorena Cookey, MD  pioglitazone (ACTOS) 45 MG tablet Take 1 tablet (45 mg total) by mouth daily. 12/04/13  Yes Dorena Cookey, MD  simvastatin (ZOCOR) 40 MG tablet Take 1 tablet (40 mg total) by mouth at bedtime. 12/04/13  Yes Dorena Cookey, MD  terazosin (HYTRIN) 1 MG capsule Take 1 capsule (1 mg total) by mouth at bedtime as needed (overactive bladder). 12/04/13  Yes Dorena Cookey, MD  aspirin 325 MG EC tablet Take 1 tablet (325 mg total) by mouth 2 (two) times daily. 08/23/14   Rich Fuchs, PA-C  cetirizine (ZYRTEC) 10 MG tablet Take 10 mg by mouth daily as needed for allergies.    Historical Provider, MD  furosemide (LASIX) 20 MG tablet TAKE ONE TABLET BY MOUTH EVERY DAY Patient not taking: Reported on 08/13/2014    Dorena Cookey, MD  furosemide (LASIX) 20 MG tablet Take 20 mg by mouth daily as needed for fluid or edema.    Historical Provider, MD  lisinopril-hydrochlorothiazide (PRINZIDE,ZESTORETIC) 20-25 MG per tablet Take 1 tablet by mouth daily. Patient not taking: Reported on 08/13/2014 12/04/13   Dorena Cookey, MD  methocarbamol (ROBAXIN) 500 MG tablet Take 1 tablet (500 mg total) by mouth every 6 (six) hours as needed for muscle spasms. 08/23/14   Rich Fuchs, PA-C  oxyCODONE (OXY IR/ROXICODONE) 5 MG immediate release tablet Take 1-2 tablets (5-10 mg total) by mouth every 4 (four) hours as needed for breakthrough pain. 08/23/14   Rich Fuchs, PA-C  tamsulosin (FLOMAX) 0.4 MG CAPS capsule Take 1 capsule (0.4 mg total) by mouth daily. 08/23/14   Rich Fuchs, PA-C    Anti-infectives    Start     Dose/Rate Route  Frequency Ordered Stop   08/22/14 2100  ceFAZolin (ANCEF) IVPB 2 g/50 mL premix     2 g100 mL/hr over 30 Minutes Intravenous Every 6 hours 08/22/14 2056 08/23/14 0520   08/22/14 0600  ceFAZolin (ANCEF) IVPB 2 g/50 mL premix     2 g100 mL/hr over 30 Minutes Intravenous On call to O.R. 08/21/14 1449 08/22/14 1549      Scheduled Meds: . allopurinol  300 mg Oral Daily  . aspirin EC  325 mg Oral BID PC  . bisacodyl  10 mg Rectal Daily  . docusate sodium  200 mg Oral BID  . furosemide  10 mg Oral Once  . glipiZIDE  10 mg Oral BID WC  . insulin aspart  0-15 Units Subcutaneous TID WC  . insulin glargine  30 Units Subcutaneous BID  . levothyroxine  100 mcg Oral QAC breakfast  . metFORMIN  1,000 mg Oral BID WC  . [START ON 08/25/2014] metoprolol succinate  12.5 mg Oral Daily  . mirabegron ER  50 mg Oral QAC breakfast  . niacin  100 mg Oral Daily  . pantoprazole  40 mg Oral Daily  . pioglitazone  45 mg Oral Q breakfast  . polyethylene glycol  17 g Oral BID  . simvastatin  40 mg Oral QHS  . sodium chloride  500 mL Intravenous Once  . tamsulosin  0.4 mg Oral Daily   Continuous Infusions: . sodium chloride     PRN Meds:.acetaminophen **OR** acetaminophen, alum & mag hydroxide-simeth, HYDROmorphone (DILAUDID) injection, nitroGLYCERIN, ondansetron **OR** ondansetron (  ZOFRAN) IV, oxyCODONE, terazosin  Allergies  Allergen Reactions  . Bee Venom Anaphylaxis    Physical Exam  Vitals  Blood pressure 80/75, pulse 70, temperature 98.1 F (36.7 C), temperature source Oral, resp. rate 16, height 6' 2.5" (1.892 m), weight 111.131 kg (245 lb), SpO2 93 %.   1. General elderly white male lying in bed in NAD,     2. Normal affect and insight, Not Suicidal or Homicidal, Awake Alert, Oriented X 3.  3. No F.N deficits, ALL C.Nerves Intact, Strength 5/5 all 4 extremities, Sensation intact all 4 extremities, Plantars down going.  4. Ears and Eyes appear Normal, Conjunctivae clear, PERRLA. Moist Oral  Mucosa.  5. Supple Neck, No JVD, No cervical lymphadenopathy appriciated, No Carotid Bruits.  6. Symmetrical Chest wall movement, Good air movement bilaterally, CTAB.  7. RRR, No Gallops, Rubs or Murmurs, No Parasternal Heave.  8. Positive Bowel Sounds, Abdomen Soft, No tenderness, No organomegaly appriciated,No rebound -guarding or rigidity.  9.  No Cyanosis, Normal Skin Turgor, No Skin Rash or Bruise.  10. Good muscle tone,  joints appear normal , no effusions, Normal ROM. Right knee under bandage and brace  11. No Palpable Lymph Nodes in Neck or Axillae     Data Review  CBC  Recent Labs Lab 08/23/14 0539 08/24/14 0432  WBC 5.9 9.5  HGB 12.4* 11.3*  HCT 36.1* 33.6*  PLT 156 171  MCV 93.5 94.6  MCH 32.1 31.8  MCHC 34.3 33.6  RDW 14.3 14.8   ------------------------------------------------------------------------------------------------------------------  Chemistries   Recent Labs Lab 08/22/14 1306 08/23/14 0539  NA  --  137  K  --  4.0  CL  --  102  CO2  --  26  GLUCOSE  --  185*  BUN  --  12  CREATININE  --  1.04  CALCIUM  --  8.4  AST 31  --   ALT 20  --   ALKPHOS 52  --   BILITOT 0.7  --    ------------------------------------------------------------------------------------------------------------------ estimated creatinine clearance is 80.8 mL/min (by C-G formula based on Cr of 1.04). ------------------------------------------------------------------------------------------------------------------ No results for input(s): TSH, T4TOTAL, T3FREE, THYROIDAB in the last 72 hours.  Invalid input(s): FREET3   Coagulation profile No results for input(s): INR, PROTIME in the last 168 hours. ------------------------------------------------------------------------------------------------------------------- No results for input(s): DDIMER in the last 72  hours. -------------------------------------------------------------------------------------------------------------------  Cardiac Enzymes No results for input(s): CKMB, TROPONINI, MYOGLOBIN in the last 168 hours.  Invalid input(s): CK ------------------------------------------------------------------------------------------------------------------ Invalid input(s): POCBNP   ---------------------------------------------------------------------------------------------------------------  Urinalysis    Component Value Date/Time   COLORURINE YELLOW 09/14/2013 1308   APPEARANCEUR CLEAR 09/14/2013 1308   LABSPEC 1.023 09/14/2013 1308   PHURINE 5.0 09/14/2013 1308   GLUCOSEU NEGATIVE 09/14/2013 1308   HGBUR NEGATIVE 09/14/2013 1308   HGBUR 3+ 01/03/2009 0937   BILIRUBINUR 1+ 12/04/2013 1122   BILIRUBINUR NEGATIVE 09/14/2013 1308   KETONESUR NEGATIVE 09/14/2013 1308   PROTEINUR n 12/04/2013 1122   PROTEINUR NEGATIVE 09/14/2013 1308   UROBILINOGEN 0.2 12/04/2013 1122   UROBILINOGEN 0.2 09/14/2013 1308   NITRITE n 12/04/2013 1122   NITRITE NEGATIVE 09/14/2013 1308   LEUKOCYTESUR Negative 12/04/2013 1122     Imaging results:   No results found.      Assessment & Plan    1. Hypotension and low urine output. He is dehydrated also has antihypertensive medications on board, offending medications have been stopped, will hydrate him with IV fluids and maintenance normal saline, will monitor bladder scan, if  he develops problems passing Urine Will place Foley as he has had to straight catheterizations already.   2. BPH. On 2 alpha blockers, does have evidence of outflow tract obstruction, worsened by narcotics, constipation and lack of activity, bowel regimen initiated, narcotics minimized, hydrate, if unable to clear Bladder Will place Foley. And follow with urology outpatient.   3. DM type II. Currently stable on regimen. Includes oral and sliding scale.  Lab Results   Component Value Date   HGBA1C 6.1 12/04/2013    CBG (last 3)   Recent Labs  08/23/14 1652 08/23/14 2137 08/24/14 0639  GLUCAP 191* 201* 173*     4. CAD. No acute issues chest pain free continue present medications unchanged. Nitroglycerin as needed along with low-dose beta blocker from tomorrow.   5. Hypothyroidism. Continue home dose Synthroid.     DVT Prophylaxis ASA BID per Ortho - SCDs    AM Labs Ordered, also please review Full Orders  Family Communication: Plan discussed with patient    Thank you for the consult, we will follow the patient with you in the Hospital.   Lala Lund K M.D on 08/24/2014 at 11:53 AM  Between 7am to 7pm - Pager - [phone]  After 7pm go to [email or web address] - password [email or web address]   Thank you for the consult, we will follow the patient with you in the [facility] Office  [phone]

## 2014-08-25 LAB — BASIC METABOLIC PANEL
ANION GAP: 10 (ref 5–15)
BUN: 25 mg/dL — ABNORMAL HIGH (ref 6–23)
CHLORIDE: 98 meq/L (ref 96–112)
CO2: 26 mmol/L (ref 19–32)
Calcium: 7.9 mg/dL — ABNORMAL LOW (ref 8.4–10.5)
Creatinine, Ser: 2.18 mg/dL — ABNORMAL HIGH (ref 0.50–1.35)
GFR calc Af Amer: 32 mL/min — ABNORMAL LOW (ref >90)
GFR calc non Af Amer: 28 mL/min — ABNORMAL LOW (ref >90)
GLUCOSE: 79 mg/dL (ref 70–99)
Potassium: 3.7 mmol/L (ref 3.5–5.1)
Sodium: 134 mmol/L — ABNORMAL LOW (ref 135–145)

## 2014-08-25 LAB — CBC
HEMATOCRIT: 28.1 % — AB (ref 39.0–52.0)
Hemoglobin: 9.6 g/dL — ABNORMAL LOW (ref 13.0–17.0)
MCH: 31.4 pg (ref 26.0–34.0)
MCHC: 34.2 g/dL (ref 30.0–36.0)
MCV: 91.8 fL (ref 78.0–100.0)
Platelets: 142 10*3/uL — ABNORMAL LOW (ref 150–400)
RBC: 3.06 MIL/uL — ABNORMAL LOW (ref 4.22–5.81)
RDW: 14.8 % (ref 11.5–15.5)
WBC: 8.3 10*3/uL (ref 4.0–10.5)

## 2014-08-25 LAB — GLUCOSE, CAPILLARY
GLUCOSE-CAPILLARY: 70 mg/dL (ref 70–99)
GLUCOSE-CAPILLARY: 102 mg/dL — AB (ref 70–99)
GLUCOSE-CAPILLARY: 58 mg/dL — AB (ref 70–99)
GLUCOSE-CAPILLARY: 113 mg/dL — AB (ref 70–99)
Glucose-Capillary: 60 mg/dL — ABNORMAL LOW (ref 70–99)
Glucose-Capillary: 99 mg/dL (ref 70–99)
Glucose-Capillary: 180 mg/dL — ABNORMAL HIGH (ref 70–99)

## 2014-08-25 MED ORDER — SODIUM CHLORIDE 0.9 % IV SOLN
INTRAVENOUS | Status: DC
  Administered 2014-08-25: via INTRAVENOUS

## 2014-08-25 MED ORDER — GLIPIZIDE 5 MG PO TABS
5.0000 mg | ORAL_TABLET | Freq: Two times a day (BID) | ORAL | Status: DC
  Filled 2014-08-25 (×3): qty 1

## 2014-08-25 MED ORDER — DOXYCYCLINE HYCLATE 100 MG PO TABS
100.0000 mg | ORAL_TABLET | Freq: Two times a day (BID) | ORAL | Status: DC
  Administered 2014-08-25 – 2014-08-26 (×3): 100 mg via ORAL
  Filled 2014-08-25 (×4): qty 1

## 2014-08-25 MED ORDER — INSULIN GLARGINE 100 UNIT/ML ~~LOC~~ SOLN
20.0000 [IU] | Freq: Two times a day (BID) | SUBCUTANEOUS | Status: DC
  Administered 2014-08-26: 20 [IU] via SUBCUTANEOUS
  Filled 2014-08-25 (×2): qty 0.2

## 2014-08-25 MED ORDER — PIOGLITAZONE HCL 30 MG PO TABS
30.0000 mg | ORAL_TABLET | Freq: Every day | ORAL | Status: DC
  Administered 2014-08-25 – 2014-08-26 (×2): 30 mg via ORAL
  Filled 2014-08-25 (×3): qty 1

## 2014-08-25 MED ORDER — SODIUM CHLORIDE 0.9 % IV SOLN: INTRAVENOUS | Status: DC

## 2014-08-25 MED ORDER — DEXTROSE 50 % IV SOLN
25.0000 mL | Freq: Once | INTRAVENOUS | Status: AC
  Administered 2014-08-25: 25 mL via INTRAVENOUS
  Filled 2014-08-25: qty 50

## 2014-08-25 MED ORDER — INSULIN ASPART 100 UNIT/ML ~~LOC~~ SOLN
0.0000 [IU] | Freq: Three times a day (TID) | SUBCUTANEOUS | Status: DC
  Administered 2014-08-26: 3 [IU] via SUBCUTANEOUS

## 2014-08-25 MED ORDER — INSULIN ASPART 100 UNIT/ML ~~LOC~~ SOLN: 0.0000 [IU] | Freq: Every day | SUBCUTANEOUS | Status: DC

## 2014-08-25 MED ORDER — INSULIN GLARGINE 100 UNIT/ML ~~LOC~~ SOLN
20.0000 [IU] | Freq: Two times a day (BID) | SUBCUTANEOUS | Status: DC
  Administered 2014-08-25: 20 [IU] via SUBCUTANEOUS
  Filled 2014-08-25 (×2): qty 0.2

## 2014-08-25 NOTE — Consult Note (Addendum)
Consult: Urinary retention Requested by: Dr. 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 He's been having some difficulty with voiding and required in and out cath. He's been cath for 500-900 mL. He had some fever and low blood pressure yesterday and a bladder scan red 470 mL therefore a Foley catheter was placed. The patient's been afebrile more stable today. Urine and blood cultures are pending. He is making excellent urine output with 1200 mL of clear urine in the bag now. Of note his creatinine did go up to 2.81 from a baseline around 1 but this was thought to be due to some hypotension and blood pressure meds. A renal ultrasound was done which showed the bladder was decompressed with a Foley catheter and there was no hydronephrosis. 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    I reviewed all the images. Patient has made progress between yesterday and this evening. He is able to get around and transfer. His been working with PT. His been having bowel movements. His narcotic requirement has decreased significantly. I did speak to his wife and she feels like his made good progress.  He does have a history of some obstructive as well as irritative bladder symptoms has been on tamsulosin and Myrbetriq in the past. Currently is on tamsulosin and terazosin 1 mg. He is also on doxycycline and Zosyn.  Past Medical History  Diagnosis Date  . CAD (coronary artery disease)     CABG 2005 by Dr Cyndia Bent  . Hyperlipidemia   . Hypertension   . Hypothyroidism   . Sun-damaged skin   . Benign prostatic hypertrophy   . Gout   . DJD (degenerative joint disease)     bilateral knee, gout  . Hepatitis A 1950  . Hepatitis B 1980's  . Type II diabetes mellitus   . Sleep apnea 1990's    "before uvulectomy"  . GERD (gastroesophageal reflux disease)   . Squamous cell carcinoma     "lots on my arms & face" (09/21/2013)  . Personal  history of other infectious and parasitic disease   . Knee joint replacement by other means   . Glomerulonephritis acute     at age 24, due to beta strep, resolved, renal calculi- passed spontaneously  . HOH (hard of hearing)     wears hearing aids  . Incontinence of urine    Past Surgical History  Procedure Laterality Date  . Coronary artery bypass graft  01/23/04    Dr Cyndia Bent  . Inguinal hernia repair Bilateral 1980's - 1990's  . Umbilical hernia repair    . Mastoidectomy Right 1943  . Quadriceps repair Right 2009  . Uvulopalatoplasty  [id number]    ENT MD  . Total knee arthroplasty Left 09/18/2013    Procedure: TOTAL KNEE ARTHROPLASTY;  Surgeon: Hessie Dibble, MD;  Location: [facility];  Service: Orthopedics;  Laterality: Left;  . Cataract extraction w/ intraocular lens  implant, bilateral Bilateral   . Retinal detachment surgery Right X 2  . Cardiac catheterization    . Tonsillectomy  ~ 1943  . Eye surgery Right     detached retina ,blind in right eye    Home Medications:  Prescriptions prior to admission  Medication Sig Dispense Refill Last Dose  . allopurinol (ZYLOPRIM) 300 MG tablet TAKE 1 TABLET EVERY DAY 90 tablet 3 08/22/2014 at Unknown time  . benazepril-hydrochlorthiazide (LOTENSIN HCT) 20-25 MG per  tablet TAKE 1 TABLET EVERY DAY 90 tablet 3 08/22/2014 at Unknown time  . glipiZIDE (GLUCOTROL) 10 MG tablet Take 1 tablet (10 mg total) by mouth 2 (two) times daily. 200 tablet 3 08/22/2014 at Unknown time  . insulin glargine (LANTUS) 100 UNIT/ML injection Inject 0.3 mLs (30 Units total) into the skin at bedtime. (Patient taking differently: Inject 30 Units into the skin 2 (two) times daily as needed (cbg over 110). ) 10 mL 10 08/21/2014 at Unknown time  . levothyroxine (SYNTHROID, LEVOTHROID) 100 MCG tablet Take 1 tablet (100 mcg total) by mouth every morning. (Patient taking differently: Take 100 mcg by mouth daily before breakfast. ) 100 tablet 3 08/22/2014 at Unknown time  .  metFORMIN (GLUCOPHAGE) 1000 MG tablet Take 1 tablet (1,000 mg total) by mouth 2 (two) times daily with a meal. 200 tablet 4 08/22/2014 at Unknown time  . metoprolol succinate (TOPROL-XL) 25 MG 24 hr tablet Take 0.5 tablets (12.5 mg total) by mouth daily. 45 tablet 3 08/22/2014 at 0700  . mirabegron ER (MYRBETRIQ) 50 MG TB24 tablet Take 50 mg by mouth daily before breakfast.    Taking  . Multiple Vitamin (MULTIVITAMIN PO) Take 1 tablet by mouth daily.     08/22/2014 at Unknown time  . niacin 100 MG tablet Take 100 mg by mouth daily.     08/22/2014 at Unknown time  . nitroGLYCERIN (NITROSTAT) 0.4 MG SL tablet Place 1 tablet (0.4 mg total) under the tongue as needed. (Patient taking differently: Place 0.4 mg under the tongue as needed for chest pain. ) 25 tablet 1 Taking  . Omega-3 Fatty Acids (FISH OIL) 1000 MG CAPS Take 1,000 mg by mouth 2 (two) times daily.   Past Week at Unknown time  . omeprazole (PRILOSEC) 20 MG capsule Take 1 capsule (20 mg total) by mouth 2 (two) times daily before a meal. (Patient taking differently: Take 20 mg by mouth 2 (two) times daily as needed (acid reflux). ) 180 capsule 3 08/21/2014 at Unknown time  . pioglitazone (ACTOS) 45 MG tablet Take 1 tablet (45 mg total) by mouth daily. 90 tablet 3 08/22/2014 at Unknown time  . simvastatin (ZOCOR) 40 MG tablet Take 1 tablet (40 mg total) by mouth at bedtime. 100 tablet 3 08/22/2014 at Unknown time  . terazosin (HYTRIN) 1 MG capsule Take 1 capsule (1 mg total) by mouth at bedtime as needed (overactive bladder). 90 capsule 3 08/22/2014 at Unknown time  . [DISCONTINUED] aspirin 325 MG EC tablet Take 325 mg by mouth daily.   Past Week at Unknown time  . cetirizine (ZYRTEC) 10 MG tablet Take 10 mg by mouth daily as needed for allergies.   Unknown at Unknown time  . furosemide (LASIX) 20 MG tablet TAKE ONE TABLET BY MOUTH EVERY DAY (Patient not taking: Reported on 08/13/2014) 100 tablet 3 Not Taking at Unknown time  . furosemide (LASIX) 20 MG  tablet Take 20 mg by mouth daily as needed for fluid or edema.   Unknown at Unknown time  . [DISCONTINUED] oxyCODONE (OXY IR/ROXICODONE) 5 MG immediate release tablet Take 1-2 tablets (5-10 mg total) by mouth every 3 (three) hours as needed for breakthrough pain. 80 tablet 0 Unknown at Unknown time   Allergies:  Allergies  Allergen Reactions  . Bee Venom Anaphylaxis    Family History  Problem Relation Age of Onset  . Diabetes Other     1st degree relative  . Hypertension      Family History  Social History:  reports that he quit smoking about 56 years ago. His smoking use included Cigarettes. He has a 10 pack-year smoking history. He does not have any smokeless tobacco history on file. He reports that he drinks about 8.4 oz of alcohol per week. He reports that he does not use illicit drugs.  ROS: A complete review of systems was performed.  All systems are negative except for pertinent findings as noted. Review of Systems  All other systems reviewed and are negative.    Physical Exam:  Vital signs in last 24 hours: Temp:  [98 F (36.7 C)-99 F (37.2 C)] 99 F (37.2 C) (01/17 1500) Pulse Rate:  [80-84] 84 (01/17 1500) Resp:  [16-20] 16 (01/17 1600) BP: (80-115)/(43-67) 99/58 mmHg (01/17 1500) SpO2:  [92 %-100 %] 94 % (01/17 1500) General:  Alert and oriented, No acute distress HEENT: Normocephalic, atraumatic Neck: No JVD or lymphadenopathy Cardiovascular: Regular rate and rhythm Lungs: Regular rate and effort Abdomen: Soft, nontender, nondistended, no abdominal masses Back: No CVA tenderness Extremities: Right lower extremity with brace, left lower extremity with TED hose Neurologic: Grossly intact GU: Foley catheter in place, draining well. Urine clear.  Laboratory Data:  Results for orders placed or performed during the hospital encounter of 08/22/14 (from the past 24 hour(s))  Glucose, capillary     Status: None   Collection Time: 08/24/14 11:07 PM  Result Value  Ref Range   Glucose-Capillary 77 70 - 99 mg/dL  Glucose, capillary     Status: None   Collection Time: 08/25/14  2:28 AM  Result Value Ref Range   Glucose-Capillary 70 70 - 99 mg/dL  CBC     Status: Abnormal   Collection Time: 08/25/14  5:35 AM  Result Value Ref Range   WBC 8.3 4.0 - 10.5 K/uL   RBC 3.06 (L) 4.22 - 5.81 MIL/uL   Hemoglobin 9.6 (L) 13.0 - 17.0 g/dL   HCT 28.1 (L) 39.0 - 52.0 %   MCV 91.8 78.0 - 100.0 fL   MCH 31.4 26.0 - 34.0 pg   MCHC 34.2 30.0 - 36.0 g/dL   RDW 14.8 11.5 - 15.5 %   Platelets 142 (L) 150 - 400 K/uL  Basic metabolic panel     Status: Abnormal   Collection Time: 08/25/14  5:35 AM  Result Value Ref Range   Sodium 134 (L) 135 - 145 mmol/L   Potassium 3.7 3.5 - 5.1 mmol/L   Chloride 98 96 - 112 mEq/L   CO2 26 19 - 32 mmol/L   Glucose, Bld 79 70 - 99 mg/dL   BUN 25 (H) 6 - 23 mg/dL   Creatinine, Ser 2.18 (H) 0.50 - 1.35 mg/dL   Calcium 7.9 (L) 8.4 - 10.5 mg/dL   GFR calc non Af Amer 28 (L) >90 mL/min   GFR calc Af Amer 32 (L) >90 mL/min   Anion gap 10 5 - 15  Glucose, capillary     Status: Abnormal   Collection Time: 08/25/14  6:35 AM  Result Value Ref Range   Glucose-Capillary 60 (L) 70 - 99 mg/dL  Glucose, capillary     Status: Abnormal   Collection Time: 08/25/14  6:45 AM  Result Value Ref Range   Glucose-Capillary 58 (L) 70 - 99 mg/dL  Glucose, capillary     Status: Abnormal   Collection Time: 08/25/14  7:10 AM  Result Value Ref Range   Glucose-Capillary 102 (H) 70 - 99 mg/dL  Glucose, capillary  Status: None   Collection Time: 08/25/14 11:26 AM  Result Value Ref Range   Glucose-Capillary 99 70 - 99 mg/dL  Glucose, capillary     Status: Abnormal   Collection Time: 08/25/14  4:07 PM  Result Value Ref Range   Glucose-Capillary 113 (H) 70 - 99 mg/dL   No results found for this or any previous visit (from the past 240 hour(s)). Creatinine:  Recent Labs  08/23/14 0539 08/24/14 2027 08/25/14 0535  CREATININE 1.04 2.81* 2.18*     Impression/Assessment/plan: -Urinary retention-certainly not uncommon after surgery, pain meds, decreased ambulation, constipation, etc. He seems to be improving. I discussed with the patient and his wife attempting a voiding trial/Foley removal tonight with a post void residual checks throughout the morning prior to him going to rehabilitation or simply leaving the Foley catheter in place for a few more days with follow-up in the office for voiding trial. The patient and his wife like to attempt to void trial tonight. Therefore I'll have his nurse remove his catheter about 3 AM and then by the morning will have a better idea of his voiding with plenty of time to make a decision prior to him going to rehabilitation. -fever - follow urine culture . I recommend continuing antibiotics until the urine culture results are known. Also if the Foley catheter is replaced, I recommend a daily (qhs) antibiotic to limit bladder colonization/asymptomatic bacteriuria which would be expected with a Foley catheter. -Acute renal failure-improving. Excellent urine output. No hydronephrosis on ultrasound. 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 Will follow.     I'll notify Dr. Diona Fanti of consult.     Jonathan [name] 08/25/2014, 9:00 PM

## 2014-08-25 NOTE — Progress Notes (Signed)
Patient's CBG 60. Orange juice given.  Order received to give Dextrose 50%- 25 ml and recheck in an hour.

## 2014-08-25 NOTE — Progress Notes (Signed)
Physical Therapy Treatment Patient Details Name: Jonathan [name] MRN: [id number] DOB: [birth year] Today's Date: 08/25/2014    History of Present Illness pt is a 79 y.o. male s/p Rt TKA     PT Comments    Pt progressing with mobility.  Increased ambulation distance.  Cont with current POC.  Per chart review plans are for d/c to [facility] once medically ready.       Follow Up Recommendations  SNF     Equipment Recommendations  None recommended by PT    Recommendations for Other Services       Precautions / Restrictions Precautions Precautions: Knee Precaution Comments: educated that he doesn't need immobilizer on when he is in the bed. Required Braces or Orthoses: Knee Immobilizer - Right Restrictions Weight Bearing Restrictions: Yes RLE Weight Bearing: Weight bearing as tolerated    Mobility  Bed Mobility                  Transfers Overall transfer level: Needs assistance Equipment used: Rolling walker (2 wheeled) Transfers: Sit to/from Stand Sit to Stand: Min guard         General transfer comment: cues to reinforce hand placement  Ambulation/Gait Ambulation/Gait assistance: Min guard Ambulation Distance (Feet): 300 Feet Assistive device: Rolling walker (2 wheeled) Gait Pattern/deviations: Step-through pattern;Decreased stride length;Decreased weight shift to right     General Gait Details: encouragement for increased weightshifting to RLE during stance phase & to slow down LLE step.     Stairs            Wheelchair Mobility    Modified Rankin (Stroke Patients Only)       Balance                                    Cognition Arousal/Alertness: Awake/alert Behavior During Therapy: WFL for tasks assessed/performed Overall Cognitive Status: Within Functional Limits for tasks assessed                      Exercises General Exercises - Lower Extremity Ankle Circles/Pumps: AROM;Both;10 reps Quad Sets:  AROM;Both;10 reps Heel Slides: AAROM;Right;10 reps Straight Leg Raises: AAROM;Right;10 reps    General Comments        Pertinent Vitals/Pain Pain Assessment: No/denies pain    Home Living                      Prior Function            PT Goals (current goals can now be found in the care plan section) Acute Rehab PT Goals PT Goal Formulation: With patient Time For Goal Achievement: 08/27/14 Potential to Achieve Goals: Good Progress towards PT goals: Progressing toward goals    Frequency  7X/week    PT Plan Current plan remains appropriate    Co-evaluation             End of Session Equipment Utilized During Treatment: Right knee immobilizer Activity Tolerance: Patient tolerated treatment well Patient left: in chair;with call bell/phone within reach;with family/visitor present     Time: 0920-0943 PT Time Calculation (min) (ACUTE ONLY): 23 min  Charges:  $Gait Training: 8-22 mins $Therapeutic Exercise: 8-22 mins                    G Codes:      Sena Hitch 08/25/2014, 12:44 PM   Sarajane Marek,  PTA [phone] 08/25/2014

## 2014-08-25 NOTE — Progress Notes (Signed)
Patient Demographics  English Jonathan [name], is a 79 y.o. male, DOB - [birth year], VZC:[id number]  Admit date - 08/22/2014   Admitting Physician Hessie Dibble, MD  Outpatient Primary MD for the patient is TODD,JEFFREY [name], MD  LOS - 3   No chief complaint on file.       Subjective:   Jonathan [name] today has, No headache, No chest pain, No abdominal pain - No Nausea, No new weakness tingling or numbness, No Cough - SOB.    Assessment & Plan    1. Hypotension and low urine output. He is dehydrated also has antihypertensive medications on board, offending medications have been stopped, will hydrate him with IV fluids and maintenance normal saline,  Blood pressure improving.   2. BPH. On 2 alpha blockers, does have evidence of outflow tract obstruction, worsened by narcotics, constipation and lack of activity, bowel regimen initiated,  Foley catheter had to be placed as he failed to pass urine despite to straight cath attempts over 24 hours..   3. DM type II. Hypoglycemic due to acute renal failure, medications adjusted, Lantus along with Glucotrol reduced considerably, sliding-scale reduced, we will monitor.   Recent Labs    Lab Results  Component Value Date   HGBA1C 6.1 12/04/2013      CBG (last 3)   Recent Labs  08/25/14 0635 08/25/14 0645 08/25/14 0710  GLUCAP 60* 58* 102*       4. CAD. No acute issues chest pain free continue present medications unchanged. Nitroglycerin as needed along with low-dose beta blocker from tomorrow.   5. Hypothyroidism. Continue home dose Synthroid.   6. ARF. Due to #1 above. Improving with hydration,  ACE has been stopped, blood pressure improving. Renal ultrasound stable. Has Foley now. Monitor.   7. Overnight of 08/24/2014. Likely due  to urinary retention. UA and chest x-ray clear, I S added, monitor blood cultures. Taper down antibiotics. We'll defer monitoring of postop knee wound to orthopedics which is the primary team.     Code Status: Full  Family Communication: Wife     Medications  Scheduled Meds: . allopurinol  300 mg Oral Daily  . aspirin EC  325 mg Oral BID PC  . bisacodyl  10 mg Rectal Daily  . docusate sodium  200 mg Oral BID  . doxycycline  100 mg Oral Q12H  . insulin aspart  0-9 Units Subcutaneous TID WC  . [START ON 08/26/2014] insulin glargine  20 Units Subcutaneous BID  . levothyroxine  100 mcg Oral QAC breakfast  . mirabegron ER  50 mg Oral QAC breakfast  . niacin  100 mg Oral Daily  . pantoprazole  40 mg Oral Daily  . pioglitazone  30 mg Oral Q breakfast  . piperacillin-tazobactam (ZOSYN)  IV  3.375 g Intravenous Q8H  . polyethylene glycol  17 g Oral BID  . simvastatin  40 mg Oral QHS  . tamsulosin  0.4 mg Oral Daily   Continuous Infusions: . sodium chloride     PRN Meds:.acetaminophen **OR** acetaminophen, alum & mag hydroxide-simeth, HYDROmorphone (DILAUDID) injection, nitroGLYCERIN, ondansetron **OR** ondansetron (ZOFRAN) IV, oxyCODONE, sodium chloride, terazosin  DVT Prophylaxis  ASA  Lab Results  Component Value Date   PLT 142* 08/25/2014  Antibiotics     Anti-infectives    Start     Dose/Rate Route Frequency Ordered Stop   08/26/14 2200  vancomycin (VANCOCIN) 1,500 mg in sodium chloride 0.9 % 500 mL IVPB  Status:  Discontinued     1,500 mg250 mL/hr over 120 Minutes Intravenous Every 48 hours 08/24/14 2209 08/25/14 0625   08/25/14 1000  doxycycline (VIBRA-TABS) tablet 100 mg     100 mg Oral Every 12 hours 08/25/14 0625     08/25/14 0200  piperacillin-tazobactam (ZOSYN) IVPB 3.375 g     3.375 g12.5 mL/hr over 240 Minutes Intravenous Every 8 hours 08/24/14 2209     08/24/14 1930  vancomycin (VANCOCIN) 2,000 mg in sodium chloride 0.9 % 500 mL IVPB     2,000 mg250 mL/hr  over 120 Minutes Intravenous NOW 08/24/14 1918 08/24/14 2340   08/24/14 1930  piperacillin-tazobactam (ZOSYN) IVPB 3.375 g     3.375 g100 mL/hr over 30 Minutes Intravenous NOW 08/24/14 1918 08/24/14 2105   08/22/14 2100  ceFAZolin (ANCEF) IVPB 2 g/50 mL premix     2 g100 mL/hr over 30 Minutes Intravenous Every 6 hours 08/22/14 2056 08/23/14 0520   08/22/14 0600  ceFAZolin (ANCEF) IVPB 2 g/50 mL premix     2 g100 mL/hr over 30 Minutes Intravenous On call to O.R. 08/21/14 1449 08/22/14 1549          Objective:   Filed Vitals:   08/25/14 0000 08/25/14 0400 08/25/14 0641 08/25/14 0800  BP:   115/67   Pulse:   84   Temp:   98 F (36.7 C)   TempSrc:   Oral   Resp: 20 18 16 16   Height:      Weight:      SpO2: 92% 100% 99%     Wt Readings from Last 3 Encounters:  08/22/14 111.131 kg (245 lb)  02/26/14 101.152 kg (223 lb)  02/04/14 106.232 kg (234 lb 3.2 oz)     Intake/Output Summary (Last 24 hours) at 08/25/14 1108 Last data filed at 08/25/14 0800  Gross per 24 hour  Intake    980 ml  Output   1225 ml  Net   -245 ml     Physical Exam  Awake Alert, Oriented X 3, No new F.N deficits, Normal affect [place].AT,PERRAL Supple Neck,No JVD, No cervical lymphadenopathy appriciated.  Symmetrical Chest wall movement, Good air movement bilaterally, CTAB RRR,No Gallops,Rubs or new Murmurs, No Parasternal Heave +ve B.Sounds, Abd Soft, No tenderness, No organomegaly appriciated, No rebound - guarding or rigidity. No Cyanosis, Clubbing or edema, No new Rash or bruise , R Knee in brace, foley   Data Review   Micro Results No results found for this or any previous visit (from the past 240 hour(s)).  Radiology Reports US Renal  08/24/2014   CLINICAL DATA:  Acute renal failure, hypertension, coronary artery disease, type 2 diabetes, history acute glomerulonephritis, hepatitis A and B  EXAM: RENAL/URINARY TRACT ULTRASOUND COMPLETE  COMPARISON:  None  FINDINGS: Right Kidney:  Length: 14.2  cm. Normal cortical thickness and echogenicity. No mass, hydronephrosis or shadowing calcification. No perinephric fluid.  Left Kidney:  Length: 14.9 cm. Cyst at upper pole 5.4 x 4.6 x 4.9 cm, simple features. Additional hyperechoic nodule at mid LEFT kidney medially 1.8 x 1.5 x 1.2 cm without shadowing most consistent with an angiomyolipoma. No hydronephrosis or shadowing calcification.  Bladder:  Decompressed by Foley catheter.  IMPRESSION: No evidence of hydronephrosis.  Cyst at upper pole LEFT  kidney 5.4 cm greatest diameter.  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   Electronically Signed   By: Lavonia Dana M.D.   On: 08/24/2014 20:33   Dg Chest Port 1 View  08/24/2014   CLINICAL DATA:  Fever.  History of CABG, hypertension and diabetes.  EXAM: PORTABLE CHEST - 1 VIEW  COMPARISON:  09/14/2013  FINDINGS: Patient is post median sternotomy and CABG. There is cardiomegaly without appears progressed from prior exam. No confluent airspace disease to suggest pneumonia. There is peribronchial cuffing that may reflect bronchitis. No pleural effusion or pneumothorax. No acute osseous abnormality is seen.  IMPRESSION: 1. Mild cardiomegaly, new from prior exam. 2. Peribronchial cuffing, may reflect bronchitis. There is no evidence of pneumonia.   Electronically Signed   By: Jeb Levering M.D.   On: 08/24/2014 19:07     CBC  Recent Labs Lab 08/23/14 0539 08/24/14 0432 08/25/14 0535  WBC 5.9 9.5 8.3  HGB 12.4* 11.3* 9.6*  HCT 36.1* 33.6* 28.1*  PLT 156 171 142*  MCV 93.5 94.6 91.8  MCH 32.1 31.8 31.4  MCHC 34.3 33.6 34.2  RDW 14.3 14.8 14.8    Chemistries   Recent Labs Lab 08/22/14 1306 08/23/14 0539 08/24/14 2027 08/25/14 0535  NA  --  137 132* 134*  K  --  4.0 3.6 3.7  CL  --  102 98 98  CO2  --  26 28 26   GLUCOSE  --  185* 67* 79  BUN  --  12 28* 25*  CREATININE  --  1.04 2.81* 2.18*  CALCIUM  --  8.4 8.0* 7.9*  AST 31  --   --   --    ALT 20  --   --   --   ALKPHOS 52  --   --   --   BILITOT 0.7  --   --   --    ------------------------------------------------------------------------------------------------------------------ estimated creatinine clearance is 38.5 mL/min (by C-G formula based on Cr of 2.18). ------------------------------------------------------------------------------------------------------------------ No results for input(s): HGBA1C in the last 72 hours. ------------------------------------------------------------------------------------------------------------------ No results for input(s): CHOL, HDL, LDLCALC, TRIG, CHOLHDL, LDLDIRECT in the last 72 hours. ------------------------------------------------------------------------------------------------------------------ No results for input(s): TSH, T4TOTAL, T3FREE, THYROIDAB in the last 72 hours.  Invalid input(s): FREET3 ------------------------------------------------------------------------------------------------------------------ No results for input(s): VITAMINB12, FOLATE, FERRITIN, TIBC, IRON, RETICCTPCT in the last 72 hours.  Coagulation profile No results for input(s): INR, PROTIME in the last 168 hours.  No results for input(s): DDIMER in the last 72 hours.  Cardiac Enzymes No results for input(s): CKMB, TROPONINI, MYOGLOBIN in the last 168 hours.  Invalid input(s): CK ------------------------------------------------------------------------------------------------------------------ Invalid input(s): POCBNP     Time Spent in minutes   35   Jonathan [name] K M.D on 08/25/2014 at 11:08 AM  Between 7am to 7pm - Pager - [phone]  After 7pm go to [email or web address] - [facility] Office  [phone]

## 2014-08-25 NOTE — Progress Notes (Signed)
    Patient doing well from standpoint of R TKR per Dr Rhona Raider. No pain about knee. Med team following for potential UTI, hypotension, DM II and BPH, ARF. Currently on IV ABX pending culture results. Pt reports feeling better today. Making gains in PT/OT, admits to not using CPM over past 24hrs but ROM good.   Physical Exam: BP 115/67 mmHg  Pulse 84  Temp(Src) 98 F (36.7 C) (Oral)  Resp 16  Ht 6' 2.5" (1.892 m)  Wt 111.131 kg (245 lb)  BMI 31.05 kg/m2  SpO2 99%  Pt laying comfortably in hospital bed, Dressing in place, distal compartments soft, NVI  POD #3 s/p R TKR per Dr Rhona Raider doing well  - up with PT/OT, encourage ambulation, cont CPM - ASA 325 BID - Cont current px regimen - likely d/c to [facility] today vs tomorrow pending medicine team  - OK to D/C from orthopaedic standpoint  - Pt reportedly has bed already at [facility] - Pending cultures to transition to oral ABX per Med team

## 2014-08-25 NOTE — Clinical Social Work Note (Signed)
CSW spoke with patient's RN Joy, who reports patient not ready for d/c on this date. CSW contacted [facility] and made RN Supervisor [place] aware. CSW to follow tomorrow.  [place], [place] Weekend Clinical Social Worker [phone]

## 2014-08-25 NOTE — Progress Notes (Signed)
On- call coverage for 5N floor notified of patient's renal ultra sound and chest X-ray results.

## 2014-08-26 DIAGNOSIS — M6281 Muscle weakness (generalized): Secondary | ICD-10-CM | POA: Diagnosis not present

## 2014-08-26 DIAGNOSIS — E119 Type 2 diabetes mellitus without complications: Secondary | ICD-10-CM | POA: Diagnosis not present

## 2014-08-26 DIAGNOSIS — I10 Essential (primary) hypertension: Secondary | ICD-10-CM | POA: Diagnosis not present

## 2014-08-26 DIAGNOSIS — Z471 Aftercare following joint replacement surgery: Secondary | ICD-10-CM | POA: Diagnosis not present

## 2014-08-26 DIAGNOSIS — K5900 Constipation, unspecified: Secondary | ICD-10-CM | POA: Diagnosis not present

## 2014-08-26 DIAGNOSIS — E039 Hypothyroidism, unspecified: Secondary | ICD-10-CM | POA: Diagnosis not present

## 2014-08-26 DIAGNOSIS — E785 Hyperlipidemia, unspecified: Secondary | ICD-10-CM | POA: Diagnosis not present

## 2014-08-26 DIAGNOSIS — R609 Edema, unspecified: Secondary | ICD-10-CM | POA: Diagnosis not present

## 2014-08-26 DIAGNOSIS — M25561 Pain in right knee: Secondary | ICD-10-CM | POA: Diagnosis not present

## 2014-08-26 DIAGNOSIS — N3000 Acute cystitis without hematuria: Secondary | ICD-10-CM | POA: Diagnosis not present

## 2014-08-26 DIAGNOSIS — S83104A Unspecified dislocation of right knee, initial encounter: Secondary | ICD-10-CM | POA: Diagnosis not present

## 2014-08-26 DIAGNOSIS — R2681 Unsteadiness on feet: Secondary | ICD-10-CM | POA: Diagnosis not present

## 2014-08-26 DIAGNOSIS — N390 Urinary tract infection, site not specified: Secondary | ICD-10-CM | POA: Diagnosis not present

## 2014-08-26 DIAGNOSIS — R32 Unspecified urinary incontinence: Secondary | ICD-10-CM | POA: Diagnosis not present

## 2014-08-26 DIAGNOSIS — Z96651 Presence of right artificial knee joint: Secondary | ICD-10-CM | POA: Diagnosis not present

## 2014-08-26 DIAGNOSIS — R278 Other lack of coordination: Secondary | ICD-10-CM | POA: Diagnosis not present

## 2014-08-26 DIAGNOSIS — E118 Type 2 diabetes mellitus with unspecified complications: Secondary | ICD-10-CM | POA: Diagnosis not present

## 2014-08-26 DIAGNOSIS — M109 Gout, unspecified: Secondary | ICD-10-CM | POA: Diagnosis not present

## 2014-08-26 DIAGNOSIS — I2510 Atherosclerotic heart disease of native coronary artery without angina pectoris: Secondary | ICD-10-CM | POA: Diagnosis not present

## 2014-08-26 DIAGNOSIS — K219 Gastro-esophageal reflux disease without esophagitis: Secondary | ICD-10-CM | POA: Diagnosis not present

## 2014-08-26 DIAGNOSIS — N400 Enlarged prostate without lower urinary tract symptoms: Secondary | ICD-10-CM | POA: Diagnosis not present

## 2014-08-26 DIAGNOSIS — M1711 Unilateral primary osteoarthritis, right knee: Secondary | ICD-10-CM | POA: Diagnosis not present

## 2014-08-26 LAB — URINE CULTURE
Colony Count: NO GROWTH
Culture: NO GROWTH

## 2014-08-26 LAB — BASIC METABOLIC PANEL
ANION GAP: 5 (ref 5–15)
BUN: 13 mg/dL (ref 6–23)
CO2: 32 mmol/L (ref 19–32)
Calcium: 8.2 mg/dL — ABNORMAL LOW (ref 8.4–10.5)
Chloride: 98 mEq/L (ref 96–112)
Creatinine, Ser: 1.28 mg/dL (ref 0.50–1.35)
GFR calc Af Amer: 61 mL/min — ABNORMAL LOW (ref >90)
GFR, EST NON AFRICAN AMERICAN: 53 mL/min — AB (ref >90)
Glucose, Bld: 131 mg/dL — ABNORMAL HIGH (ref 70–99)
Potassium: 3.4 mmol/L — ABNORMAL LOW (ref 3.5–5.1)
Sodium: 135 mmol/L (ref 135–145)

## 2014-08-26 LAB — GLUCOSE, CAPILLARY
Glucose-Capillary: 114 mg/dL — ABNORMAL HIGH (ref 70–99)
Glucose-Capillary: 205 mg/dL — ABNORMAL HIGH (ref 70–99)

## 2014-08-26 MED ORDER — POTASSIUM CHLORIDE CRYS ER 20 MEQ PO TBCR
40.0000 meq | EXTENDED_RELEASE_TABLET | Freq: Once | ORAL | Status: AC
  Administered 2014-08-26: 40 meq via ORAL
  Filled 2014-08-26: qty 2

## 2014-08-26 MED ORDER — LEVOFLOXACIN 500 MG PO TABS: 500.0000 mg | ORAL_TABLET | Freq: Every day | ORAL | Status: DC

## 2014-08-26 MED ORDER — DOXYCYCLINE HYCLATE 100 MG PO TABS: 100.0000 mg | ORAL_TABLET | Freq: Two times a day (BID) | ORAL | Status: DC

## 2014-08-26 MED ORDER — FUROSEMIDE 20 MG PO TABS: 20.0000 mg | ORAL_TABLET | Freq: Every day | ORAL | Status: DC

## 2014-08-26 NOTE — Progress Notes (Signed)
Physical Therapy Treatment Patient Details Name: Jonathan [name] MRN: [id number] DOB: 21-Dec-1935 Today's Date: 08/26/2014    History of Present Illness pt is a 79 y.o. male s/p Rt TKA     PT Comments    Pt with improved mobility but con't to have weak R LE and decreased R knee ROM. Pt to con't to benefit from ST-SNF upon d/c for maximal functional recovery.   Follow Up Recommendations  SNF     Equipment Recommendations  None recommended by PT    Recommendations for Other Services       Precautions / Restrictions Precautions Precautions: Knee Restrictions Weight Bearing Restrictions: Yes RLE Weight Bearing: Weight bearing as tolerated    Mobility  Bed Mobility Overal bed mobility: Needs Assistance Bed Mobility: Supine to Sit     Supine to sit: Supervision     General bed mobility comments: Pt relying on trapeze bar initially to bring trunk to sitting upright. Encouraged pt to not use due to he will not have one at home.    Transfers Overall transfer level: Needs assistance Equipment used: Rolling walker (2 wheeled) Transfers: Sit to/from Stand Sit to Stand: Min guard         General transfer comment: cues to reinforce hand placement  Ambulation/Gait Ambulation/Gait assistance: Min guard Ambulation Distance (Feet): 600 Feet Assistive device: Rolling walker (2 wheeled) Gait Pattern/deviations: Step-through pattern Gait velocity: decreased   General Gait Details: v/c's to decreased bilat UE support and increased R LE WBing, encouraged increased R knee extension during stance phase   Stairs            Wheelchair Mobility    Modified Rankin (Stroke Patients Only)       Balance           Standing balance support: During functional activity Standing balance-Leahy Scale: Poor Standing balance comment: RW to balance                    Cognition Arousal/Alertness: Awake/alert Behavior During Therapy: WFL for tasks  assessed/performed Overall Cognitive Status: Within Functional Limits for tasks assessed                      Exercises Total Joint Exercises Ankle Circles/Pumps: AROM;Both;10 reps Quad Sets: AROM;Right;10 reps Heel Slides: AROM;Right;10 reps;Seated Hip ABduction/ADduction: AROM;Right;10 reps (with quad set) Straight Leg Raises: AROM;Right;10 reps    General Comments        Pertinent Vitals/Pain Pain Assessment: 0-10 Pain Score: 3  Pain Location: R knee Pain Descriptors / Indicators: Sore Pain Intervention(s): Monitored during session;RN gave pain meds during session    Home Living                      Prior Function            PT Goals (current goals can now be found in the care plan section) Progress towards PT goals: Progressing toward goals    Frequency  7X/week    PT Plan Current plan remains appropriate    Co-evaluation             End of Session Equipment Utilized During Treatment: Gait belt Activity Tolerance: Patient tolerated treatment well Patient left: in chair;with call bell/phone within reach;with family/visitor present;with nursing/sitter in room     Time: 1540-0867 PT Time Calculation (min) (ACUTE ONLY): 33 min  Charges:  $Gait Training: 8-22 mins $Therapeutic Exercise: 8-22 mins  G Codes:      Kingsley Callander 08/26/2014, 9:00 AM   Kittie Plater, PT, DPT Pager #: [phone] Office #: [phone]

## 2014-08-26 NOTE — Progress Notes (Signed)
Subjective: 4 Days Post-Op Procedure(s) (LRB): RIGHT TOTAL KNEE ARTHROPLASTY (Right)  Activity level:  wbat Diet tolerance: eating well. Voiding:Foley removed this morning at 3:00am  and patient is urinating on his own Patient reports pain as mild.    Objective: Vital signs in last 24 hours: Temp:  [98.1 F (36.7 C)-99 F (37.2 C)] 98.1 F (36.7 C) (01/18 0559) Pulse Rate:  [76-89] 76 (01/18 0559) Resp:  [16-18] 18 (01/18 0559) BP: (99-124)/(51-58) 124/56 mmHg (01/18 0559) SpO2:  [94 %-100 %] 100 % (01/18 0559)  Labs:  Recent Labs  08/24/14 0432 08/25/14 0535  HGB 11.3* 9.6*    Recent Labs  08/24/14 0432 08/25/14 0535  WBC 9.5 8.3  RBC 3.55* 3.06*  HCT 33.6* 28.1*  PLT 171 142*    Recent Labs  08/25/14 0535 08/26/14 0525  NA 134* 135  K 3.7 3.4*  CL 98 98  CO2 26 32  BUN 25* 13  CREATININE 2.18* 1.28  GLUCOSE 79 131*  CALCIUM 7.9* 8.2*   No results for input(s): LABPT, INR in the last 72 hours.  Physical Exam:  Neurologically intact ABD soft Neurovascular intact Sensation intact distally Intact pulses distally Dorsiflexion/Plantar flexion intact Incision: dressing C/D/I No cellulitis present Compartment soft  Assessment/Plan:  4 Days Post-Op Procedure(s) (LRB): RIGHT TOTAL KNEE ARTHROPLASTY (Right) Advance diet Up with therapy Discharge to SNF  [facility] today.  Appreciate medicine and Urology consult. Patient is voiding on his own now so he will go to SNF today without Foley if ok by Urology. We will continue him on oral abx until all cultures come back final and negative per medicine consult.  Continue on ASA 325mg  BID x 2 weeks for DVT prevention.  Follow up in office 2 weeks post op.    [place], [name] 08/26/2014, 9:15 AM

## 2014-08-26 NOTE — Progress Notes (Signed)
At 0310 foley was removed, warm pack was placed on his lower abd, urinal placed in reach.  Will continue to monitor for independent urine output.

## 2014-08-26 NOTE — Progress Notes (Signed)
At 0600, patient had urine incontinent episode, he soaked a towel he had between his legs with urine.  Patient stated that he uses briefs at home.

## 2014-08-26 NOTE — Discharge Summary (Signed)
Patient ID: Jonathan [name] MRN: [id number] DOB/AGE: [birth year] 79 y.o.  Admit date: 08/22/2014 Discharge date: 08/26/2014  Admission Diagnoses:  Principal Problem:   Primary localized osteoarthritis of right knee Active Problems:   Hypothyroidism   Essential hypertension   Coronary atherosclerosis   Diabetes   Primary osteoarthritis of right knee   Discharge Diagnoses:  Same  Past Medical History  Diagnosis Date  . CAD (coronary artery disease)     CABG 2005 by Dr Cyndia Bent  . Hyperlipidemia   . Hypertension   . Hypothyroidism   . Sun-damaged skin   . Benign prostatic hypertrophy   . Gout   . DJD (degenerative joint disease)     bilateral knee, gout  . Hepatitis A 1950  . Hepatitis B 1980's  . Type II diabetes mellitus   . Sleep apnea 1990's    "before uvulectomy"  . GERD (gastroesophageal reflux disease)   . Squamous cell carcinoma     "lots on my arms & face" (09/21/2013)  . Personal history of other infectious and parasitic disease   . Knee joint replacement by other means   . Glomerulonephritis acute     at age 83, due to beta strep, resolved, renal calculi- passed spontaneously  . HOH (hard of hearing)     wears hearing aids  . Incontinence of urine     Surgeries: Procedure(s): RIGHT TOTAL KNEE ARTHROPLASTY on 08/22/2014   Consultants: Treatment Team:  [name], MD  Discharged Condition: Improved  Hospital Course: Jonathan [name] is an 79 y.o. male who was admitted 08/22/2014 for operative treatment ofPrimary localized osteoarthritis of right knee. Patient has severe unremitting pain that affects sleep, daily activities, and work/hobbies. After pre-op clearance the patient was taken to the operating room on 08/22/2014 and underwent  Procedure(s): RIGHT TOTAL KNEE ARTHROPLASTY.    Patient was given perioperative antibiotics: Anti-infectives    Start     Dose/Rate Route Frequency Ordered Stop   08/26/14 2200  vancomycin (VANCOCIN) 1,500 mg in  sodium chloride 0.9 % 500 mL IVPB  Status:  Discontinued     1,500 mg250 mL/hr over 120 Minutes Intravenous Every 48 hours 08/24/14 2209 08/25/14 0625   08/26/14 0000  doxycycline (VIBRA-TABS) 100 MG tablet  Status:  Discontinued     100 mg Oral Every 12 hours 08/26/14 0805 08/26/14    08/26/14 0000  levofloxacin (LEVAQUIN) 500 MG tablet     500 mg Oral Daily 08/26/14 0807     08/25/14 1000  doxycycline (VIBRA-TABS) tablet 100 mg     100 mg Oral Every 12 hours 08/25/14 0625     08/25/14 0200  piperacillin-tazobactam (ZOSYN) IVPB 3.375 g     3.375 g12.5 mL/hr over 240 Minutes Intravenous Every 8 hours 08/24/14 2209     08/24/14 1930  vancomycin (VANCOCIN) 2,000 mg in sodium chloride 0.9 % 500 mL IVPB     2,000 mg250 mL/hr over 120 Minutes Intravenous NOW 08/24/14 1918 08/24/14 2340   08/24/14 1930  piperacillin-tazobactam (ZOSYN) IVPB 3.375 g     3.375 g100 mL/hr over 30 Minutes Intravenous NOW 08/24/14 1918 08/24/14 2105   08/22/14 2100  ceFAZolin (ANCEF) IVPB 2 g/50 mL premix     2 g100 mL/hr over 30 Minutes Intravenous Every 6 hours 08/22/14 2056 08/23/14 0520   08/22/14 0600  ceFAZolin (ANCEF) IVPB 2 g/50 mL premix     2 g100 mL/hr over 30 Minutes Intravenous On call to O.R. 08/21/14 1449 08/22/14 1549  Patient was given sequential compression devices, early ambulation, and chemoprophylaxis to prevent DVT.  Patient had problems with urinary retention and hypotension. Medicine and urology consults were placed. A foley was placed in patient due to urinary retention. This was removed prior to him being discharged from hospital as he was urinating on his own. Patient was treated for probable UTI and is doing very well at this time.   Patient benefited maximally from hospital stay and there were no complications.    Recent vital signs: Patient Vitals for the past 24 hrs:  BP Temp Temp src Pulse Resp SpO2  08/26/14 0559 (!) 124/56 mmHg 98.1 F (36.7 C) Oral 76 18 100 %  08/26/14 0000  - - - - 18 94 %  08/25/14 2121 (!) 117/51 mmHg 98.8 F (37.1 C) Oral 89 18 94 %  08/25/14 2000 - - - - 18 94 %  08/25/14 1600 - - - - 16 -  08/25/14 1500 (!) 99/58 mmHg 99 F (37.2 C) Oral 84 16 94 %  08/25/14 1157 - - - - 16 -     Recent laboratory studies:  Recent Labs  08/24/14 0432  08/25/14 0535 08/26/14 0525  WBC 9.5  --  8.3  --   HGB 11.3*  --  9.6*  --   HCT 33.6*  --  28.1*  --   PLT 171  --  142*  --   NA  --   < > 134* 135  K  --   < > 3.7 3.4*  CL  --   < > 98 98  CO2  --   < > 26 32  BUN  --   < > 25* 13  CREATININE  --   < > 2.18* 1.28  GLUCOSE  --   < > 79 131*  CALCIUM  --   < > 7.9* 8.2*  < > = values in this interval not displayed.   Discharge Medications:     Medication List    STOP taking these medications        benazepril-hydrochlorthiazide 20-25 MG per tablet  Commonly known as:  LOTENSIN HCT     lisinopril-hydrochlorothiazide 20-25 MG per tablet  Commonly known as:  PRINZIDE,ZESTORETIC     metFORMIN 1000 MG tablet  Commonly known as:  GLUCOPHAGE      TAKE these medications        allopurinol 300 MG tablet  Commonly known as:  ZYLOPRIM  TAKE 1 TABLET EVERY DAY     aspirin 325 MG EC tablet  Take 1 tablet (325 mg total) by mouth 2 (two) times daily.     cetirizine 10 MG tablet  Commonly known as:  ZYRTEC  Take 10 mg by mouth daily as needed for allergies.     Fish Oil 1000 MG Caps  Take 1,000 mg by mouth 2 (two) times daily.     furosemide 20 MG tablet  Commonly known as:  LASIX  Take 20 mg by mouth daily as needed for fluid or edema.     furosemide 20 MG tablet  Commonly known as:  LASIX  Take 1 tablet (20 mg total) by mouth daily.  Start taking on:  08/27/2014     glipiZIDE 10 MG tablet  Commonly known as:  GLUCOTROL  Take 1 tablet (10 mg total) by mouth 2 (two) times daily.     insulin glargine 100 UNIT/ML injection  Commonly known as:  LANTUS  Inject 0.3 mLs (  30 Units total) into the skin at bedtime.      levofloxacin 500 MG tablet  Commonly known as:  LEVAQUIN  Take 1 tablet (500 mg total) by mouth daily. 3 more days     levothyroxine 100 MCG tablet  Commonly known as:  SYNTHROID, LEVOTHROID  Take 1 tablet (100 mcg total) by mouth every morning.     methocarbamol 500 MG tablet  Commonly known as:  ROBAXIN  Take 1 tablet (500 mg total) by mouth every 6 (six) hours as needed for muscle spasms.     metoprolol succinate 25 MG 24 hr tablet  Commonly known as:  TOPROL-XL  Take 0.5 tablets (12.5 mg total) by mouth daily.     MULTIVITAMIN PO  Take 1 tablet by mouth daily.     MYRBETRIQ 50 MG Tb24 tablet  Generic drug:  mirabegron ER  Take 50 mg by mouth daily before breakfast.     niacin 100 MG tablet  Take 100 mg by mouth daily.     nitroGLYCERIN 0.4 MG SL tablet  Commonly known as:  NITROSTAT  Place 1 tablet (0.4 mg total) under the tongue as needed.     omeprazole 20 MG capsule  Commonly known as:  PRILOSEC  Take 1 capsule (20 mg total) by mouth 2 (two) times daily before a meal.     oxyCODONE 5 MG immediate release tablet  Commonly known as:  Oxy IR/ROXICODONE  Take 1-2 tablets (5-10 mg total) by mouth every 4 (four) hours as needed for breakthrough pain.     pioglitazone 45 MG tablet  Commonly known as:  ACTOS  Take 1 tablet (45 mg total) by mouth daily.     simvastatin 40 MG tablet  Commonly known as:  ZOCOR  Take 1 tablet (40 mg total) by mouth at bedtime.     tamsulosin 0.4 MG Caps capsule  Commonly known as:  FLOMAX  Take 1 capsule (0.4 mg total) by mouth daily.     terazosin 1 MG capsule  Commonly known as:  HYTRIN  Take 1 capsule (1 mg total) by mouth at bedtime as needed (overactive bladder).        Diagnostic Studies: US Renal  08/24/2014   CLINICAL DATA:  Acute renal failure, hypertension, coronary artery disease, type 2 diabetes, history acute glomerulonephritis, hepatitis A and B  EXAM: RENAL/URINARY TRACT ULTRASOUND COMPLETE  COMPARISON:  None   FINDINGS: Right Kidney:  Length: 14.2 cm. Normal cortical thickness and echogenicity. No mass, hydronephrosis or shadowing calcification. No perinephric fluid.  Left Kidney:  Length: 14.9 cm. Cyst at upper pole 5.4 x 4.6 x 4.9 cm, simple features. Additional hyperechoic nodule at mid LEFT kidney medially 1.8 x 1.5 x 1.2 cm without shadowing most consistent with an angiomyolipoma. No hydronephrosis or shadowing calcification.  Bladder:  Decompressed by Foley catheter.  IMPRESSION: No evidence of hydronephrosis.  Cyst at upper pole LEFT kidney 5.4 cm greatest diameter.  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   Electronically Signed   By: Lavonia Dana M.D.   On: 08/24/2014 20:33   Dg Chest Port 1 View  08/24/2014   CLINICAL DATA:  Fever.  History of CABG, hypertension and diabetes.  EXAM: PORTABLE CHEST - 1 VIEW  COMPARISON:  09/14/2013  FINDINGS: Patient is post median sternotomy and CABG. There is cardiomegaly without appears progressed from prior exam. No confluent airspace disease to suggest pneumonia. There is peribronchial cuffing that may reflect bronchitis.  No pleural effusion or pneumothorax. No acute osseous abnormality is seen.  IMPRESSION: 1. Mild cardiomegaly, new from prior exam. 2. Peribronchial cuffing, may reflect bronchitis. There is no evidence of pneumonia.   Electronically Signed   By: Jeb Levering M.D.   On: 08/24/2014 19:07    Disposition: 03-Skilled Nursing Facility      Discharge Instructions    Call MD / Call 911    Complete by:  As directed   If you experience chest pain or shortness of breath, CALL 911 and be transported to the hospital emergency room.  If you develope a fever above 101 F, pus (white drainage) or increased drainage or redness at the wound, or calf pain, call your surgeon's office.     Constipation Prevention    Complete by:  As directed   Drink plenty of fluids.  Prune juice may be helpful.  You may use a  stool softener, such as Colace (over the counter) 100 mg twice a day.  Use MiraLax (over the counter) for constipation as needed.     Diet - low sodium heart healthy    Complete by:  As directed      Increase activity slowly as tolerated    Complete by:  As directed            Follow-up Information    Follow up with Hessie Dibble, MD. Schedule an appointment as soon as possible for a visit in 2 weeks.   Specialty:  Orthopedic Surgery   Contact information:   [place] [id number] [phone]        Signed: Rich Fuchs 08/26/2014, 9:27 AM

## 2014-08-26 NOTE — Progress Notes (Signed)
Patient Demographics  Jonathan [name], is a 79 y.o. male, DOB - 1936/03/22, SWF:[id number]  Admit date - 08/22/2014   Admitting Physician Jonathan Dibble, MD  Outpatient Primary MD for the patient is Jonathan [name], MD  LOS - 4   No chief complaint on file.       Subjective:   Jonathan [name] today has, No headache, No chest pain, No abdominal pain - No Nausea, No new weakness tingling or numbness, No Cough - SOB.    Assessment & Plan    1. Hypotension and low urine output. Secondary to dehydration. Resolved after aggressive hydration.   2. BPH. On 2 alpha blockers, does have evidence of outflow tract obstruction, worsened by narcotics, constipation and lack of activity, bowel regimen initiated,  Foley catheter had to be placed as he failed to pass urine despite to straight cath attempts over 24 hours. He was subsequently seen by urologist and Foley was removed early morning 08/26/2014 continue to monitor postvoid residuals, urologist was Jonathan [name].   3. DM type II. Sugar stable. Glucophage discontinued. Home regimen resumed. Check CBGs before every meal and HS. and adjust insulin dose as needed.   Recent Labs    Lab Results  Component Value Date   HGBA1C 6.1 12/04/2013      CBG (last 3)   Recent Labs  08/25/14 1607 08/25/14 2130 08/26/14 0643  GLUCAP 113* 180* 114*       4. CAD. No acute issues chest pain free continue present medications unchanged. Nitroglycerin as needed along with low-dose beta blocker from tomorrow.   5. Hypothyroidism. Continue home dose Synthroid.   6. ARF. Due to #1 above. Improved with hydration. Resume Ace and diuretic combination from 08/27/2014 monitor BMP.   7. Low-grade fever Overnight of 08/24/2014. Likely due to  urinary retention. UA and chest x-ray clear, I S added, monitor blood cultures. Final urine culture is negative. No further fevers, nontoxic appearance, will place on 3 more days of oral Levaquin for possible URI. Follow final blood culture results.     Code Status: Full  Family Communication: Wife     Medications  Scheduled Meds: . allopurinol  300 mg Oral Daily  . aspirin EC  325 mg Oral BID PC  . bisacodyl  10 mg Rectal Daily  . docusate sodium  200 mg Oral BID  . doxycycline  100 mg Oral Q12H  . insulin aspart  0-9 Units Subcutaneous TID WC  . insulin glargine  20 Units Subcutaneous BID  . levothyroxine  100 mcg Oral QAC breakfast  . mirabegron ER  50 mg Oral QAC breakfast  . niacin  100 mg Oral Daily  . pantoprazole  40 mg Oral Daily  . pioglitazone  30 mg Oral Q breakfast  . piperacillin-tazobactam (ZOSYN)  IV  3.375 g Intravenous Q8H  . polyethylene glycol  17 g Oral BID  . simvastatin  40 mg Oral QHS  . tamsulosin  0.4 mg Oral Daily   Continuous Infusions:   PRN Meds:.acetaminophen **OR** acetaminophen, alum & mag hydroxide-simeth, nitroGLYCERIN, ondansetron **OR** ondansetron (ZOFRAN) IV, oxyCODONE, sodium chloride, terazosin  DVT Prophylaxis  ASA  Lab Results  Component Value Date   PLT 142* 08/25/2014    Antibiotics  Anti-infectives    Start     Dose/Rate Route Frequency Ordered Stop   08/26/14 2200  vancomycin (VANCOCIN) 1,500 mg in sodium chloride 0.9 % 500 mL IVPB  Status:  Discontinued     1,500 mg250 mL/hr over 120 Minutes Intravenous Every 48 hours 08/24/14 2209 08/25/14 0625   08/26/14 0000  doxycycline (VIBRA-TABS) 100 MG tablet  Status:  Discontinued     100 mg Oral Every 12 hours 08/26/14 0805 08/26/14    08/26/14 0000  levofloxacin (LEVAQUIN) 500 MG tablet     500 mg Oral Daily 08/26/14 0807     08/25/14 1000  doxycycline (VIBRA-TABS) tablet 100 mg     100 mg Oral Every 12 hours 08/25/14 0625     08/25/14 0200  piperacillin-tazobactam  (ZOSYN) IVPB 3.375 g     3.375 g12.5 mL/hr over 240 Minutes Intravenous Every 8 hours 08/24/14 2209     08/24/14 1930  vancomycin (VANCOCIN) 2,000 mg in sodium chloride 0.9 % 500 mL IVPB     2,000 mg250 mL/hr over 120 Minutes Intravenous NOW 08/24/14 1918 08/24/14 2340   08/24/14 1930  piperacillin-tazobactam (ZOSYN) IVPB 3.375 g     3.375 g100 mL/hr over 30 Minutes Intravenous NOW 08/24/14 1918 08/24/14 2105   08/22/14 2100  ceFAZolin (ANCEF) IVPB 2 g/50 mL premix     2 g100 mL/hr over 30 Minutes Intravenous Every 6 hours 08/22/14 2056 08/23/14 0520   08/22/14 0600  ceFAZolin (ANCEF) IVPB 2 g/50 mL premix     2 g100 mL/hr over 30 Minutes Intravenous On call to O.R. 08/21/14 1449 08/22/14 1549          Objective:   Filed Vitals:   08/25/14 2000 08/25/14 2121 08/26/14 0000 08/26/14 0559  BP:  117/51  124/56  Pulse:  89  76  Temp:  98.8 F (37.1 C)  98.1 F (36.7 C)  TempSrc:  Oral  Oral  Resp: 18 18 18 18   Height:      Weight:      SpO2: 94% 94% 94% 100%    Wt Readings from Last 3 Encounters:  08/22/14 111.131 kg (245 lb)  02/26/14 101.152 kg (223 lb)  02/04/14 106.232 kg (234 lb 3.2 oz)     Intake/Output Summary (Last 24 hours) at 08/26/14 0942 Last data filed at 08/26/14 1517  Gross per 24 hour  Intake    240 ml  Output   3275 ml  Net  -3035 ml     Physical Exam  Awake Alert, Oriented X 3, No new F.N deficits, Normal affect [place].AT,PERRAL Supple Neck,No JVD, No cervical lymphadenopathy appriciated.  Symmetrical Chest wall movement, Good air movement bilaterally, CTAB RRR,No Gallops,Rubs or new Murmurs, No Parasternal Heave +ve B.Sounds, Abd Soft, No tenderness, No organomegaly appriciated, No rebound - guarding or rigidity. No Cyanosis, Clubbing or edema, No new Rash or bruise , R Knee in brace, foley   Data Review   Micro Results Recent Results (from the past 240 hour(s))  Urine culture     Status: None   Collection Time: 08/24/14  6:44 PM  Result  Value Ref Range Status   Specimen Description URINE, CATHETERIZED  Final   Special Requests NONE  Final   Colony Count NO GROWTH Performed at [facility]   Final   Culture NO GROWTH Performed at [facility]   Final   Report Status 08/26/2014 FINAL  Final  Culture, blood (routine x 2)     Status: None (  Preliminary result)   Collection Time: 08/24/14  8:15 PM  Result Value Ref Range Status   Specimen Description BLOOD RIGHT ANTECUBITAL  Final   Special Requests BOTTLES DRAWN AEROBIC ONLY 10CC  Final   Culture   Final           BLOOD CULTURE RECEIVED NO GROWTH TO DATE CULTURE WILL BE HELD FOR 5 DAYS BEFORE ISSUING A FINAL NEGATIVE REPORT Note: Culture results may be compromised due to an excessive volume of blood received in culture bottles. Performed at [facility]    Report Status PENDING  Incomplete  Culture, blood (routine x 2)     Status: None (Preliminary result)   Collection Time: 08/24/14  8:27 PM  Result Value Ref Range Status   Specimen Description BLOOD RIGHT HAND  Final   Special Requests   Final    BOTTLES DRAWN AEROBIC AND ANAEROBIC 10CC BLUE [place] RED   Culture   Final           BLOOD CULTURE RECEIVED NO GROWTH TO DATE CULTURE WILL BE HELD FOR 5 DAYS BEFORE ISSUING A FINAL NEGATIVE REPORT Performed at [facility]    Report Status PENDING  Incomplete    Radiology Reports US Renal  08/24/2014   CLINICAL DATA:  Acute renal failure, hypertension, coronary artery disease, type 2 diabetes, history acute glomerulonephritis, hepatitis A and B  EXAM: RENAL/URINARY TRACT ULTRASOUND COMPLETE  COMPARISON:  None  FINDINGS: Right Kidney:  Length: 14.2 cm. Normal cortical thickness and echogenicity. No mass, hydronephrosis or shadowing calcification. No perinephric fluid.  Left Kidney:  Length: 14.9 cm. Cyst at upper pole 5.4 x 4.6 x 4.9 cm, simple features. Additional hyperechoic nodule at mid LEFT kidney medially 1.8 x 1.5 x 1.2 cm without  shadowing most consistent with an angiomyolipoma. No hydronephrosis or shadowing calcification.  Bladder:  Decompressed by Foley catheter.  IMPRESSION: No evidence of hydronephrosis.  Cyst at upper pole LEFT kidney 5.4 cm greatest diameter.  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   Electronically Signed   By: Lavonia Dana M.D.   On: 08/24/2014 20:33   Dg Chest Port 1 View  08/24/2014   CLINICAL DATA:  Fever.  History of CABG, hypertension and diabetes.  EXAM: PORTABLE CHEST - 1 VIEW  COMPARISON:  09/14/2013  FINDINGS: Patient is post median sternotomy and CABG. There is cardiomegaly without appears progressed from prior exam. No confluent airspace disease to suggest pneumonia. There is peribronchial cuffing that may reflect bronchitis. No pleural effusion or pneumothorax. No acute osseous abnormality is seen.  IMPRESSION: 1. Mild cardiomegaly, new from prior exam. 2. Peribronchial cuffing, may reflect bronchitis. There is no evidence of pneumonia.   Electronically Signed   By: Jeb Levering M.D.   On: 08/24/2014 19:07     CBC  Recent Labs Lab 08/23/14 0539 08/24/14 0432 08/25/14 0535  WBC 5.9 9.5 8.3  HGB 12.4* 11.3* 9.6*  HCT 36.1* 33.6* 28.1*  PLT 156 171 142*  MCV 93.5 94.6 91.8  MCH 32.1 31.8 31.4  MCHC 34.3 33.6 34.2  RDW 14.3 14.8 14.8    Chemistries   Recent Labs Lab 08/22/14 1306 08/23/14 0539 08/24/14 2027 08/25/14 0535 08/26/14 0525  NA  --  137 132* 134* 135  K  --  4.0 3.6 3.7 3.4*  CL  --  102 98 98 98  CO2  --  26 28 26  32  GLUCOSE  --  185* 67*  79 131*  BUN  --  12 28* 25* 13  CREATININE  --  1.04 2.81* 2.18* 1.28  CALCIUM  --  8.4 8.0* 7.9* 8.2*  AST 31  --   --   --   --   ALT 20  --   --   --   --   ALKPHOS 52  --   --   --   --   BILITOT 0.7  --   --   --   --    ------------------------------------------------------------------------------------------------------------------ estimated creatinine  clearance is 65.6 mL/min (by C-G formula based on Cr of 1.28). ------------------------------------------------------------------------------------------------------------------ No results for input(s): HGBA1C in the last 72 hours. ------------------------------------------------------------------------------------------------------------------ No results for input(s): CHOL, HDL, LDLCALC, TRIG, CHOLHDL, LDLDIRECT in the last 72 hours. ------------------------------------------------------------------------------------------------------------------ No results for input(s): TSH, T4TOTAL, T3FREE, THYROIDAB in the last 72 hours.  Invalid input(s): FREET3 ------------------------------------------------------------------------------------------------------------------ No results for input(s): VITAMINB12, FOLATE, FERRITIN, TIBC, IRON, RETICCTPCT in the last 72 hours.  Coagulation profile No results for input(s): INR, PROTIME in the last 168 hours.  No results for input(s): DDIMER in the last 72 hours.  Cardiac Enzymes No results for input(s): CKMB, TROPONINI, MYOGLOBIN in the last 168 hours.  Invalid input(s): CK ------------------------------------------------------------------------------------------------------------------ Invalid input(s): POCBNP     Time Spent in minutes   35   Lala Lund K M.D on 08/26/2014 at 9:42 AM  Between 7am to 7pm - Pager - [phone]  After 7pm go to [email or web address] - [facility] Office  [phone]

## 2014-08-26 NOTE — Clinical Social Work Note (Signed)
Patient will discharge today per MD order. Patient will discharge to: [facility] RN to call report prior to transportation to: [phone] Transportation: [facility]  Discharge plans have been reviewed with patient. All parties agreeable to scheduling transportation at 1:30pm (after lunch).  DC summary has been sent to SNF for review.  Packet complete and placed on chart.  Nonnie Done, [place] [phone]  Psychiatric & Orthopedics (5N 1-16) Clinical Social Worker

## 2014-08-27 ENCOUNTER — Non-Acute Institutional Stay (SKILLED_NURSING_FACILITY): Payer: Medicare Other | Admitting: Adult Health

## 2014-08-27 ENCOUNTER — Encounter (HOSPITAL_COMMUNITY): Payer: Self-pay | Admitting: Orthopaedic Surgery

## 2014-08-27 DIAGNOSIS — N400 Enlarged prostate without lower urinary tract symptoms: Secondary | ICD-10-CM | POA: Diagnosis not present

## 2014-08-27 DIAGNOSIS — I10 Essential (primary) hypertension: Secondary | ICD-10-CM | POA: Diagnosis not present

## 2014-08-27 DIAGNOSIS — N390 Urinary tract infection, site not specified: Secondary | ICD-10-CM | POA: Diagnosis not present

## 2014-08-27 DIAGNOSIS — E039 Hypothyroidism, unspecified: Secondary | ICD-10-CM | POA: Diagnosis not present

## 2014-08-27 DIAGNOSIS — E118 Type 2 diabetes mellitus with unspecified complications: Secondary | ICD-10-CM | POA: Diagnosis not present

## 2014-08-27 DIAGNOSIS — R32 Unspecified urinary incontinence: Secondary | ICD-10-CM

## 2014-08-27 DIAGNOSIS — K219 Gastro-esophageal reflux disease without esophagitis: Secondary | ICD-10-CM

## 2014-08-27 DIAGNOSIS — M109 Gout, unspecified: Secondary | ICD-10-CM | POA: Diagnosis not present

## 2014-08-27 DIAGNOSIS — M1711 Unilateral primary osteoarthritis, right knee: Secondary | ICD-10-CM | POA: Diagnosis not present

## 2014-08-27 DIAGNOSIS — E785 Hyperlipidemia, unspecified: Secondary | ICD-10-CM

## 2014-08-27 DIAGNOSIS — R609 Edema, unspecified: Secondary | ICD-10-CM | POA: Diagnosis not present

## 2014-08-27 DIAGNOSIS — K5900 Constipation, unspecified: Secondary | ICD-10-CM

## 2014-08-27 NOTE — Progress Notes (Signed)
Patient ID: Jonathan [name], male   DOB: [birth year], 79 y.o.   MRN: [id number]   08/27/2014  Facility:  [facility] Home Location:  [place] Room Number: 1206-P LEVEL OF CARE:  SNF (31)   Chief Complaint  Patient presents with  . Hospitalization Follow-up    Osteoarthritis S/P right total knee arthroplasty, gout, diabetes mellitus, UTI, hypothyroidism, hypertension, GERD, hyperlipidemia and BPH    HISTORY OF PRESENT ILLNESS:  This is a 79 year old male who has been admitted to [facility] on 08/26/14 from [facility] with osteoarthritis S/P right total knee arthroplasty. He has past medical history of gout, diabetes mellitus, hypothyroidism and hypertension. He has been admitted for a short-term rehabilitation.  PAST MEDICAL HISTORY:  Past Medical History  Diagnosis Date  . CAD (coronary artery disease)     CABG 2005 by Dr Cyndia Bent  . Hyperlipidemia   . Hypertension   . Hypothyroidism   . Sun-damaged skin   . Benign prostatic hypertrophy   . Gout   . DJD (degenerative joint disease)     bilateral knee, gout  . Hepatitis A 1950  . Hepatitis B 1980's  . Type II diabetes mellitus   . Sleep apnea 1990's    "before uvulectomy"  . GERD (gastroesophageal reflux disease)   . Squamous cell carcinoma     "lots on my arms & face" (09/21/2013)  . Personal history of other infectious and parasitic disease   . Knee joint replacement by other means   . Glomerulonephritis acute     at age 23, due to beta strep, resolved, renal calculi- passed spontaneously  . HOH (hard of hearing)     wears hearing aids  . Incontinence of urine     CURRENT MEDICATIONS: Reviewed per MAR/see medication list  Allergies  Allergen Reactions  . Bee Venom Anaphylaxis     REVIEW OF SYSTEMS:  GENERAL: no change in appetite, no fatigue, no weight changes, no fever, chills or weakness RESPIRATORY: no cough, SOB, DOE, wheezing, hemoptysis CARDIAC: no chest pain, or  palpitations GI: no abdominal pain, diarrhea, heart burn, nausea or vomiting, + constipation  PHYSICAL EXAMINATION  GENERAL: no acute distress, normal body habitus NECK: supple, trachea midline, no neck masses, no thyroid tenderness, no thyromegaly LYMPHATICS: no LAN in the neck, no supraclavicular LAN RESPIRATORY: breathing is even & unlabored, BS CTAB CARDIAC: RRR, no murmur,no extra heart sounds GI: abdomen soft, normal BS, no masses, no tenderness, no hepatomegaly, no splenomegaly EXTREMITIES: Able to move 4 extremities PSYCHIATRIC: the patient is alert & oriented to person, affect & behavior appropriate  LABS/RADIOLOGY: Labs reviewed: Basic Metabolic Panel:  Recent Labs  08/24/14 2027 08/25/14 0535 08/26/14 0525  NA 132* 134* 135  K 3.6 3.7 3.4*  CL 98 98 98  CO2 28 26 32  GLUCOSE 67* 79 131*  BUN 28* 25* 13  CREATININE 2.81* 2.18* 1.28  CALCIUM 8.0* 7.9* 8.2*   Liver Function Tests:  Recent Labs  12/04/13 1103 08/22/14 1306  AST 22 31  ALT 14 20  ALKPHOS 53 52  BILITOT 0.8 0.7  PROT 6.3 6.8  ALBUMIN 4.1 4.3   CBC:  Recent Labs  09/14/13 1300  12/04/13 1103 08/14/14 0854 08/23/14 0539 08/24/14 0432 08/25/14 0535  WBC 6.5  < > 5.9 4.0 5.9 9.5 8.3  NEUTROABS 4.2  --  3.8 2.3  --   --   --   HGB 15.1  < > 14.6 14.6 12.4*  11.3* 9.6*  HCT 42.9  < > 43.0 42.8 36.1* 33.6* 28.1*  MCV 93.5  < > 92.4 91.3 93.5 94.6 91.8  PLT 223  < > 208.0 167 156 171 142*  < > = values in this interval not displayed.  Lipid Panel:  Recent Labs  12/04/13 1103  HDL 55.80   CBG:  Recent Labs  08/25/14 2130 08/26/14 0643 08/26/14 1211  GLUCAP 180* 114* 205*    US Renal  08/24/2014   CLINICAL DATA:  Acute renal failure, hypertension, coronary artery disease, type 2 diabetes, history acute glomerulonephritis, hepatitis A and B  EXAM: RENAL/URINARY TRACT ULTRASOUND COMPLETE  COMPARISON:  None  FINDINGS: Right Kidney:  Length: 14.2 cm. Normal cortical thickness  and echogenicity. No mass, hydronephrosis or shadowing calcification. No perinephric fluid.  Left Kidney:  Length: 14.9 cm. Cyst at upper pole 5.4 x 4.6 x 4.9 cm, simple features. Additional hyperechoic nodule at mid LEFT kidney medially 1.8 x 1.5 x 1.2 cm without shadowing most consistent with an angiomyolipoma. No hydronephrosis or shadowing calcification.  Bladder:  Decompressed by Foley catheter.  IMPRESSION: No evidence of hydronephrosis.  Cyst at upper pole LEFT kidney 5.4 cm greatest diameter.  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   Electronically Signed   By: Lavonia Dana M.D.   On: 08/24/2014 20:33   Dg Chest Port 1 View  08/24/2014   CLINICAL DATA:  Fever.  History of CABG, hypertension and diabetes.  EXAM: PORTABLE CHEST - 1 VIEW  COMPARISON:  09/14/2013  FINDINGS: Patient is post median sternotomy and CABG. There is cardiomegaly without appears progressed from prior exam. No confluent airspace disease to suggest pneumonia. There is peribronchial cuffing that may reflect bronchitis. No pleural effusion or pneumothorax. No acute osseous abnormality is seen.  IMPRESSION: 1. Mild cardiomegaly, new from prior exam. 2. Peribronchial cuffing, may reflect bronchitis. There is no evidence of pneumonia.   Electronically Signed   By: Jeb Levering M.D.   On: 08/24/2014 19:07    ASSESSMENT/PLAN:  Osteoarthritis S/P right total knee arthroplasty - for rehabilitation; continue aspirin 325 mg EC by mouth twice a day for DVT prophylaxis; oxycodone 5 mg 1-2 tabs by mouth every 4 hours when necessary for pain and Robaxin 500 mg by mouth every 6 hours when necessary for muscle spasm Gout - continue allopurinol 300 mg by mouth daily Diabetes mellitus, type II - continue Glucotrol 10 mg by mouth twice a day, Actos 45 mg by mouth daily and Lantus 30 units subcutaneous daily at bedtime UTI - continue Levaquin 500 mg PO daily x 3 days Hypothyroidism -4/15 TSH  1.49; continue Synthroid 100 g by mouth daily Hypertension - well controlled; continue Toprol-XL 12.5 mg by mouth daily Edema, LLE - continue Lasix 20 mg by mouth daily Urinary incontinence - continue Myrbetriq 50 mg by mouth daily GERD - stable; continue Prilosec 20 mg by mouth twice a day Hyperlipidemia - continue Zocor 40 mg by mouth daily at bedtime BPH - continue Flomax 0.4 mg by mouth daily Constipation - start Senn-S 2 tabs PO BID X 2 days then Q HS and Miralax 17 g +4-6 ounce liquid by mouth twice a day 1 day then daily   Goals of care:  Short-term rehabilitation    Labs/test ordered:  none   Spent 50 minutes in patient care.    [facility], NP [employer] [phone]

## 2014-08-29 ENCOUNTER — Non-Acute Institutional Stay (SKILLED_NURSING_FACILITY): Payer: Medicare Other | Admitting: Internal Medicine

## 2014-08-29 DIAGNOSIS — I10 Essential (primary) hypertension: Secondary | ICD-10-CM

## 2014-08-29 DIAGNOSIS — E785 Hyperlipidemia, unspecified: Secondary | ICD-10-CM | POA: Diagnosis not present

## 2014-08-29 DIAGNOSIS — N3000 Acute cystitis without hematuria: Secondary | ICD-10-CM | POA: Diagnosis not present

## 2014-08-29 DIAGNOSIS — E039 Hypothyroidism, unspecified: Secondary | ICD-10-CM | POA: Diagnosis not present

## 2014-08-29 DIAGNOSIS — R609 Edema, unspecified: Secondary | ICD-10-CM

## 2014-08-29 DIAGNOSIS — K219 Gastro-esophageal reflux disease without esophagitis: Secondary | ICD-10-CM | POA: Diagnosis not present

## 2014-08-29 DIAGNOSIS — M1711 Unilateral primary osteoarthritis, right knee: Secondary | ICD-10-CM

## 2014-08-29 DIAGNOSIS — N400 Enlarged prostate without lower urinary tract symptoms: Secondary | ICD-10-CM | POA: Diagnosis not present

## 2014-08-29 DIAGNOSIS — E118 Type 2 diabetes mellitus with unspecified complications: Secondary | ICD-10-CM | POA: Diagnosis not present

## 2014-08-29 DIAGNOSIS — R32 Unspecified urinary incontinence: Secondary | ICD-10-CM | POA: Diagnosis not present

## 2014-08-29 DIAGNOSIS — K5900 Constipation, unspecified: Secondary | ICD-10-CM | POA: Diagnosis not present

## 2014-08-31 LAB — CULTURE, BLOOD (ROUTINE X 2)
CULTURE: NO GROWTH
Culture: NO GROWTH

## 2014-09-02 ENCOUNTER — Ambulatory Visit (INDEPENDENT_AMBULATORY_CARE_PROVIDER_SITE_OTHER): Payer: Medicare Other | Admitting: Endocrinology

## 2014-09-02 ENCOUNTER — Encounter: Payer: Self-pay | Admitting: Endocrinology

## 2014-09-02 ENCOUNTER — Other Ambulatory Visit: Payer: Self-pay | Admitting: Endocrinology

## 2014-09-02 VITALS — BP 138/84 | HR 81 | Temp 97.9°F | Ht 74.0 in | Wt 242.0 lb

## 2014-09-02 DIAGNOSIS — E118 Type 2 diabetes mellitus with unspecified complications: Secondary | ICD-10-CM

## 2014-09-02 LAB — BASIC METABOLIC PANEL
BUN: 18 mg/dL (ref 6–23)
CALCIUM: 9.5 mg/dL (ref 8.4–10.5)
CO2: 25 meq/L (ref 19–32)
Chloride: 104 mEq/L (ref 96–112)
Creatinine, Ser: 1.01 mg/dL (ref 0.40–1.50)
GFR: 76.25 mL/min (ref >60.00)
Glucose, Bld: 112 mg/dL — ABNORMAL HIGH (ref 70–99)
Potassium: 4.1 mEq/L (ref 3.5–5.1)
SODIUM: 140 meq/L (ref 135–145)

## 2014-09-02 LAB — HEMOGLOBIN A1C: Hgb A1c MFr Bld: 7.1 % — ABNORMAL HIGH (ref 4.6–6.5)

## 2014-09-02 MED ORDER — GLIPIZIDE 5 MG PO TABS: 5.0000 mg | ORAL_TABLET | Freq: Two times a day (BID) | ORAL | Status: DC

## 2014-09-02 NOTE — Patient Instructions (Addendum)
good diet and exercise habits significanly improve the control of your diabetes.  please let me know if you wish to be referred to a dietician.  high blood sugar is very risky to your health.  you should see an eye doctor and dentist every year.  It is very important to get all recommended vaccinations.  controlling your blood pressure and cholesterol drastically reduces the damage diabetes does to your body.  Those who smoke should quit.  please discuss these with your doctor.  check your blood sugar once a day.  vary the time of day when you check, between before the 3 meals, and at bedtime.  also check if you have symptoms of your blood sugar being too high or too low.  please keep a record of the readings and bring it to your next appointment here.  You can write it on any piece of paper.  please call us sooner if your blood sugar goes below 70, or if you have a lot of readings over 200. blood tests are being requested for you today.  We'll let you know about the results.  Please come back for a follow-up appointment in 1 month.   we will need to take this complex situation in stages.

## 2014-09-02 NOTE — Progress Notes (Signed)
Subjective:    Patient ID: Jonathan [name], male    DOB: [birth year], 79 y.o.   MRN: [id number]  HPI pt states DM was dx'ed in 1995; he has mild neuropathy of the lower extremities; he has associated CAD and renal insufficiency; he has been on insulin since 2008; pt says his diet and exercise are good; he has never had pancreatitis, severe hypoglycemia or DKA.  He says cbg's vary from 80-150.  He takes the lantus only approx every other day.    Past Medical History  Diagnosis Date  . CAD (coronary artery disease)     CABG 2005 by Dr Cyndia Bent  . Hyperlipidemia   . Hypertension   . Hypothyroidism   . Sun-damaged skin   . Benign prostatic hypertrophy   . Gout   . DJD (degenerative joint disease)     bilateral knee, gout  . Hepatitis A 1950  . Hepatitis B 1980's  . Type II diabetes mellitus   . Sleep apnea 1990's    "before uvulectomy"  . GERD (gastroesophageal reflux disease)   . Squamous cell carcinoma     "lots on my arms & face" (09/21/2013)  . Personal history of other infectious and parasitic disease   . Knee joint replacement by other means   . Glomerulonephritis acute     at age 51, due to beta strep, resolved, renal calculi- passed spontaneously  . HOH (hard of hearing)     wears hearing aids  . Incontinence of urine     Past Surgical History  Procedure Laterality Date  . Coronary artery bypass graft  01/23/04    Dr Cyndia Bent  . Inguinal hernia repair Bilateral 1980's - 1990's  . Umbilical hernia repair    . Mastoidectomy Right 1943  . Quadriceps repair Right 2009  . Uvulopalatoplasty  [id number]    ENT MD  . Total knee arthroplasty Left 09/18/2013    Procedure: TOTAL KNEE ARTHROPLASTY;  Surgeon: Hessie Dibble, MD;  Location: [facility];  Service: Orthopedics;  Laterality: Left;  . Cataract extraction w/ intraocular lens  implant, bilateral Bilateral   . Retinal detachment surgery Right X 2  . Cardiac catheterization    . Tonsillectomy  ~ 1943  . Eye surgery Right    detached retina ,blind in right eye  . Total knee arthroplasty Right 08/22/2014    Procedure: RIGHT TOTAL KNEE ARTHROPLASTY;  Surgeon: Hessie Dibble, MD;  Location: [facility];  Service: Orthopedics;  Laterality: Right;    History   Social History  . Marital Status: Married    Spouse Name: N/A    Number of Children: N/A  . Years of Education: N/A   Occupational History  . retired    Social History Main Topics  . Smoking status: Former Smoker -- 1.00 packs/day for 10 years    Types: Cigarettes    Quit date: 09/09/1957  . Smokeless tobacco: Not on file  . Alcohol Use: 8.4 oz/week    14 Shots of liquor per week     Comment: 1 2 oz shots of vodka per night  . Drug Use: No  . Sexual Activity: Not Currently   Other Topics Concern  . Not on file   Social History Narrative   Pt lives in [place].  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 allopurinol (ZYLOPRIM) 300 MG tablet TAKE 1 TABLET EVERY DAY 90 tablet 3  .  aspirin 325 MG EC tablet Take 1 tablet (325 mg total) by mouth 2 (two) times daily. 30 tablet 0  . cetirizine (ZYRTEC) 10 MG tablet Take 10 mg by mouth daily as needed for allergies.    [name] Kitchen glipiZIDE (GLUCOTROL) 10 MG tablet Take 1 tablet (10 mg total) by mouth 2 (two) times daily. 200 tablet 3  . insulin glargine (LANTUS) 100 UNIT/ML injection Inject 0.3 mLs (30 Units total) into the skin at bedtime. (Patient taking differently: Inject 30 Units into the skin 2 (two) times daily. ) 10 mL 10  . levothyroxine (SYNTHROID, LEVOTHROID) 100 MCG tablet Take 1 tablet (100 mcg total) by mouth every morning. (Patient taking differently: Take 100 mcg by mouth daily before breakfast. ) 100 tablet 3  . metoprolol succinate (TOPROL-XL) 25 MG 24 hr tablet Take 0.5 tablets (12.5 mg total) by mouth daily. 45 tablet 3  . mirabegron ER (MYRBETRIQ) 50 MG TB24 tablet Take 50 mg by mouth daily before  breakfast.     . Multiple Vitamin (MULTIVITAMIN PO) Take 1 tablet by mouth daily.      . niacin 100 MG tablet Take 100 mg by mouth daily.      . nitroGLYCERIN (NITROSTAT) 0.4 MG SL tablet Place 1 tablet (0.4 mg total) under the tongue as needed. (Patient taking differently: Place 0.4 mg under the tongue as needed for chest pain. ) 25 tablet 1  . Omega-3 Fatty Acids (FISH OIL) 1000 MG CAPS Take 1,000 mg by mouth 2 (two) times daily.    [name] Kitchen omeprazole (PRILOSEC) 20 MG capsule Take 1 capsule (20 mg total) by mouth 2 (two) times daily before a meal. (Patient taking differently: Take 20 mg by mouth 2 (two) times daily as needed (acid reflux). ) 180 capsule 3  . pioglitazone (ACTOS) 45 MG tablet Take 1 tablet (45 mg total) by mouth daily. 90 tablet 3  . furosemide (LASIX) 20 MG tablet Take 20 mg by mouth daily as needed for fluid or edema.    [name] Kitchen levofloxacin (LEVAQUIN) 500 MG tablet Take 1 tablet (500 mg total) by mouth daily. 3 more days (Patient not taking: Reported on 09/02/2014) 3 tablet 0  . methocarbamol (ROBAXIN) 500 MG tablet Take 1 tablet (500 mg total) by mouth every 6 (six) hours as needed for muscle spasms. (Patient not taking: Reported on 09/02/2014) 50 tablet 0  . oxyCODONE (OXY IR/ROXICODONE) 5 MG immediate release tablet Take 1-2 tablets (5-10 mg total) by mouth every 4 (four) hours as needed for breakthrough pain. (Patient not taking: Reported on 09/02/2014) 50 tablet 0  . simvastatin (ZOCOR) 40 MG tablet Take 1 tablet (40 mg total) by mouth at bedtime. (Patient not taking: Reported on 09/02/2014) 100 tablet 3  . tamsulosin (FLOMAX) 0.4 MG CAPS capsule Take 1 capsule (0.4 mg total) by mouth daily. (Patient not taking: Reported on 09/02/2014) 10 capsule 0  . terazosin (HYTRIN) 1 MG capsule Take 1 capsule (1 mg total) by mouth at bedtime as needed (overactive bladder). (Patient not taking: Reported on 09/02/2014) 90 capsule 3   No current facility-administered medications on file prior to visit.     Allergies  Allergen Reactions  . Bee Venom Anaphylaxis    Family History  Problem Relation Age of Onset  . Diabetes Other     1st degree relative  . Hypertension      Family History    BP 138/84 mmHg  Pulse 81  Temp(Src) 97.9 F (36.6 C) (Oral)  Ht 6\' 2"  (  1.88 m)  Wt 242 lb (109.77 kg)  BMI 31.06 kg/m2  SpO2 98%  Review of Systems denies headache, chest pain, sob, n/v, muscle cramps, excessive diaphoresis, memory loss, cold intolerance, and easy bruising.  He has gained a few lbs.  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  He attributes urinary frequency to prostatism.  He has rhinorrhea.    Objective:   Physical Exam VITAL SIGNS:  See vs page.  GENERAL: no distress. Pulses: dorsalis pedis intact bilat.   MSK: no deformity of the feet.  CV: 1+ leg edema (recently had right TKR).  Skin:  no ulcer on the feet.  normal color and temp on the feet, except for a few ecchymoses of the right foot.  Neuro: sensation is intact to touch on the feet.   Ext: There is bilateral onychomycosis of the toenails.    Lab Results  Component Value Date   CREATININE 1.28 08/26/2014   BUN 13 08/26/2014   NA 135 08/26/2014   K 3.4* 08/26/2014   CL 98 08/26/2014   CO2 32 08/26/2014       Assessment & Plan:  Renal insuff, severe exacerbation in hospital, improved. DM: new to me: therapy options limited by renal insuff.  He'll likely need several changes in his meds.    Patient is advised the following: Patient Instructions  good diet and exercise habits significanly improve the control of your diabetes.  please let me know if you wish to be referred to a dietician.  high blood sugar is very risky to your health.  you should see an eye doctor and dentist every year.  It is very important to get all recommended vaccinations.  controlling your blood pressure and cholesterol drastically reduces the damage diabetes does to your body.  Those who smoke should quit.  please  discuss these with your doctor.  check your blood sugar once a day.  vary the time of day when you check, between before the 3 meals, and at bedtime.  also check if you have symptoms of your blood sugar being too high or too low.  please keep a record of the readings and bring it to your next appointment here.  You can write it on any piece of paper.  please call us sooner if your blood sugar goes below 70, or if you have a lot of readings over 200. blood tests are being requested for you today.  We'll let you know about the results.  Please come back for a follow-up appointment in 1 month.   we will need to take this complex situation in stages.

## 2014-09-03 ENCOUNTER — Telehealth: Payer: Self-pay | Admitting: Endocrinology

## 2014-09-03 DIAGNOSIS — Z471 Aftercare following joint replacement surgery: Secondary | ICD-10-CM | POA: Diagnosis not present

## 2014-09-03 DIAGNOSIS — M25561 Pain in right knee: Secondary | ICD-10-CM | POA: Diagnosis not present

## 2014-09-03 DIAGNOSIS — M25661 Stiffness of right knee, not elsewhere classified: Secondary | ICD-10-CM | POA: Diagnosis not present

## 2014-09-03 DIAGNOSIS — R262 Difficulty in walking, not elsewhere classified: Secondary | ICD-10-CM | POA: Diagnosis not present

## 2014-09-03 NOTE — Telephone Encounter (Signed)
Pt called wanting to know if he should continue taking metformin 1000mg  2/day. Pt was not given this medication in the hospital, but when he was released he started taking it again.  Please advise if pt should continue medication. Thanks!

## 2014-09-03 NOTE — Telephone Encounter (Signed)
Please continue for now, but we may revisit this in the future

## 2014-09-03 NOTE — Telephone Encounter (Signed)
Pt needs a call back

## 2014-09-04 DIAGNOSIS — Z96653 Presence of artificial knee joint, bilateral: Secondary | ICD-10-CM | POA: Diagnosis not present

## 2014-09-04 DIAGNOSIS — Z471 Aftercare following joint replacement surgery: Secondary | ICD-10-CM | POA: Diagnosis not present

## 2014-09-04 NOTE — Telephone Encounter (Signed)
Pt advised of note below and voiced understanding.  

## 2014-09-05 DIAGNOSIS — Z471 Aftercare following joint replacement surgery: Secondary | ICD-10-CM | POA: Diagnosis not present

## 2014-09-05 DIAGNOSIS — R262 Difficulty in walking, not elsewhere classified: Secondary | ICD-10-CM | POA: Diagnosis not present

## 2014-09-05 DIAGNOSIS — M25661 Stiffness of right knee, not elsewhere classified: Secondary | ICD-10-CM | POA: Diagnosis not present

## 2014-09-05 DIAGNOSIS — M25561 Pain in right knee: Secondary | ICD-10-CM | POA: Diagnosis not present

## 2014-09-06 ENCOUNTER — Other Ambulatory Visit: Payer: Self-pay | Admitting: Internal Medicine

## 2014-09-09 DIAGNOSIS — M25661 Stiffness of right knee, not elsewhere classified: Secondary | ICD-10-CM | POA: Diagnosis not present

## 2014-09-09 DIAGNOSIS — M25561 Pain in right knee: Secondary | ICD-10-CM | POA: Diagnosis not present

## 2014-09-09 DIAGNOSIS — R262 Difficulty in walking, not elsewhere classified: Secondary | ICD-10-CM | POA: Diagnosis not present

## 2014-09-11 DIAGNOSIS — R262 Difficulty in walking, not elsewhere classified: Secondary | ICD-10-CM | POA: Diagnosis not present

## 2014-09-11 DIAGNOSIS — Z471 Aftercare following joint replacement surgery: Secondary | ICD-10-CM | POA: Diagnosis not present

## 2014-09-11 DIAGNOSIS — M25661 Stiffness of right knee, not elsewhere classified: Secondary | ICD-10-CM | POA: Diagnosis not present

## 2014-09-11 DIAGNOSIS — M25561 Pain in right knee: Secondary | ICD-10-CM | POA: Diagnosis not present

## 2014-09-13 DIAGNOSIS — R339 Retention of urine, unspecified: Secondary | ICD-10-CM | POA: Diagnosis not present

## 2014-09-13 DIAGNOSIS — N401 Enlarged prostate with lower urinary tract symptoms: Secondary | ICD-10-CM | POA: Diagnosis not present

## 2014-09-17 DIAGNOSIS — Z471 Aftercare following joint replacement surgery: Secondary | ICD-10-CM | POA: Diagnosis not present

## 2014-09-17 DIAGNOSIS — M25661 Stiffness of right knee, not elsewhere classified: Secondary | ICD-10-CM | POA: Diagnosis not present

## 2014-09-17 DIAGNOSIS — R262 Difficulty in walking, not elsewhere classified: Secondary | ICD-10-CM | POA: Diagnosis not present

## 2014-09-17 DIAGNOSIS — M25561 Pain in right knee: Secondary | ICD-10-CM | POA: Diagnosis not present

## 2014-09-18 DIAGNOSIS — Z9889 Other specified postprocedural states: Secondary | ICD-10-CM | POA: Diagnosis not present

## 2014-09-19 DIAGNOSIS — R262 Difficulty in walking, not elsewhere classified: Secondary | ICD-10-CM | POA: Diagnosis not present

## 2014-09-19 DIAGNOSIS — M25661 Stiffness of right knee, not elsewhere classified: Secondary | ICD-10-CM | POA: Diagnosis not present

## 2014-09-19 DIAGNOSIS — M25561 Pain in right knee: Secondary | ICD-10-CM | POA: Diagnosis not present

## 2014-09-24 DIAGNOSIS — M25661 Stiffness of right knee, not elsewhere classified: Secondary | ICD-10-CM | POA: Diagnosis not present

## 2014-09-24 DIAGNOSIS — R262 Difficulty in walking, not elsewhere classified: Secondary | ICD-10-CM | POA: Diagnosis not present

## 2014-09-24 DIAGNOSIS — M25561 Pain in right knee: Secondary | ICD-10-CM | POA: Diagnosis not present

## 2014-09-24 DIAGNOSIS — Z471 Aftercare following joint replacement surgery: Secondary | ICD-10-CM | POA: Diagnosis not present

## 2014-09-26 DIAGNOSIS — M25661 Stiffness of right knee, not elsewhere classified: Secondary | ICD-10-CM | POA: Diagnosis not present

## 2014-09-26 DIAGNOSIS — R262 Difficulty in walking, not elsewhere classified: Secondary | ICD-10-CM | POA: Diagnosis not present

## 2014-09-26 DIAGNOSIS — Z471 Aftercare following joint replacement surgery: Secondary | ICD-10-CM | POA: Diagnosis not present

## 2014-09-26 DIAGNOSIS — M25561 Pain in right knee: Secondary | ICD-10-CM | POA: Diagnosis not present

## 2014-09-27 ENCOUNTER — Telehealth: Payer: Self-pay | Admitting: Internal Medicine

## 2014-09-27 ENCOUNTER — Emergency Department (HOSPITAL_COMMUNITY)
Admission: EM | Admit: 2014-09-27 | Discharge: 2014-09-27 | Disposition: A | Discharge status: Home / Self Care | Payer: Medicare Other | Attending: Emergency Medicine | Admitting: Emergency Medicine

## 2014-09-27 ENCOUNTER — Encounter (HOSPITAL_COMMUNITY): Payer: Self-pay | Admitting: Emergency Medicine

## 2014-09-27 ENCOUNTER — Telehealth: Payer: Self-pay | Admitting: Family Medicine

## 2014-09-27 DIAGNOSIS — L259 Unspecified contact dermatitis, unspecified cause: Secondary | ICD-10-CM | POA: Diagnosis not present

## 2014-09-27 DIAGNOSIS — K5641 Fecal impaction: Secondary | ICD-10-CM | POA: Insufficient documentation

## 2014-09-27 DIAGNOSIS — E119 Type 2 diabetes mellitus without complications: Secondary | ICD-10-CM | POA: Insufficient documentation

## 2014-09-27 DIAGNOSIS — Z794 Long term (current) use of insulin: Secondary | ICD-10-CM | POA: Insufficient documentation

## 2014-09-27 DIAGNOSIS — I2510 Atherosclerotic heart disease of native coronary artery without angina pectoris: Secondary | ICD-10-CM | POA: Insufficient documentation

## 2014-09-27 DIAGNOSIS — M109 Gout, unspecified: Secondary | ICD-10-CM | POA: Insufficient documentation

## 2014-09-27 DIAGNOSIS — Z85828 Personal history of other malignant neoplasm of skin: Secondary | ICD-10-CM | POA: Insufficient documentation

## 2014-09-27 DIAGNOSIS — L309 Dermatitis, unspecified: Secondary | ICD-10-CM | POA: Diagnosis not present

## 2014-09-27 DIAGNOSIS — Z951 Presence of aortocoronary bypass graft: Secondary | ICD-10-CM | POA: Insufficient documentation

## 2014-09-27 DIAGNOSIS — E785 Hyperlipidemia, unspecified: Secondary | ICD-10-CM | POA: Insufficient documentation

## 2014-09-27 DIAGNOSIS — K5901 Slow transit constipation: Secondary | ICD-10-CM | POA: Diagnosis not present

## 2014-09-27 DIAGNOSIS — Z8669 Personal history of other diseases of the nervous system and sense organs: Secondary | ICD-10-CM | POA: Insufficient documentation

## 2014-09-27 DIAGNOSIS — K219 Gastro-esophageal reflux disease without esophagitis: Secondary | ICD-10-CM | POA: Insufficient documentation

## 2014-09-27 DIAGNOSIS — E039 Hypothyroidism, unspecified: Secondary | ICD-10-CM | POA: Diagnosis not present

## 2014-09-27 DIAGNOSIS — I10 Essential (primary) hypertension: Secondary | ICD-10-CM | POA: Insufficient documentation

## 2014-09-27 DIAGNOSIS — Z87438 Personal history of other diseases of male genital organs: Secondary | ICD-10-CM | POA: Diagnosis not present

## 2014-09-27 DIAGNOSIS — Z79899 Other long term (current) drug therapy: Secondary | ICD-10-CM | POA: Insufficient documentation

## 2014-09-27 DIAGNOSIS — Z87891 Personal history of nicotine dependence: Secondary | ICD-10-CM | POA: Diagnosis not present

## 2014-09-27 DIAGNOSIS — K5900 Constipation, unspecified: Secondary | ICD-10-CM | POA: Diagnosis present

## 2014-09-27 DIAGNOSIS — Z8619 Personal history of other infectious and parasitic diseases: Secondary | ICD-10-CM | POA: Diagnosis not present

## 2014-09-27 DIAGNOSIS — Z7982 Long term (current) use of aspirin: Secondary | ICD-10-CM | POA: Insufficient documentation

## 2014-09-27 MED ORDER — FENTANYL CITRATE 0.05 MG/ML IJ SOLN
50.0000 ug | Freq: Once | INTRAMUSCULAR | Status: AC
  Administered 2014-09-27: 50 ug via INTRAVENOUS
  Filled 2014-09-27: qty 2

## 2014-09-27 MED ORDER — LIDOCAINE VISCOUS 2 % MT SOLN
15.0000 mL | Freq: Once | OROMUCOSAL | Status: AC
  Administered 2014-09-27: 15 mL via OROMUCOSAL
  Filled 2014-09-27: qty 15

## 2014-09-27 MED ORDER — LIDOCAINE HCL 4 % EX SOLN: Freq: Three times a day (TID) | CUTANEOUS | Status: DC | PRN

## 2014-09-27 MED ORDER — FENTANYL CITRATE 0.05 MG/ML IJ SOLN
50.0000 ug | Freq: Once | INTRAMUSCULAR | Status: AC
Start: 2014-09-27 — End: 2014-09-27
  Administered 2014-09-27: 50 ug via INTRAVENOUS
  Filled 2014-09-27: qty 2

## 2014-09-27 NOTE — Telephone Encounter (Signed)
Spoke with patient and he has been on narcotics for knee replacement. He states he is impacted from narcotic use and needs "attention right now." He states she cannot wait until afternoon and he is going to ED.

## 2014-09-27 NOTE — ED Provider Notes (Signed)
CSN: [id number]     Arrival date & time 09/27/14  1014 History   First MD Initiated Contact with Patient 09/27/14 1018     No chief complaint on file.    (Consider location/radiation/quality/duration/timing/severity/associated sxs/prior Treatment) HPI Comments: Pt is a 79 y.o. male presenting with rectal pain/constipation. He had recent total knee replacement, taking norco 10mg  at a time. Last BM was normal 2 days ago, no blood or dark stools, was not constipated. He states he has not been taking any stool softeners or laxatives regularly while on the norco. Today he woke with rectal pain and unable to have a BM. Took 2 glasses of miralax, 8 dulcolax, attempted manual disimpaction and enema all without relief. Pain is only located in his rectum, no abdominal pain. He denies any nausea/vomiting. He is not having any CP, no SOB.  Patient is a 79 y.o. male presenting with constipation. The history is provided by the patient and the spouse. No language interpreter was used.  Constipation Severity:  Moderate Time since last bowel movement:  2 days Timing:  Constant Progression:  Worsening Chronicity:  New Context: medication and narcotics   Stool description:  None produced Relieved by:  Nothing Ineffective treatments:  Enemas, Miralax, laxatives and stool softeners Associated symptoms: no abdominal pain, no back pain, no diarrhea, no dysuria, no fever, no hematochezia, no nausea and no vomiting   Risk factors: change in medication     Past Medical History  Diagnosis Date  . CAD (coronary artery disease)     CABG 2005 by Dr Cyndia Bent  . Hyperlipidemia   . Hypertension   . Hypothyroidism   . Sun-damaged skin   . Benign prostatic hypertrophy   . Gout   . DJD (degenerative joint disease)     bilateral knee, gout  . Hepatitis A 1950  . Hepatitis B 1980's  . Type II diabetes mellitus   . Sleep apnea 1990's    "before uvulectomy"  . GERD (gastroesophageal reflux disease)   . Squamous  cell carcinoma     "lots on my arms & face" (09/21/2013)  . Personal history of other infectious and parasitic disease   . Knee joint replacement by other means   . Glomerulonephritis acute     at age 32, due to beta strep, resolved, renal calculi- passed spontaneously  . HOH (hard of hearing)     wears hearing aids  . Incontinence of urine    Past Surgical History  Procedure Laterality Date  . Coronary artery bypass graft  01/23/04    Dr Cyndia Bent  . Inguinal hernia repair Bilateral 1980's - 1990's  . Umbilical hernia repair    . Mastoidectomy Right 1943  . Quadriceps repair Right 2009  . Uvulopalatoplasty  [id number]    ENT MD  . Total knee arthroplasty Left 09/18/2013    Procedure: TOTAL KNEE ARTHROPLASTY;  Surgeon: Hessie Dibble, MD;  Location: [facility];  Service: Orthopedics;  Laterality: Left;  . Cataract extraction w/ intraocular lens  implant, bilateral Bilateral   . Retinal detachment surgery Right X 2  . Cardiac catheterization    . Tonsillectomy  ~ 1943  . Eye surgery Right     detached retina ,blind in right eye  . Total knee arthroplasty Right 08/22/2014    Procedure: RIGHT TOTAL KNEE ARTHROPLASTY;  Surgeon: Hessie Dibble, MD;  Location: [facility];  Service: Orthopedics;  Laterality: Right;   Family History  Problem Relation Age of Onset  . Diabetes Other  1st degree relative  . Hypertension      Family History   History  Substance Use Topics  . Smoking status: Former Smoker -- 1.00 packs/day for 10 years    Types: Cigarettes    Quit date: 09/09/1957  . Smokeless tobacco: Not on file  . Alcohol Use: 8.4 oz/week    14 Shots of liquor per week     Comment: 1 2 oz shots of vodka per night    Review of Systems  Constitutional: Negative for fever.  Gastrointestinal: Positive for constipation. Negative for nausea, vomiting, abdominal pain, diarrhea and hematochezia.  Genitourinary: Negative for dysuria.  Musculoskeletal: Negative for back pain.  Neurological:  Negative for dizziness and light-headedness.  All other systems reviewed and are negative.     Allergies  Bee venom  Home Medications   Prior to Admission medications   Medication Sig Start Date End Date Taking? Authorizing Provider  allopurinol (ZYLOPRIM) 300 MG tablet TAKE 1 TABLET EVERY DAY 04/16/14  Yes Dorena Cookey, MD  aspirin 325 MG EC tablet Take 1 tablet (325 mg total) by mouth 2 (two) times daily. Patient taking differently: Take 325 mg by mouth daily.  08/23/14  Yes [name] Nida, PA-C  cetirizine (ZYRTEC) 10 MG tablet Take 10 mg by mouth daily as needed for allergies.   Yes Historical Provider, MD  furosemide (LASIX) 20 MG tablet Take 20 mg by mouth daily as needed for fluid or edema.   Yes Historical Provider, MD  glipiZIDE (GLUCOTROL) 5 MG tablet Take 1 tablet (5 mg total) by mouth 2 (two) times daily before a meal. 09/02/14  Yes Renato Shin, MD  insulin glargine (LANTUS) 100 UNIT/ML injection Inject 0.3 mLs (30 Units total) into the skin at bedtime. Patient taking differently: Inject 30 Units into the skin daily as needed (for high glucose level).  12/04/13  Yes Dorena Cookey, MD  levothyroxine (SYNTHROID, LEVOTHROID) 100 MCG tablet Take 1 tablet (100 mcg total) by mouth every morning. Patient taking differently: Take 100 mcg by mouth daily before breakfast.  12/04/13  Yes Dorena Cookey, MD  metformin (FORTAMET) 1000 MG (OSM) 24 hr tablet Take 1,000 mg by mouth 2 (two) times daily with a meal.   Yes Historical Provider, MD  methocarbamol (ROBAXIN) 500 MG tablet Take 1 tablet (500 mg total) by mouth every 6 (six) hours as needed for muscle spasms. 08/23/14  Yes [name] Nida, PA-C  metoprolol succinate (TOPROL-XL) 25 MG 24 hr tablet Take 0.5 tablets (12.5 mg total) by mouth daily. 02/04/14  Yes Thompson Grayer, MD  mirabegron ER (MYRBETRIQ) 50 MG TB24 tablet Take 50 mg by mouth daily before breakfast.    Yes Historical Provider, MD  Multiple Vitamin (MULTIVITAMIN PO) Take 1  tablet by mouth daily.     Yes Historical Provider, MD  niacin 100 MG tablet Take 100 mg by mouth daily.     Yes Historical Provider, MD  nitroGLYCERIN (NITROSTAT) 0.4 MG SL tablet Place 1 tablet (0.4 mg total) under the tongue as needed. Patient taking differently: Place 0.4 mg under the tongue as needed for chest pain.  12/04/13  Yes Dorena Cookey, MD  Omega-3 Fatty Acids (FISH OIL) 1000 MG CAPS Take 1,000 mg by mouth 2 (two) times daily.   Yes Historical Provider, MD  omeprazole (PRILOSEC) 20 MG capsule Take 1 capsule (20 mg total) by mouth 2 (two) times daily before a meal. Patient taking differently: Take 20 mg by mouth 2 (two)  times daily as needed (acid reflux).  12/06/13  Yes Dorena Cookey, MD  oxyCODONE (OXY IR/ROXICODONE) 5 MG immediate release tablet Take 1-2 tablets (5-10 mg total) by mouth every 4 (four) hours as needed for breakthrough pain. 08/23/14  Yes [name] Nida, PA-C  pioglitazone (ACTOS) 45 MG tablet Take 1 tablet (45 mg total) by mouth daily. 12/04/13  Yes Dorena Cookey, MD  polyethylene glycol [facility] / GLYCOLAX) packet Take 17 g by mouth daily as needed for moderate constipation.   Yes Historical Provider, MD  senna (SENOKOT) 8.6 MG tablet Take 1-2 tablets by mouth daily as needed for constipation.   Yes Historical Provider, MD  simvastatin (ZOCOR) 40 MG tablet Take 1 tablet (40 mg total) by mouth at bedtime. 12/04/13  Yes Dorena Cookey, MD  terazosin (HYTRIN) 1 MG capsule Take 1 capsule (1 mg total) by mouth at bedtime as needed (overactive bladder). Patient taking differently: Take 1 mg by mouth daily.  12/04/13  Yes Dorena Cookey, MD  levofloxacin (LEVAQUIN) 500 MG tablet Take 1 tablet (500 mg total) by mouth daily. 3 more days Patient not taking: Reported on 09/02/2014 08/26/14   Thurnell Lose, MD  tamsulosin (FLOMAX) 0.4 MG CAPS capsule Take 1 capsule (0.4 mg total) by mouth daily. Patient not taking: Reported on 09/27/2014 08/23/14   [name] Nida, PA-C   BP  132/93 mmHg  Pulse 82  Temp(Src) 98 F (36.7 C) (Oral)  Resp 17  SpO2 99% Physical Exam  Constitutional: He is oriented to person, place, and time. He appears well-developed and well-nourished. He appears distressed.  HENT:  Head: Normocephalic and atraumatic.  Pulmonary/Chest: Effort normal. No respiratory distress.  Abdominal: Soft. There is no tenderness.  Genitourinary: Rectal exam shows tenderness. Rectal exam shows no fissure, no mass and anal tone normal.     Musculoskeletal: He exhibits no edema.  Neurological: He is alert and oriented to person, place, and time. Coordination normal.  Skin: Skin is warm and dry. There is erythema (around anus).  Psychiatric: He has a normal mood and affect. His behavior is normal. Judgment and thought content normal.  Nursing note and vitals reviewed.   ED Course  Procedures (including critical care time) Labs Review Labs Reviewed - No data to display  Imaging Review No results found.   EKG Interpretation None      Attempted manual disimpaction with some removal, patient in pain, decision made to stop while stool still felt in vault, try soap suds enema and will place IV and give fentanyl before reattempting manual disimpaction. No frank blood noted.  Repeat: some stool with enema. Second manual disimpaction more successful moving handful of stool, still present but pt could not tolerate more due to pain. Will again repeat enema, if unsuccessful repeat fentanyl right before repeat manual disimpaction. Stool mostly soft now but still unable to move bowel.  Repeat: erythema and irritated around anus, will give additional fentanyl, lidocaine solution applied around anus, pt to walk to reattempt enema.   MDM   Final diagnoses:  Fecal impaction   Fecal impaction secondary to opioid induced constipation. Multiple manual disimpaction and enemas performed in ED, still without large BM.   Care fully transferred to Dr. Venora Maples at [place], MD 09/27/14 [employer], MD 09/27/14 [place], MD 09/27/14 [phone]

## 2014-09-27 NOTE — ED Notes (Signed)
Pt had total knee 5 weeks ago and is taking narcotics for pain. Pt states has not had BM in 2 days.

## 2014-09-27 NOTE — Telephone Encounter (Signed)
Patient called requesting to speak to Dr. Sherren Mocha.  I advised him that Dr. Sherren Mocha is out of the office and will be back in next week.  He stated that he has a fecal impaction and needs to see whoever can handle that.  I told him that [name] is at a different location and I would send her a phone not regarding the issue and will call him back.  He said "nevermind", asked for GI and stated he would just go there. I transferred him to GI.

## 2014-09-27 NOTE — ED Notes (Signed)
Bladder scan completed after in and out cath. 58ml. No residual.

## 2014-10-01 DIAGNOSIS — Z471 Aftercare following joint replacement surgery: Secondary | ICD-10-CM | POA: Diagnosis not present

## 2014-10-01 DIAGNOSIS — R262 Difficulty in walking, not elsewhere classified: Secondary | ICD-10-CM | POA: Diagnosis not present

## 2014-10-01 DIAGNOSIS — M25561 Pain in right knee: Secondary | ICD-10-CM | POA: Diagnosis not present

## 2014-10-01 DIAGNOSIS — M25661 Stiffness of right knee, not elsewhere classified: Secondary | ICD-10-CM | POA: Diagnosis not present

## 2014-10-03 ENCOUNTER — Encounter: Payer: Self-pay | Admitting: Endocrinology

## 2014-10-03 ENCOUNTER — Ambulatory Visit (INDEPENDENT_AMBULATORY_CARE_PROVIDER_SITE_OTHER): Payer: Medicare Other | Admitting: Endocrinology

## 2014-10-03 VITALS — BP 136/92 | HR 74 | Temp 97.9°F | Ht 74.0 in | Wt 234.0 lb

## 2014-10-03 DIAGNOSIS — E118 Type 2 diabetes mellitus with unspecified complications: Secondary | ICD-10-CM | POA: Diagnosis not present

## 2014-10-03 NOTE — Patient Instructions (Addendum)
check your blood sugar once a day.  vary the time of day when you check, between before the 3 meals, and at bedtime.  also check if you have symptoms of your blood sugar being too high or too low.  please keep a record of the readings and bring it to your next appointment here.  You can write it on any piece of paper.  please call us sooner if your blood sugar goes below 70, or if you have a lot of readings over 200.   Please stop taking the glipizide.  This may leave you needing to take the insulin every day, which is fine.   Please come back for a follow-up appointment in 2 months.   we will need to take this complex situation in stages.

## 2014-10-03 NOTE — Progress Notes (Signed)
Subjective:    Patient ID: Jonathan [name], male    DOB: 1935-12-25, 79 y.o.   MRN: [id number]  HPI  Pt returns for f/u of diabetes mellitus: DM type: 2 Dx'ed: 7673 Complications: polyneuropathy, CAD and renal insufficiency Therapy: insulin since 2008 DKA: never Severe hypoglycemia: never Pancreatitis: never Other: our plan is to phase out oral meds, and increase insulin as we need to. Interval history: Since the glipizide was reduced, he does not notice any difference in cbg's (he says it varies from 100-169).  It is in general higher as the day goes on.  He still often skips the lantus, out of concern of hypoglycemia.   Past Medical History  Diagnosis Date  . CAD (coronary artery disease)     CABG 2005 by Dr Cyndia Bent  . Hyperlipidemia   . Hypertension   . Hypothyroidism   . Sun-damaged skin   . Benign prostatic hypertrophy   . Gout   . DJD (degenerative joint disease)     bilateral knee, gout  . Hepatitis A 1950  . Hepatitis B 1980's  . Type II diabetes mellitus   . Sleep apnea 1990's    "before uvulectomy"  . GERD (gastroesophageal reflux disease)   . Squamous cell carcinoma     "lots on my arms & face" (09/21/2013)  . Personal history of other infectious and parasitic disease   . Knee joint replacement by other means   . Glomerulonephritis acute     at age 68, due to beta strep, resolved, renal calculi- passed spontaneously  . HOH (hard of hearing)     wears hearing aids  . Incontinence of urine     Past Surgical History  Procedure Laterality Date  . Coronary artery bypass graft  01/23/04    Dr Cyndia Bent  . Inguinal hernia repair Bilateral 1980's - 1990's  . Umbilical hernia repair    . Mastoidectomy Right 1943  . Quadriceps repair Right 2009  . Uvulopalatoplasty  [id number]    ENT MD  . Total knee arthroplasty Left 09/18/2013    Procedure: TOTAL KNEE ARTHROPLASTY;  Surgeon: Hessie Dibble, MD;  Location: [place];  Service: Orthopedics;  Laterality: Left;  . Cataract  extraction w/ intraocular lens  implant, bilateral Bilateral   . Retinal detachment surgery Right X 2  . Cardiac catheterization    . Tonsillectomy  ~ 1943  . Eye surgery Right     detached retina ,blind in right eye  . Total knee arthroplasty Right 08/22/2014    Procedure: RIGHT TOTAL KNEE ARTHROPLASTY;  Surgeon: Hessie Dibble, MD;  Location: [place];  Service: Orthopedics;  Laterality: Right;    History   Social History  . Marital Status: Married    Spouse Name: N/A  . Number of Children: N/A  . Years of Education: N/A   Occupational History  . retired    Social History Main Topics  . Smoking status: Former Smoker -- 1.00 packs/day for 10 years    Types: Cigarettes    Quit date: 09/09/1957  . Smokeless tobacco: Not on file  . Alcohol Use: 8.4 oz/week    14 Shots of liquor per week     Comment: 1 2 oz shots of vodka per night  . Drug Use: No  . Sexual Activity: Not Currently   Other Topics Concern  . Not on file   Social History Narrative   Pt lives in [place].  Retired Magazine features editor (retired 2005)    Current Outpatient  Prescriptions on File Prior to Visit  Medication Sig Dispense Refill  . allopurinol (ZYLOPRIM) 300 MG tablet TAKE 1 TABLET EVERY DAY 90 tablet 3  . aspirin 325 MG EC tablet Take 1 tablet (325 mg total) by mouth 2 (two) times daily. (Patient taking differently: Take 325 mg by mouth daily. ) 30 tablet 0  . cetirizine (ZYRTEC) 10 MG tablet Take 10 mg by mouth daily as needed for allergies.    . furosemide (LASIX) 20 MG tablet Take 20 mg by mouth daily as needed for fluid or edema.    . insulin glargine (LANTUS) 100 UNIT/ML injection Inject 0.3 mLs (30 Units total) into the skin at bedtime. (Patient taking differently: Inject 30 Units into the skin daily as needed (for high glucose level). ) 10 mL 10  . levothyroxine (SYNTHROID, LEVOTHROID) 100 MCG tablet Take 1 tablet (100 mcg total) by mouth every morning. (Patient taking differently: Take 100 mcg by  mouth daily before breakfast. ) 100 tablet 3  . lidocaine (XYLOCAINE) 4 % external solution Apply topically 3 (three) times daily as needed. 50 mL 2  . metformin (FORTAMET) 1000 MG (OSM) 24 hr tablet Take 1,000 mg by mouth 2 (two) times daily with a meal.    . methocarbamol (ROBAXIN) 500 MG tablet Take 1 tablet (500 mg total) by mouth every 6 (six) hours as needed for muscle spasms. 50 tablet 0  . metoprolol succinate (TOPROL-XL) 25 MG 24 hr tablet Take 0.5 tablets (12.5 mg total) by mouth daily. 45 tablet 3  . mirabegron ER (MYRBETRIQ) 50 MG TB24 tablet Take 50 mg by mouth daily before breakfast.     . Multiple Vitamin (MULTIVITAMIN PO) Take 1 tablet by mouth daily.      . niacin 100 MG tablet Take 100 mg by mouth daily.      . nitroGLYCERIN (NITROSTAT) 0.4 MG SL tablet Place 1 tablet (0.4 mg total) under the tongue as needed. (Patient taking differently: Place 0.4 mg under the tongue as needed for chest pain. ) 25 tablet 1  . Omega-3 Fatty Acids (FISH OIL) 1000 MG CAPS Take 1,000 mg by mouth 2 (two) times daily.    [name] Kitchen omeprazole (PRILOSEC) 20 MG capsule Take 1 capsule (20 mg total) by mouth 2 (two) times daily before a meal. (Patient taking differently: Take 20 mg by mouth 2 (two) times daily as needed (acid reflux). ) 180 capsule 3  . oxyCODONE (OXY IR/ROXICODONE) 5 MG immediate release tablet Take 1-2 tablets (5-10 mg total) by mouth every 4 (four) hours as needed for breakthrough pain. 50 tablet 0  . pioglitazone (ACTOS) 45 MG tablet Take 1 tablet (45 mg total) by mouth daily. 90 tablet 3  . polyethylene glycol (MIRALAX / GLYCOLAX) packet Take 17 g by mouth daily as needed for moderate constipation.    . senna (SENOKOT) 8.6 MG tablet Take 1-2 tablets by mouth daily as needed for constipation.    . simvastatin (ZOCOR) 40 MG tablet Take 1 tablet (40 mg total) by mouth at bedtime. 100 tablet 3  . tamsulosin (FLOMAX) 0.4 MG CAPS capsule Take 1 capsule (0.4 mg total) by mouth daily. 10 capsule 0  .  terazosin (HYTRIN) 1 MG capsule Take 1 capsule (1 mg total) by mouth at bedtime as needed (overactive bladder). (Patient taking differently: Take 1 mg by mouth daily. ) 90 capsule 3   No current facility-administered medications on file prior to visit.    Allergies  Allergen Reactions  . Bee  Venom Anaphylaxis    Family History  Problem Relation Age of Onset  . Diabetes Other     1st degree relative  . Hypertension      Family History    BP 136/92 mmHg  Pulse 74  Temp(Src) 97.9 F (36.6 C) (Oral)  Ht 6\' 2"  (1.88 m)  Wt 234 lb (106.142 kg)  BMI 30.03 kg/m2  SpO2 97%  Review of Systems He denies hypoglycemia.      Objective:   Physical Exam VITAL SIGNS:  See vs page GENERAL: no distress. Ext: no edema     Assessment & Plan:  DM: good progress so far.  i discussed plan with patient.   We'll go with insulin, so we'll phase our orals, 1 at a time.    Patient is advised the following: Patient Instructions  check your blood sugar once a day.  vary the time of day when you check, between before the 3 meals, and at bedtime.  also check if you have symptoms of your blood sugar being too high or too low.  please keep a record of the readings and bring it to your next appointment here.  You can write it on any piece of paper.  please call us sooner if your blood sugar goes below 70, or if you have a lot of readings over 200.   Please stop taking the glipizide.  This may leave you needing to take the insulin every day, which is fine.   Please come back for a follow-up appointment in 2 months.   we will need to take this complex situation in stages.

## 2014-10-16 DIAGNOSIS — Z9889 Other specified postprocedural states: Secondary | ICD-10-CM | POA: Diagnosis not present

## 2014-10-17 DIAGNOSIS — R197 Diarrhea, unspecified: Secondary | ICD-10-CM | POA: Diagnosis not present

## 2014-10-17 DIAGNOSIS — R159 Full incontinence of feces: Secondary | ICD-10-CM | POA: Diagnosis not present

## 2014-10-20 NOTE — Progress Notes (Signed)
Patient ID: KALAB CAMPS, male   DOB: Feb 20, 1936, 79 y.o.   MRN: [id number]     [facility]   PCP: TODD,JEFFREY ALLEN, MD   Allergies  Allergen Reactions  . Bee Venom Anaphylaxis    Chief Complaint  Patient presents with  . New Admit To SNF     HPI:  79 year old patient is here for short term rehabilitation post hospital admission from 08/22/14-08/26/14 with right knee OA. He underwent right total knee arthroplasty. He is seen in his room today. He has past medical history of gout, diabetes mellitus, hypothyroidism and hypertension. He complaints of being constipated  Review of Systems:  Constitutional: Negative for fever, chills, malaise/fatigue and diaphoresis.  HENT: Negative for headache, congestion Eyes: Negative for eye pain, blurred vision, double vision and discharge.  Respiratory: Negative for cough, shortness of breath and wheezing.   Cardiovascular: Negative for chest pain, palpitations, leg swelling.  Gastrointestinal: Negative for heartburn, nausea, vomiting, abdominal pain Genitourinary: Negative for dysuria Musculoskeletal: Negative for back pain, falls Skin: Negative for itching, rash.  Neurological: Negative for weakness Psychiatric/Behavioral: Negative for depression, anxiety, insomnia and memory loss.    Past Medical History  Diagnosis Date  . CAD (coronary artery disease)     CABG 2005 by Dr Cyndia Bent  . Hyperlipidemia   . Hypertension   . Hypothyroidism   . Sun-damaged skin   . Benign prostatic hypertrophy   . Gout   . DJD (degenerative joint disease)     bilateral knee, gout  . Hepatitis A 1950  . Hepatitis B 1980's  . Type II diabetes mellitus   . Sleep apnea 1990's    "before uvulectomy"  . GERD (gastroesophageal reflux disease)   . Squamous cell carcinoma     "lots on my arms & face" (09/21/2013)  . Personal history of other infectious and parasitic disease   . Knee joint replacement by other means   .  Glomerulonephritis acute     at age 42, due to beta strep, resolved, renal calculi- passed spontaneously  . HOH (hard of hearing)     wears hearing aids  . Incontinence of urine    Past Surgical History  Procedure Laterality Date  . Coronary artery bypass graft  01/23/04    Dr Cyndia Bent  . Inguinal hernia repair Bilateral 1980's - 1990's  . Umbilical hernia repair    . Mastoidectomy Right 1943  . Quadriceps repair Right 2009  . Uvulopalatoplasty  [id number]    ENT MD  . Total knee arthroplasty Left 09/18/2013    Procedure: TOTAL KNEE ARTHROPLASTY;  Surgeon: Hessie Dibble, MD;  Location: [place];  Service: Orthopedics;  Laterality: Left;  . Cataract extraction w/ intraocular lens  implant, bilateral Bilateral   . Retinal detachment surgery Right X 2  . Cardiac catheterization    . Tonsillectomy  ~ 1943  . Eye surgery Right     detached retina ,blind in right eye  . Total knee arthroplasty Right 08/22/2014    Procedure: RIGHT TOTAL KNEE ARTHROPLASTY;  Surgeon: Hessie Dibble, MD;  Location: [place];  Service: Orthopedics;  Laterality: Right;   Social History:   reports that he quit smoking about 57 years ago. His smoking use included Cigarettes. He has a 10 pack-year smoking history. He does not have any smokeless tobacco history on file. He reports that he drinks about 8.4 oz of alcohol per week. He reports that he does not use illicit drugs.  Family History  Problem Relation Age of Onset  . Diabetes Other     1st degree relative  . Hypertension      Family History    Medications: Medication reviewed. See MAR  Physical Exam: Filed Vitals:   08/29/14 1556  BP: 140/87  Pulse: 87  Temp: 98.2 F (36.8 C)  Resp: 18  SpO2: 94%    General- elderly male, well built, in no acute distress Head- normocephalic, atraumatic Throat- moist mucus membrane Cardiovascular- normal s1,s2, no murmurs, palpable dorsalis pedis, right leg edema Respiratory- bilateral clear to auscultation, no  wheeze, no rhonchi, no crackles, no use of accessory muscles Abdomen- bowel sounds present, soft, non tender Musculoskeletal- able to move all 4 extremities, right knee range of motion limited, ted hose present Neurological- no focal deficit Skin- warm and dry, aquacel dressing to right knee, bruise on right thigh noted Psychiatry- alert and oriented to person, place and time, normal mood and affect    Labs reviewed: Basic Metabolic Panel:  Recent Labs  08/25/14 0535 08/26/14 0525 09/02/14 0857  NA 134* 135 140  K 3.7 3.4* 4.1  CL 98 98 104  CO2 26 32 25  GLUCOSE 79 131* 112*  BUN 25* 13 18  CREATININE 2.18* 1.28 1.01  CALCIUM 7.9* 8.2* 9.5   Liver Function Tests:  Recent Labs  12/04/13 1103 08/22/14 1306  AST 22 31  ALT 14 20  ALKPHOS 53 52  BILITOT 0.8 0.7  PROT 6.3 6.8  ALBUMIN 4.1 4.3   No results for input(s): LIPASE, AMYLASE in the last 8760 hours. No results for input(s): AMMONIA in the last 8760 hours. CBC:  Recent Labs  12/04/13 1103 08/14/14 0854 08/23/14 0539 08/24/14 0432 08/25/14 0535  WBC 5.9 4.0 5.9 9.5 8.3  NEUTROABS 3.8 2.3  --   --   --   HGB 14.6 14.6 12.4* 11.3* 9.6*  HCT 43.0 42.8 36.1* 33.6* 28.1*  MCV 92.4 91.3 93.5 94.6 91.8  PLT 208.0 167 156 171 142*   Cardiac Enzymes: No results for input(s): CKTOTAL, CKMB, CKMBINDEX, TROPONINI in the last 8760 hours. BNP: Invalid input(s): POCBNP CBG:  Recent Labs  08/25/14 2130 08/26/14 0643 08/26/14 1211  GLUCAP 180* 114* 205*    Assessment/Plan  Osteoarthritis  S/P right total knee arthroplasty. Will have him work with physical therapy and occupational therapy team to help with gait training and muscle strengthening exercises.fall precautions. Skin care. Encourage to be out of bed. Continue aspirin 325 mg EC bid for DVT prophylaxis, oxycodone 5 mg 1-2 tabs every 4 hours prn pain and Robaxin 500 mg every 6 hours prn for muscle spasm. Continue to wear ted  hose.  Constipation Continue senna s 2 tab bid and miralax bid for now, started 2 days back, add dulcolax suppository x 1 now.  Diabetes mellitus, type II continue Glucotrol 10 mg bid and Actos 45 mg daily with Lantus 30 units daily. Monitor cbg  UTI  continue and complete Levaquin 500 mg daily, encouraged hydration, currently asymptomatic  Leg edema Continue lasix 20 mg daily, ted hose and monitor bmp  Hypothyroidism continue Synthroid 100 mcg daily  Hypertension continue Toprol-XL 12.5 mg daily, monitor bp  gerd Continue prilosec 20 mg bid  Urinary incontinence continue Myrbetriq 50 mg daily  Hyperlipidemia continue Zocor 40 mg daily  BPH continue Flomax 0.4 mg daily   Goals of care: short term rehabilitation   Family/ staff Communication: reviewed care plan with patient and nursing supervisor  Blanchie Serve, MD  [facility] [phone] (Monday-Friday 8 am - 5 pm) [phone] (afterhours)

## 2014-10-23 ENCOUNTER — Encounter: Payer: Self-pay | Admitting: *Deleted

## 2014-11-23 ENCOUNTER — Other Ambulatory Visit: Payer: Self-pay | Admitting: Family Medicine

## 2014-12-02 ENCOUNTER — Ambulatory Visit: Payer: Medicare Other | Admitting: Endocrinology

## 2014-12-09 ENCOUNTER — Other Ambulatory Visit: Payer: Self-pay | Admitting: Family Medicine

## 2014-12-10 ENCOUNTER — Encounter: Payer: Self-pay | Admitting: Family Medicine

## 2014-12-10 ENCOUNTER — Ambulatory Visit (INDEPENDENT_AMBULATORY_CARE_PROVIDER_SITE_OTHER): Payer: Medicare Other | Admitting: Family Medicine

## 2014-12-10 VITALS — BP 118/72 | Ht 74.5 in | Wt 243.0 lb

## 2014-12-10 DIAGNOSIS — E039 Hypothyroidism, unspecified: Secondary | ICD-10-CM

## 2014-12-10 DIAGNOSIS — Z0000 Encounter for general adult medical examination without abnormal findings: Secondary | ICD-10-CM

## 2014-12-10 DIAGNOSIS — N400 Enlarged prostate without lower urinary tract symptoms: Secondary | ICD-10-CM

## 2014-12-10 DIAGNOSIS — M109 Gout, unspecified: Secondary | ICD-10-CM

## 2014-12-10 DIAGNOSIS — E1110 Type 2 diabetes mellitus with ketoacidosis without coma: Secondary | ICD-10-CM

## 2014-12-10 DIAGNOSIS — E1310 Other specified diabetes mellitus with ketoacidosis without coma: Secondary | ICD-10-CM | POA: Diagnosis not present

## 2014-12-10 DIAGNOSIS — I25810 Atherosclerosis of coronary artery bypass graft(s) without angina pectoris: Secondary | ICD-10-CM

## 2014-12-10 DIAGNOSIS — I10 Essential (primary) hypertension: Secondary | ICD-10-CM

## 2014-12-10 DIAGNOSIS — Z23 Encounter for immunization: Secondary | ICD-10-CM

## 2014-12-10 DIAGNOSIS — E118 Type 2 diabetes mellitus with unspecified complications: Secondary | ICD-10-CM

## 2014-12-10 DIAGNOSIS — E785 Hyperlipidemia, unspecified: Secondary | ICD-10-CM

## 2014-12-10 LAB — POCT URINALYSIS DIPSTICK
Bilirubin, UA: NEGATIVE
Blood, UA: NEGATIVE
Glucose, UA: NEGATIVE
KETONES UA: NEGATIVE
Leukocytes, UA: NEGATIVE
NITRITE UA: NEGATIVE
Protein, UA: NEGATIVE
Spec Grav, UA: 1.02
UROBILINOGEN UA: 0.2
pH, UA: 5.5

## 2014-12-10 LAB — BASIC METABOLIC PANEL
BUN: 14 mg/dL (ref 6–23)
CHLORIDE: 103 meq/L (ref 96–112)
CO2: 29 mEq/L (ref 19–32)
CREATININE: 1.06 mg/dL (ref 0.40–1.50)
Calcium: 9.5 mg/dL (ref 8.4–10.5)
GFR: 72.07 mL/min (ref >60.00)
Glucose, Bld: 161 mg/dL — ABNORMAL HIGH (ref 70–99)
Potassium: 4.3 mEq/L (ref 3.5–5.1)
Sodium: 138 mEq/L (ref 135–145)

## 2014-12-10 LAB — HEPATIC FUNCTION PANEL
ALK PHOS: 49 U/L (ref 39–117)
ALT: 12 U/L (ref 0–53)
AST: 18 U/L (ref 0–37)
Albumin: 3.9 g/dL (ref 3.5–5.2)
Bilirubin, Direct: 0.2 mg/dL (ref 0.0–0.3)
Total Bilirubin: 0.7 mg/dL (ref 0.2–1.2)
Total Protein: 6.1 g/dL (ref 6.0–8.3)

## 2014-12-10 LAB — PSA: PSA: 2.27 ng/mL (ref 0.10–4.00)

## 2014-12-10 LAB — CBC WITH DIFFERENTIAL/PLATELET
Basophils Absolute: 0 10*3/uL (ref 0.0–0.1)
Basophils Relative: 0.6 % (ref 0.0–3.0)
EOS PCT: 1.9 % (ref 0.0–5.0)
Eosinophils Absolute: 0.1 10*3/uL (ref 0.0–0.7)
HEMATOCRIT: 40 % (ref 39.0–52.0)
HEMOGLOBIN: 13.7 g/dL (ref 13.0–17.0)
LYMPHS PCT: 26.4 % (ref 12.0–46.0)
Lymphs Abs: 1.1 10*3/uL (ref 0.7–4.0)
MCHC: 34.1 g/dL (ref 30.0–36.0)
MCV: 90.6 fl (ref 78.0–100.0)
MONOS PCT: 9.9 % (ref 3.0–12.0)
Monocytes Absolute: 0.4 10*3/uL (ref 0.1–1.0)
Neutro Abs: 2.5 10*3/uL (ref 1.4–7.7)
Neutrophils Relative %: 61.2 % (ref 43.0–77.0)
Platelets: 185 10*3/uL (ref 150.0–400.0)
RBC: 4.42 Mil/uL (ref 4.22–5.81)
RDW: 15.2 % (ref 11.5–15.5)
WBC: 4.1 10*3/uL (ref 4.0–10.5)

## 2014-12-10 LAB — MICROALBUMIN / CREATININE URINE RATIO
CREATININE, U: 107.5 mg/dL
MICROALB/CREAT RATIO: 0.7 mg/g (ref 0.0–30.0)
Microalb, Ur: 0.7 mg/dL (ref 0.0–1.9)

## 2014-12-10 LAB — LIPID PANEL
CHOLESTEROL: 131 mg/dL (ref 0–200)
HDL: 52.1 mg/dL (ref >39.00)
LDL Cholesterol: 59 mg/dL (ref 0–99)
NonHDL: 78.9
Total CHOL/HDL Ratio: 3
Triglycerides: 98 mg/dL (ref 0.0–149.0)
VLDL: 19.6 mg/dL (ref 0.0–40.0)

## 2014-12-10 LAB — TSH: TSH: 2.92 u[IU]/mL (ref 0.35–4.50)

## 2014-12-10 LAB — HEMOGLOBIN A1C: HEMOGLOBIN A1C: 7 % — AB (ref 4.6–6.5)

## 2014-12-10 MED ORDER — INSULIN GLARGINE 100 UNIT/ML ~~LOC~~ SOLN: 30.0000 [IU] | Freq: Every day | SUBCUTANEOUS | Status: DC

## 2014-12-10 MED ORDER — TAMSULOSIN HCL 0.4 MG PO CAPS: 0.4000 mg | ORAL_CAPSULE | Freq: Every day | ORAL | Status: DC

## 2014-12-10 MED ORDER — METOPROLOL SUCCINATE ER 25 MG PO TB24: 12.5000 mg | ORAL_TABLET | Freq: Every day | ORAL | Status: DC

## 2014-12-10 MED ORDER — GLIPIZIDE 5 MG PO TABS: 5.0000 mg | ORAL_TABLET | Freq: Two times a day (BID) | ORAL | Status: DC

## 2014-12-10 MED ORDER — TERAZOSIN HCL 1 MG PO CAPS: 1.0000 mg | ORAL_CAPSULE | Freq: Every evening | ORAL | Status: DC | PRN

## 2014-12-10 MED ORDER — PIOGLITAZONE HCL 45 MG PO TABS: 45.0000 mg | ORAL_TABLET | Freq: Every day | ORAL | Status: DC

## 2014-12-10 MED ORDER — LEVOTHYROXINE SODIUM 100 MCG PO TABS: 100.0000 ug | ORAL_TABLET | Freq: Every morning | ORAL | Status: DC

## 2014-12-10 MED ORDER — SIMVASTATIN 40 MG PO TABS: 40.0000 mg | ORAL_TABLET | Freq: Every day | ORAL | Status: DC

## 2014-12-10 MED ORDER — NITROGLYCERIN 0.4 MG SL SUBL: 0.4000 mg | SUBLINGUAL_TABLET | SUBLINGUAL | Status: DC | PRN

## 2014-12-10 MED ORDER — METFORMIN HCL 1000 MG PO TABS: ORAL_TABLET | ORAL | Status: DC

## 2014-12-10 MED ORDER — FUROSEMIDE 20 MG PO TABS: 20.0000 mg | ORAL_TABLET | Freq: Every day | ORAL | Status: DC | PRN

## 2014-12-10 MED ORDER — ALLOPURINOL 300 MG PO TABS: 300.0000 mg | ORAL_TABLET | Freq: Every day | ORAL | Status: DC

## 2014-12-10 NOTE — Progress Notes (Signed)
Subjective:    Patient ID: Jonathan [name], male    DOB: 07-08-1936, 79 y.o.   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 It was complicated by a fever postop. Workup turned out to be negative. He got off the pain medication and for 5 days but then took a couple at bedtime and developed severe constipation. He had to be admitted to be manually disimpacted.  During his hospital stay they took him off the glipizide and Actos and wanted him to manage his diabetes with insulin and metformin alone. They sent him to see Dr. Loanne [name]. His blood sugar went up in the 250 range he cannot control his blood sugar with insulin swelling back on the Actos and the glipizide.  The rest of his med list was reviewed its correct.  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 Colonoscopy 9 years ago normal  Vaccinations updated by Jonathan [name]  Cognitive function normal he walks daily home health safety reviewed no issues identified, no guns in the house, he does have a healthcare power of attorney and living well   Review of Systems  Constitutional: Negative.   HENT: Negative.   Eyes: Negative.   Respiratory: Negative.   Cardiovascular: Negative.   Gastrointestinal: Negative.   Endocrine: Negative.   Genitourinary: Negative.   Musculoskeletal: Negative.   Skin: Negative.   Allergic/Immunologic: Negative.   Neurological: Negative.   Hematological: Negative.   Psychiatric/Behavioral: Negative.        Objective:   Physical Exam  Constitutional: He is oriented to person, place, and time. He appears well-developed and well-nourished.  HENT:  Head: Normocephalic and  atraumatic.  Right Ear: External ear normal.  Left Ear: External ear normal.  Nose: Nose normal.  Mouth/Throat: Oropharynx is clear and moist.  Eyes: Conjunctivae and EOM are normal. Pupils are equal, round, and reactive to light.  Neck: Normal range of motion. Neck supple. No JVD present. No tracheal deviation present. No thyromegaly present.  Cardiovascular: Normal rate, regular rhythm, normal heart sounds and intact distal pulses.  Exam reveals no gallop and no friction rub.   No murmur heard. No carotid nor aortic bruits peripheral pulses 1+ and symmetrical  Pulmonary/Chest: Effort normal and breath sounds normal. No stridor. No respiratory distress. He has no wheezes. He has no rales. He exhibits no tenderness.  Abdominal: Soft. Bowel sounds are normal. He exhibits no distension and no mass. There is no tenderness. There is no rebound and no guarding.  Genitourinary:  Genitorectal exam done by urologist therefore not repeated  Musculoskeletal: Normal range of motion. He exhibits no edema or tenderness.  Lymphadenopathy:    He has no cervical adenopathy.  Neurological: He is alert and oriented to person, place, and time. He has normal reflexes. No cranial nerve deficit. He exhibits normal muscle tone.  Skin: Skin is warm and dry. No rash noted. No erythema. No pallor.  Total body skin exam normal except for a dark lesion left posterior shoulder advised to see his dermatologist for removal ASAP  Psychiatric: He has a normal mood and affect. His behavior is normal. Judgment and thought content normal.  Nursing note and vitals reviewed.  Assessment & Plan:  Diabetes type 2.......... check labs  Hypertension at goal BP 118/72....... continue current medications  Hyperlipidemia,,,,,,, continue Zocor and aspirin check labs  Coronary artery disease....... asymptomatic followed in cardiology  BPH with outlet obstruction....... continue Hytrin and Flomax,,,,,,,,, followed by  urologist  Status post left now of right total knee replacements.  History gout..... Asymptomatic on allopurinol daily

## 2014-12-10 NOTE — Patient Instructions (Signed)
Labs today  We will call you the report  Continue current medicines  Rachel's extension is [place],,,,,,,,,,,, our new adult Designer, jewellery from [place]

## 2014-12-10 NOTE — Progress Notes (Signed)
Pre visit review using our clinic review tool, if applicable. No additional management support is needed unless otherwise documented below in the visit note. 

## 2014-12-17 ENCOUNTER — Ambulatory Visit (INDEPENDENT_AMBULATORY_CARE_PROVIDER_SITE_OTHER): Payer: Medicare Other | Admitting: Family Medicine

## 2014-12-17 DIAGNOSIS — D2260 Melanocytic nevi of unspecified upper limb, including shoulder: Secondary | ICD-10-CM | POA: Insufficient documentation

## 2014-12-17 DIAGNOSIS — D2262 Melanocytic nevi of left upper limb, including shoulder: Secondary | ICD-10-CM | POA: Diagnosis not present

## 2014-12-17 DIAGNOSIS — L820 Inflamed seborrheic keratosis: Secondary | ICD-10-CM | POA: Diagnosis not present

## 2014-12-17 MED ORDER — LORAZEPAM 0.5 MG PO TABS: 0.5000 mg | ORAL_TABLET | Freq: Two times a day (BID) | ORAL | Status: DC | PRN

## 2014-12-17 NOTE — Progress Notes (Signed)
   Subjective:    Patient ID: Jonathan [name], male    DOB: 1935/09/10, 80 y.o.   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  We saw him last week for general physical examination. We did a complete skin exam. He's had squamous cell carcinomas of his arms. I noticed a black lesion on his left shoulder. He's here for removal   Review of Systems Review of systems positive needs had skin cancer    Objective:   Physical Exam  Well-developed well-nourished male no acute distress vital signs stable he is afebrile examination shoulder 75 mm slightly elevated black lesion left shoulder  After informed consent lesion was cleaned with alcohol, anesthetized with 1% Xylocaine with epinephrine, removed with 3 mm margins and deep deep excision. Lesion was sent for pathologic analysis the base was cauterized Band-Aid was applied      Assessment & Plan:  Dysplastic atypical type lesion left shoulder......... question skin cancer....... path pending

## 2014-12-17 NOTE — Patient Instructions (Signed)
Remove the Band-Aid tomorrow. We will call her the report ASAP

## 2014-12-24 ENCOUNTER — Ambulatory Visit: Payer: Medicare Other | Admitting: Family Medicine

## 2015-01-15 DIAGNOSIS — Z96653 Presence of artificial knee joint, bilateral: Secondary | ICD-10-CM | POA: Diagnosis not present

## 2015-03-05 DIAGNOSIS — N401 Enlarged prostate with lower urinary tract symptoms: Secondary | ICD-10-CM | POA: Diagnosis not present

## 2015-03-05 DIAGNOSIS — R350 Frequency of micturition: Secondary | ICD-10-CM | POA: Diagnosis not present

## 2015-03-05 DIAGNOSIS — D3002 Benign neoplasm of left kidney: Secondary | ICD-10-CM | POA: Diagnosis not present

## 2015-03-05 DIAGNOSIS — N138 Other obstructive and reflux uropathy: Secondary | ICD-10-CM | POA: Diagnosis not present

## 2015-04-08 ENCOUNTER — Other Ambulatory Visit: Payer: Self-pay | Admitting: Family Medicine

## 2015-05-14 ENCOUNTER — Encounter: Payer: Self-pay | Admitting: Internal Medicine

## 2015-05-14 ENCOUNTER — Ambulatory Visit (INDEPENDENT_AMBULATORY_CARE_PROVIDER_SITE_OTHER): Payer: Medicare Other | Admitting: Internal Medicine

## 2015-05-14 ENCOUNTER — Other Ambulatory Visit: Payer: Self-pay

## 2015-05-14 VITALS — BP 128/78 | HR 63 | Ht 74.5 in | Wt 247.8 lb

## 2015-05-14 DIAGNOSIS — I10 Essential (primary) hypertension: Secondary | ICD-10-CM | POA: Diagnosis not present

## 2015-05-14 DIAGNOSIS — I25810 Atherosclerosis of coronary artery bypass graft(s) without angina pectoris: Secondary | ICD-10-CM | POA: Diagnosis not present

## 2015-05-14 DIAGNOSIS — I440 Atrioventricular block, first degree: Secondary | ICD-10-CM

## 2015-05-14 MED ORDER — METOPROLOL SUCCINATE ER 25 MG PO TB24: 12.5000 mg | ORAL_TABLET | Freq: Every day | ORAL | Status: DC

## 2015-05-14 NOTE — Progress Notes (Signed)
PCP: Joycelyn Man, MD  Jonathan [name] is a 79 y.o. male who presents today for routine cardiology followup.  Since last being seen in our clinic, the patient reports doing very well.  He remains active. Today, he denies symptoms of palpitations, chest pain, shortness of breath,  lower extremity edema, dizziness, presyncope, or syncope.  The patient is otherwise without complaint today.   Past Medical History  Diagnosis Date  . CAD (coronary artery disease)     CABG 2005 by Dr Cyndia Bent  . Hyperlipidemia   . Hypertension   . Hypothyroidism   . Sun-damaged skin   . Benign prostatic hypertrophy   . Gout   . DJD (degenerative joint disease)     bilateral knee, gout  . Hepatitis A 1950  . Hepatitis B 1980's  . Type II diabetes mellitus ([place])   . Sleep apnea 1990's    "before uvulectomy"  . GERD (gastroesophageal reflux disease)   . Squamous cell carcinoma ([place])     "lots on my arms & face" (09/21/2013)  . Personal history of other infectious and parasitic disease   . Knee joint replacement by other means   . Glomerulonephritis acute     at age 89, due to beta strep, resolved, renal calculi- passed spontaneously  . HOH (hard of hearing)     wears hearing aids  . Incontinence of urine    Past Surgical History  Procedure Laterality Date  . Coronary artery bypass graft  01/23/04    Dr Cyndia Bent  . Inguinal hernia repair Bilateral 1980's - 1990's  . Umbilical hernia repair    . Mastoidectomy Right 1943  . Quadriceps repair Right 2009  . Uvulopalatoplasty  [id number]    ENT MD  . Total knee arthroplasty Left 09/18/2013    Procedure: TOTAL KNEE ARTHROPLASTY;  Surgeon: Hessie Dibble, MD;  Location: [facility];  Service: Orthopedics;  Laterality: Left;  . Cataract extraction w/ intraocular lens  implant, bilateral Bilateral   . Retinal detachment surgery Right X 2  . Cardiac catheterization    . Tonsillectomy  ~ 1943  . Eye surgery Right     detached retina ,blind in right eye  . Total  knee arthroplasty Right 08/22/2014    Procedure: RIGHT TOTAL KNEE ARTHROPLASTY;  Surgeon: Hessie Dibble, MD;  Location: [facility];  Service: Orthopedics;  Laterality: Right;    Current Outpatient Prescriptions  Medication Sig Dispense Refill  . allopurinol (ZYLOPRIM) 300 MG tablet Take 1 tablet (300 mg total) by mouth daily. 90 tablet 3  . aspirin 325 MG tablet Take 325 mg by mouth daily.    . benazepril-hydrochlorthiazide (LOTENSIN HCT) 20-25 MG per tablet TAKE 1 TABLET EVERY DAY 90 tablet 3  . cetirizine (ZYRTEC) 10 MG tablet Take 10 mg by mouth daily as needed for allergies.    [name] Kitchen FREESTYLE LITE test strip USE TWICE A DAY AS DIRECTED 100 each 11  . furosemide (LASIX) 20 MG tablet Take 1 tablet (20 mg total) by mouth daily as needed for fluid or edema. 100 tablet 3  . glipiZIDE (GLUCOTROL) 5 MG tablet Take 1 tablet (5 mg total) by mouth 2 (two) times daily. 200 tablet 3  . insulin glargine (LANTUS) 100 UNIT/ML injection Inject 0.3 mLs (30 Units total) into the skin at bedtime. 10 mL 10  . levothyroxine (SYNTHROID, LEVOTHROID) 100 MCG tablet Take 1 tablet (100 mcg total) by mouth every morning. 90 tablet 3  . lidocaine (XYLOCAINE) 4 % external solution  Apply topically 3 (three) times daily as needed. 50 mL 2  . LORazepam (ATIVAN) 0.5 MG tablet Take 1 tablet (0.5 mg total) by mouth 2 (two) times daily as needed for anxiety. 60 tablet 2  . metFORMIN (GLUCOPHAGE) 1000 MG tablet TAKE 1 TABLET TWICE DAILY WITH A MEAL 200 tablet 3  . methocarbamol (ROBAXIN) 500 MG tablet Take 1 tablet (500 mg total) by mouth every 6 (six) hours as needed for muscle spasms. 50 tablet 0  . metoprolol succinate (TOPROL-XL) 25 MG 24 hr tablet Take 0.5 tablets (12.5 mg total) by mouth daily. 45 tablet 3  . mirabegron ER (MYRBETRIQ) 50 MG TB24 tablet Take 50 mg by mouth daily before breakfast.     . Multiple Vitamin (MULTIVITAMIN PO) Take 1 tablet by mouth daily.      . niacin 100 MG tablet Take 100 mg by mouth daily.       . nitroGLYCERIN (NITROSTAT) 0.4 MG SL tablet Place 1 tablet (0.4 mg total) under the tongue as needed. 90 tablet 1  . Omega-3 Fatty Acids (FISH OIL) 1000 MG CAPS Take 1,000 mg by mouth 2 (two) times daily.    [name] Kitchen omeprazole (PRILOSEC) 20 MG capsule Take 20 mg by mouth daily as needed (heartburn or acid reflux).    [name] Kitchen oxybutynin (DITROPAN XL) 15 MG 24 hr tablet Take 1 tablet by mouth daily.    [name] Kitchen oxyCODONE (OXY IR/ROXICODONE) 5 MG immediate release tablet Take 1-2 tablets (5-10 mg total) by mouth every 4 (four) hours as needed for breakthrough pain. 50 tablet 0  . pioglitazone (ACTOS) 45 MG tablet Take 1 tablet (45 mg total) by mouth daily. 90 tablet 3  . polyethylene glycol (MIRALAX / GLYCOLAX) packet Take 17 g by mouth daily as needed for moderate constipation.    . senna (SENOKOT) 8.6 MG tablet Take 1-2 tablets by mouth daily as needed for constipation.    . simvastatin (ZOCOR) 40 MG tablet Take 1 tablet (40 mg total) by mouth at bedtime. 100 tablet 3  . tamsulosin (FLOMAX) 0.4 MG CAPS capsule Take 1 capsule (0.4 mg total) by mouth daily. 90 capsule 3   No current facility-administered medications for this visit.   ROS- all systems are reviewed and negative except as per HPI above  Physical Exam: Filed Vitals:   05/14/15 1117  BP: 128/78  Pulse: 63  Height: 6' 2.5" (1.892 m)  Weight: 247 lb 12.8 oz (112.401 kg)    GEN- The patient is well appearing, alert and oriented x 3 today.   Head- normocephalic, atraumatic Eyes-  Sclera clear, conjunctiva pink Ears- hearing intact Oropharynx- clear Lungs- Clear to ausculation bilaterally, normal work of breathing Heart- Regular rate and rhythm, no murmurs, rubs or gallops, PMI not laterally displaced GI- soft, NT, ND, + BS Extremities- no clubbing, cyanosis, or edema  ekg today reveals sinus rhythm 63 bpm, PR 292 msec, nonspecific ST/T changes Echo is reviewed from 2015  Assessment and Plan:  1. CAD Stable, without ischemic symptoms No  further workup at this time I have encouraged him to reduce asa to 81mg  daily but he is reluctant to do so  2. First degree AV block Previously improved with reduction of toprol from 25mg  daily to 12.5mg  daily.  He thinks that he has accidentally increased toprol back to 25mg  daily. He will reduce this again to 12.5mg  daily today.  No symptoms of more advanced AV block and no indication for pacing at this time  3.  HL  Recent lipids are reviewed  4. HTN Some dizziness with hctz Adequate hydration encouraged  Return in 1 year He should contact my office if problems arise in the interim  Thompson Grayer MD, [facility] 05/14/2015 12:05 PM

## 2015-05-14 NOTE — Patient Instructions (Signed)
Medication Instructions:  Your physician has recommended you make the following change in your medication:  1) Decrease Aspirin to 81 mg daily    Labwork: None ordered   Testing/Procedures: None ordered   Follow-Up: Your physician wants you to follow-up in: 12 months with Dr Vallery Ridge will receive a reminder letter in the mail two months in advance. If you don't receive a letter, please call our office to schedule the follow-up appointment.   Any Other Special Instructions Will Be Listed Below (If Applicable).

## 2015-05-19 DIAGNOSIS — Z961 Presence of intraocular lens: Secondary | ICD-10-CM | POA: Diagnosis not present

## 2015-05-19 DIAGNOSIS — E119 Type 2 diabetes mellitus without complications: Secondary | ICD-10-CM | POA: Diagnosis not present

## 2015-05-19 LAB — HM DIABETES EYE EXAM

## 2015-06-09 DIAGNOSIS — Z23 Encounter for immunization: Secondary | ICD-10-CM | POA: Diagnosis not present

## 2015-07-02 ENCOUNTER — Encounter: Payer: Self-pay | Admitting: Family Medicine

## 2015-07-18 DIAGNOSIS — M25562 Pain in left knee: Secondary | ICD-10-CM | POA: Diagnosis not present

## 2015-07-18 DIAGNOSIS — M25561 Pain in right knee: Secondary | ICD-10-CM | POA: Diagnosis not present

## 2015-08-07 ENCOUNTER — Encounter: Payer: Self-pay | Admitting: *Deleted

## 2015-09-22 ENCOUNTER — Other Ambulatory Visit: Payer: Self-pay | Admitting: Family Medicine

## 2015-09-22 NOTE — Telephone Encounter (Signed)
Rx sent and appointment letter mailed to the pt.  

## 2015-09-22 NOTE — Telephone Encounter (Signed)
Please refill x 1 Ov is due  

## 2015-11-18 ENCOUNTER — Other Ambulatory Visit: Payer: Self-pay | Admitting: Endocrinology

## 2015-11-24 ENCOUNTER — Other Ambulatory Visit: Payer: Self-pay | Admitting: Family Medicine

## 2015-12-19 ENCOUNTER — Telehealth: Payer: Self-pay | Admitting: Family Medicine

## 2015-12-19 ENCOUNTER — Ambulatory Visit (INDEPENDENT_AMBULATORY_CARE_PROVIDER_SITE_OTHER): Payer: Medicare Other | Admitting: Adult Health

## 2015-12-19 ENCOUNTER — Encounter: Payer: Self-pay | Admitting: Adult Health

## 2015-12-19 VITALS — BP 106/70 | Temp 98.4°F | Ht 74.5 in | Wt 247.6 lb

## 2015-12-19 DIAGNOSIS — E118 Type 2 diabetes mellitus with unspecified complications: Secondary | ICD-10-CM

## 2015-12-19 DIAGNOSIS — I10 Essential (primary) hypertension: Secondary | ICD-10-CM | POA: Diagnosis not present

## 2015-12-19 DIAGNOSIS — E785 Hyperlipidemia, unspecified: Secondary | ICD-10-CM | POA: Diagnosis not present

## 2015-12-19 DIAGNOSIS — N400 Enlarged prostate without lower urinary tract symptoms: Secondary | ICD-10-CM

## 2015-12-19 DIAGNOSIS — Z0000 Encounter for general adult medical examination without abnormal findings: Secondary | ICD-10-CM | POA: Diagnosis not present

## 2015-12-19 DIAGNOSIS — I440 Atrioventricular block, first degree: Secondary | ICD-10-CM

## 2015-12-19 DIAGNOSIS — E038 Other specified hypothyroidism: Secondary | ICD-10-CM | POA: Diagnosis not present

## 2015-12-19 LAB — CBC WITH DIFFERENTIAL/PLATELET
BASOS PCT: 0.4 % (ref 0.0–3.0)
Basophils Absolute: 0 10*3/uL (ref 0.0–0.1)
EOS ABS: 0 10*3/uL (ref 0.0–0.7)
EOS PCT: 1.1 % (ref 0.0–5.0)
HEMATOCRIT: 41.2 % (ref 39.0–52.0)
HEMOGLOBIN: 13.8 g/dL (ref 13.0–17.0)
LYMPHS PCT: 28.6 % (ref 12.0–46.0)
Lymphs Abs: 1.2 10*3/uL (ref 0.7–4.0)
MCHC: 33.5 g/dL (ref 30.0–36.0)
MCV: 93.5 fl (ref 78.0–100.0)
MONOS PCT: 10.2 % (ref 3.0–12.0)
Monocytes Absolute: 0.4 10*3/uL (ref 0.1–1.0)
Neutro Abs: 2.5 10*3/uL (ref 1.4–7.7)
Neutrophils Relative %: 59.7 % (ref 43.0–77.0)
Platelets: 189 10*3/uL (ref 150.0–400.0)
RBC: 4.41 Mil/uL (ref 4.22–5.81)
RDW: 14.9 % (ref 11.5–15.5)
WBC: 4.3 10*3/uL (ref 4.0–10.5)

## 2015-12-19 LAB — PSA: PSA: 2.38 ng/mL (ref 0.10–4.00)

## 2015-12-19 LAB — POC URINALSYSI DIPSTICK (AUTOMATED)
Bilirubin, UA: NEGATIVE
Blood, UA: NEGATIVE
Glucose, UA: NEGATIVE
Ketones, UA: NEGATIVE
LEUKOCYTES UA: NEGATIVE
NITRITE UA: NEGATIVE
PH UA: 6
PROTEIN UA: NEGATIVE
Spec Grav, UA: 1.025
Urobilinogen, UA: 0.2

## 2015-12-19 LAB — BASIC METABOLIC PANEL
BUN: 17 mg/dL (ref 6–23)
CHLORIDE: 105 meq/L (ref 96–112)
CO2: 27 mEq/L (ref 19–32)
Calcium: 9.1 mg/dL (ref 8.4–10.5)
Creatinine, Ser: 1.01 mg/dL (ref 0.40–1.50)
GFR: 75.99 mL/min (ref >60.00)
Glucose, Bld: 130 mg/dL — ABNORMAL HIGH (ref 70–99)
POTASSIUM: 4.2 meq/L (ref 3.5–5.1)
SODIUM: 142 meq/L (ref 135–145)

## 2015-12-19 LAB — HEPATIC FUNCTION PANEL
ALT: 19 U/L (ref 0–53)
AST: 23 U/L (ref 0–37)
Albumin: 4.3 g/dL (ref 3.5–5.2)
Alkaline Phosphatase: 47 U/L (ref 39–117)
BILIRUBIN TOTAL: 0.6 mg/dL (ref 0.2–1.2)
Bilirubin, Direct: 0.2 mg/dL (ref 0.0–0.3)
Total Protein: 6.1 g/dL (ref 6.0–8.3)

## 2015-12-19 LAB — TSH: TSH: 2.08 u[IU]/mL (ref 0.35–4.50)

## 2015-12-19 LAB — LIPID PANEL
CHOLESTEROL: 135 mg/dL (ref 0–200)
HDL: 44.2 mg/dL (ref >39.00)
LDL Cholesterol: 77 mg/dL (ref 0–99)
NonHDL: 91.19
Total CHOL/HDL Ratio: 3
Triglycerides: 71 mg/dL (ref 0.0–149.0)
VLDL: 14.2 mg/dL (ref 0.0–40.0)

## 2015-12-19 LAB — HEMOGLOBIN A1C: Hgb A1c MFr Bld: 7 % — ABNORMAL HIGH (ref 4.6–6.5)

## 2015-12-19 MED ORDER — ALIGN 4 MG PO CAPS: 4.0000 mg | ORAL_CAPSULE | Freq: Every day | ORAL | Status: AC

## 2015-12-19 NOTE — Progress Notes (Signed)
Subjective:  Patient presents today for their annual wellness visit.    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  He gets routine eye care..........[name] Kitchen. 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 regular dental care.... Colonoscopy in 2007.    His diabetes is well controlled on current medication His blood sugar at home was 78.   He continues to eat healthy and exercise multiple times per week.   He has no acute concerns today  Preventive Screening-Counseling & Management  Smoking Status: Never Smoker Second Hand Smoking status: No smokers in home  Risk Factors Regular exercise: Walks, weight training and bicycle.  Diet: Does not follow a certain diet. Tries to eat healthy.  Fall Risk: None   Cardiac risk factors:  advanced age (older than 7555 for men, 1965 for women)  Hyperlipidemia  Diabetes.  Family History: DM and HTN   Depression Screen None. PHQ2 0   Activities of Daily Living Independent ADLs and IADLs   Hearing Difficulties: Wears hearing aids  Cognitive Testing No reported trouble.   Normal 3 word recall  List the Names of Other Physician/Practitioners you currently use: 1. Urology - yearly  2. Optho - Dr. Nile RiggsShapiro  3. Cardiology - [facility]  Immunization History  Administered Date(s) Administered  . H1N1 07/15/2008  . Influenza Split 06/25/2011, 06/30/2012  . Influenza Whole 05/30/2007, 05/21/2008, 07/25/2009  . Influenza, High Dose Seasonal PF 06/06/2013  . Influenza-Unspecified 05/10/2015  . PPD Test 09/21/2013  . Pneumococcal Conjugate-13 12/04/2013  . Pneumococcal Polysaccharide-23 08/09/2005  . Td 08/09/2004, 12/10/2014  . Zoster 06/27/2008   Required Immunizations needed today: UTD    Screening tests- up to date Health Maintenance Due  Topic Date Due  . HEMOGLOBIN A1C  06/12/2015  . FOOT EXAM  09/03/2015    ROS- No pertinent positives discovered in course of AWV  The following were reviewed and entered/updated in epic: Past Medical History  Diagnosis Date  . CAD (coronary artery disease)     CABG 2005 by Dr Laneta SimmersBartle  . Hyperlipidemia   . Hypertension   . Hypothyroidism   . Sun-damaged skin   . Benign prostatic hypertrophy   . Gout   . DJD (degenerative joint disease)     bilateral knee, gout  . Hepatitis A 1950  . Hepatitis B 1980's  . Type II diabetes mellitus ([facility])   . Sleep apnea 1990's    "before uvulectomy"  . GERD (gastroesophageal reflux disease)   . Squamous cell carcinoma ([facility])     "lots on my arms & face" (09/21/2013)  . Personal history of other infectious and parasitic disease   . Knee joint replacement by other means   . Glomerulonephritis acute     at age 80, due to beta strep, resolved, renal calculi- passed spontaneously  . HOH (hard of hearing)     wears hearing aids  . Incontinence of urine    Patient Active Problem List   Diagnosis Date Noted  . Atypical nevus of shoulder 12/17/2014  . Primary localized osteoarthritis of right knee 08/22/2014  . Primary osteoarthritis of right knee 08/22/2014  . First degree AV block 02/04/2014  . GERD (gastroesophageal reflux disease) 12/06/2013  . Blindness of right eye 12/04/2013  . Constipation 09/27/2013  . Total knee replacement status  09/18/2013  . Knee pain, bilateral 11/30/2012  . Foreign body granuloma of soft tissue, NEC, right lower leg 11/30/2012  . Edema 10/25/2011  . Coronary atherosclerosis 06/26/2009  . RENAL CALCULUS, RECURRENT 01/03/2009  . BPH (benign prostatic hypertrophy) 05/30/2007  . Hypothyroidism 04/07/2007  . Type 2 diabetes mellitus with complication ([place]) [id number]  . Hyperlipidemia 04/07/2007  . Gout 04/07/2007  . Essential hypertension 04/07/2007  .  DERMATITIS, CNTCT, ACUTE D/T SOLAR RADIATION 04/07/2007  . HEPATITIS B, HX OF 04/07/2007   Past Surgical History  Procedure Laterality Date  . Coronary artery bypass graft  01/23/04    Dr Cyndia Bent  . Inguinal hernia repair Bilateral 1980's - 1990's  . Umbilical hernia repair    . Mastoidectomy Right 1943  . Quadriceps repair Right 2009  . Uvulopalatoplasty  [id number]    ENT MD  . Total knee arthroplasty Left 09/18/2013    Procedure: TOTAL KNEE ARTHROPLASTY;  Surgeon: Hessie Dibble, MD;  Location: [facility];  Service: Orthopedics;  Laterality: Left;  . Cataract extraction w/ intraocular lens  implant, bilateral Bilateral   . Retinal detachment surgery Right X 2  . Cardiac catheterization    . Tonsillectomy  ~ 1943  . Eye surgery Right     detached retina ,blind in right eye  . Total knee arthroplasty Right 08/22/2014    Procedure: RIGHT TOTAL KNEE ARTHROPLASTY;  Surgeon: Hessie Dibble, MD;  Location: [facility];  Service: Orthopedics;  Laterality: Right;    Family History  Problem Relation Age of Onset  . Diabetes Other     1st degree relative  . Hypertension      Family History    Medications- reviewed and updated Current Outpatient Prescriptions  Medication Sig Dispense Refill  . allopurinol (ZYLOPRIM) 300 MG tablet TAKE 1 TABLET EVERY DAY 90 tablet 3  . aspirin 325 MG tablet Take 325 mg by mouth daily.    . benazepril-hydrochlorthiazide (LOTENSIN HCT) 20-25 MG per tablet TAKE 1 TABLET EVERY DAY 90 tablet 3  . cetirizine (ZYRTEC) 10 MG tablet Take 10 mg by mouth daily as needed for allergies.    [name] Kitchen FREESTYLE LITE test strip USE TWICE A DAY AS DIRECTED 100 each 11  . furosemide (LASIX) 20 MG tablet Take 1 tablet (20 mg total) by mouth daily as needed for fluid or edema. 100 tablet 3  . glipiZIDE (GLUCOTROL) 5 MG tablet Take 1 tablet (5 mg total) by mouth 2 (two) times daily. 200 tablet 3  . insulin glargine (LANTUS) 100 UNIT/ML injection Inject 0.3 mLs (30 Units total) into the skin at  bedtime. 10 mL 10  . levothyroxine (SYNTHROID, LEVOTHROID) 100 MCG tablet Take 1 tablet (100 mcg total) by mouth every morning. 90 tablet 3  . lidocaine (XYLOCAINE) 4 % external solution Apply topically 3 (three) times daily as needed. 50 mL 2  . LORazepam (ATIVAN) 0.5 MG tablet Take 1 tablet (0.5 mg total) by mouth 2 (two) times daily as needed for anxiety. 60 tablet 2  . metFORMIN (GLUCOPHAGE) 1000 MG tablet TAKE 1 TABLET TWICE DAILY WITH A MEAL 200 tablet 3  . methocarbamol (ROBAXIN) 500 MG tablet Take 1 tablet (500 mg total) by mouth every 6 (six) hours as needed for muscle spasms. 50 tablet 0  . metoprolol succinate (TOPROL-XL) 25 MG 24 hr tablet Take 0.5 tablets (12.5 mg total) by mouth daily. 45 tablet 3  . mirabegron ER (MYRBETRIQ) 50 MG TB24 tablet Take 50 mg by mouth daily before breakfast.     .  Multiple Vitamin (MULTIVITAMIN PO) Take 1 tablet by mouth daily.      . niacin 100 MG tablet Take 100 mg by mouth daily.      . nitroGLYCERIN (NITROSTAT) 0.4 MG SL tablet Place 1 tablet (0.4 mg total) under the tongue as needed. 90 tablet 1  . Omega-3 Fatty Acids (FISH OIL) 1000 MG CAPS Take 1,000 mg by mouth 2 (two) times daily.    [name] Kitchen omeprazole (PRILOSEC) 20 MG capsule Take 20 mg by mouth daily as needed (heartburn or acid reflux).    [name] Kitchen oxybutynin (DITROPAN XL) 15 MG 24 hr tablet Take 1 tablet by mouth daily.    [name] Kitchen oxyCODONE (OXY IR/ROXICODONE) 5 MG immediate release tablet Take 1-2 tablets (5-10 mg total) by mouth every 4 (four) hours as needed for breakthrough pain. 50 tablet 0  . pioglitazone (ACTOS) 45 MG tablet TAKE 1 TABLET EVERY DAY 90 tablet 0  . polyethylene glycol (MIRALAX / GLYCOLAX) packet Take 17 g by mouth daily as needed for moderate constipation.    . senna (SENOKOT) 8.6 MG tablet Take 1-2 tablets by mouth daily as needed for constipation.    . simvastatin (ZOCOR) 40 MG tablet Take 1 tablet (40 mg total) by mouth at bedtime. 100 tablet 3  . tamsulosin (FLOMAX) 0.4 MG CAPS capsule  Take 1 capsule (0.4 mg total) by mouth daily. 90 capsule 3   No current facility-administered medications for this visit.    Allergies-reviewed and updated Allergies  Allergen Reactions  . Bee Venom Anaphylaxis    Social History   Social History  . Marital Status: Married    Spouse Name: N/A  . Number of Children: N/A  . Years of Education: N/A   Occupational History  . retired    Social History Main Topics  . Smoking status: Former Smoker -- 1.00 packs/day for 10 years    Types: Cigarettes    Quit date: 09/09/1957  . Smokeless tobacco: None  . Alcohol Use: 8.4 oz/week    14 Shots of liquor per week     Comment: 1 2 oz shots of vodka per night  . Drug Use: No  . Sexual Activity: Not Currently   Other Topics Concern  . None   Social History Narrative   Pt lives in [place].  Retired Magazine features editor (retired 2005)    Objective: BP 106/70 mmHg  Temp(Src) 98.4 F (36.9 C) (Oral)  Ht 6' 2.5" (1.892 m)  Wt 247 lb 9.6 oz (112.311 kg)  BMI 31.37 kg/m2 Constitutional: He is oriented to person, place, and time. He appears well-developed and well-nourished.  HENT:  Head: Normocephalic and atraumatic.  Right Ear: External ear normal.  Left Ear: External ear normal.  Nose: Nose normal.  Mouth/Throat: Oropharynx is clear and moist.  Eyes: Conjunctivae and EOM are normal. Pupils are equal, round, and reactive to light.  Neck: Normal range of motion. Neck supple. No JVD present. No tracheal deviation present. No thyromegaly present.  Cardiovascular: Normal rate, regular rhythm, normal heart sounds and intact distal pulses. Exam reveals no gallop and no friction rub.  No murmur heard. No carotid nor aortic bruits peripheral pulses 1+ and symmetrical  Pulmonary/Chest: Effort normal and breath sounds normal. No stridor. No respiratory distress. He has no wheezes. He has no rales. He exhibits no tenderness.  Abdominal: Soft. Bowel sounds are normal. He exhibits no  distension and no mass. There is no tenderness. There is no rebound and no guarding.  Genitourinary:  Genitorectal exam will  be done by urologist therefore not repeated  Musculoskeletal: Normal range of motion. He exhibits no edema or tenderness.  Lymphadenopathy:   He has no cervical adenopathy.  Neurological: He is alert and oriented to person, place, and time. He has normal reflexes. No cranial nerve deficit. He exhibits normal muscle tone.  Skin: Skin is warm and dry. No rash noted. No erythema. No pallor.  Total body skin exam normal. Scattered keratosis. Psychiatric: He has a normal mood and affect. His behavior is normal. Judgment and thought content normal.  Nursing note and vitals reviewed.  Assessment/Plan: 1. Medicare annual wellness visit, subsequent - Follow up in one year for next MWE - He can follow up sooner if needed - Would like him to lose weight.  - Reviewed medicare wellness exam questionnaire in detail with patient.  2. Essential hypertension - Controlled on current medication - POCT Urinalysis Dipstick (Automated) - Basic metabolic panel - CBC with Differential/Platelet - EKG 12-Lead- SR, First degree AV block, rate 74. Consistent with previous EKGs - Hemoglobin A1c - Hepatic function panel - Lipid panel - TSH - PSA  3. Type 2 diabetes mellitus with complication, without long-term current use of insulin ([facility])  - POCT Urinalysis Dipstick (Automated) - Basic metabolic panel - CBC with Differential/Platelet - EKG 12-Lead - Hemoglobin A1c - Hepatic function panel - Lipid panel - TSH - PSA - Consider changing medications.  - Needs to lose weight.  4. Other specified hypothyroidism - POCT Urinalysis Dipstick (Automated) - Basic metabolic panel - CBC with Differential/Platelet - EKG 12-Lead - Hemoglobin A1c - Hepatic function panel - Lipid panel - TSH - PSA  5. BPH (benign prostatic hypertrophy) - PSA - Is taking Mybetriq, Flomax, and  Oxybutynin. Advised to just take Myrbetriq  6. Hyperlipidemia - POCT Urinalysis Dipstick (Automated) - Basic metabolic panel - CBC with Differential/Platelet - EKG 12-Lead - Hemoglobin A1c - Hepatic function panel - Lipid panel - TSH - PSA  7. First degree AV block - Follow up with cardiology as needed - EKG 12-Lead   Return precautions advised.   Dorothyann Peng, NP

## 2015-12-19 NOTE — Patient Instructions (Addendum)
It was great meeting you today!  I will follow up with you regarding your blood work.   Work on diet, exercise and weight loss.   Follow up as you normally would with Dr. Sherren [name].   If you need anything, please let me know.   Health Maintenance, Male A healthy lifestyle and preventative care can promote health and wellness.  Maintain regular health, dental, and eye exams.  Eat a healthy diet. Foods like vegetables, fruits, whole grains, low-fat dairy products, and lean protein foods contain the nutrients you need and are low in calories. Decrease your intake of foods high in solid fats, added sugars, and salt. Get information about a proper diet from your health care provider, if necessary.  Regular physical exercise is one of the most important things you can do for your health. Most adults should get at least 150 minutes of moderate-intensity exercise (any activity that increases your heart rate and causes you to sweat) each week. In addition, most adults need muscle-strengthening exercises on 2 or more days a week.   Maintain a healthy weight. The body mass index (BMI) is a screening tool to identify possible weight problems. It provides an estimate of body fat based on height and weight. Your health care provider can find your BMI and can help you achieve or maintain a healthy weight. For males 20 years and older:  A BMI below 18.5 is considered underweight.  A BMI of 18.5 to 24.9 is normal.  A BMI of 25 to 29.9 is considered overweight.  A BMI of 30 and above is considered obese.  Maintain normal blood lipids and cholesterol by exercising and minimizing your intake of saturated fat. Eat a balanced diet with plenty of fruits and vegetables. Blood tests for lipids and cholesterol should begin at age 4 and be repeated every 5 years. If your lipid or cholesterol levels are high, you are over age 2, or you are at high risk for heart disease, you may need your cholesterol levels checked  more frequently.Ongoing high lipid and cholesterol levels should be treated with medicines if diet and exercise are not working.  If you smoke, find out from your health care provider how to quit. If you do not use tobacco, do not start.  Lung cancer screening is recommended for adults aged 83-80 years who are at high risk for developing lung cancer because of a history of smoking. A yearly low-dose CT scan of the lungs is recommended for people who have at least a 30-pack-year history of smoking and are current smokers or have quit within the past 15 years. A pack year of smoking is smoking an average of 1 pack of cigarettes a day for 1 year (for example, a 30-pack-year history of smoking could mean smoking 1 pack a day for 30 years or 2 packs a day for 15 years). Yearly screening should continue until the smoker has stopped smoking for at least 15 years. Yearly screening should be stopped for people who develop a health problem that would prevent them from having lung cancer treatment.  If you choose to drink alcohol, do not have more than 2 drinks per day. One drink is considered to be 12 oz (360 mL) of beer, 5 oz (150 mL) of wine, or 1.5 oz (45 mL) of liquor.  Avoid the use of street drugs. Do not share needles with anyone. Ask for help if you need support or instructions about stopping the use of drugs.  High blood  pressure causes heart disease and increases the risk of stroke. High blood pressure is more likely to develop in:  People who have blood pressure in the end of the normal range (100-139/85-89 mm Hg).  People who are overweight or obese.  People who are African American.  If you are 78-47 years of age, have your blood pressure checked every 3-5 years. If you are 99 years of age or older, have your blood pressure checked every year. You should have your blood pressure measured twice--once when you are at a hospital or clinic, and once when you are not at a hospital or clinic. Record  the average of the two measurements. To check your blood pressure when you are not at a hospital or clinic, you can use:  An automated blood pressure machine at a pharmacy.  A home blood pressure monitor.  If you are 52-85 years old, ask your health care provider if you should take aspirin to prevent heart disease.  Diabetes screening involves taking a blood sample to check your fasting blood sugar level. This should be done once every 3 years after age 52 if you are at a normal weight and without risk factors for diabetes. Testing should be considered at a younger age or be carried out more frequently if you are overweight and have at least 1 risk factor for diabetes.  Colorectal cancer can be detected and often prevented. Most routine colorectal cancer screening begins at the age of 57 and continues through age 95. However, your health care provider may recommend screening at an earlier age if you have risk factors for colon cancer. On a yearly basis, your health care provider may provide home test kits to check for hidden blood in the stool. A small camera at the end of a tube may be used to directly examine the colon (sigmoidoscopy or colonoscopy) to detect the earliest forms of colorectal cancer. Talk to your health care provider about this at age 78 when routine screening begins. A direct exam of the colon should be repeated every 5-10 years through age 68, unless early forms of precancerous polyps or small growths are found.  People who are at an increased risk for hepatitis B should be screened for this virus. You are considered at high risk for hepatitis B if:  You were born in a country where hepatitis B occurs often. Talk with your health care provider about which countries are considered high risk.  Your parents were born in a high-risk country and you have not received a shot to protect against hepatitis B (hepatitis B vaccine).  You have HIV or AIDS.  You use needles to inject  street drugs.  You live with, or have sex with, someone who has hepatitis B.  You are a man who has sex with other men (MSM).  You get hemodialysis treatment.  You take certain medicines for conditions like cancer, organ transplantation, and autoimmune conditions.  Hepatitis C blood testing is recommended for all people born from [birth year] through 1965 and any individual with known risk factors for hepatitis C.  Healthy men should no longer receive prostate-specific antigen (PSA) blood tests as part of routine cancer screening. Talk to your health care provider about prostate cancer screening.  Testicular cancer screening is not recommended for adolescents or adult males who have no symptoms. Screening includes self-exam, a health care provider exam, and other screening tests. Consult with your health care provider about any symptoms you have or any concerns you  have about testicular cancer.  Practice safe sex. Use condoms and avoid high-risk sexual practices to reduce the spread of sexually transmitted infections (STIs).  You should be screened for STIs, including gonorrhea and chlamydia if:  You are sexually active and are younger than 24 years.  You are older than 24 years, and your health care provider tells you that you are at risk for this type of infection.  Your sexual activity has changed since you were last screened, and you are at an increased risk for chlamydia or gonorrhea. Ask your health care provider if you are at risk.  If you are at risk of being infected with HIV, it is recommended that you take a prescription medicine daily to prevent HIV infection. This is called pre-exposure prophylaxis (PrEP). You are considered at risk if:  You are a man who has sex with other men (MSM).  You are a heterosexual man who is sexually active with multiple partners.  You take drugs by injection.  You are sexually active with a partner who has HIV.  Talk with your health care provider  about whether you are at high risk of being infected with HIV. If you choose to begin PrEP, you should first be tested for HIV. You should then be tested every 3 months for as long as you are taking PrEP.  Use sunscreen. Apply sunscreen liberally and repeatedly throughout the day. You should seek shade when your shadow is shorter than you. Protect yourself by wearing long sleeves, pants, a wide-brimmed hat, and sunglasses year round whenever you are outdoors.  Tell your health care provider of new moles or changes in moles, especially if there is a change in shape or color. Also, tell your health care provider if a mole is larger than the size of a pencil eraser.  A one-time screening for abdominal aortic aneurysm (AAA) and surgical repair of large AAAs by ultrasound is recommended for men aged 3-75 years who are current or former smokers.  Stay current with your vaccines (immunizations).   This information is not intended to replace advice given to you by your health care provider. Make sure you discuss any questions you have with your health care provider.   Document Released: 01/22/2008 Document Revised: 08/16/2014 Document Reviewed: 12/21/2010 Elsevier Interactive Patient Education [employer].

## 2015-12-19 NOTE — Telephone Encounter (Signed)
Patient notified of lab results. Patient verbalized understanding.    

## 2015-12-19 NOTE — Telephone Encounter (Signed)
Pt is return Jonathan [name]

## 2015-12-19 NOTE — Progress Notes (Deleted)
Subjective:    Patient ID: Jonathan [name], male    DOB: 1935-11-21, 80 y.o.   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 Colonoscopy in 2007.     Review of Systems  Constitutional: Negative.   HENT: Negative.   Eyes: Negative.   Respiratory: Negative.   Cardiovascular: Negative.   Gastrointestinal: Negative.   Endocrine: Negative.   Genitourinary: Negative.   Musculoskeletal: Negative.   Skin: Negative.   Allergic/Immunologic: Negative.   Neurological: Negative.   Hematological: Negative.   Psychiatric/Behavioral: Negative.   All other systems reviewed and are negative.  Past Medical History  Diagnosis Date  . CAD (coronary artery disease)     CABG 2005 by Dr Cyndia Bent  . Hyperlipidemia   . Hypertension   . Hypothyroidism   . Sun-damaged skin   . Benign prostatic hypertrophy   . Gout   . DJD (degenerative joint disease)     bilateral knee, gout  . Hepatitis A 1950  . Hepatitis B 1980's  . Type II diabetes mellitus ([place])   . Sleep apnea 1990's    "before uvulectomy"  . GERD (gastroesophageal reflux disease)   . Squamous cell carcinoma ([place])     "lots on my arms & face" (09/21/2013)  . Personal history of other infectious and parasitic disease   . Knee joint replacement by other means   . Glomerulonephritis acute     at age 56, due to beta strep, resolved, renal calculi- passed spontaneously  . HOH (hard of hearing)     wears hearing aids  . Incontinence of urine     Social History   Social History  . Marital Status: Married    Spouse Name: N/A  . Number of Children: N/A  . Years of  Education: N/A   Occupational History  . retired    Social History Main Topics  . Smoking status: Former Smoker -- 1.00 packs/day for 10 years    Types: Cigarettes    Quit date: 09/09/1957  . Smokeless tobacco: Not on file  . Alcohol Use: 8.4 oz/week    14 Shots of liquor per week     Comment: 1 2 oz shots of vodka per night  . Drug Use: No  . Sexual Activity: Not Currently   Other Topics Concern  . Not on file   Social History Narrative   Pt lives in [place].  Retired Magazine features editor (retired 2005)    Past Surgical History  Procedure Laterality Date  . Coronary artery bypass graft  01/23/04    Dr Cyndia Bent  . Inguinal hernia repair Bilateral 1980's - 1990's  . Umbilical hernia repair    . Mastoidectomy Right 1943  . Quadriceps repair Right 2009  . Uvulopalatoplasty  [id number]    ENT MD  . Total knee arthroplasty Left 09/18/2013    Procedure: TOTAL KNEE ARTHROPLASTY;  Surgeon: Hessie Dibble, MD;  Location: [facility];  Service: Orthopedics;  Laterality: Left;  . Cataract extraction w/ intraocular lens  implant, bilateral Bilateral   . Retinal detachment surgery Right X 2  . Cardiac catheterization    . Tonsillectomy  ~ 1943  . Eye surgery Right  detached retina ,blind in right eye  . Total knee arthroplasty Right 08/22/2014    Procedure: RIGHT TOTAL KNEE ARTHROPLASTY;  Surgeon: Hessie Dibble, MD;  Location: [facility];  Service: Orthopedics;  Laterality: Right;    Family History  Problem Relation Age of Onset  . Diabetes Other     1st degree relative  . Hypertension      Family History    Allergies  Allergen Reactions  . Bee Venom Anaphylaxis    Current Outpatient Prescriptions on File Prior to Visit  Medication Sig Dispense Refill  . allopurinol (ZYLOPRIM) 300 MG tablet TAKE 1 TABLET EVERY DAY 90 tablet 3  . aspirin 325 MG tablet Take 325 mg by mouth daily.    . benazepril-hydrochlorthiazide (LOTENSIN HCT) 20-25 MG per tablet TAKE 1 TABLET EVERY DAY 90 tablet  3  . cetirizine (ZYRTEC) 10 MG tablet Take 10 mg by mouth daily as needed for allergies.    [name] Kitchen FREESTYLE LITE test strip USE TWICE A DAY AS DIRECTED 100 each 11  . furosemide (LASIX) 20 MG tablet Take 1 tablet (20 mg total) by mouth daily as needed for fluid or edema. 100 tablet 3  . glipiZIDE (GLUCOTROL) 5 MG tablet Take 1 tablet (5 mg total) by mouth 2 (two) times daily. 200 tablet 3  . insulin glargine (LANTUS) 100 UNIT/ML injection Inject 0.3 mLs (30 Units total) into the skin at bedtime. 10 mL 10  . levothyroxine (SYNTHROID, LEVOTHROID) 100 MCG tablet Take 1 tablet (100 mcg total) by mouth every morning. 90 tablet 3  . lidocaine (XYLOCAINE) 4 % external solution Apply topically 3 (three) times daily as needed. 50 mL 2  . LORazepam (ATIVAN) 0.5 MG tablet Take 1 tablet (0.5 mg total) by mouth 2 (two) times daily as needed for anxiety. 60 tablet 2  . metFORMIN (GLUCOPHAGE) 1000 MG tablet TAKE 1 TABLET TWICE DAILY WITH A MEAL 200 tablet 3  . methocarbamol (ROBAXIN) 500 MG tablet Take 1 tablet (500 mg total) by mouth every 6 (six) hours as needed for muscle spasms. 50 tablet 0  . metoprolol succinate (TOPROL-XL) 25 MG 24 hr tablet Take 0.5 tablets (12.5 mg total) by mouth daily. 45 tablet 3  . mirabegron ER (MYRBETRIQ) 50 MG TB24 tablet Take 50 mg by mouth daily before breakfast.     . Multiple Vitamin (MULTIVITAMIN PO) Take 1 tablet by mouth daily.      . niacin 100 MG tablet Take 100 mg by mouth daily.      . nitroGLYCERIN (NITROSTAT) 0.4 MG SL tablet Place 1 tablet (0.4 mg total) under the tongue as needed. 90 tablet 1  . Omega-3 Fatty Acids (FISH OIL) 1000 MG CAPS Take 1,000 mg by mouth 2 (two) times daily.    [name] Kitchen omeprazole (PRILOSEC) 20 MG capsule Take 20 mg by mouth daily as needed (heartburn or acid reflux).    [name] Kitchen oxybutynin (DITROPAN XL) 15 MG 24 hr tablet Take 1 tablet by mouth daily.    [name] Kitchen oxyCODONE (OXY IR/ROXICODONE) 5 MG immediate release tablet Take 1-2 tablets (5-10 mg total) by mouth  every 4 (four) hours as needed for breakthrough pain. 50 tablet 0  . pioglitazone (ACTOS) 45 MG tablet TAKE 1 TABLET EVERY DAY 90 tablet 0  . polyethylene glycol (MIRALAX / GLYCOLAX) packet Take 17 g by mouth daily as needed for moderate constipation.    . senna (SENOKOT) 8.6 MG tablet Take 1-2 tablets by mouth daily as needed for constipation.    [name] Kitchen  simvastatin (ZOCOR) 40 MG tablet Take 1 tablet (40 mg total) by mouth at bedtime. 100 tablet 3  . tamsulosin (FLOMAX) 0.4 MG CAPS capsule Take 1 capsule (0.4 mg total) by mouth daily. 90 capsule 3   No current facility-administered medications on file prior to visit.    BP 106/70 mmHg  Temp(Src) 98.4 F (36.9 C) (Oral)  Ht 6' 2.5" (1.892 m)  Wt 247 lb 9.6 oz (112.311 kg)  BMI 31.37 kg/m2       Objective:   Physical Exam  Constitutional: He is oriented to person, place, and time. He appears well-developed and well-nourished. No distress.  HENT:  Head: Normocephalic and atraumatic.  Right Ear: External ear normal.  Left Ear: External ear normal.  Nose: Nose normal.  Mouth/Throat: Oropharynx is clear and moist. No oropharyngeal exudate.  Eyes: Conjunctivae and EOM are normal. Pupils are equal, round, and reactive to light. Right eye exhibits no discharge. Left eye exhibits no discharge. No scleral icterus.  Neck: Normal range of motion. Neck supple. No JVD present. No tracheal deviation present. No thyromegaly present.  Cardiovascular: Normal rate, regular rhythm, normal heart sounds and intact distal pulses.  Exam reveals no gallop and no friction rub.   No murmur heard. Pulmonary/Chest: Effort normal and breath sounds normal. No stridor. No respiratory distress. He has no wheezes. He has no rales. He exhibits no tenderness.  Abdominal: Soft. Bowel sounds are normal. He exhibits no distension and no mass. There is no tenderness. There is no rebound and no guarding.  Musculoskeletal: Normal range of motion. He exhibits no edema or  tenderness.  Lymphadenopathy:    He has no cervical adenopathy.  Neurological: He is alert and oriented to person, place, and time. He has normal reflexes. He displays normal reflexes. No cranial nerve deficit. He exhibits normal muscle tone. Coordination normal.  Skin: Skin is warm and dry. No rash noted. He is not diaphoretic. No erythema. No pallor.  Psychiatric: He has a normal mood and affect. His behavior is normal. Judgment and thought content normal.  Nursing note and vitals reviewed.      Assessment & Plan:

## 2015-12-30 IMAGING — CR DG CHEST 2V
2 series · 2 of 2 positions shown · non-contrast
Comparison: DG CHEST 2 VIEW dated 03/20/2004

CLINICAL DATA: Hypertension.  CABG.

EXAM:
CHEST  2 VIEW

[w chest pa]
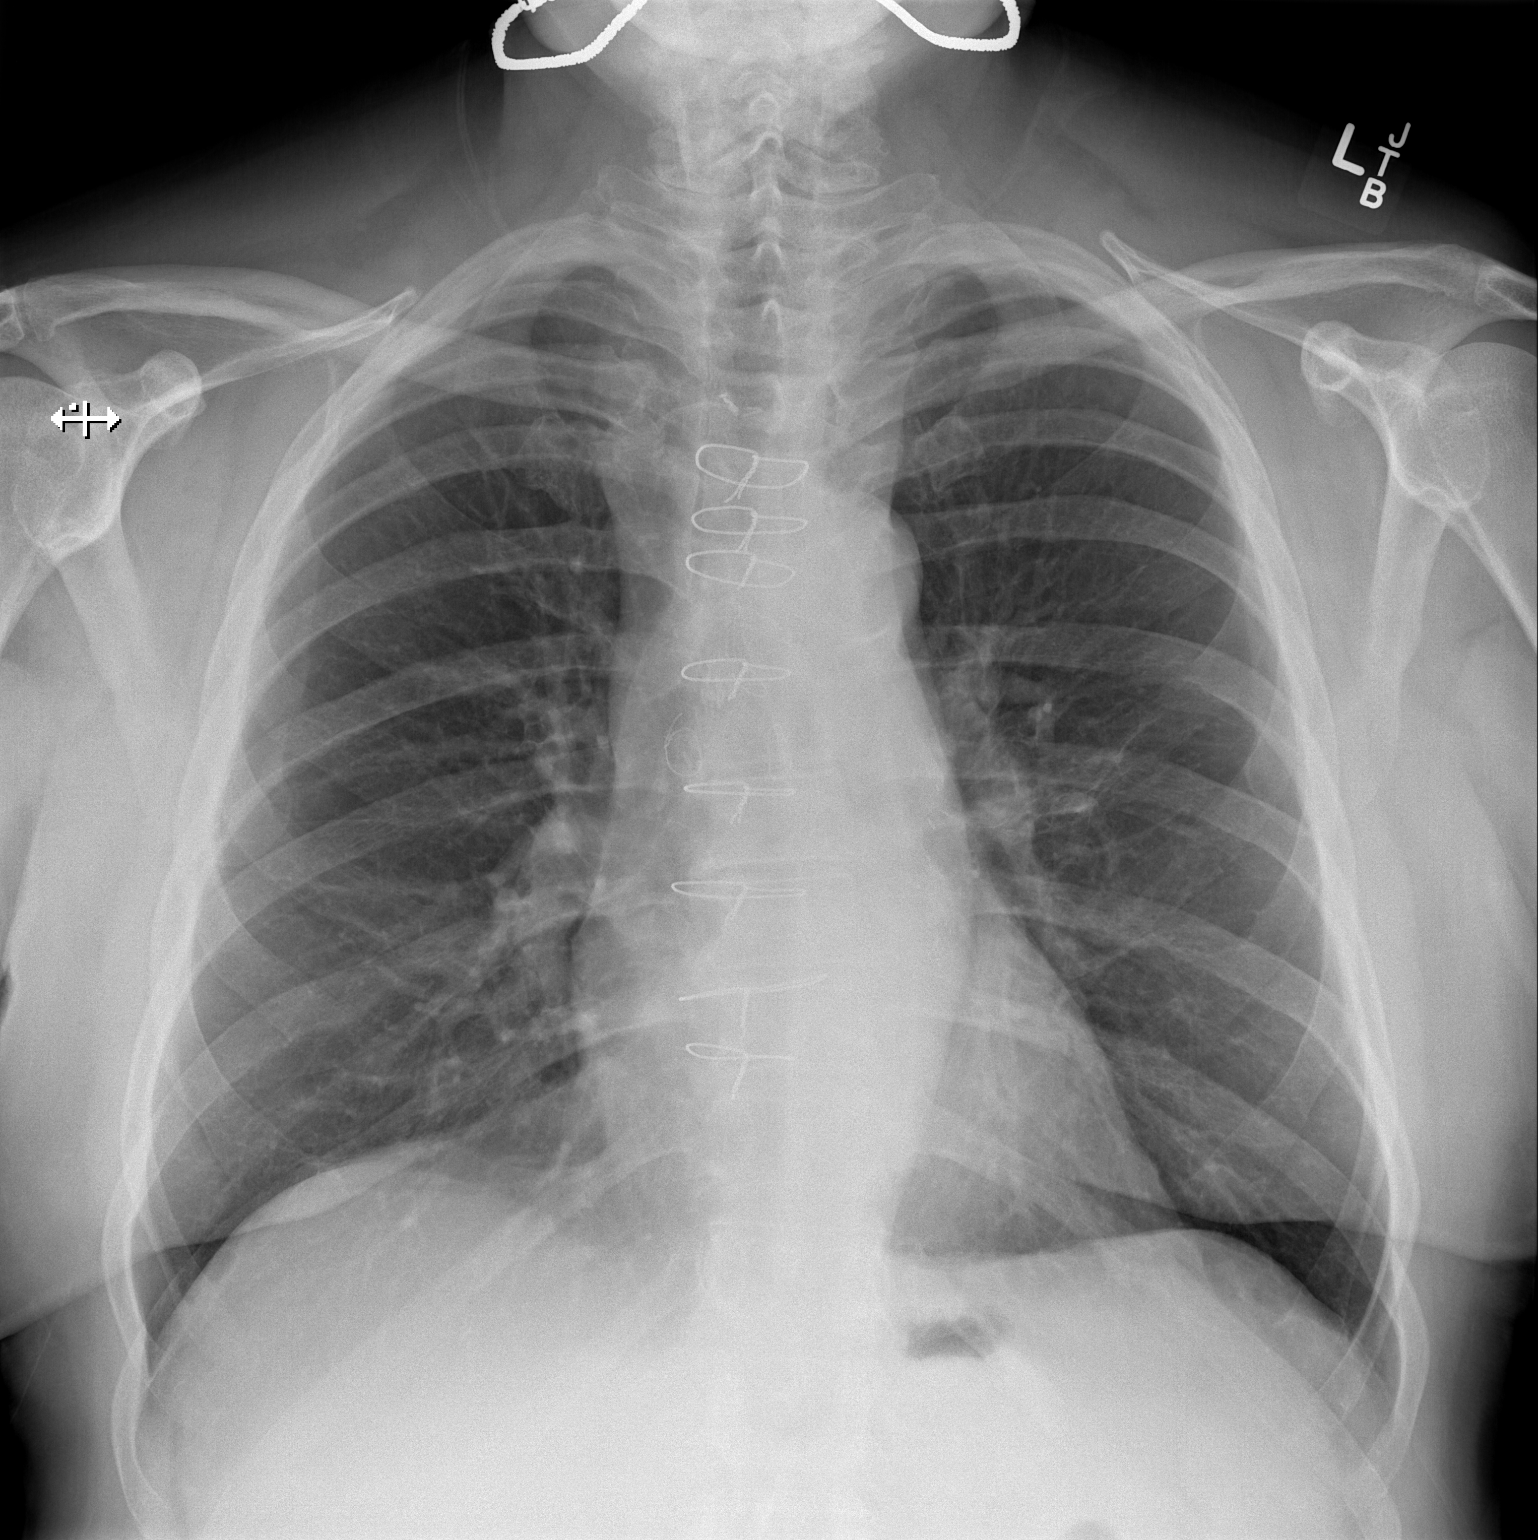

[w chest lat]
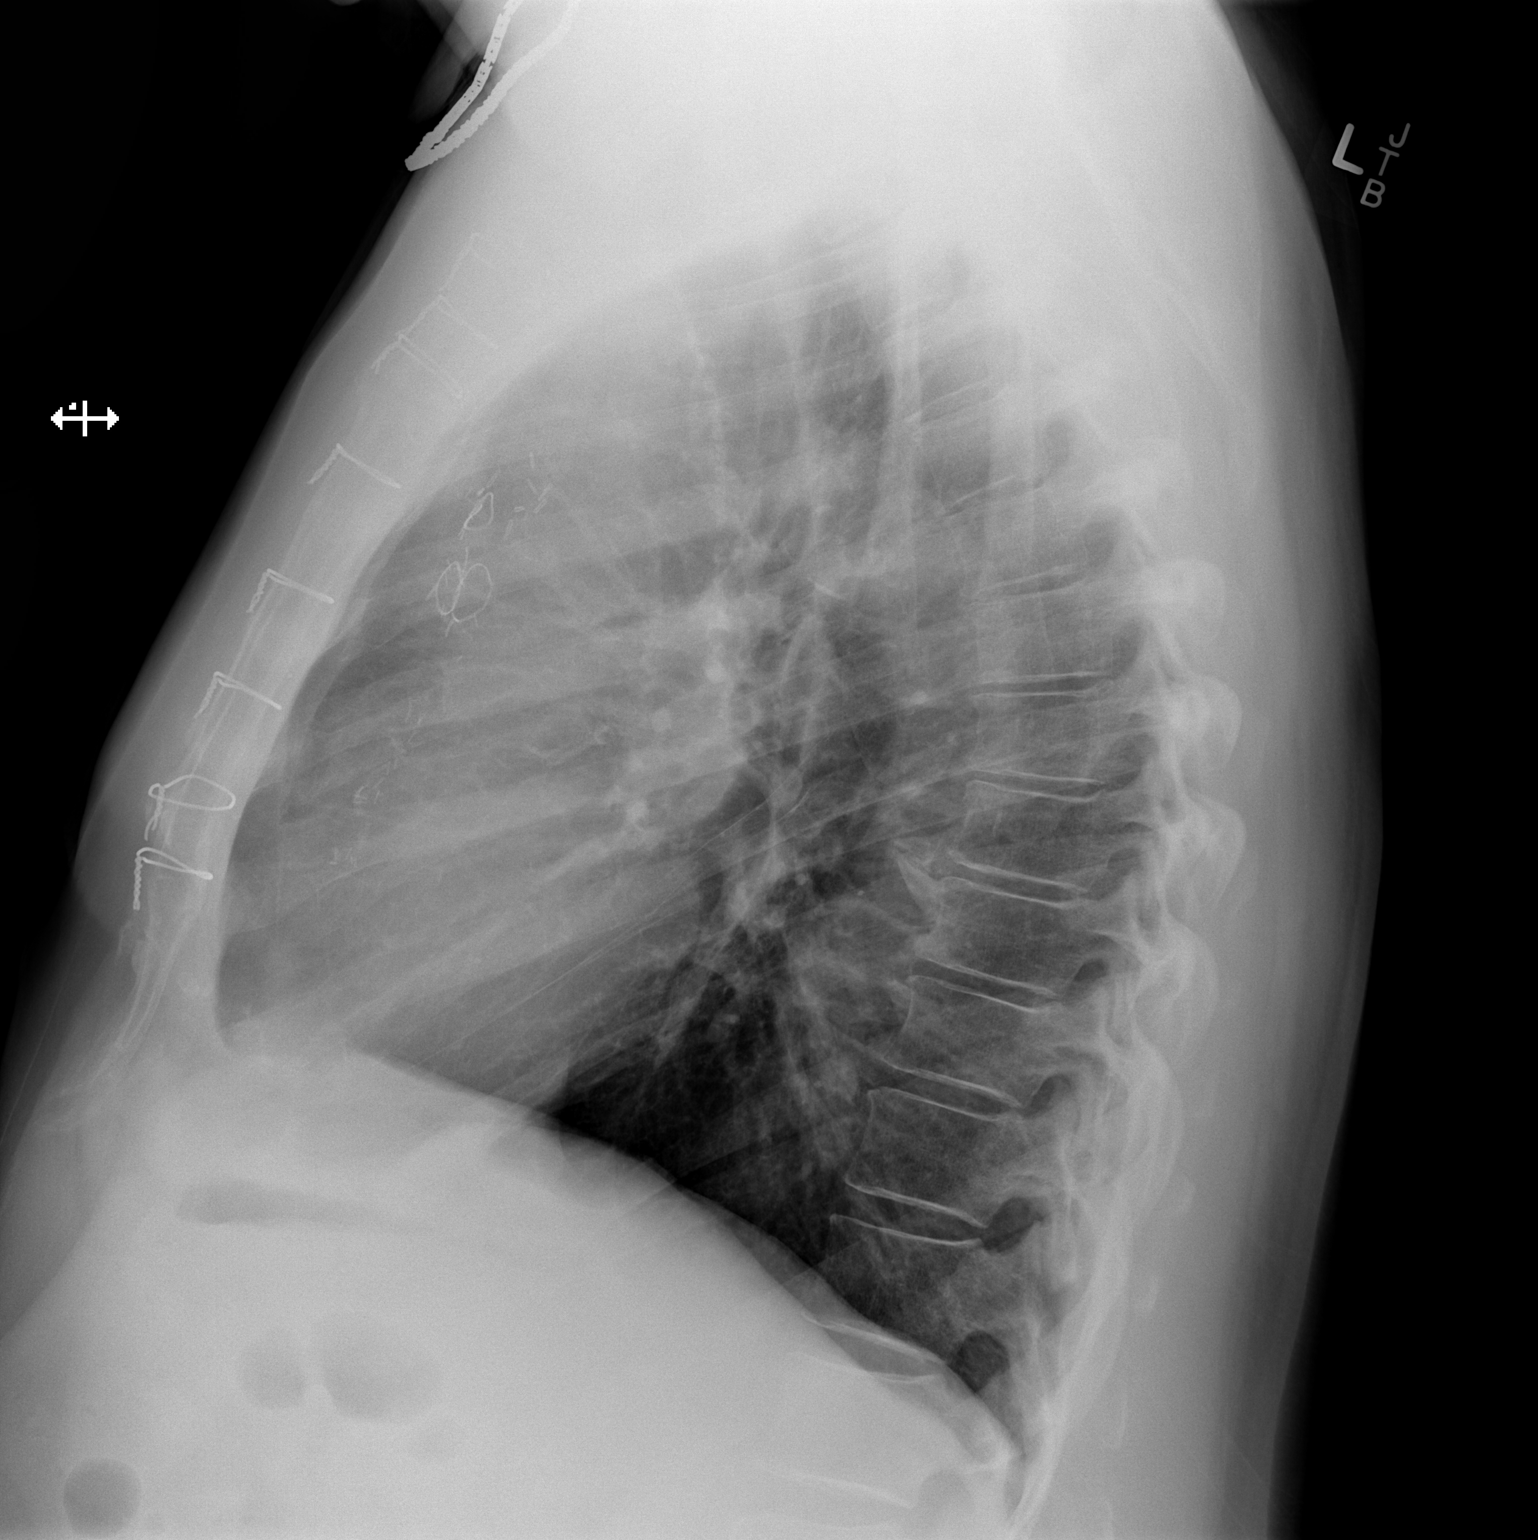

[2 of 2 positions shown; findings below may reference images not displayed]

FINDINGS: Mediastinum and hilar structures are normal. Heart size normal.
Prior CABG. Lungs are clear. No pleural effusion or pneumothorax. No
acute osseous abnormality. Chest is stable from prior exam.
IMPRESSION: No active cardiopulmonary disease.

## 2016-01-08 ENCOUNTER — Other Ambulatory Visit: Payer: Self-pay | Admitting: Family Medicine

## 2016-01-12 ENCOUNTER — Encounter: Payer: Medicare Other | Admitting: Family Medicine

## 2016-01-20 ENCOUNTER — Other Ambulatory Visit: Payer: Self-pay | Admitting: Family Medicine

## 2016-01-20 NOTE — Telephone Encounter (Signed)
Rx refill sent to pharmacy. 

## 2016-02-25 ENCOUNTER — Encounter: Payer: Self-pay | Admitting: Internal Medicine

## 2016-03-01 ENCOUNTER — Encounter: Payer: Self-pay | Admitting: Gastroenterology

## 2016-03-26 DIAGNOSIS — N401 Enlarged prostate with lower urinary tract symptoms: Secondary | ICD-10-CM | POA: Diagnosis not present

## 2016-03-26 DIAGNOSIS — R3915 Urgency of urination: Secondary | ICD-10-CM | POA: Diagnosis not present

## 2016-04-30 ENCOUNTER — Encounter: Payer: Self-pay | Admitting: Internal Medicine

## 2016-05-01 ENCOUNTER — Other Ambulatory Visit: Payer: Self-pay | Admitting: Endocrinology

## 2016-05-01 NOTE — Telephone Encounter (Signed)
Please refill x 1 Ov is due  

## 2016-05-05 ENCOUNTER — Other Ambulatory Visit: Payer: Self-pay

## 2016-05-05 MED ORDER — GLUCOSE BLOOD VI STRP: ORAL_STRIP | 0 refills | Status: DC

## 2016-05-19 ENCOUNTER — Encounter (INDEPENDENT_AMBULATORY_CARE_PROVIDER_SITE_OTHER): Payer: Self-pay

## 2016-05-19 ENCOUNTER — Encounter: Payer: Self-pay | Admitting: Internal Medicine

## 2016-05-19 ENCOUNTER — Ambulatory Visit (INDEPENDENT_AMBULATORY_CARE_PROVIDER_SITE_OTHER): Payer: Medicare Other | Admitting: Internal Medicine

## 2016-05-19 VITALS — BP 108/80 | HR 73 | Ht 74.5 in | Wt 243.8 lb

## 2016-05-19 DIAGNOSIS — I25810 Atherosclerosis of coronary artery bypass graft(s) without angina pectoris: Secondary | ICD-10-CM

## 2016-05-19 DIAGNOSIS — I440 Atrioventricular block, first degree: Secondary | ICD-10-CM

## 2016-05-19 DIAGNOSIS — I10 Essential (primary) hypertension: Secondary | ICD-10-CM | POA: Diagnosis not present

## 2016-05-19 NOTE — Patient Instructions (Signed)

## 2016-05-20 NOTE — Progress Notes (Signed)
PCP: Joycelyn Man, MD  Jonathan [name] is a 80 y.o. male who presents today for routine cardiology followup.  Since last being seen in our clinic, the patient reports doing very well.  He remains active. Today, he denies symptoms of palpitations, chest pain, shortness of breath,  lower extremity edema, dizziness, presyncope, or syncope.  The patient is otherwise without complaint today.   Past Medical History:  Diagnosis Date  . Benign prostatic hypertrophy   . CAD (coronary artery disease)    CABG 2005 by Dr Cyndia Bent  . DJD (degenerative joint disease)    bilateral knee, gout  . GERD (gastroesophageal reflux disease)   . Glomerulonephritis acute    at age 71, due to beta strep, resolved, renal calculi- passed spontaneously  . Gout   . Hepatitis A 1950  . Hepatitis B 1980's  . HOH (hard of hearing)    wears hearing aids  . Hyperlipidemia   . Hypertension   . Hypothyroidism   . Incontinence of urine   . Knee joint replacement by other means   . Personal history of other infectious and parasitic disease   . Sleep apnea 1990's   "before uvulectomy"  . Squamous cell carcinoma    "lots on my arms & face" (09/21/2013)  . Sun-damaged skin   . Type II diabetes mellitus ([place])    Past Surgical History:  Procedure Laterality Date  . CARDIAC CATHETERIZATION    . CATARACT EXTRACTION W/ INTRAOCULAR LENS  IMPLANT, BILATERAL Bilateral   . CORONARY ARTERY BYPASS GRAFT  01/23/04   Dr Cyndia Bent  . EYE SURGERY Right    detached retina ,blind in right eye  . INGUINAL HERNIA REPAIR Bilateral 1980's - 1990's  . MASTOIDECTOMY Right 1943  . QUADRICEPS REPAIR Right 2009  . RETINAL DETACHMENT SURGERY Right X 2  . TONSILLECTOMY  ~ 1943  . TOTAL KNEE ARTHROPLASTY Left 09/18/2013   Procedure: TOTAL KNEE ARTHROPLASTY;  Surgeon: Hessie Dibble, MD;  Location: [facility];  Service: Orthopedics;  Laterality: Left;  . TOTAL KNEE ARTHROPLASTY Right 08/22/2014   Procedure: RIGHT TOTAL KNEE ARTHROPLASTY;   Surgeon: Hessie Dibble, MD;  Location: [facility];  Service: Orthopedics;  Laterality: Right;  . UMBILICAL HERNIA REPAIR    . UVULOPALATOPLASTY  1990's   ENT MD    Current Outpatient Prescriptions  Medication Sig Dispense Refill  . allopurinol (ZYLOPRIM) 300 MG tablet Take 300 mg by mouth daily.    [name] Kitchen aspirin EC 81 MG tablet Take 81 mg by mouth daily.    . benazepril-hydrochlorthiazide (LOTENSIN HCT) 20-25 MG tablet Take 1 tablet by mouth daily.    . cetirizine (ZYRTEC) 10 MG tablet Take 10 mg by mouth daily as needed for allergies.    . furosemide (LASIX) 20 MG tablet Take 1 tablet (20 mg total) by mouth daily as needed for fluid or edema. 100 tablet 3  . glipiZIDE (GLUCOTROL) 5 MG tablet Take 5 mg by mouth 2 (two) times daily.    [name] Kitchen glucose blood (FREESTYLE LITE) test strip USE TWICE A DAY AS DIRECTED Dx Code. E11.9 100 each 0  . insulin glargine (LANTUS) 100 UNIT/ML injection Inject 30 Units into the skin at bedtime as needed (Use as directed).    [name] Kitchen levothyroxine (SYNTHROID, LEVOTHROID) 100 MCG tablet Take 100 mcg by mouth every morning.    . lidocaine (XYLOCAINE) 4 % external solution Apply topically 3 (three) times daily as needed. Use as directed    . LORazepam (ATIVAN)  0.5 MG tablet Take 1 tablet (0.5 mg total) by mouth 2 (two) times daily as needed for anxiety. 60 tablet 2  . metFORMIN (GLUCOPHAGE) 1000 MG tablet Take 1,000 mg by mouth 2 (two) times daily with a meal.    . metoprolol succinate (TOPROL-XL) 25 MG 24 hr tablet Take 0.5 tablets (12.5 mg total) by mouth daily. 45 tablet 3  . mirabegron ER (MYRBETRIQ) 50 MG TB24 tablet Take 50 mg by mouth daily before breakfast.     . Multiple Vitamin (MULTIVITAMIN PO) Take 1 tablet by mouth daily.      . nitroGLYCERIN (NITROSTAT) 0.4 MG SL tablet Place 0.4 mg under the tongue every 5 (five) minutes x 3 doses as needed for chest pain.    . Omega-3 Fatty Acids (FISH OIL) 1000 MG CAPS Take 1,000 mg by mouth daily.     [name] Kitchen omeprazole (PRILOSEC) 20  MG capsule Take 20 mg by mouth daily as needed (heartburn or acid reflux).    [name] Kitchen oxybutynin (DITROPAN XL) 15 MG 24 hr tablet Take 1 tablet by mouth daily.    . pioglitazone (ACTOS) 45 MG tablet Take 45 mg by mouth daily.    . polyethylene glycol (MIRALAX / GLYCOLAX) packet Take 17 g by mouth daily as needed for moderate constipation.    . Probiotic Product (ALIGN) 4 MG CAPS Take 1 capsule (4 mg total) by mouth daily. 90 capsule 3  . senna (SENOKOT) 8.6 MG tablet Take 1-2 tablets by mouth daily as needed for constipation.    . simvastatin (ZOCOR) 40 MG tablet Take 40 mg by mouth at bedtime.    . niacin 100 MG tablet Take 100 mg by mouth daily.       No current facility-administered medications for this visit.    ROS- all systems are reviewed and negative except as per HPI above  Physical Exam: Vitals:   05/19/16 1007  BP: 108/80  Pulse: 73  Weight: 243 lb 12.8 oz (110.6 kg)  Height: 6' 2.5" (1.892 m)    GEN- The patient is well appearing, alert and oriented x 3 today.   Head- normocephalic, atraumatic Eyes-  Sclera clear, conjunctiva pink Ears- hearing intact Oropharynx- clear Lungs- Clear to ausculation bilaterally, normal work of breathing Heart- Regular rate and rhythm, no murmurs, rubs or gallops, PMI not laterally displaced GI- soft, NT, ND, + BS Extremities- no clubbing, cyanosis, or edema  ekg today reveals sinus rhythm 73 bpm, PR 252 msec, otherwise normal ekg  Labs from PCP are reviewed  Assessment and Plan:  1. CAD Stable, without ischemic symptoms No further workup at this time  2. First degree AV block stable No symptoms of more advanced AV block and no indication for pacing at this time  3.  HL Recent lipids are reviewed  4. HTN Stable No change required today  Return in 1 year He should contact my office if problems arise in the interim  Thompson Grayer MD, [facility]

## 2016-05-24 ENCOUNTER — Telehealth: Payer: Self-pay | Admitting: Internal Medicine

## 2016-05-24 NOTE — Telephone Encounter (Signed)
New message     Patient calling talk with nurse about requesting to Dr. Rayann Heman  - patient did not go into details.

## 2016-05-24 NOTE — Telephone Encounter (Signed)
Will be receiving an rx from Dr Corky Sing office for mouth appliance for Dr Rayann Heman to sign

## 2016-05-25 NOTE — Telephone Encounter (Signed)
Follow up      Calling to see if you received fax from Dr [facility] office.  Please call and let her know

## 2016-05-25 NOTE — Telephone Encounter (Signed)
Spoke with patient and let him know I did not have the fax as of yet

## 2016-05-27 ENCOUNTER — Other Ambulatory Visit: Payer: Self-pay | Admitting: Family Medicine

## 2016-05-31 ENCOUNTER — Other Ambulatory Visit: Payer: Self-pay | Admitting: Internal Medicine

## 2016-05-31 NOTE — Telephone Encounter (Signed)
Left message that the letter was signed and I will fax over

## 2016-07-05 DIAGNOSIS — Z794 Long term (current) use of insulin: Secondary | ICD-10-CM | POA: Diagnosis not present

## 2016-07-05 DIAGNOSIS — Z961 Presence of intraocular lens: Secondary | ICD-10-CM | POA: Diagnosis not present

## 2016-07-05 DIAGNOSIS — H33051 Total retinal detachment, right eye: Secondary | ICD-10-CM | POA: Diagnosis not present

## 2016-07-05 DIAGNOSIS — E119 Type 2 diabetes mellitus without complications: Secondary | ICD-10-CM | POA: Diagnosis not present

## 2016-07-05 DIAGNOSIS — H524 Presbyopia: Secondary | ICD-10-CM | POA: Diagnosis not present

## 2016-07-07 DIAGNOSIS — L821 Other seborrheic keratosis: Secondary | ICD-10-CM | POA: Diagnosis not present

## 2016-07-07 DIAGNOSIS — C4402 Squamous cell carcinoma of skin of lip: Secondary | ICD-10-CM | POA: Diagnosis not present

## 2016-07-07 DIAGNOSIS — L905 Scar conditions and fibrosis of skin: Secondary | ICD-10-CM | POA: Diagnosis not present

## 2016-07-07 DIAGNOSIS — D485 Neoplasm of uncertain behavior of skin: Secondary | ICD-10-CM | POA: Diagnosis not present

## 2016-07-07 DIAGNOSIS — D1801 Hemangioma of skin and subcutaneous tissue: Secondary | ICD-10-CM | POA: Diagnosis not present

## 2016-07-07 DIAGNOSIS — L570 Actinic keratosis: Secondary | ICD-10-CM | POA: Diagnosis not present

## 2016-07-19 DIAGNOSIS — Z85828 Personal history of other malignant neoplasm of skin: Secondary | ICD-10-CM | POA: Diagnosis not present

## 2016-07-19 DIAGNOSIS — C4402 Squamous cell carcinoma of skin of lip: Secondary | ICD-10-CM | POA: Diagnosis not present

## 2016-08-24 ENCOUNTER — Other Ambulatory Visit: Payer: Self-pay | Admitting: Endocrinology

## 2016-08-25 NOTE — Telephone Encounter (Signed)
Please refill x 1 Ov is due  

## 2016-08-30 ENCOUNTER — Other Ambulatory Visit: Payer: Self-pay | Admitting: Emergency Medicine

## 2016-08-30 ENCOUNTER — Telehealth: Payer: Self-pay | Admitting: Family Medicine

## 2016-08-30 MED ORDER — PIOGLITAZONE HCL 45 MG PO TABS: 45.0000 mg | ORAL_TABLET | Freq: Every day | ORAL | 3 refills | Status: DC

## 2016-08-30 NOTE — Telephone Encounter (Signed)
Pt need new Rx for pioglitazone  Pharm:  [facility] [phone]

## 2016-08-30 NOTE — Telephone Encounter (Signed)
Rx sent to pharmacy   

## 2016-10-28 ENCOUNTER — Other Ambulatory Visit: Payer: Self-pay | Admitting: Endocrinology

## 2016-10-28 ENCOUNTER — Other Ambulatory Visit: Payer: Self-pay | Admitting: Family Medicine

## 2016-10-29 ENCOUNTER — Other Ambulatory Visit: Payer: Self-pay | Admitting: Endocrinology

## 2016-10-29 NOTE — Telephone Encounter (Signed)
Denied.  Filled for 1 year in 01/2016.  Refills should be on file.  Message sent to the pharmacy.

## 2016-11-15 ENCOUNTER — Other Ambulatory Visit: Payer: Self-pay | Admitting: Family Medicine

## 2016-11-15 MED ORDER — GLUCOSE BLOOD VI STRP: ORAL_STRIP | 3 refills | Status: DC

## 2016-11-15 NOTE — Telephone Encounter (Signed)
Sent to the pharmacy by e-scribe. 

## 2016-12-08 IMAGING — CR DG CHEST 1V PORT
1 series · 1 of 1 positions shown · non-contrast
Comparison: 09/14/2013

CLINICAL DATA: Fever.  History of CABG, hypertension and diabetes.

EXAM:
PORTABLE CHEST - 1 VIEW

[AP]
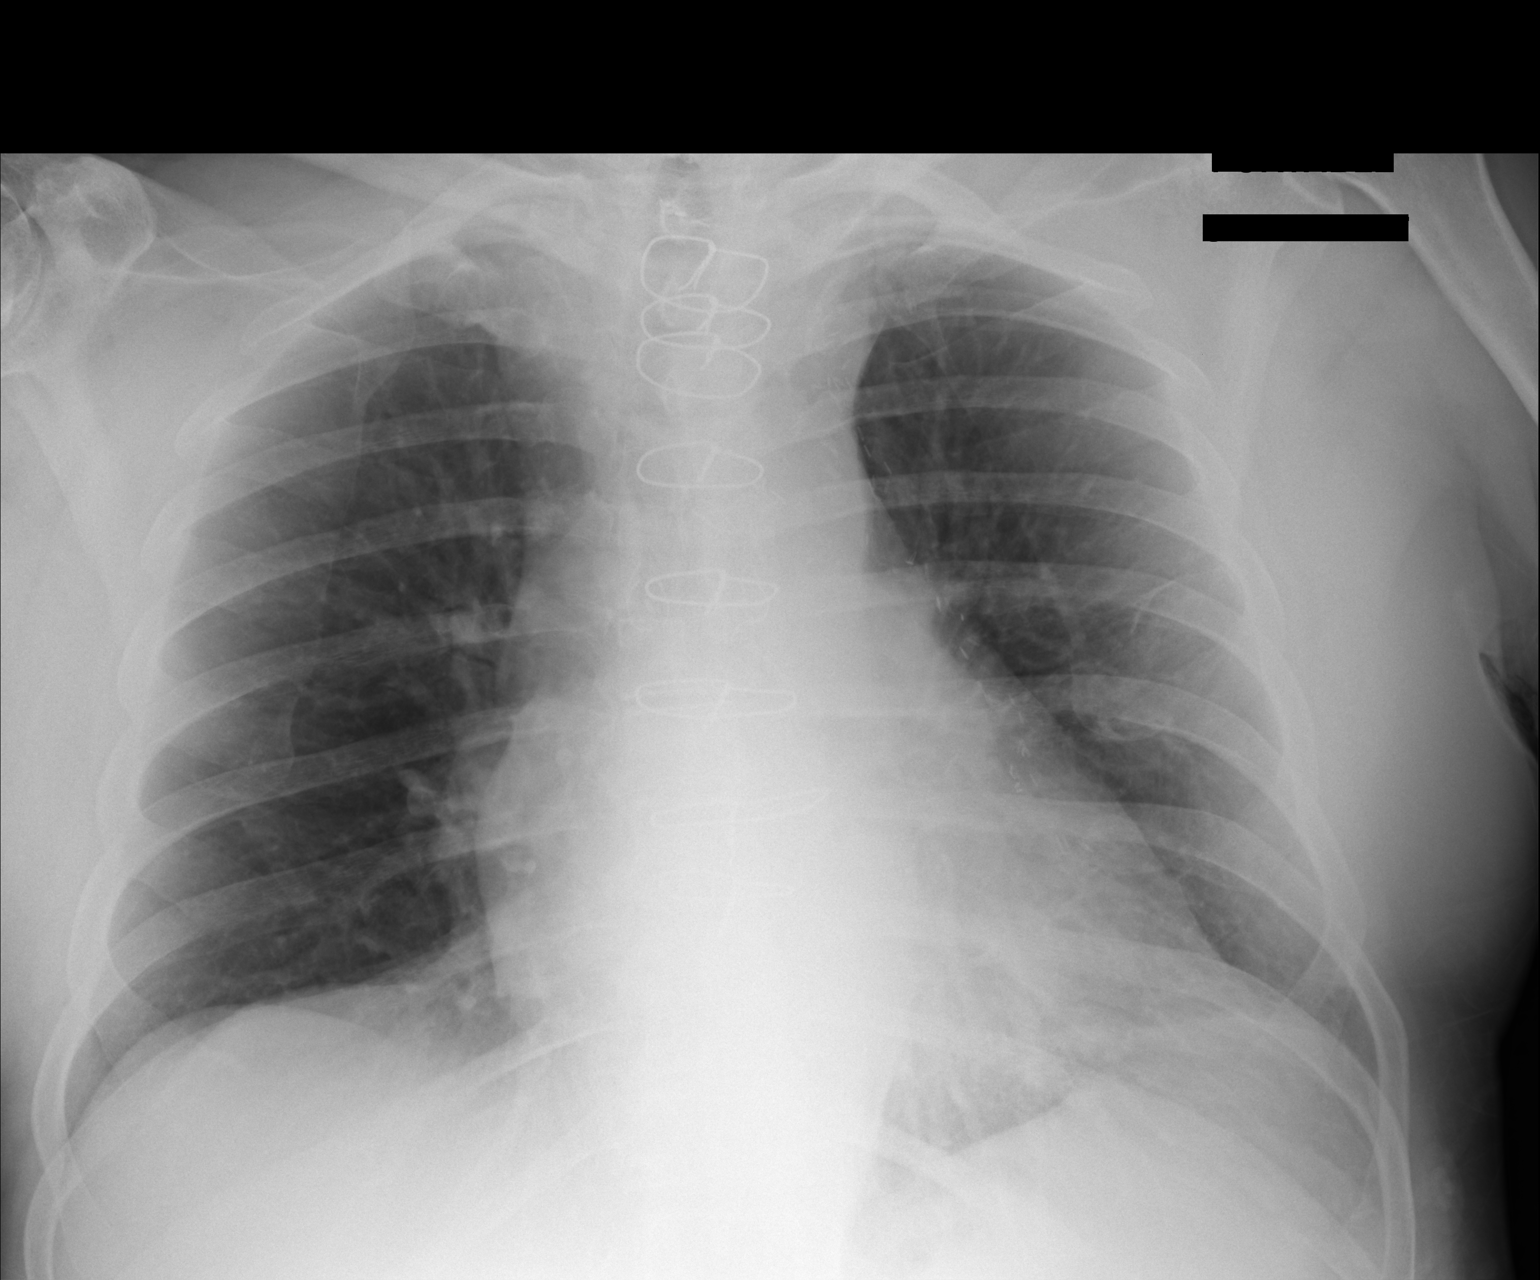

[1 of 1 positions shown; findings below may reference images not displayed]

FINDINGS: Patient is post median sternotomy and CABG. There is cardiomegaly
without appears progressed from prior exam. No confluent airspace
disease to suggest pneumonia. There is peribronchial cuffing that
may reflect bronchitis. No pleural effusion or pneumothorax. No
acute osseous abnormality is seen.
IMPRESSION: 1. Mild cardiomegaly, new from prior exam.
2. Peribronchial cuffing, may reflect bronchitis. There is no
evidence of pneumonia.

## 2016-12-08 IMAGING — US US RENAL
1 series · 14 of 25 positions shown · non-contrast
Comparison: None

CLINICAL DATA: Acute renal failure, hypertension, coronary artery
disease, type 2 diabetes, history acute glomerulonephritis,
hepatitis A and B

EXAM:
RENAL/URINARY TRACT ULTRASOUND COMPLETE

[Series 1: us renal · 0.22mm/px · 14 of 35 slices shown]
[im 1/35]
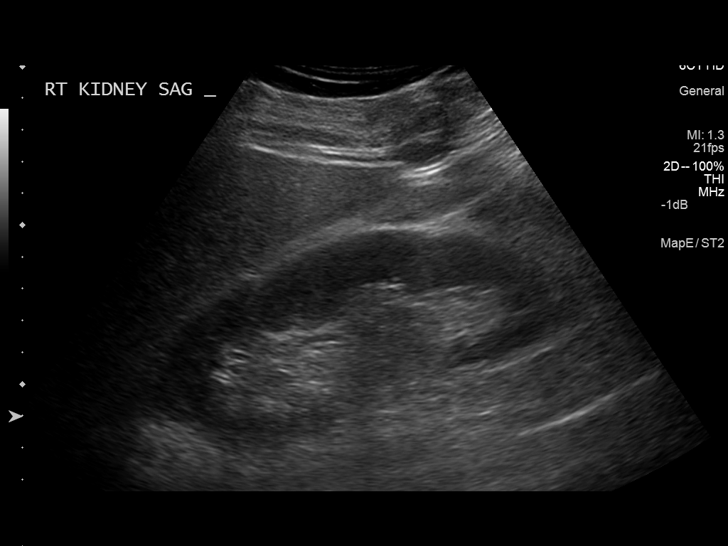
[im 3/35]
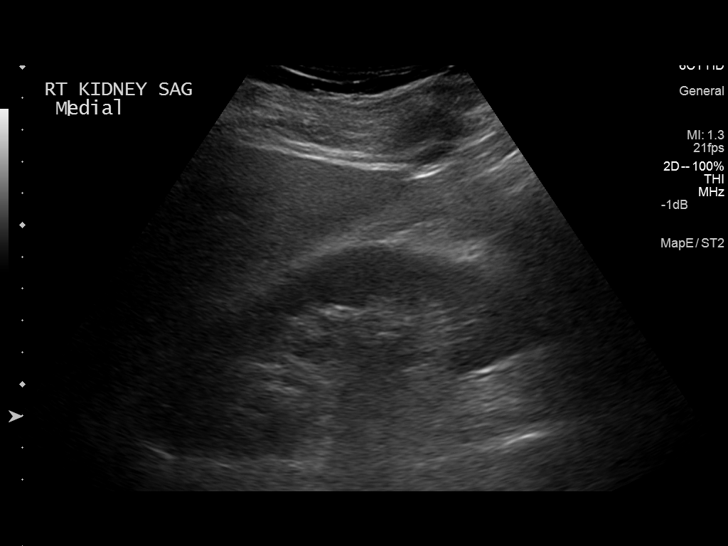
[im 6/35]
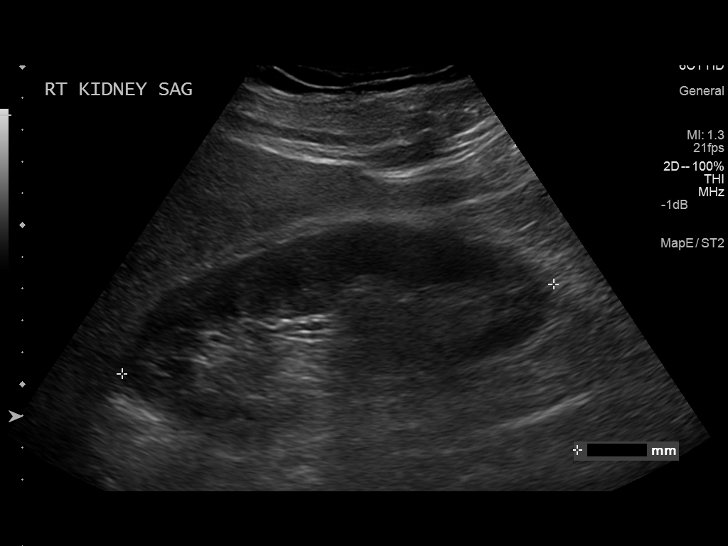
[im 9/35]
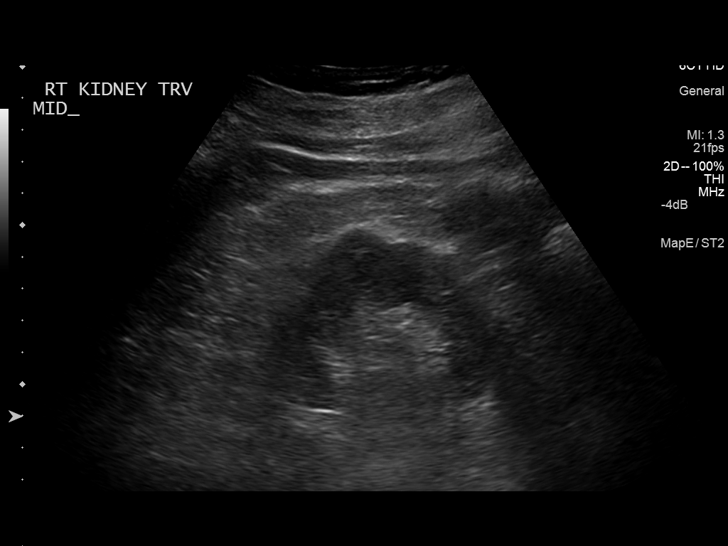
[im 12/35]
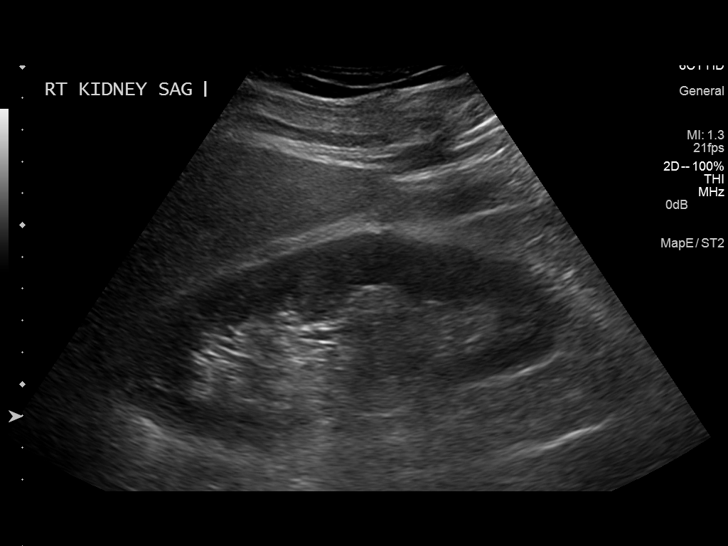
[im 13/35]
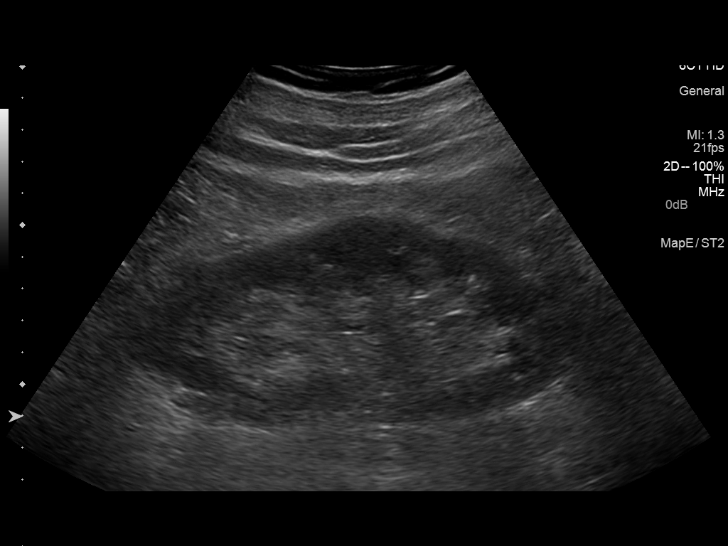
[im 16/35]
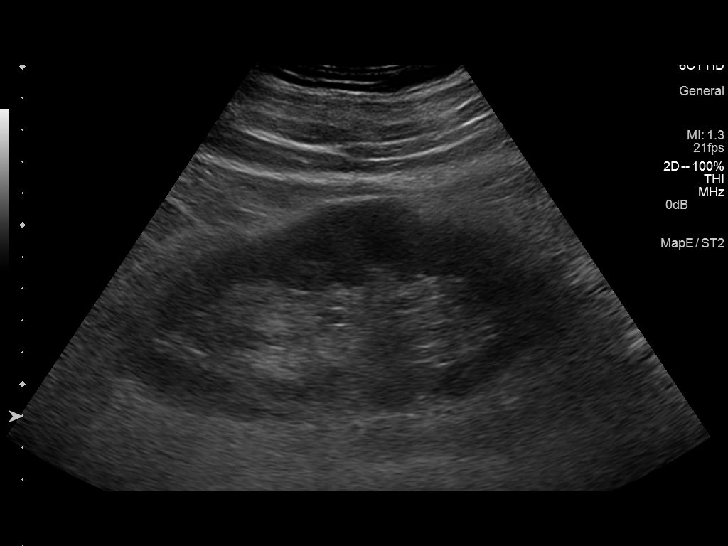
[im 19/35]
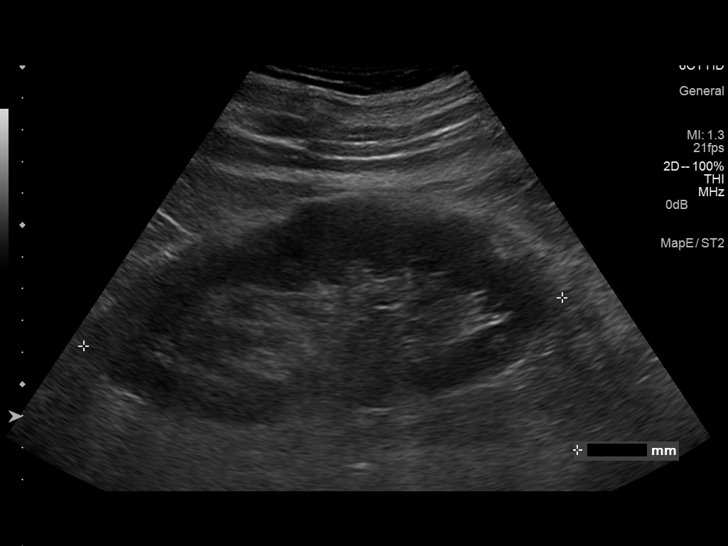
[im 22/35]
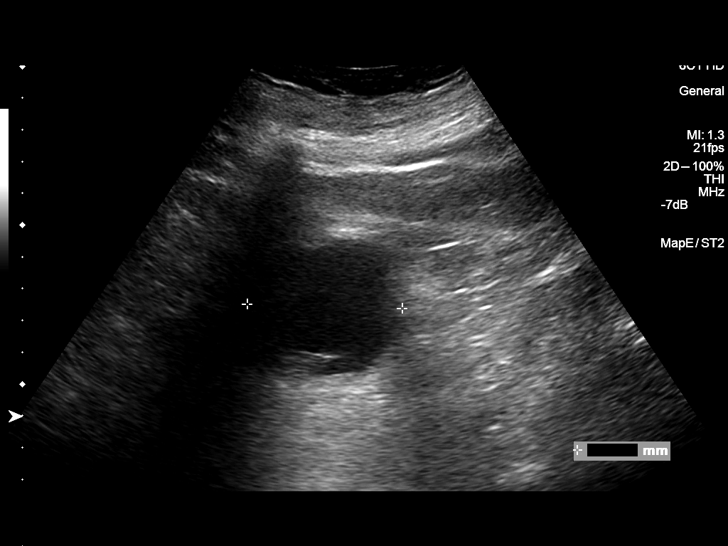
[im 23/35]
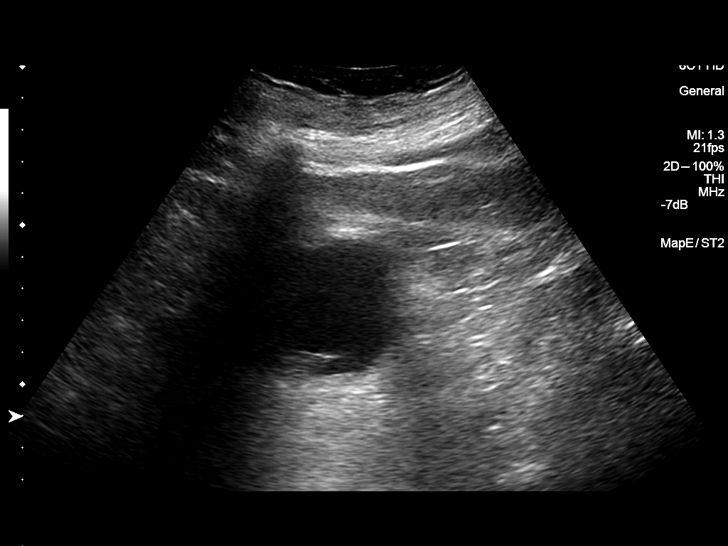
[im 26/35]
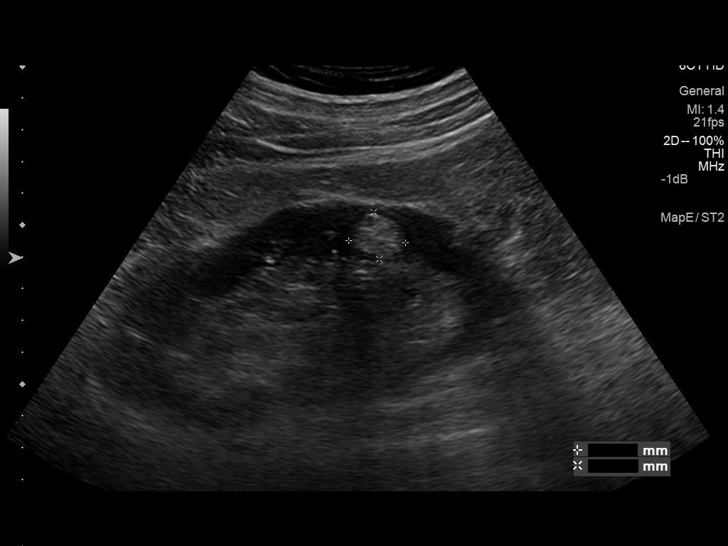
[im 29/35]
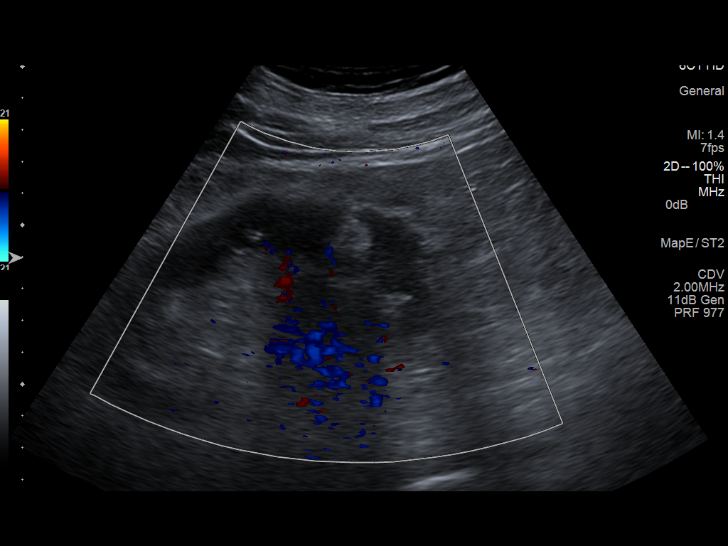
[im 32/35]
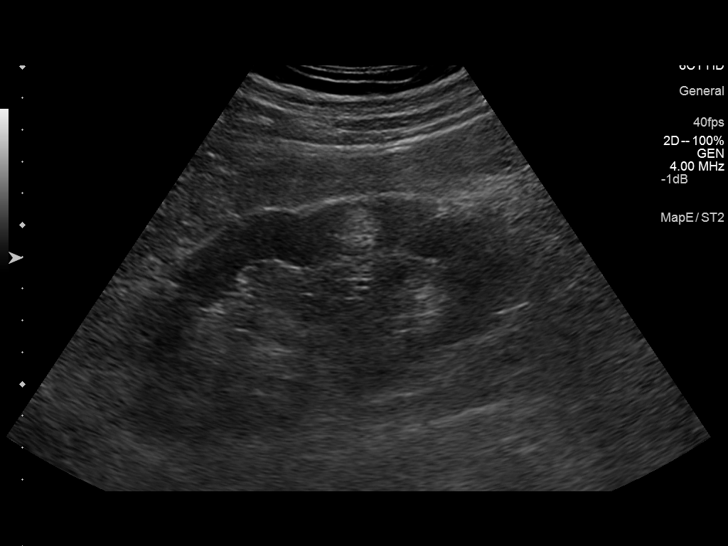
[im 35/35]
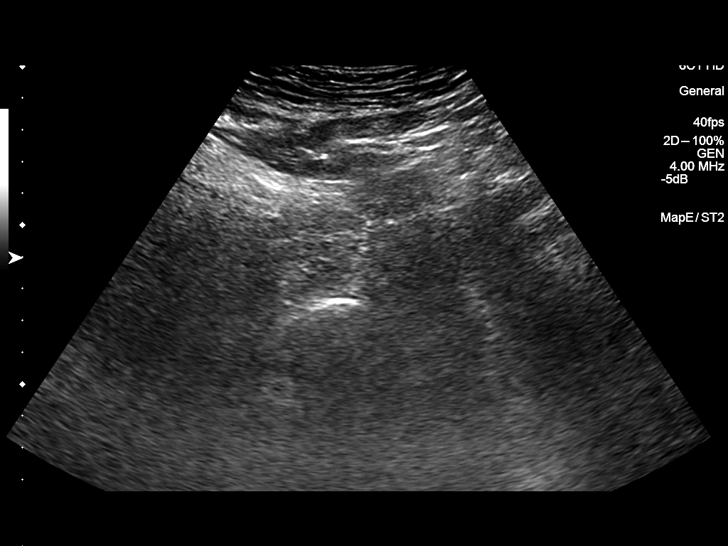

[14 of 25 positions shown; findings below may reference images not displayed]

FINDINGS: Right Kidney:

Length: 14.2 cm. Normal cortical thickness and echogenicity. No
mass, hydronephrosis or shadowing calcification. No perinephric
fluid.

Left Kidney:

Length: 14.9 cm. Cyst at upper pole 5.4 x 4.6 x 4.9 cm, simple
features. Additional hyperechoic nodule at mid LEFT kidney medially
1.8 x 1.5 x 1.2 cm without shadowing most consistent with an
angiomyolipoma. No hydronephrosis or shadowing calcification.

Bladder:

Decompressed by Foley catheter.
IMPRESSION: No evidence of hydronephrosis.

Cyst at upper pole LEFT kidney 5.4 cm greatest diameter.

Hyperechoic nodule mid LEFT kidney 1.8 cm greatest diameter likely
small angiomyolipoma.

## 2016-12-28 ENCOUNTER — Encounter: Payer: Self-pay | Admitting: Family Medicine

## 2016-12-28 ENCOUNTER — Ambulatory Visit (INDEPENDENT_AMBULATORY_CARE_PROVIDER_SITE_OTHER): Payer: Medicare Other | Admitting: Family Medicine

## 2016-12-28 VITALS — BP 108/72 | Temp 97.8°F | Ht 73.0 in | Wt 241.0 lb

## 2016-12-28 DIAGNOSIS — E785 Hyperlipidemia, unspecified: Secondary | ICD-10-CM

## 2016-12-28 DIAGNOSIS — N401 Enlarged prostate with lower urinary tract symptoms: Secondary | ICD-10-CM

## 2016-12-28 DIAGNOSIS — E118 Type 2 diabetes mellitus with unspecified complications: Secondary | ICD-10-CM | POA: Diagnosis not present

## 2016-12-28 DIAGNOSIS — I10 Essential (primary) hypertension: Secondary | ICD-10-CM | POA: Diagnosis not present

## 2016-12-28 DIAGNOSIS — E038 Other specified hypothyroidism: Secondary | ICD-10-CM

## 2016-12-28 DIAGNOSIS — R351 Nocturia: Secondary | ICD-10-CM | POA: Diagnosis not present

## 2016-12-28 DIAGNOSIS — K219 Gastro-esophageal reflux disease without esophagitis: Secondary | ICD-10-CM | POA: Diagnosis not present

## 2016-12-28 DIAGNOSIS — M109 Gout, unspecified: Secondary | ICD-10-CM | POA: Diagnosis not present

## 2016-12-28 LAB — CBC WITH DIFFERENTIAL/PLATELET
BASOS ABS: 0 10*3/uL (ref 0.0–0.1)
Basophils Relative: 0.3 % (ref 0.0–3.0)
EOS ABS: 0 10*3/uL (ref 0.0–0.7)
Eosinophils Relative: 1 % (ref 0.0–5.0)
HEMATOCRIT: 43.9 % (ref 39.0–52.0)
Hemoglobin: 15.3 g/dL (ref 13.0–17.0)
LYMPHS PCT: 29.4 % (ref 12.0–46.0)
Lymphs Abs: 1.3 10*3/uL (ref 0.7–4.0)
MCHC: 34.8 g/dL (ref 30.0–36.0)
MCV: 90.1 fl (ref 78.0–100.0)
MONO ABS: 0.4 10*3/uL (ref 0.1–1.0)
Monocytes Relative: 9.5 % (ref 3.0–12.0)
NEUTROS ABS: 2.7 10*3/uL (ref 1.4–7.7)
Neutrophils Relative %: 59.8 % (ref 43.0–77.0)
PLATELETS: 210 10*3/uL (ref 150.0–400.0)
RBC: 4.87 Mil/uL (ref 4.22–5.81)
RDW: 14.4 % (ref 11.5–15.5)
WBC: 4.5 10*3/uL (ref 4.0–10.5)

## 2016-12-28 LAB — HEPATIC FUNCTION PANEL
ALBUMIN: 4.7 g/dL (ref 3.5–5.2)
ALK PHOS: 51 U/L (ref 39–117)
ALT: 16 U/L (ref 0–53)
AST: 20 U/L (ref 0–37)
Bilirubin, Direct: 0.2 mg/dL (ref 0.0–0.3)
TOTAL PROTEIN: 6.6 g/dL (ref 6.0–8.3)
Total Bilirubin: 0.8 mg/dL (ref 0.2–1.2)

## 2016-12-28 LAB — POCT URINALYSIS DIPSTICK
Bilirubin, UA: NEGATIVE
GLUCOSE UA: NEGATIVE
Ketones, UA: NEGATIVE
Leukocytes, UA: NEGATIVE
NITRITE UA: NEGATIVE
PH UA: 6 (ref 5.0–8.0)
PROTEIN UA: NEGATIVE
RBC UA: NEGATIVE
Spec Grav, UA: 1.025 (ref 1.010–1.025)
UROBILINOGEN UA: 0.2 U/dL

## 2016-12-28 LAB — BASIC METABOLIC PANEL
BUN: 18 mg/dL (ref 6–23)
CALCIUM: 10.1 mg/dL (ref 8.4–10.5)
CHLORIDE: 101 meq/L (ref 96–112)
CO2: 30 meq/L (ref 19–32)
CREATININE: 0.93 mg/dL (ref 0.40–1.50)
GFR: 83.36 mL/min (ref >60.00)
GLUCOSE: 185 mg/dL — AB (ref 70–99)
Potassium: 4 mEq/L (ref 3.5–5.1)
Sodium: 140 mEq/L (ref 135–145)

## 2016-12-28 LAB — LIPID PANEL
CHOLESTEROL: 146 mg/dL (ref 0–200)
HDL: 43.3 mg/dL (ref >39.00)
LDL Cholesterol: 78 mg/dL (ref 0–99)
NonHDL: 102.36
Total CHOL/HDL Ratio: 3
Triglycerides: 121 mg/dL (ref 0.0–149.0)
VLDL: 24.2 mg/dL (ref 0.0–40.0)

## 2016-12-28 LAB — PSA: PSA: 3.55 ng/mL (ref 0.10–4.00)

## 2016-12-28 LAB — MICROALBUMIN / CREATININE URINE RATIO
Creatinine,U: 123.7 mg/dL
Microalb Creat Ratio: 0.6 mg/g (ref 0.0–30.0)
Microalb, Ur: <0.7 mg/dL (ref 0.0–1.9)

## 2016-12-28 LAB — HEMOGLOBIN A1C: HEMOGLOBIN A1C: 7.9 % — AB (ref 4.6–6.5)

## 2016-12-28 LAB — TSH: TSH: 2.99 u[IU]/mL (ref 0.35–4.50)

## 2016-12-28 MED ORDER — ALLOPURINOL 300 MG PO TABS: 300.0000 mg | ORAL_TABLET | Freq: Every day | ORAL | 4 refills | Status: DC

## 2016-12-28 MED ORDER — INSULIN GLARGINE 100 UNIT/ML ~~LOC~~ SOLN: SUBCUTANEOUS | 10 refills | Status: DC

## 2016-12-28 MED ORDER — LORAZEPAM 0.5 MG PO TABS: 0.5000 mg | ORAL_TABLET | Freq: Two times a day (BID) | ORAL | 2 refills | Status: DC | PRN

## 2016-12-28 MED ORDER — GLIPIZIDE 5 MG PO TABS: 5.0000 mg | ORAL_TABLET | Freq: Two times a day (BID) | ORAL | 4 refills | Status: DC

## 2016-12-28 MED ORDER — LEVOTHYROXINE SODIUM 100 MCG PO TABS: 100.0000 ug | ORAL_TABLET | ORAL | 4 refills | Status: DC

## 2016-12-28 MED ORDER — OMEPRAZOLE 20 MG PO CPDR: 20.0000 mg | DELAYED_RELEASE_CAPSULE | Freq: Every day | ORAL | 4 refills | Status: DC | PRN

## 2016-12-28 MED ORDER — PIOGLITAZONE HCL 45 MG PO TABS: 45.0000 mg | ORAL_TABLET | Freq: Every day | ORAL | 3 refills | Status: DC

## 2016-12-28 MED ORDER — NITROGLYCERIN 0.4 MG SL SUBL: 0.4000 mg | SUBLINGUAL_TABLET | SUBLINGUAL | 2 refills | Status: DC | PRN

## 2016-12-28 MED ORDER — METFORMIN HCL 1000 MG PO TABS: 1000.0000 mg | ORAL_TABLET | Freq: Two times a day (BID) | ORAL | 4 refills | Status: DC

## 2016-12-28 NOTE — Patient Instructions (Signed)
Continue current medications  Labs today............ I will call you if there is anything abnormal  Follow-up in one year's for general checkup sooner if any problems

## 2016-12-28 NOTE — Progress Notes (Signed)
Jonathan [name] is a 81 year old married male nonsmoker retired [employer] features editor who comes in today for evaluation of multiple issues  He has a history of gout. He takes allopurinol daily  He has a history of type 1 diabetes.  His last A1c was 7.0% May 2007. His current diabetes medications are Glucotrol 5 mg twice a day, metformin 1000 mg twice a day, and Lantus up to 30 units daily when necessary. He says some days his blood sugars low when he doesn't take any insulin. He also does not take his blood sugar every day.  He takes Lotensin 20-25 daily, Toprol 12.5 mg daily for blood pressure control. Cardiology recently decreased his beta blocker to 25 mg a day to 12.5 because his BP was too low. BP today 100/72. He says is not lightheaded when he stands up  He takes Zyrtec for allergic rhinitis  He takes 20 mg of Lasix when necessary for fluid retention  He takes Ativan 0.5 twice a day for anxiety when necessary  He takes MYRBETRIQ 50 mg daily from his urologist for BPH and urinary tract symptoms.  He had bypass surgery 13 years ago remains asymptomatic.  He takes Prilosec when necessary for reflux, Ditropan XL 15 mg from his urologist also Actos 45 mg daily and Zocor 40 mg daily because of history of hyperlipidemia.  He gets routine eye care. 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 He had a retinal detachment twice. Vision left eye fairly normal. He gets routine dental care, colonoscopy no longer indicated at age 27.  Vaccinations up-to-date except he is due the new shingles vaccine.  14 point review of systems reviewed and otherwise negative  EKG was done by cardiology therefore not repeated here.  BP 108/72 (BP Location: Left Arm)   Temp 97.8 F (36.6 C) (Oral)   Ht 6\' 1"  (1.854 m)   Wt 241 lb (109.3 kg)   BMI 31.80 kg/m  Examination of the HEENT shows the right eye to have a severe dense cataract not able to visualize the retina. Left eye cataract but able to visualize the retina. Neck was supple  thyroid is not enlarged no carotid bruits cardiopulmonary exam normal except for scar middle the chest from previous bypass surgery. Abdominal exam the abdomen is somewhat obese. Liver spleen kidney stone large I can palpate no masses. No aortic bruits. Peripheral pulses normal. Skin normal except for numerous seborrheic keratosis capillary hemangiomas. He had a skin cancer move from his left lower lip last year. It turned out to be a basal cell. He sees his dermatologist on a regular basis.  #1 history of gout......... continue allopurinol  #2 history of hypertension.....[name] Kitchen continue current meds  #3 status post coronary bypass surgery 13 years ago asymptomatic  #4 diabetes type 1........ continue current treatment program check labs  #5 hypothyroidism.....[name] Kitchen continue Synthroid  #6 unit tract symptoms of leakage and obstruction....... followed by urology  #7 allergic rhinitis....... continue Zyrtec  #8 venous insufficiency.... Lasix when necessary  #9 blind right eye  #10 status post bilateral knee replacements

## 2016-12-31 ENCOUNTER — Telehealth: Payer: Self-pay | Admitting: Family Medicine

## 2016-12-31 NOTE — Telephone Encounter (Signed)
Follow-up BMP and A1c the third week in September,,,,,,,,, see [place] the fourth week in September

## 2017-02-09 ENCOUNTER — Other Ambulatory Visit: Payer: Self-pay | Admitting: Adult Health

## 2017-02-10 NOTE — Telephone Encounter (Signed)
He can have 30 days. Needs an office visit for further refills

## 2017-02-10 NOTE — Telephone Encounter (Signed)
Ok to refill 

## 2017-03-22 ENCOUNTER — Encounter: Payer: Self-pay | Admitting: Internal Medicine

## 2017-03-22 ENCOUNTER — Telehealth: Payer: Self-pay | Admitting: Internal Medicine

## 2017-03-22 NOTE — Telephone Encounter (Signed)
Returned call to patient.  He woke up this morning and his HR was in the running around 50.  He felt like he was "dropping beats"  He is asymptomatic, however his wife has the Alive-Cor app for her phone and he thinks he "may have some heart block going on".  He will email the tracings.  When he saw this he became anxious as he says his HR would not go up.  He did get up and move around and his rates are up to 65 and he feels fine but would just like to see Dr Rayann Heman.  He is aware he will not be back in the office until Mon 03/28/17.  I have made him an appointment for 8:45am.  If he becomes symptomatic he will go to the ER.

## 2017-03-22 NOTE — Telephone Encounter (Signed)
New Message    Pt states he is having first degree heart block, all he has is a rhythm strip , he cant get heart rate above 60 , his is having bradycardia and he wants brought in today with Dr Rayann Heman offered pt appt for Friday with APP Renee , he refused. Wants Claiborne Billings to call him ASAP

## 2017-03-24 NOTE — Telephone Encounter (Signed)
Dr Caryl Comes reviewed AliveCor strips and feels it is afib.  He recommended the patient be seen to obtain an EKG to determine if he is still in afib and discuss anticoagulation.  I have called the patient to discuss recommendations and he says his HR is " regular now and may have an occasional dropped beat".  I let him know Dr Olin Pia recommendations and he says he would like to keep his appointment with Dr Rayann Heman on Monday at 8:45am.

## 2017-03-28 ENCOUNTER — Encounter: Payer: Self-pay | Admitting: Internal Medicine

## 2017-03-28 ENCOUNTER — Ambulatory Visit (INDEPENDENT_AMBULATORY_CARE_PROVIDER_SITE_OTHER): Payer: Medicare Other | Admitting: Internal Medicine

## 2017-03-28 VITALS — BP 136/80 | HR 69 | Ht 73.0 in | Wt 250.8 lb

## 2017-03-28 DIAGNOSIS — I440 Atrioventricular block, first degree: Secondary | ICD-10-CM | POA: Diagnosis not present

## 2017-03-28 DIAGNOSIS — I441 Atrioventricular block, second degree: Secondary | ICD-10-CM

## 2017-03-28 DIAGNOSIS — I10 Essential (primary) hypertension: Secondary | ICD-10-CM

## 2017-03-28 DIAGNOSIS — I25810 Atherosclerosis of coronary artery bypass graft(s) without angina pectoris: Secondary | ICD-10-CM | POA: Diagnosis not present

## 2017-03-28 NOTE — Progress Notes (Signed)
PCP: Dorena Cookey, MD Primary EP: Dr Ronnald Ramp is a 81 y.o. male who presents today for routine electrophysiology followup.  Since last being seen in our clinic, the patient reports doing very well.  He did have "skipped beats" last weak for which he used AliveCor.  He was found to have mobitz I second degree AV block.  Symptoms have since resolved.  Today, he denies symptoms of chest pain, shortness of breath,  lower extremity edema, dizziness, presyncope, or syncope.  The patient is otherwise without complaint today.   Past Medical History:  Diagnosis Date  . Benign prostatic hypertrophy   . CAD (coronary artery disease)    CABG 2005 by Dr Cyndia Bent  . DJD (degenerative joint disease)    bilateral knee, gout  . GERD (gastroesophageal reflux disease)   . Glomerulonephritis acute    at age 11, due to beta strep, resolved, renal calculi- passed spontaneously  . Gout   . Hepatitis A 1950  . Hepatitis B 1980's  . HOH (hard of hearing)    wears hearing aids  . Hyperlipidemia   . Hypertension   . Hypothyroidism   . Incontinence of urine   . Knee joint replacement by other means   . Personal history of other infectious and parasitic disease   . Sleep apnea 1990's   "before uvulectomy"  . Squamous cell carcinoma    "lots on my arms & face" (09/21/2013)  . Sun-damaged skin   . Type II diabetes mellitus ([place])    Past Surgical History:  Procedure Laterality Date  . CARDIAC CATHETERIZATION    . CATARACT EXTRACTION W/ INTRAOCULAR LENS  IMPLANT, BILATERAL Bilateral   . CORONARY ARTERY BYPASS GRAFT  01/23/04   Dr Cyndia Bent  . EYE SURGERY Right    detached retina ,blind in right eye  . INGUINAL HERNIA REPAIR Bilateral 1980's - 1990's  . MASTOIDECTOMY Right 1943  . QUADRICEPS REPAIR Right 2009  . RETINAL DETACHMENT SURGERY Right X 2  . TONSILLECTOMY  ~ 1943  . TOTAL KNEE ARTHROPLASTY Left 09/18/2013   Procedure: TOTAL KNEE ARTHROPLASTY;  Surgeon: Hessie Dibble, MD;   Location: [place];  Service: Orthopedics;  Laterality: Left;  . TOTAL KNEE ARTHROPLASTY Right 08/22/2014   Procedure: RIGHT TOTAL KNEE ARTHROPLASTY;  Surgeon: Hessie Dibble, MD;  Location: [place];  Service: Orthopedics;  Laterality: Right;  . UMBILICAL HERNIA REPAIR    . UVULOPALATOPLASTY  1990's   ENT MD    ROS- all systems are reviewed and negatives except as per HPI above  Current Outpatient Prescriptions  Medication Sig Dispense Refill  . allopurinol (ZYLOPRIM) 300 MG tablet Take 1 tablet (300 mg total) by mouth daily. 100 tablet 4  . aspirin EC 81 MG tablet Take 81 mg by mouth daily.    . benazepril-hydrochlorthiazide (LOTENSIN HCT) 20-25 MG tablet TAKE 1 TABLET EVERY DAY 30 tablet 0  . cetirizine (ZYRTEC) 10 MG tablet Take 10 mg by mouth daily as needed for allergies.    . furosemide (LASIX) 20 MG tablet Take 1 tablet (20 mg total) by mouth daily as needed for fluid or edema. 100 tablet 3  . glipiZIDE (GLUCOTROL) 5 MG tablet Take 1 tablet (5 mg total) by mouth 2 (two) times daily. 200 tablet 4  . glucose blood (FREESTYLE LITE) test strip USE TWICE A DAY AS DIRECTED Dx Code. E11.9 180 each 3  . insulin glargine (LANTUS) 100 UNIT/ML injection INJECT  30 UNITS SUBCUTANEOUSLY  AT BEDTIME (DISCARD AND BEGIN A NEW VIAL EVERY 28 DAYS) 30 mL 10  . levothyroxine (SYNTHROID, LEVOTHROID) 100 MCG tablet Take 1 tablet (100 mcg total) by mouth every morning. 100 tablet 4  . metFORMIN (GLUCOPHAGE) 1000 MG tablet Take 1 tablet (1,000 mg total) by mouth 2 (two) times daily with a meal. 200 tablet 4  . mirabegron ER (MYRBETRIQ) 50 MG TB24 tablet Take 50 mg by mouth daily before breakfast.     . Multiple Vitamin (MULTIVITAMIN PO) Take 1 tablet by mouth daily.      . Omega-3 Fatty Acids (FISH OIL) 1000 MG CAPS Take 1,000 mg by mouth daily.     [name] Kitchen omeprazole (PRILOSEC) 20 MG capsule Take 1 capsule (20 mg total) by mouth daily as needed (heartburn or acid reflux). 100 capsule 4  . pioglitazone (ACTOS) 45 MG  tablet Take 1 tablet (45 mg total) by mouth daily. 90 tablet 3  . polyethylene glycol (MIRALAX / GLYCOLAX) packet Take 17 g by mouth daily as needed for moderate constipation.    . Probiotic Product (ALIGN) 4 MG CAPS Take 1 capsule (4 mg total) by mouth daily. 90 capsule 3  . simvastatin (ZOCOR) 40 MG tablet TAKE 1 TABLET AT BEDTIME 30 tablet 0  . LORazepam (ATIVAN) 0.5 MG tablet Take 1 tablet (0.5 mg total) by mouth 2 (two) times daily as needed for anxiety. (Patient not taking: Reported on 03/28/2017) 60 tablet 2  . metoprolol succinate (TOPROL-XL) 25 MG 24 hr tablet TAKE 1/2 TABLET EVERY DAY (Patient not taking: Reported on 03/28/2017) 45 tablet 3  . nitroGLYCERIN (NITROSTAT) 0.4 MG SL tablet Place 1 tablet (0.4 mg total) under the tongue every 5 (five) minutes x 3 doses as needed for chest pain. (Patient not taking: Reported on 03/28/2017) 25 tablet 2   No current facility-administered medications for this visit.     Physical Exam: Vitals:   03/28/17 0843  BP: 136/80  Pulse: 69  SpO2: 94%  Weight: 250 lb 12.8 oz (113.8 kg)  Height: 6\' 1"  (1.854 m)    GEN- The patient is well appearing, alert and oriented x 3 today.   Head- normocephalic, atraumatic Eyes-  Sclera clear, conjunctiva pink Ears- hearing intact Oropharynx- clear Lungs- Clear to ausculation bilaterally, normal work of breathing Heart- Regular rate and rhythm, no murmurs, rubs or gallops, PMI not laterally displaced GI- soft, NT, ND, + BS Extremities- no clubbing, cyanosis, or edema  EKG tracing ordered today is personally reviewed and shows sinus rhythm 70 bpm, PR 286 msec, nonspecific ST/T changes,  Rhythm strip reveals mobitz I second degree AV block  alivecor strips are reviewed which I believe reveal sinus rhythm with first degree AV block and mobitz I second degree AV block  Assessment and Plan:  1. First degree AV block, mobitz I second degree AV block Minimal symptoms Will follow clinically Stop  metoprolol I have offered ETT which he declines at this time.  He exercises regularly and will let me know of any changes in symptoms or heart rates with activity.  2. CAD Stable, without ischemic symptoms No changes today  3. HTN Stable No change required today  4. HL Stable No change required today  Return to see me in a year unless problems arise in the interim  Thompson Grayer MD, [facility] 03/28/2017 9:04 AM

## 2017-03-28 NOTE — Patient Instructions (Signed)
Medication Instructions:  Your physician has recommended you make the following change in your medication:   1) Stop Metoprolol   Labwork: None ordered   Testing/Procedures: None ordered   Follow-Up: Your physician wants you to follow-up in: 12 months with Dr Vallery Ridge will receive a reminder letter in the mail two months in advance. If you don't receive a letter, please call our office to schedule the follow-up appointment.   Any Other Special Instructions Will Be Listed Below (If Applicable).     If you need a refill on your cardiac medications before your next appointment, please call your pharmacy.

## 2017-04-25 DIAGNOSIS — R3915 Urgency of urination: Secondary | ICD-10-CM | POA: Diagnosis not present

## 2017-04-25 DIAGNOSIS — N3941 Urge incontinence: Secondary | ICD-10-CM | POA: Diagnosis not present

## 2017-04-25 DIAGNOSIS — N401 Enlarged prostate with lower urinary tract symptoms: Secondary | ICD-10-CM | POA: Diagnosis not present

## 2017-04-27 ENCOUNTER — Other Ambulatory Visit: Payer: Self-pay

## 2017-04-27 DIAGNOSIS — R7309 Other abnormal glucose: Secondary | ICD-10-CM

## 2017-04-27 NOTE — Telephone Encounter (Signed)
Pt has been scheduled for a lab repeat of BMP/A1C on 04/20/2017. Orders have been placed.

## 2017-04-29 ENCOUNTER — Other Ambulatory Visit: Payer: Self-pay | Admitting: Adult Health

## 2017-04-29 ENCOUNTER — Encounter: Payer: Self-pay | Admitting: Family Medicine

## 2017-04-29 ENCOUNTER — Other Ambulatory Visit (INDEPENDENT_AMBULATORY_CARE_PROVIDER_SITE_OTHER): Payer: Medicare Other

## 2017-04-29 DIAGNOSIS — R7309 Other abnormal glucose: Secondary | ICD-10-CM

## 2017-04-29 LAB — HEMOGLOBIN A1C: Hgb A1c MFr Bld: 6.7 % — ABNORMAL HIGH (ref 4.6–6.5)

## 2017-04-29 LAB — BASIC METABOLIC PANEL
BUN: 17 mg/dL (ref 6–23)
CO2: 30 mEq/L (ref 19–32)
CREATININE: 1 mg/dL (ref 0.40–1.50)
Calcium: 9.6 mg/dL (ref 8.4–10.5)
Chloride: 101 mEq/L (ref 96–112)
GFR: 76.6 mL/min (ref >60.00)
GLUCOSE: 118 mg/dL — AB (ref 70–99)
POTASSIUM: 4 meq/L (ref 3.5–5.1)
Sodium: 140 mEq/L (ref 135–145)

## 2017-05-03 DIAGNOSIS — Z23 Encounter for immunization: Secondary | ICD-10-CM | POA: Diagnosis not present

## 2017-05-04 ENCOUNTER — Telehealth: Payer: Self-pay

## 2017-05-04 DIAGNOSIS — N3941 Urge incontinence: Secondary | ICD-10-CM | POA: Diagnosis not present

## 2017-05-04 DIAGNOSIS — R350 Frequency of micturition: Secondary | ICD-10-CM | POA: Diagnosis not present

## 2017-05-04 NOTE — Telephone Encounter (Signed)
Pt called for his A1C results, pt voiced understanding.

## 2017-05-04 NOTE — Telephone Encounter (Signed)
Todd pt

## 2017-06-13 ENCOUNTER — Other Ambulatory Visit: Payer: Self-pay | Admitting: Urology

## 2017-06-17 DIAGNOSIS — R972 Elevated prostate specific antigen [PSA]: Secondary | ICD-10-CM | POA: Diagnosis not present

## 2017-06-17 DIAGNOSIS — R350 Frequency of micturition: Secondary | ICD-10-CM | POA: Diagnosis not present

## 2017-06-17 DIAGNOSIS — N401 Enlarged prostate with lower urinary tract symptoms: Secondary | ICD-10-CM | POA: Diagnosis not present

## 2017-06-21 ENCOUNTER — Encounter (HOSPITAL_BASED_OUTPATIENT_CLINIC_OR_DEPARTMENT_OTHER): Payer: Self-pay | Admitting: *Deleted

## 2017-06-21 ENCOUNTER — Other Ambulatory Visit: Payer: Self-pay

## 2017-06-21 NOTE — Progress Notes (Addendum)
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

## 2017-06-23 ENCOUNTER — Encounter (HOSPITAL_BASED_OUTPATIENT_CLINIC_OR_DEPARTMENT_OTHER): Payer: Self-pay

## 2017-06-23 ENCOUNTER — Ambulatory Visit (HOSPITAL_BASED_OUTPATIENT_CLINIC_OR_DEPARTMENT_OTHER): Payer: Medicare Other | Admitting: Anesthesiology

## 2017-06-23 ENCOUNTER — Ambulatory Visit (HOSPITAL_BASED_OUTPATIENT_CLINIC_OR_DEPARTMENT_OTHER)
Admission: RE | Admit: 2017-06-23 | Discharge: 2017-06-23 | Disposition: A | Discharge status: Home / Self Care | Payer: Medicare Other | Source: Ambulatory Visit | Attending: Urology | Admitting: Urology

## 2017-06-23 ENCOUNTER — Encounter (HOSPITAL_BASED_OUTPATIENT_CLINIC_OR_DEPARTMENT_OTHER)
Admission: RE | Disposition: A | Discharge status: Home / Self Care | Payer: Self-pay | Source: Ambulatory Visit | Attending: Urology

## 2017-06-23 DIAGNOSIS — Z87891 Personal history of nicotine dependence: Secondary | ICD-10-CM | POA: Diagnosis not present

## 2017-06-23 DIAGNOSIS — N400 Enlarged prostate without lower urinary tract symptoms: Secondary | ICD-10-CM | POA: Insufficient documentation

## 2017-06-23 DIAGNOSIS — Z87442 Personal history of urinary calculi: Secondary | ICD-10-CM | POA: Diagnosis not present

## 2017-06-23 DIAGNOSIS — E785 Hyperlipidemia, unspecified: Secondary | ICD-10-CM | POA: Diagnosis not present

## 2017-06-23 DIAGNOSIS — E039 Hypothyroidism, unspecified: Secondary | ICD-10-CM | POA: Insufficient documentation

## 2017-06-23 DIAGNOSIS — I10 Essential (primary) hypertension: Secondary | ICD-10-CM | POA: Diagnosis not present

## 2017-06-23 DIAGNOSIS — N138 Other obstructive and reflux uropathy: Secondary | ICD-10-CM | POA: Insufficient documentation

## 2017-06-23 DIAGNOSIS — K219 Gastro-esophageal reflux disease without esophagitis: Secondary | ICD-10-CM | POA: Insufficient documentation

## 2017-06-23 DIAGNOSIS — M109 Gout, unspecified: Secondary | ICD-10-CM | POA: Insufficient documentation

## 2017-06-23 DIAGNOSIS — G4730 Sleep apnea, unspecified: Secondary | ICD-10-CM | POA: Insufficient documentation

## 2017-06-23 DIAGNOSIS — Z79899 Other long term (current) drug therapy: Secondary | ICD-10-CM | POA: Insufficient documentation

## 2017-06-23 DIAGNOSIS — N200 Calculus of kidney: Secondary | ICD-10-CM | POA: Diagnosis not present

## 2017-06-23 DIAGNOSIS — Z951 Presence of aortocoronary bypass graft: Secondary | ICD-10-CM | POA: Insufficient documentation

## 2017-06-23 DIAGNOSIS — N401 Enlarged prostate with lower urinary tract symptoms: Secondary | ICD-10-CM | POA: Diagnosis not present

## 2017-06-23 DIAGNOSIS — Z7984 Long term (current) use of oral hypoglycemic drugs: Secondary | ICD-10-CM | POA: Insufficient documentation

## 2017-06-23 DIAGNOSIS — I2510 Atherosclerotic heart disease of native coronary artery without angina pectoris: Secondary | ICD-10-CM | POA: Insufficient documentation

## 2017-06-23 DIAGNOSIS — E119 Type 2 diabetes mellitus without complications: Secondary | ICD-10-CM | POA: Insufficient documentation

## 2017-06-23 HISTORY — PX: CYSTOSCOPY WITH INSERTION OF UROLIFT: SHX6678

## 2017-06-23 HISTORY — DX: Respiratory tuberculosis unspecified: A15.9

## 2017-06-23 HISTORY — DX: Personal history of urinary calculi: Z87.442

## 2017-06-23 HISTORY — DX: Legal blindness, as defined in USA: H54.8

## 2017-06-23 LAB — GLUCOSE, CAPILLARY
Glucose-Capillary: 102 mg/dL — ABNORMAL HIGH (ref 65–99)
Glucose-Capillary: 108 mg/dL — ABNORMAL HIGH (ref 65–99)

## 2017-06-23 LAB — POCT I-STAT, CHEM 8
BUN: 18 mg/dL (ref 6–20)
CALCIUM ION: 1.19 mmol/L (ref 1.15–1.40)
CHLORIDE: 103 mmol/L (ref 101–111)
Creatinine, Ser: 0.9 mg/dL (ref 0.61–1.24)
Glucose, Bld: 94 mg/dL (ref 65–99)
HCT: 42 % (ref 39.0–52.0)
HEMOGLOBIN: 14.3 g/dL (ref 13.0–17.0)
POTASSIUM: 3.8 mmol/L (ref 3.5–5.1)
SODIUM: 139 mmol/L (ref 135–145)
TCO2: 27 mmol/L (ref 22–32)

## 2017-06-23 SURGERY — CYSTOSCOPY WITH INSERTION OF UROLIFT
Anesthesia: General

## 2017-06-23 MED ORDER — FENTANYL CITRATE (PF) 100 MCG/2ML IJ SOLN
INTRAMUSCULAR | Status: AC
  Filled 2017-06-23: qty 2

## 2017-06-23 MED ORDER — KETOROLAC TROMETHAMINE 30 MG/ML IJ SOLN
INTRAMUSCULAR | Status: DC | PRN
  Administered 2017-06-23: 30 mg via INTRAVENOUS

## 2017-06-23 MED ORDER — PROPOFOL 10 MG/ML IV BOLUS
INTRAVENOUS | Status: DC | PRN
Start: 2017-06-23 — End: 2017-06-23
  Administered 2017-06-23: 150 mg via INTRAVENOUS

## 2017-06-23 MED ORDER — GLYCOPYRROLATE 0.2 MG/ML IV SOSY
PREFILLED_SYRINGE | INTRAVENOUS | Status: AC
  Filled 2017-06-23: qty 5

## 2017-06-23 MED ORDER — GLYCOPYRROLATE 0.2 MG/ML IJ SOLN
INTRAMUSCULAR | Status: DC | PRN
  Administered 2017-06-23: 0.2 mg via INTRAVENOUS

## 2017-06-23 MED ORDER — CEFAZOLIN SODIUM-DEXTROSE 2-4 GM/100ML-% IV SOLN
INTRAVENOUS | Status: AC
  Filled 2017-06-23: qty 100

## 2017-06-23 MED ORDER — MIDAZOLAM HCL 2 MG/2ML IJ SOLN
INTRAMUSCULAR | Status: DC | PRN
Start: 2017-06-23 — End: 2017-06-23
  Administered 2017-06-23: 2 mg via INTRAVENOUS

## 2017-06-23 MED ORDER — LIDOCAINE 2% (20 MG/ML) 5 ML SYRINGE
INTRAMUSCULAR | Status: DC | PRN
  Administered 2017-06-23: 80 mg via INTRAVENOUS

## 2017-06-23 MED ORDER — MIDAZOLAM HCL 2 MG/2ML IJ SOLN
INTRAMUSCULAR | Status: AC
  Filled 2017-06-23: qty 2

## 2017-06-23 MED ORDER — ONDANSETRON HCL 4 MG/2ML IJ SOLN
INTRAMUSCULAR | Status: DC | PRN
  Administered 2017-06-23: 4 mg via INTRAVENOUS

## 2017-06-23 MED ORDER — CEFAZOLIN SODIUM-DEXTROSE 2-4 GM/100ML-% IV SOLN
2.0000 g | INTRAVENOUS | Status: AC
  Administered 2017-06-23: 2 g via INTRAVENOUS
  Filled 2017-06-23: qty 100

## 2017-06-23 MED ORDER — DEXAMETHASONE SODIUM PHOSPHATE 10 MG/ML IJ SOLN
INTRAMUSCULAR | Status: AC
  Filled 2017-06-23: qty 1

## 2017-06-23 MED ORDER — FENTANYL CITRATE (PF) 100 MCG/2ML IJ SOLN
INTRAMUSCULAR | Status: DC | PRN
  Administered 2017-06-23: 50 ug via INTRAVENOUS

## 2017-06-23 MED ORDER — PROPOFOL 10 MG/ML IV BOLUS
INTRAVENOUS | Status: AC
  Filled 2017-06-23: qty 40

## 2017-06-23 MED ORDER — SODIUM CHLORIDE 0.9 % IV SOLN
INTRAVENOUS | Status: DC
  Administered 2017-06-23: 06:00:00 via INTRAVENOUS
  Filled 2017-06-23: qty 1000

## 2017-06-23 MED ORDER — DEXAMETHASONE SODIUM PHOSPHATE 10 MG/ML IJ SOLN
INTRAMUSCULAR | Status: DC | PRN
  Administered 2017-06-23: 10 mg via INTRAVENOUS

## 2017-06-23 SURGICAL SUPPLY — 15 items
BAG DRAIN URO-CYSTO SKYTR STRL (DRAIN) ×3 IMPLANT
BAG URINE DRAINAGE (UROLOGICAL SUPPLIES) ×3 IMPLANT
BAG URINE LEG 19OZ MD ST LTX (BAG) ×3 IMPLANT
BAG URINE LEG 500ML (DRAIN) ×3 IMPLANT
CATH FOLEY 2WAY SLVR  5CC 18FR (CATHETERS) ×2
CATH FOLEY 2WAY SLVR 5CC 18FR (CATHETERS) ×1 IMPLANT
CLOTH BEACON ORANGE TIMEOUT ST (SAFETY) ×3 IMPLANT
GLOVE BIO SURGEON STRL SZ8 (GLOVE) ×3 IMPLANT
GOWN STRL REUS W/TWL XL LVL3 (GOWN DISPOSABLE) ×3 IMPLANT
MANIFOLD NEPTUNE II (INSTRUMENTS) ×3 IMPLANT
PACK CYSTO (CUSTOM PROCEDURE TRAY) ×3 IMPLANT
SYSTEM UROLIFT (Male Continence) ×21 IMPLANT
TUBE CONNECTING 12'X1/4 (SUCTIONS) ×1
TUBE CONNECTING 12X1/4 (SUCTIONS) ×2 IMPLANT
WATER STERILE IRR 3000ML UROMA (IV SOLUTION) ×6 IMPLANT

## 2017-06-23 NOTE — Anesthesia Preprocedure Evaluation (Addendum)
Anesthesia Evaluation  Patient identified by MRN, date of birth, ID band Patient awake    Reviewed: Allergy & Precautions, NPO status , Patient's Chart, lab work & pertinent test results  Airway Mallampati: II  TM Distance: >3 FB Neck ROM: Full    Dental  (+) Teeth Intact, Dental Advisory Given   Pulmonary sleep apnea (s/p uvulectomy) , former smoker,    Pulmonary exam normal breath sounds clear to auscultation       Cardiovascular hypertension, Pt. on medications + CAD and + CABG  Normal cardiovascular exam Rhythm:Regular Rate:Normal  Type 1 Mobitz AV Block on EKG  Echo 7/15: Study Conclusions  - Left ventricle: The cavity size was normal. Wall thickness wasnormal. Systolic function was normal. The estimated ejectionfraction was in the range of 55% to 60%. Wall motion was normal;there were no regional wall motion abnormalities. - Aortic valve: There was trivial regurgitation. - Mitral valve: There was mild regurgitation. - Left atrium: The atrium was moderately dilated. - Right ventricle: The cavity size was mildly dilated. Wall thickness was normal. - Right atrium: The atrium was moderately dilated.   Neuro/Psych Right eye blindness     GI/Hepatic Neg liver ROS, GERD  Medicated,  Endo/Other  diabetes, Type 2, Oral Hypoglycemic Agents, Insulin DependentHypothyroidism Obesity   Renal/GU negative Renal ROS   BPH    Musculoskeletal  (+) Arthritis ,   Abdominal   Peds  Hematology negative hematology ROS (+)   Anesthesia Other Findings Day of surgery medications reviewed with the patient.  Reproductive/Obstetrics                            Anesthesia Physical Anesthesia Plan  ASA: III  Anesthesia Plan: General   Post-op Pain Management:    Induction: Intravenous  PONV Risk Score and Plan: 3 and Ondansetron, Dexamethasone and Treatment may vary due to age or medical  condition  Airway Management Planned: LMA  Additional Equipment:   Intra-op Plan:   Post-operative Plan: Extubation in OR  Informed Consent: I have reviewed the patients History and Physical, chart, labs and discussed the procedure including the risks, benefits and alternatives for the proposed anesthesia with the patient or authorized representative who has indicated his/her understanding and acceptance.   Dental advisory given  Plan Discussed with: CRNA  Anesthesia Plan Comments: (Risks/benefits of general anesthesia discussed with patient including risk of damage to teeth, lips, gum, and tongue, nausea/vomiting, allergic reactions to medications, and the possibility of heart attack, stroke and death.  All patient questions answered.  Patient wishes to proceed.)        Anesthesia Quick Evaluation

## 2017-06-23 NOTE — Op Note (Signed)
Preoperative diagnosis: BPH with obstructive symptomatology.  Postoperative diagnosis: Same  Principal procedure: Urolift procedure, with the placement of 7 implants.  Surgeon: Diona Fanti  Anesthesia: Gen. with LMA  Complications: None  Drains: 18 French Foley catheter, to leg bag.  Estimated blood loss: Less than 25 mL  Indications: 81 -year-old male with obstructive symptomatology secondary to BPH.  The patient's symptoms have progressed, and he has requested further management.  Management options including TURP with resection/ablation of the prostate as well as Urolift were discussed.  The patient has chosen to have a Urolift procedure.  He has been instructed to the procedure as well as risks and complications which include but are not limited to infection, bleeding, and inadequate treatment with the Urolift procedure alone, anesthetic complications, among others.  He understands these and desires to proceed.  Findings: Using the 17 French cystoscope, urethra and bladder were inspected.  There were no urethral lesions.  Prostatic urethra was obstructed secondary to bilobar hypertrophy.  The bladder was inspected circumferentially.  This revealed normal findings.  Description of procedure: The patient was properly identified in the holding area.  He received preoperative IV antibiotics.  He was taken to the operating room where general anesthetic was administered with the LMA.  He is placed in the dorsolithotomy position.  Genitalia and perineum were prepped and draped.  Proper timeout was performed.  A 43F cystoscope was inserted into the bladder. The cystoscopy bridge was replaced with a UroLift delivery device.The first treatment site was the patient's right side approximately 1.5cm distal to the bladder neck. The distal tip of the delivery device was then angled laterally approximately 20 degrees at this position to compress the lateral lobe. The trigger was pulled, thereby deploying a  needle containing the implant through the prostate. The needle was then retracted, allowing one end of the implant to be delivered to the capsular surface of the prostate. The implant was then tensioned to assure capsular seating and removal of slack monofilament. The device was then angled back toward midline and slowly advanced proximally until cystoscopic verification of the monofilament being centered in the delivery bay. The urethral end piece was then affixed to the monofilament thereby tailoring the size of the implant. Excess filament was then severed. The delivery device was then re-advanced into the bladder. The delivery device was then replaced with cystoscope and bridge and the implant location and opening effect was confirmed cystoscopically. The same procedure was then repeated on the left side at the bladder neck and 5 additional implants were delivered-- just proximal to the verumontanum, again one on right and one on left side of the prostate, two in the mid prostate bilaterally and then the 7th implant was stacked on the right side in the mid/basal prostate following the same technique.   A final cystoscopy was conducted first to inspect the location and state of each implant and second, to confirm the presence of a continuous anterior channel was present through the prostatic urethra with irrigation flow turned off. 7 Implants were delivered in total.  Following this, the scope was removed and a 16 French Foley catheter was placed and hooked to dependent drainage.  He was then awakened and taken to the PACU in stable condition.  He tolerated the procedure well.

## 2017-06-23 NOTE — Interval H&P Note (Signed)
History and Physical Interval Note:  06/23/2017 7:26 AM  Jonathan [name]  has presented today for surgery, with the diagnosis of BENIGN PROSTATIC HYPERPLASIA  The various methods of treatment have been discussed with the patient and family. After consideration of risks, benefits and other options for treatment, the patient has consented to  Procedure(s): CYSTOSCOPY WITH INSERTION OF UROLIFT (N/A) as a surgical intervention .  The patient's history has been reviewed, patient examined, no change in status, stable for surgery.  I have reviewed the patient's chart and labs.  Questions were answered to the patient's satisfaction.     [name]

## 2017-06-23 NOTE — Transfer of Care (Signed)
Immediate Anesthesia Transfer of Care Note  Patient: Jonathan [name]  Procedure(s) Performed: CYSTOSCOPY WITH INSERTION OF UROLIFT (N/A )  Patient Location: PACU  Anesthesia Type:General  Level of Consciousness: awake, alert , oriented and patient cooperative  Airway & Oxygen Therapy: Patient Spontanous Breathing and Patient connected to nasal cannula oxygen  Post-op Assessment: Report given to RN and Post -op Vital signs reviewed and stable  Post vital signs: Reviewed and stable  Last Vitals:  Vitals:   06/23/17 0609 06/23/17 0809  BP: 128/90 (!) 144/85  Pulse: 66 82  Resp: 18 18  Temp: 36.6 C (!) 36.3 C  SpO2: 100% 97%    Last Pain:  Vitals:   06/23/17 0609  TempSrc: Oral      Patients Stated Pain Goal: 5 (81/84/03 7543)  Complications: No apparent anesthesia complications

## 2017-06-23 NOTE — Anesthesia Procedure Notes (Signed)
Procedure Name: LMA Insertion Date/Time: 06/23/2017 7:36 AM Performed by: Wanita Chamberlain, CRNA Pre-anesthesia Checklist: Patient identified, Timeout performed, Emergency Drugs available, Suction available and Patient being monitored Patient Re-evaluated:Patient Re-evaluated prior to induction Oxygen Delivery Method: Circle system utilized Preoxygenation: Pre-oxygenation with 100% oxygen Induction Type: IV induction Ventilation: Mask ventilation without difficulty LMA: LMA inserted LMA Size: 5.0 Number of attempts: 1 Placement Confirmation: CO2 detector,  positive ETCO2 and breath sounds checked- equal and bilateral Tube secured with: Tape Dental Injury: Teeth and Oropharynx as per pre-operative assessment

## 2017-06-23 NOTE — Anesthesia Postprocedure Evaluation (Signed)
Anesthesia Post Note  Patient: Jonathan [name]  Procedure(s) Performed: CYSTOSCOPY WITH INSERTION OF UROLIFT (N/A )     Patient location during evaluation: PACU Anesthesia Type: General Level of consciousness: awake and alert Pain management: pain level controlled Vital Signs Assessment: post-procedure vital signs reviewed and stable Respiratory status: spontaneous breathing, nonlabored ventilation and respiratory function stable Cardiovascular status: blood pressure returned to baseline and stable Postop Assessment: no apparent nausea or vomiting Anesthetic complications: no    Last Vitals:  Vitals:   06/23/17 0845 06/23/17 0900  BP: (!) 156/95 (!) 149/74  Pulse: 76 69  Resp: 15 (!) 22  Temp:    SpO2: 97% 98%    Last Pain:  Vitals:   06/23/17 0845  TempSrc:   PainSc: 2                  Catalina Gravel

## 2017-06-23 NOTE — Discharge Instructions (Signed)
1. You may see some blood in the urine and may have some burning with urination for 48-72 hours. You also may notice that you have to urinate more frequently or urgently after your procedure which is normal.  2. You should call should you develop an inability urinate, fever > 101, persistent nausea and vomiting that prevents you from eating or drinking to stay hydrated.  3. If you have a catheter, you will be taught how to take care of the catheter by the nursing staff prior to discharge from the hospital. Do this on Friday morning.  You may periodically feel a strong urge to void with the catheter in place.  This is a bladder spasm and most often can occur when having a bowel movement or moving around. It is typically self-limited and usually will stop after a few minutes.  You may use some Vaseline or Neosporin around the tip of the catheter to reduce friction at the tip of the penis. You may also see some blood in the urine.  A very small amount of blood can make the urine look quite red.  As long as the catheter is draining well, there usually is not a problem.  However, if the catheter is not draining well and is bloody, you should call the office [phone]) to notify us.   Post Anesthesia Home Care Instructions  Activity: Get plenty of rest for the remainder of the day. A responsible individual must stay with you for 24 hours following the procedure.  For the next 24 hours, DO NOT: -Drive a car -Paediatric nurse -Drink alcoholic beverages -Take any medication unless instructed by your physician -Make any legal decisions or sign important papers.  Meals: Start with liquid foods such as gelatin or soup. Progress to regular foods as tolerated. Avoid greasy, spicy, heavy foods. If nausea and/or vomiting occur, drink only clear liquids until the nausea and/or vomiting subsides. Call your physician if vomiting continues.  Special Instructions/Symptoms: Your throat may feel dry or sore from  the anesthesia or the breathing tube placed in your throat during surgery. If this causes discomfort, gargle with warm salt water. The discomfort should disappear within 24 hours.  If you had a scopolamine patch placed behind your ear for the management of post- operative nausea and/or vomiting:  1. The medication in the patch is effective for 72 hours, after which it should be removed.  Wrap patch in a tissue and discard in the trash. Wash hands thoroughly with soap and water. 2. You may remove the patch earlier than 72 hours if you experience unpleasant side effects which may include dry mouth, dizziness or visual disturbances. 3. Avoid touching the patch. Wash your hands with soap and water after contact with the patch.    Indwelling Urinary Catheter Care, Adult Take good care of your catheter to keep it working and to prevent problems. How to wear your catheter Attach your catheter to your leg with tape (adhesive tape) or a leg strap. Make sure it is not too tight. If you use tape, remove any bits of tape that are already on the catheter. How to wear a drainage bag You should have:  A large overnight bag.  A small leg bag.  Overnight Bag You may wear the overnight bag at any time. Always keep the bag below the level of your bladder but off the floor. When you sleep, put a clean plastic bag in a wastebasket. Then hang the bag inside the wastebasket. Leg Bag  Never wear the leg bag at night. Always wear the leg bag below your knee. Keep the leg bag secure with a leg strap or tape. How to care for your skin  Clean the skin around the catheter at least once every day.  Shower every day. Do not take baths.  Put creams, lotions, or ointments on your genital area only as told by your doctor.  Do not use powders, sprays, or lotions on your genital area. How to clean your catheter and your skin 1. Wash your hands with soap and water. 2. Wet a washcloth in warm water and gentle (mild)  soap. 3. Use the washcloth to clean the skin where the catheter enters your body. Clean downward and wipe away from the catheter in small circles. Do not wipe toward the catheter. 4. Pat the area dry with a clean towel. Make sure to clean off all soap. How to care for your drainage bags Empty your drainage bag when it is ?- full or at least 2-3 times a day. Replace your drainage bag once a month or sooner if it starts to smell bad or look dirty. Do not clean your drainage bag unless told by your doctor. Emptying a drainage bag  Supplies Needed  Rubbing alcohol.  Gauze pad or cotton ball.  Tape or a leg strap.  Steps 1. Wash your hands with soap and water. 2. Separate (detach) the bag from your leg. 3. Hold the bag over the toilet or a clean container. Keep the bag below your hips and bladder. This stops pee (urine) from going back into the tube. 4. Open the pour spout at the bottom of the bag. 5. Empty the pee into the toilet or container. Do not let the pour spout touch any surface. 6. Put rubbing alcohol on a gauze pad or cotton ball. 7. Use the gauze pad or cotton ball to clean the pour spout. 8. Close the pour spout. 9. Attach the bag to your leg with tape or a leg strap. 10. Wash your hands.  Changing a drainage bag Supplies Needed  Alcohol wipes.  A clean drainage bag.  Adhesive tape or a leg strap.  Steps 1. Wash your hands with soap and water. 2. Separate the dirty bag from your leg. 3. Pinch the rubber catheter with your fingers so that pee does not spill out. 4. Separate the catheter tube from the drainage tube where these tubes connect (at the connection valve). Do not let the tubes touch any surface. 5. Clean the end of the catheter tube with an alcohol wipe. Use a different alcohol wipe to clean the end of the drainage tube. 6. Connect the catheter tube to the drainage tube of the clean bag. 7. Attach the new bag to the leg with adhesive tape or a leg  strap. 8. Wash your hands.  How to prevent infection and other problems  Never pull on your catheter or try to remove it. Pulling can damage tissue in your body.  Always wash your hands before and after touching your catheter.  If a leg strap gets wet, replace it with a dry one.  Drink enough fluids to keep your pee clear or pale yellow, or as told by your doctor.  Do not let the drainage bag or tubing touch the floor.  Wear cotton underwear.  If you are male, wipe from front to back after you poop (have a bowel movement).  Check on the catheter often to make sure it  works and the tubing is not twisted. Get help if:  Your pee is cloudy.  Your pee smells unusually bad.  Your pee is not draining into the bag.  Your tube gets clogged.  Your catheter starts to leak.  Your bladder feels full. Get help right away if:  You have redness, swelling, or pain where the catheter enters your body.  You have fluid, pus, or a bad smell coming from the area where the catheter enters your body.  The area where the catheter enters your body feels warm.  You have a fever.  You have pain in your: ? Stomach (abdomen). ? Legs. ? Lower back. ? Bladder.  You see blood fill the catheter.  Your pee is pink or red.  You feel sick to your stomach (nauseous).  You throw up (vomit).  You have chills.  Your catheter gets pulled out. This information is not intended to replace advice given to you by your health care provider. Make sure you discuss any questions you have with your health care provider. Document Released: 11/20/2012 Document Revised: 06/23/2016 Document Reviewed: 01/08/2014 Elsevier Interactive Patient Education  [employer].

## 2017-06-23 NOTE — H&P (Signed)
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 He has significant LUTS and an obstructive prostate. HE has had UDS as well as cystoscopy revealing an obstructive prostate with high bladder pressures. He does have bladder instability. We have discussed mgmt of his issues, and he has cosen the Urolift.  Past Medical History:  Diagnosis Date  . Benign prostatic hypertrophy   . CAD (coronary artery disease)    CABG 2005 by Dr Cyndia Bent  . DJD (degenerative joint disease)    bilateral knee, gout  . GERD (gastroesophageal reflux disease)    HX OF  . Glomerulonephritis acute    at age 48, due to beta strep, resolved, renal calculi- passed spontaneously  . Gout   . Hepatitis A 1950  . Hepatitis B 1980's  . History of kidney stones    3-4 TIMES SINCE 1970 PASSED ON OWN  . HOH (hard of hearing)    wears hearing aids  . Hyperlipidemia   . Hypertension   . Hypothyroidism   . Incontinence of urine   . Legally blind in right eye, as defined in [place]   . Personal history of other infectious and parasitic disease   . Sleep apnea 1990's   "before uvulectomy"  . Squamous cell carcinoma    "lots on my arms & face" (09/21/2013)  . Sun-damaged skin   . 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 Type II diabetes mellitus ([place])     Past Surgical History:  Procedure Laterality Date  . CARDIAC CATHETERIZATION    . CATARACT EXTRACTION W/ INTRAOCULAR LENS  IMPLANT, BILATERAL Bilateral   . CORONARY ARTERY BYPASS GRAFT  01/23/04   Dr Cyndia Bent  . EYE SURGERY Right    detached retina ,blind in right eye  . INGUINAL HERNIA REPAIR Bilateral 1980's - 1990's  . MASTOIDECTOMY Right 1943  . QUADRICEPS REPAIR Right 2009  . RETINAL DETACHMENT SURGERY Right X 2  . SEPSIS  1943  . TONSILLECTOMY  ~ 1943  . TOTAL KNEE ARTHROPLASTY Left 09/18/2013   Procedure: TOTAL KNEE  ARTHROPLASTY;  Surgeon: Hessie Dibble, MD;  Location: [facility];  Service: Orthopedics;  Laterality: Left;  . TOTAL KNEE ARTHROPLASTY Right 08/22/2014   Procedure: RIGHT TOTAL KNEE ARTHROPLASTY;  Surgeon: Hessie Dibble, MD;  Location: [place];  Service: Orthopedics;  Laterality: Right;  . UMBILICAL HERNIA REPAIR    . UVULOPALATOPLASTY  1990's   ENT MD DR Rock Nephew    Home Medications:    Allergies:  Allergies  Allergen Reactions  . Bee Venom Anaphylaxis    Family History  Problem Relation Age of Onset  . Diabetes Other        1st degree relative  . Hypertension Unknown        Family History    Social History:  reports that he quit smoking about 51 years ago. His smoking use included cigarettes. He has a 10.00 pack-year smoking history. he has never used smokeless tobacco. He reports that he drinks alcohol. He reports that he does not use drugs.  ROS: A complete review of systems was performed.  All systems are negative except for pertinent findings as noted.  Physical Exam:  Vital signs in last 24 hours:   Constitutional:  Alert and oriented, No acute distress Cardiovascular: Regular rate and rhythm, No JVD Respiratory: Normal respiratory effort, Lungs clear bilaterally GI:  Abdomen is soft, nontender, nondistended, no abdominal masses Genitourinary: No CVAT. Normal male phallus, testes are descended bilaterally and non-tender and without masses, scrotum is normal in appearance without lesions or masses, perineum is normal on inspection. Rectal: Normal sphincter tone, no rectal masses, prostate is non tender and without nodularity. Prostate size is estimated to be 40 cc Lymphatic: No lymphadenopathy Neurologic: Grossly intact, no focal deficits Psychiatric: Normal mood and affect  Laboratory Data:  No results for input(s): WBC, HGB, HCT, PLT in the last 72 hours.  No results for input(s): NA, K, CL, GLUCOSE, BUN, CALCIUM, CREATININE in the last 72 hours.  Invalid input(s):  CO3   Results for orders placed or performed during the hospital encounter of 06/23/17 (from the past 24 hour(s))  Glucose, capillary     Status: Abnormal   Collection Time: 06/23/17  5:56 AM  Result Value Ref Range   Glucose-Capillary 102 (H) 65 - 99 mg/dL   Comment 1 Notify RN    No results found for this or any previous visit (from the past 240 hour(s)).  Renal Function: No results for input(s): CREATININE in the last 168 hours. CrCl cannot be calculated (Patient's most recent lab result is older than the maximum 21 days allowed.).  Radiologic Imaging: No results found.  Impression/Assessment:  BPH with significant LUTS  Plan:  Urolift procedure

## 2017-06-24 ENCOUNTER — Encounter (HOSPITAL_BASED_OUTPATIENT_CLINIC_OR_DEPARTMENT_OTHER): Payer: Self-pay | Admitting: Urology

## 2017-07-07 DIAGNOSIS — Z85828 Personal history of other malignant neoplasm of skin: Secondary | ICD-10-CM | POA: Diagnosis not present

## 2017-07-07 DIAGNOSIS — L918 Other hypertrophic disorders of the skin: Secondary | ICD-10-CM | POA: Diagnosis not present

## 2017-07-07 DIAGNOSIS — D485 Neoplasm of uncertain behavior of skin: Secondary | ICD-10-CM | POA: Diagnosis not present

## 2017-07-07 DIAGNOSIS — L821 Other seborrheic keratosis: Secondary | ICD-10-CM | POA: Diagnosis not present

## 2017-07-14 DIAGNOSIS — D0422 Carcinoma in situ of skin of left ear and external auricular canal: Secondary | ICD-10-CM | POA: Diagnosis not present

## 2017-07-14 DIAGNOSIS — Z85828 Personal history of other malignant neoplasm of skin: Secondary | ICD-10-CM | POA: Diagnosis not present

## 2017-07-14 DIAGNOSIS — C44622 Squamous cell carcinoma of skin of right upper limb, including shoulder: Secondary | ICD-10-CM | POA: Diagnosis not present

## 2017-07-14 DIAGNOSIS — L820 Inflamed seborrheic keratosis: Secondary | ICD-10-CM | POA: Diagnosis not present

## 2017-07-14 DIAGNOSIS — C44612 Basal cell carcinoma of skin of right upper limb, including shoulder: Secondary | ICD-10-CM | POA: Diagnosis not present

## 2017-07-14 DIAGNOSIS — R3912 Poor urinary stream: Secondary | ICD-10-CM | POA: Diagnosis not present

## 2017-07-14 DIAGNOSIS — D485 Neoplasm of uncertain behavior of skin: Secondary | ICD-10-CM | POA: Diagnosis not present

## 2017-07-14 DIAGNOSIS — R3915 Urgency of urination: Secondary | ICD-10-CM | POA: Diagnosis not present

## 2017-07-14 DIAGNOSIS — C44519 Basal cell carcinoma of skin of other part of trunk: Secondary | ICD-10-CM | POA: Diagnosis not present

## 2017-07-14 DIAGNOSIS — N401 Enlarged prostate with lower urinary tract symptoms: Secondary | ICD-10-CM | POA: Diagnosis not present

## 2017-07-14 DIAGNOSIS — D0372 Melanoma in situ of left lower limb, including hip: Secondary | ICD-10-CM | POA: Diagnosis not present

## 2017-08-05 DIAGNOSIS — L905 Scar conditions and fibrosis of skin: Secondary | ICD-10-CM | POA: Diagnosis not present

## 2017-08-05 DIAGNOSIS — Z85828 Personal history of other malignant neoplasm of skin: Secondary | ICD-10-CM | POA: Diagnosis not present

## 2017-08-05 DIAGNOSIS — D0372 Melanoma in situ of left lower limb, including hip: Secondary | ICD-10-CM | POA: Diagnosis not present

## 2017-08-09 ENCOUNTER — Other Ambulatory Visit: Payer: Self-pay | Admitting: Family Medicine

## 2017-10-09 ENCOUNTER — Other Ambulatory Visit: Payer: Self-pay | Admitting: Family Medicine

## 2017-10-12 DIAGNOSIS — R351 Nocturia: Secondary | ICD-10-CM | POA: Diagnosis not present

## 2017-10-12 DIAGNOSIS — N3281 Overactive bladder: Secondary | ICD-10-CM | POA: Diagnosis not present

## 2017-10-12 DIAGNOSIS — N401 Enlarged prostate with lower urinary tract symptoms: Secondary | ICD-10-CM | POA: Diagnosis not present

## 2017-10-31 DIAGNOSIS — M19011 Primary osteoarthritis, right shoulder: Secondary | ICD-10-CM | POA: Diagnosis not present

## 2017-10-31 DIAGNOSIS — M79675 Pain in left toe(s): Secondary | ICD-10-CM | POA: Diagnosis not present

## 2017-11-03 DIAGNOSIS — M25511 Pain in right shoulder: Secondary | ICD-10-CM | POA: Diagnosis not present

## 2017-11-03 DIAGNOSIS — M25611 Stiffness of right shoulder, not elsewhere classified: Secondary | ICD-10-CM | POA: Diagnosis not present

## 2017-11-03 DIAGNOSIS — M19011 Primary osteoarthritis, right shoulder: Secondary | ICD-10-CM | POA: Diagnosis not present

## 2017-11-08 DIAGNOSIS — M25511 Pain in right shoulder: Secondary | ICD-10-CM | POA: Diagnosis not present

## 2017-11-08 DIAGNOSIS — M19011 Primary osteoarthritis, right shoulder: Secondary | ICD-10-CM | POA: Diagnosis not present

## 2017-11-08 DIAGNOSIS — M25611 Stiffness of right shoulder, not elsewhere classified: Secondary | ICD-10-CM | POA: Diagnosis not present

## 2017-11-10 DIAGNOSIS — M25611 Stiffness of right shoulder, not elsewhere classified: Secondary | ICD-10-CM | POA: Diagnosis not present

## 2017-11-10 DIAGNOSIS — M19011 Primary osteoarthritis, right shoulder: Secondary | ICD-10-CM | POA: Diagnosis not present

## 2017-11-10 DIAGNOSIS — M25511 Pain in right shoulder: Secondary | ICD-10-CM | POA: Diagnosis not present

## 2017-11-15 DIAGNOSIS — M19011 Primary osteoarthritis, right shoulder: Secondary | ICD-10-CM | POA: Diagnosis not present

## 2017-11-15 DIAGNOSIS — M25511 Pain in right shoulder: Secondary | ICD-10-CM | POA: Diagnosis not present

## 2017-11-15 DIAGNOSIS — M25611 Stiffness of right shoulder, not elsewhere classified: Secondary | ICD-10-CM | POA: Diagnosis not present

## 2017-11-17 DIAGNOSIS — M25611 Stiffness of right shoulder, not elsewhere classified: Secondary | ICD-10-CM | POA: Diagnosis not present

## 2017-11-17 DIAGNOSIS — M25511 Pain in right shoulder: Secondary | ICD-10-CM | POA: Diagnosis not present

## 2017-11-17 DIAGNOSIS — M19011 Primary osteoarthritis, right shoulder: Secondary | ICD-10-CM | POA: Diagnosis not present

## 2017-11-22 DIAGNOSIS — M19011 Primary osteoarthritis, right shoulder: Secondary | ICD-10-CM | POA: Diagnosis not present

## 2017-11-22 DIAGNOSIS — M25511 Pain in right shoulder: Secondary | ICD-10-CM | POA: Diagnosis not present

## 2017-11-22 DIAGNOSIS — M25611 Stiffness of right shoulder, not elsewhere classified: Secondary | ICD-10-CM | POA: Diagnosis not present

## 2017-11-24 DIAGNOSIS — M19011 Primary osteoarthritis, right shoulder: Secondary | ICD-10-CM | POA: Diagnosis not present

## 2017-11-24 DIAGNOSIS — M25511 Pain in right shoulder: Secondary | ICD-10-CM | POA: Diagnosis not present

## 2017-11-24 DIAGNOSIS — M25611 Stiffness of right shoulder, not elsewhere classified: Secondary | ICD-10-CM | POA: Diagnosis not present

## 2017-11-28 DIAGNOSIS — M19011 Primary osteoarthritis, right shoulder: Secondary | ICD-10-CM | POA: Diagnosis not present

## 2017-11-29 DIAGNOSIS — M25611 Stiffness of right shoulder, not elsewhere classified: Secondary | ICD-10-CM | POA: Diagnosis not present

## 2017-11-29 DIAGNOSIS — M25511 Pain in right shoulder: Secondary | ICD-10-CM | POA: Diagnosis not present

## 2017-11-29 DIAGNOSIS — M19011 Primary osteoarthritis, right shoulder: Secondary | ICD-10-CM | POA: Diagnosis not present

## 2017-12-01 DIAGNOSIS — M25611 Stiffness of right shoulder, not elsewhere classified: Secondary | ICD-10-CM | POA: Diagnosis not present

## 2017-12-01 DIAGNOSIS — M25511 Pain in right shoulder: Secondary | ICD-10-CM | POA: Diagnosis not present

## 2017-12-01 DIAGNOSIS — M19011 Primary osteoarthritis, right shoulder: Secondary | ICD-10-CM | POA: Diagnosis not present

## 2017-12-04 ENCOUNTER — Other Ambulatory Visit: Payer: Self-pay | Admitting: Family Medicine

## 2017-12-07 DIAGNOSIS — M25511 Pain in right shoulder: Secondary | ICD-10-CM | POA: Diagnosis not present

## 2017-12-07 DIAGNOSIS — M19011 Primary osteoarthritis, right shoulder: Secondary | ICD-10-CM | POA: Diagnosis not present

## 2017-12-07 DIAGNOSIS — M25611 Stiffness of right shoulder, not elsewhere classified: Secondary | ICD-10-CM | POA: Diagnosis not present

## 2017-12-09 DIAGNOSIS — M19011 Primary osteoarthritis, right shoulder: Secondary | ICD-10-CM | POA: Diagnosis not present

## 2017-12-09 DIAGNOSIS — M25511 Pain in right shoulder: Secondary | ICD-10-CM | POA: Diagnosis not present

## 2017-12-09 DIAGNOSIS — M25611 Stiffness of right shoulder, not elsewhere classified: Secondary | ICD-10-CM | POA: Diagnosis not present

## 2017-12-13 ENCOUNTER — Other Ambulatory Visit: Payer: Self-pay | Admitting: Family Medicine

## 2017-12-13 DIAGNOSIS — M25611 Stiffness of right shoulder, not elsewhere classified: Secondary | ICD-10-CM | POA: Diagnosis not present

## 2017-12-13 DIAGNOSIS — M25511 Pain in right shoulder: Secondary | ICD-10-CM | POA: Diagnosis not present

## 2017-12-13 DIAGNOSIS — M19011 Primary osteoarthritis, right shoulder: Secondary | ICD-10-CM | POA: Diagnosis not present

## 2017-12-15 DIAGNOSIS — M25611 Stiffness of right shoulder, not elsewhere classified: Secondary | ICD-10-CM | POA: Diagnosis not present

## 2017-12-15 DIAGNOSIS — M25511 Pain in right shoulder: Secondary | ICD-10-CM | POA: Diagnosis not present

## 2017-12-15 DIAGNOSIS — M19011 Primary osteoarthritis, right shoulder: Secondary | ICD-10-CM | POA: Diagnosis not present

## 2017-12-19 DIAGNOSIS — H3321 Serous retinal detachment, right eye: Secondary | ICD-10-CM | POA: Diagnosis not present

## 2017-12-19 DIAGNOSIS — Z961 Presence of intraocular lens: Secondary | ICD-10-CM | POA: Diagnosis not present

## 2017-12-19 DIAGNOSIS — E119 Type 2 diabetes mellitus without complications: Secondary | ICD-10-CM | POA: Diagnosis not present

## 2017-12-19 DIAGNOSIS — Z794 Long term (current) use of insulin: Secondary | ICD-10-CM | POA: Diagnosis not present

## 2017-12-20 ENCOUNTER — Encounter: Payer: Self-pay | Admitting: Internal Medicine

## 2017-12-20 DIAGNOSIS — M25511 Pain in right shoulder: Secondary | ICD-10-CM | POA: Diagnosis not present

## 2017-12-20 DIAGNOSIS — M25611 Stiffness of right shoulder, not elsewhere classified: Secondary | ICD-10-CM | POA: Diagnosis not present

## 2017-12-20 DIAGNOSIS — M19011 Primary osteoarthritis, right shoulder: Secondary | ICD-10-CM | POA: Diagnosis not present

## 2017-12-22 DIAGNOSIS — M25611 Stiffness of right shoulder, not elsewhere classified: Secondary | ICD-10-CM | POA: Diagnosis not present

## 2017-12-22 DIAGNOSIS — M25511 Pain in right shoulder: Secondary | ICD-10-CM | POA: Diagnosis not present

## 2017-12-22 DIAGNOSIS — M19011 Primary osteoarthritis, right shoulder: Secondary | ICD-10-CM | POA: Diagnosis not present

## 2017-12-27 DIAGNOSIS — M25611 Stiffness of right shoulder, not elsewhere classified: Secondary | ICD-10-CM | POA: Diagnosis not present

## 2017-12-27 DIAGNOSIS — M25511 Pain in right shoulder: Secondary | ICD-10-CM | POA: Diagnosis not present

## 2017-12-27 DIAGNOSIS — M19011 Primary osteoarthritis, right shoulder: Secondary | ICD-10-CM | POA: Diagnosis not present

## 2017-12-29 DIAGNOSIS — M19011 Primary osteoarthritis, right shoulder: Secondary | ICD-10-CM | POA: Diagnosis not present

## 2017-12-29 DIAGNOSIS — M25611 Stiffness of right shoulder, not elsewhere classified: Secondary | ICD-10-CM | POA: Diagnosis not present

## 2017-12-29 DIAGNOSIS — M25511 Pain in right shoulder: Secondary | ICD-10-CM | POA: Diagnosis not present

## 2018-01-03 DIAGNOSIS — M25611 Stiffness of right shoulder, not elsewhere classified: Secondary | ICD-10-CM | POA: Diagnosis not present

## 2018-01-03 DIAGNOSIS — M19011 Primary osteoarthritis, right shoulder: Secondary | ICD-10-CM | POA: Diagnosis not present

## 2018-01-03 DIAGNOSIS — M25511 Pain in right shoulder: Secondary | ICD-10-CM | POA: Diagnosis not present

## 2018-01-05 DIAGNOSIS — M25611 Stiffness of right shoulder, not elsewhere classified: Secondary | ICD-10-CM | POA: Diagnosis not present

## 2018-01-05 DIAGNOSIS — M19011 Primary osteoarthritis, right shoulder: Secondary | ICD-10-CM | POA: Diagnosis not present

## 2018-01-05 DIAGNOSIS — M25511 Pain in right shoulder: Secondary | ICD-10-CM | POA: Diagnosis not present

## 2018-01-09 ENCOUNTER — Encounter: Payer: Self-pay | Admitting: Internal Medicine

## 2018-01-09 ENCOUNTER — Ambulatory Visit (INDEPENDENT_AMBULATORY_CARE_PROVIDER_SITE_OTHER): Payer: Medicare Other | Admitting: Internal Medicine

## 2018-01-09 VITALS — BP 122/64 | HR 64 | Ht 74.5 in | Wt 248.0 lb

## 2018-01-09 DIAGNOSIS — I10 Essential (primary) hypertension: Secondary | ICD-10-CM

## 2018-01-09 DIAGNOSIS — I441 Atrioventricular block, second degree: Secondary | ICD-10-CM | POA: Diagnosis not present

## 2018-01-09 DIAGNOSIS — E785 Hyperlipidemia, unspecified: Secondary | ICD-10-CM | POA: Diagnosis not present

## 2018-01-09 DIAGNOSIS — I25810 Atherosclerosis of coronary artery bypass graft(s) without angina pectoris: Secondary | ICD-10-CM | POA: Diagnosis not present

## 2018-01-09 NOTE — Progress Notes (Signed)
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 male who presents today for routine electrophysiology followup.  Since last being seen in our clinic, the patient reports doing very well.  Today, he denies symptoms of palpitations, chest pain, shortness of breath,  lower extremity edema, dizziness, presyncope, or syncope.  The patient is otherwise without complaint today.   Past Medical History:  Diagnosis Date  . Benign prostatic hypertrophy   . CAD (coronary artery disease)    CABG 2005 by Dr Cyndia Bent  . DJD (degenerative joint disease)    bilateral knee, gout  . GERD (gastroesophageal reflux disease)    HX OF  . Glomerulonephritis acute    at age 81, due to beta strep, resolved, renal calculi- passed spontaneously  . Gout   . Hepatitis A 1950  . Hepatitis B 1980's  . History of kidney stones    3-4 TIMES SINCE 1970 PASSED ON OWN  . HOH (hard of hearing)    wears hearing aids  . Hyperlipidemia   . Hypertension   . Hypothyroidism   . Incontinence of urine   . Legally blind in right eye, as defined in [place]   . Personal history of other infectious and parasitic disease   . Sleep apnea 1990's   "before uvulectomy"  . Squamous cell carcinoma    "lots on my arms & face" (09/21/2013)  . Sun-damaged skin   . 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 Type II diabetes mellitus ([place])    Past Surgical History:  Procedure Laterality Date  . CARDIAC CATHETERIZATION    . CATARACT EXTRACTION W/ INTRAOCULAR LENS  IMPLANT, BILATERAL Bilateral   . CORONARY ARTERY BYPASS GRAFT  01/23/04   Dr Cyndia Bent  . CYSTOSCOPY WITH INSERTION OF UROLIFT N/A 06/23/2017   Procedure: CYSTOSCOPY WITH INSERTION OF UROLIFT;  Surgeon: Franchot Gallo, MD;  Location: [facility];  Service: Urology;  Laterality: N/A;  . EYE SURGERY Right    detached retina ,blind in right eye  . INGUINAL HERNIA REPAIR Bilateral 1980's -  1990's  . MASTOIDECTOMY Right 1943  . QUADRICEPS REPAIR Right 2009  . RETINAL DETACHMENT SURGERY Right X 2  . SEPSIS  1943  . TONSILLECTOMY  ~ 1943  . TOTAL KNEE ARTHROPLASTY Left 09/18/2013   Procedure: TOTAL KNEE ARTHROPLASTY;  Surgeon: Hessie Dibble, MD;  Location: [place];  Service: Orthopedics;  Laterality: Left;  . TOTAL KNEE ARTHROPLASTY Right 08/22/2014   Procedure: RIGHT TOTAL KNEE ARTHROPLASTY;  Surgeon: Hessie Dibble, MD;  Location: [place];  Service: Orthopedics;  Laterality: Right;  . UMBILICAL HERNIA REPAIR    . UVULOPALATOPLASTY  1990's   ENT MD DR Rock Nephew    ROS- all systems are reviewed and negatives except as per HPI above  Current Outpatient Medications  Medication Sig Dispense Refill  . allopurinol (ZYLOPRIM) 300 MG tablet Take 1 tablet (300 mg total) by mouth daily. 100 tablet 4  . aspirin EC 81 MG tablet Take 81 mg by mouth daily.    . benazepril-hydrochlorthiazide (LOTENSIN HCT) 20-25 MG tablet TAKE 1 TABLET EVERY DAY 90 tablet 3  . cetirizine (ZYRTEC) 10 MG tablet Take 10 mg by mouth daily as needed for allergies.    . furosemide (LASIX) 20 MG tablet Take 1 tablet (20 mg total) by mouth daily as needed for fluid or edema. 100 tablet  3  . glipiZIDE (GLUCOTROL) 5 MG tablet Take 1 tablet (5 mg total) by mouth 2 (two) times daily. 200 tablet 4  . glucose blood (FREESTYLE LITE) test strip USE TWICE A DAY AS DIRECTED (E11.9) 200 each 1  . insulin glargine (LANTUS) 100 UNIT/ML injection INJECT  30 UNITS SUBCUTANEOUSLY AT BEDTIME (DISCARD AND BEGIN A NEW VIAL EVERY 28 DAYS) (Patient taking differently: INJECT  30 UNITS SUBCUTANEOUSLY AT BEDTIME (DISCARD AND BEGIN A NEW VIAL EVERY 28 DAYS) AS NEEDED) 30 mL 10  . levothyroxine (SYNTHROID, LEVOTHROID) 100 MCG tablet Take 1 tablet (100 mcg total) by mouth every morning. 100 tablet 4  . LORazepam (ATIVAN) 0.5 MG tablet Take 1 tablet (0.5 mg total) by mouth 2 (two) times daily as needed for anxiety. 60 tablet 2  . metFORMIN  (GLUCOPHAGE) 1000 MG tablet Take 1 tablet (1,000 mg total) by mouth 2 (two) times daily with a meal. (Patient taking differently: Take 1,000 mg 2 (two) times daily with a meal by mouth. ) 200 tablet 4  . Multiple Vitamin (MULTIVITAMIN PO) Take 1 tablet by mouth daily.      . nitroGLYCERIN (NITROSTAT) 0.4 MG SL tablet Place 1 tablet (0.4 mg total) under the tongue every 5 (five) minutes x 3 doses as needed for chest pain. 25 tablet 2  . Omega-3 Fatty Acids (FISH OIL) 1000 MG CAPS Take 1,000 mg by mouth daily.     [name] Kitchen omeprazole (PRILOSEC) 20 MG capsule Take 1 capsule (20 mg total) by mouth daily as needed (heartburn or acid reflux). 100 capsule 4  . oxybutynin (DITROPAN XL) 15 MG 24 hr tablet Take 15 mg by mouth daily.    . pioglitazone (ACTOS) 45 MG tablet TAKE 1 TABLET EVERY DAY 90 tablet 3  . polyethylene glycol (MIRALAX / GLYCOLAX) packet Take 17 g by mouth daily as needed for moderate constipation.    . Probiotic Product (ALIGN) 4 MG CAPS Take 1 capsule (4 mg total) by mouth daily. 90 capsule 3  . simvastatin (ZOCOR) 40 MG tablet TAKE 1 TABLET AT BEDTIME 90 tablet 4   No current facility-administered medications for this visit.     Physical Exam: Vitals:   01/09/18 1553  BP: 122/64  Pulse: 64  Weight: 248 lb (112.5 kg)  Height: 6' 2.5" (1.892 m)    GEN- The patient is well appearing, alert and oriented x 3 today.   Head- normocephalic, atraumatic Eyes-  Sclera clear, conjunctiva pink Ears- hearing intact Oropharynx- clear Lungs- Clear to ausculation bilaterally, normal work of breathing Heart- Regular rate and rhythm, no murmurs, rubs or gallops, PMI not laterally displaced GI- soft, NT, ND, + BS Extremities- no clubbing, cyanosis, or edema  Wt Readings from Last 3 Encounters:  01/09/18 248 lb (112.5 kg)  06/23/17 246 lb 8 oz (111.8 kg)  03/28/17 250 lb 12.8 oz (113.8 kg)    EKG tracing ordered today is personally reviewed and shows sinus rhythm with first degree AV block (PR  268 msec).  non conducted PACs are noted  Assessment and Plan:  1. CAD No ischemic symptoms No changes  2. First degree AV block Stable No change required today  3. HTN Stable No change required today  4. HL Stable No change required today Lipids from 5/18 reviewed.  He will have these repeat by PCP upon follow-up for annual exam.  Return in a year  Thompson Grayer MD, [facility] 01/09/2018 4:17 PM

## 2018-01-09 NOTE — Patient Instructions (Signed)

## 2018-01-23 ENCOUNTER — Other Ambulatory Visit: Payer: Self-pay | Admitting: Family Medicine

## 2018-01-26 ENCOUNTER — Telehealth: Payer: Self-pay | Admitting: Family Medicine

## 2018-01-26 NOTE — Telephone Encounter (Signed)
Spoke with [facility] pharmacy regarding clarification on how often pt uses the Lantus Rx. Issue resolved and pt notified

## 2018-01-26 NOTE — Telephone Encounter (Signed)
Copied from [place] [id number]. Topic: Quick Communication - See Telephone Encounter >> Jan 26, 2018 11:14 AM Rutherford Nail, NT wrote: CRM for notification. See Telephone encounter for: 01/26/18. Patient calling and states that he just spoke with [facility] and they are requesting a new prescription be faxed to them. Patient states they told him that they had some questions. Please advise.  Fax#: [phone]

## 2018-02-09 ENCOUNTER — Other Ambulatory Visit: Payer: Self-pay | Admitting: Family Medicine

## 2018-02-13 DIAGNOSIS — L570 Actinic keratosis: Secondary | ICD-10-CM | POA: Diagnosis not present

## 2018-02-13 DIAGNOSIS — C44519 Basal cell carcinoma of skin of other part of trunk: Secondary | ICD-10-CM | POA: Diagnosis not present

## 2018-02-13 DIAGNOSIS — L821 Other seborrheic keratosis: Secondary | ICD-10-CM | POA: Diagnosis not present

## 2018-02-13 DIAGNOSIS — Z85828 Personal history of other malignant neoplasm of skin: Secondary | ICD-10-CM | POA: Diagnosis not present

## 2018-02-13 DIAGNOSIS — D485 Neoplasm of uncertain behavior of skin: Secondary | ICD-10-CM | POA: Diagnosis not present

## 2018-02-13 DIAGNOSIS — Z85820 Personal history of malignant melanoma of skin: Secondary | ICD-10-CM | POA: Diagnosis not present

## 2018-02-20 ENCOUNTER — Other Ambulatory Visit: Payer: Self-pay | Admitting: Family Medicine

## 2018-04-18 ENCOUNTER — Other Ambulatory Visit: Payer: Self-pay | Admitting: Family Medicine

## 2018-04-19 ENCOUNTER — Ambulatory Visit: Payer: Medicare Other | Admitting: Family Medicine

## 2018-04-25 DIAGNOSIS — R361 Hematospermia: Secondary | ICD-10-CM | POA: Diagnosis not present

## 2018-05-08 ENCOUNTER — Ambulatory Visit (INDEPENDENT_AMBULATORY_CARE_PROVIDER_SITE_OTHER): Payer: Medicare Other | Admitting: Family Medicine

## 2018-05-08 ENCOUNTER — Ambulatory Visit: Payer: Medicare Other | Admitting: Family Medicine

## 2018-05-08 ENCOUNTER — Encounter: Payer: Self-pay | Admitting: Family Medicine

## 2018-05-08 VITALS — HR 49 | Temp 97.6°F | Wt 242.6 lb

## 2018-05-08 DIAGNOSIS — E038 Other specified hypothyroidism: Secondary | ICD-10-CM

## 2018-05-08 DIAGNOSIS — E785 Hyperlipidemia, unspecified: Secondary | ICD-10-CM | POA: Diagnosis not present

## 2018-05-08 DIAGNOSIS — K219 Gastro-esophageal reflux disease without esophagitis: Secondary | ICD-10-CM | POA: Diagnosis not present

## 2018-05-08 DIAGNOSIS — I10 Essential (primary) hypertension: Secondary | ICD-10-CM

## 2018-05-08 DIAGNOSIS — Z23 Encounter for immunization: Secondary | ICD-10-CM | POA: Diagnosis not present

## 2018-05-08 DIAGNOSIS — E118 Type 2 diabetes mellitus with unspecified complications: Secondary | ICD-10-CM | POA: Diagnosis not present

## 2018-05-08 DIAGNOSIS — I25810 Atherosclerosis of coronary artery bypass graft(s) without angina pectoris: Secondary | ICD-10-CM

## 2018-05-08 DIAGNOSIS — M109 Gout, unspecified: Secondary | ICD-10-CM | POA: Diagnosis not present

## 2018-05-08 LAB — CBC WITH DIFFERENTIAL/PLATELET
BASOS PCT: 0.3 % (ref 0.0–3.0)
Basophils Absolute: 0 10*3/uL (ref 0.0–0.1)
Eosinophils Absolute: 0 10*3/uL (ref 0.0–0.7)
Eosinophils Relative: 0.8 % (ref 0.0–5.0)
HCT: 43.3 % (ref 39.0–52.0)
Hemoglobin: 14.7 g/dL (ref 13.0–17.0)
LYMPHS PCT: 25.2 % (ref 12.0–46.0)
Lymphs Abs: 1.2 10*3/uL (ref 0.7–4.0)
MCHC: 34 g/dL (ref 30.0–36.0)
MCV: 91 fl (ref 78.0–100.0)
Monocytes Absolute: 0.5 10*3/uL (ref 0.1–1.0)
Monocytes Relative: 9.8 % (ref 3.0–12.0)
NEUTROS ABS: 3.1 10*3/uL (ref 1.4–7.7)
NEUTROS PCT: 63.9 % (ref 43.0–77.0)
Platelets: 212 10*3/uL (ref 150.0–400.0)
RBC: 4.76 Mil/uL (ref 4.22–5.81)
RDW: 14.7 % (ref 11.5–15.5)
WBC: 4.9 10*3/uL (ref 4.0–10.5)

## 2018-05-08 LAB — POCT URINALYSIS DIPSTICK
BILIRUBIN UA: NEGATIVE
GLUCOSE UA: NEGATIVE
Ketones, UA: NEGATIVE
Leukocytes, UA: NEGATIVE
Nitrite, UA: NEGATIVE
Protein, UA: NEGATIVE
RBC UA: NEGATIVE
SPEC GRAV UA: 1.02 (ref 1.010–1.025)
Urobilinogen, UA: 0.2 E.U./dL
pH, UA: 6 (ref 5.0–8.0)

## 2018-05-08 LAB — LIPID PANEL
Cholesterol: 129 mg/dL (ref 0–200)
HDL: 46.8 mg/dL
LDL Cholesterol: 60 mg/dL (ref 0–99)
NonHDL: 81.96
Total CHOL/HDL Ratio: 3
Triglycerides: 108 mg/dL (ref 0.0–149.0)
VLDL: 21.6 mg/dL (ref 0.0–40.0)

## 2018-05-08 LAB — BASIC METABOLIC PANEL WITH GFR
BUN: 17 mg/dL (ref 6–23)
CO2: 29 meq/L (ref 19–32)
Calcium: 9.6 mg/dL (ref 8.4–10.5)
Chloride: 104 meq/L (ref 96–112)
Creatinine, Ser: 0.84 mg/dL (ref 0.40–1.50)
GFR: 93.43 mL/min
Glucose, Bld: 71 mg/dL (ref 70–99)
Potassium: 4.1 meq/L (ref 3.5–5.1)
Sodium: 142 meq/L (ref 135–145)

## 2018-05-08 LAB — HEPATIC FUNCTION PANEL
ALT: 13 U/L (ref 0–53)
AST: 20 U/L (ref 0–37)
Albumin: 4.3 g/dL (ref 3.5–5.2)
Alkaline Phosphatase: 49 U/L (ref 39–117)
BILIRUBIN DIRECT: 0.1 mg/dL (ref 0.0–0.3)
BILIRUBIN TOTAL: 0.7 mg/dL (ref 0.2–1.2)
Total Protein: 6.4 g/dL (ref 6.0–8.3)

## 2018-05-08 LAB — MICROALBUMIN / CREATININE URINE RATIO
Creatinine,U: 138.5 mg/dL
Microalb Creat Ratio: 0.9 mg/g (ref 0.0–30.0)
Microalb, Ur: 1.2 mg/dL (ref 0.0–1.9)

## 2018-05-08 LAB — TSH: TSH: 1.87 u[IU]/mL (ref 0.35–4.50)

## 2018-05-08 LAB — HEMOGLOBIN A1C: Hgb A1c MFr Bld: 6.9 % — ABNORMAL HIGH (ref 4.6–6.5)

## 2018-05-08 MED ORDER — PIOGLITAZONE HCL 45 MG PO TABS: 45.0000 mg | ORAL_TABLET | Freq: Every day | ORAL | 3 refills | Status: AC

## 2018-05-08 MED ORDER — BENAZEPRIL-HYDROCHLOROTHIAZIDE 20-25 MG PO TABS: 1.0000 | ORAL_TABLET | Freq: Every day | ORAL | 3 refills | Status: DC

## 2018-05-08 MED ORDER — METFORMIN HCL 1000 MG PO TABS: 1000.0000 mg | ORAL_TABLET | Freq: Two times a day (BID) | ORAL | 4 refills | Status: DC

## 2018-05-08 MED ORDER — INSULIN GLARGINE 100 UNIT/ML ~~LOC~~ SOLN: SUBCUTANEOUS | 10 refills | Status: DC

## 2018-05-08 MED ORDER — ALLOPURINOL 300 MG PO TABS: 300.0000 mg | ORAL_TABLET | Freq: Every day | ORAL | 4 refills | Status: AC

## 2018-05-08 MED ORDER — LEVOTHYROXINE SODIUM 100 MCG PO TABS: 100.0000 ug | ORAL_TABLET | Freq: Every morning | ORAL | 4 refills | Status: DC

## 2018-05-08 MED ORDER — NITROGLYCERIN 0.4 MG SL SUBL: 0.4000 mg | SUBLINGUAL_TABLET | SUBLINGUAL | 2 refills | Status: DC | PRN

## 2018-05-08 MED ORDER — OMEPRAZOLE 20 MG PO CPDR: 20.0000 mg | DELAYED_RELEASE_CAPSULE | Freq: Every day | ORAL | 4 refills | Status: DC | PRN

## 2018-05-08 MED ORDER — SIMVASTATIN 40 MG PO TABS: 40.0000 mg | ORAL_TABLET | Freq: Every day | ORAL | 4 refills | Status: DC

## 2018-05-08 MED ORDER — GLIPIZIDE 5 MG PO TABS: 5.0000 mg | ORAL_TABLET | Freq: Two times a day (BID) | ORAL | 4 refills | Status: DC

## 2018-05-08 NOTE — Patient Instructions (Signed)
Labs today.......[name] Kitchen we will call if there is anything abnormal  Continue current medications  30 minutes of outside walking daily  Good luck to you and Maggie in the future and thank you for coming to see me all these years

## 2018-05-08 NOTE — Progress Notes (Signed)
Jonathan [name] is a delightful 82 year old married male non-smoker retired [employer] features editor who comes in today for annual physical examination  He has history of gout for which he takes allopurinol 3oo milligrams daily.  He takes Lotensin 20-25, for hypertension.  BP normal  Uses Zyrtec as needed for allergic rhinitis  Takes Lasix 20 mg as needed for fluid retention  He has diabetes type 1 for which takes glipizide 5 mg twice daily, metformin 1000 mg twice daily, and Lantus on a sliding scale.  He takes Synthroid 100 mcg daily because of a history of hypothyroidism  He always keeps a new bottle of nitroglycerin around.  He had cardiac bypass 15 years ago he is asymptomatic  He takes Prilosec 20 mg daily because a history of reflux esophagitis  He is been treated for BPH and urinary incontinence with Ditropan 50 mg daily and recently the urologist added finasteride.  He also takes Actos for his diabetes 45 mg daily.  He takes Zocor 40 mg daily along with an aspirin tab because a history of hyperlipidemia  He had a melanoma removed from the medial portion of his left knee by his dermatologist this past year.  He gets routine eye care, dental care, colonoscopies no longer indicated.  Vaccinations up-to-date information given on the new shingles vaccine.  Seasonal flu shot given today.  He said he was turned positive TB skin test years ago and was treated with BCG.  Pulse (!) 49   Temp 97.6 F (36.4 C) (Oral)   Wt 242 lb 9.6 oz (110 kg)   SpO2 98%   BMI 30.73 kg/m  Well-developed well nourished male no acute distress vital signs stable he is afebrile HEENT were negative neck was supple thyroid is not enlarged no carotid bruits.  Cardiopulmonary exam normal abdominal exam normal extremities normal skin normal peripheral pulses normal except for multiple scars from previous lesions removed and bilateral scars anterior knee from previous bilateral knee replacements  1.  Hypertension at  goal....... continue current therapy  2.  Hyperlipidemia.....[name] Kitchen continue current therapy check labs  3.  Diabetes type 1.....[name] Kitchen check labs  4.  History of gout..... Continue allopurinol  5.  Hypothyroidism........ continue Synthroid check labs  Reflux esophagitis....... continue Prilosec  7.  BPH with urinary incontinence followed by urology.  Continue Ditropan and finasteride  8.  Hyperlipidemia........... continue Zocor and aspirin  9.  Status post recent melanoma removal  10.  Status post bilateral knee replacements.

## 2018-05-09 LAB — HEPATITIS C ANTIBODY
HEP C AB: NONREACTIVE
SIGNAL TO CUT-OFF: 0.01 (ref <1.00)

## 2018-05-29 ENCOUNTER — Other Ambulatory Visit: Payer: Self-pay | Admitting: Family Medicine

## 2018-06-14 DIAGNOSIS — N401 Enlarged prostate with lower urinary tract symptoms: Secondary | ICD-10-CM | POA: Diagnosis not present

## 2018-06-14 DIAGNOSIS — N3941 Urge incontinence: Secondary | ICD-10-CM | POA: Diagnosis not present

## 2018-06-14 DIAGNOSIS — N3281 Overactive bladder: Secondary | ICD-10-CM | POA: Diagnosis not present

## 2018-06-26 DIAGNOSIS — M6289 Other specified disorders of muscle: Secondary | ICD-10-CM | POA: Diagnosis not present

## 2018-06-26 DIAGNOSIS — M6281 Muscle weakness (generalized): Secondary | ICD-10-CM | POA: Diagnosis not present

## 2018-06-26 DIAGNOSIS — N3941 Urge incontinence: Secondary | ICD-10-CM | POA: Diagnosis not present

## 2018-06-26 DIAGNOSIS — R350 Frequency of micturition: Secondary | ICD-10-CM | POA: Diagnosis not present

## 2018-06-26 DIAGNOSIS — R3915 Urgency of urination: Secondary | ICD-10-CM | POA: Diagnosis not present

## 2018-07-10 DIAGNOSIS — M79675 Pain in left toe(s): Secondary | ICD-10-CM | POA: Diagnosis not present

## 2018-07-11 DIAGNOSIS — N3941 Urge incontinence: Secondary | ICD-10-CM | POA: Diagnosis not present

## 2018-07-11 DIAGNOSIS — M6289 Other specified disorders of muscle: Secondary | ICD-10-CM | POA: Diagnosis not present

## 2018-07-11 DIAGNOSIS — R3915 Urgency of urination: Secondary | ICD-10-CM | POA: Diagnosis not present

## 2018-07-11 DIAGNOSIS — M6281 Muscle weakness (generalized): Secondary | ICD-10-CM | POA: Diagnosis not present

## 2018-07-11 DIAGNOSIS — R350 Frequency of micturition: Secondary | ICD-10-CM | POA: Diagnosis not present

## 2018-07-13 DIAGNOSIS — G8918 Other acute postprocedural pain: Secondary | ICD-10-CM | POA: Diagnosis not present

## 2018-07-13 DIAGNOSIS — M205X2 Other deformities of toe(s) (acquired), left foot: Secondary | ICD-10-CM | POA: Diagnosis not present

## 2018-07-21 DIAGNOSIS — Z89422 Acquired absence of other left toe(s): Secondary | ICD-10-CM | POA: Diagnosis not present

## 2018-08-18 DIAGNOSIS — Z89422 Acquired absence of other left toe(s): Secondary | ICD-10-CM | POA: Diagnosis not present

## 2018-08-25 DIAGNOSIS — I10 Essential (primary) hypertension: Secondary | ICD-10-CM | POA: Diagnosis not present

## 2019-01-11 ENCOUNTER — Telehealth: Payer: Self-pay

## 2019-01-11 NOTE — Telephone Encounter (Signed)
Spoke with pt regarding appt on 01/15/19. Pt stated she would rather do a telephone visit instead of a video visit. Pt was advise to check vitals prior to appt. Pt questions and concerns were address.

## 2019-01-15 ENCOUNTER — Encounter: Payer: Self-pay | Admitting: Internal Medicine

## 2019-01-15 ENCOUNTER — Other Ambulatory Visit: Payer: Self-pay

## 2019-01-15 ENCOUNTER — Telehealth (INDEPENDENT_AMBULATORY_CARE_PROVIDER_SITE_OTHER): Payer: Medicare Other | Admitting: Internal Medicine

## 2019-01-15 DIAGNOSIS — E785 Hyperlipidemia, unspecified: Secondary | ICD-10-CM

## 2019-01-15 DIAGNOSIS — I440 Atrioventricular block, first degree: Secondary | ICD-10-CM

## 2019-01-15 DIAGNOSIS — I25810 Atherosclerosis of coronary artery bypass graft(s) without angina pectoris: Secondary | ICD-10-CM

## 2019-01-15 DIAGNOSIS — I10 Essential (primary) hypertension: Secondary | ICD-10-CM | POA: Diagnosis not present

## 2019-01-15 MED ORDER — SIMVASTATIN 40 MG PO TABS: 40.0000 mg | ORAL_TABLET | Freq: Every day | ORAL | 3 refills | Status: DC

## 2019-01-15 MED ORDER — BENAZEPRIL-HYDROCHLOROTHIAZIDE 20-25 MG PO TABS: 1.0000 | ORAL_TABLET | Freq: Every day | ORAL | 3 refills | Status: DC

## 2019-01-15 MED ORDER — FUROSEMIDE 20 MG PO TABS: 20.0000 mg | ORAL_TABLET | Freq: Every day | ORAL | 3 refills | Status: AC | PRN

## 2019-01-15 NOTE — Progress Notes (Signed)
Electrophysiology TeleHealth Note   Due to national recommendations of social distancing due to [place] 19, an audio  telehealth visit is felt to be most appropriate for this patient at this time.  See MyChart message from today for the patient's consent to telehealth for [facility].  Unable to get  Virtual visit to work today.  We therefore did a phone visit.   Date:  01/15/2019   ID:  Jonathan [name], DOB Jun 30, 1936, MRN [id number]  Location: patient's home  Provider location: [place]  Evaluation Performed: Follow-up visit  PCP:  Dorena Cookey, MD (Inactive) They will use [facility] going forward   Electrophysiologist:  Dr Rayann Heman  Chief Complaint:  CAD  History of Present Illness:    Jonathan [name] is a 83 y.o. male who presents via audio conferencing for a telehealth visit today.  Since last being seen in our clinic, the patient reports doing very well.  They have moved to [place].  Today, he denies symptoms of palpitations, chest pain, shortness of breath,  lower extremity edema, dizziness, presyncope, or syncope.  The patient is otherwise without complaint today.  The patient denies symptoms of fevers, chills, cough, or new SOB worrisome for COVID 19.  Past Medical History:  Diagnosis Date  . Benign prostatic hypertrophy   . CAD (coronary artery disease)    CABG 2005 by Dr Cyndia Bent  . DJD (degenerative joint disease)    bilateral knee, gout  . GERD (gastroesophageal reflux disease)    HX OF  . Glomerulonephritis acute    at age 76, due to beta strep, resolved, renal calculi- passed spontaneously  . Gout   . Hepatitis A 1950  . Hepatitis B 1980's  . History of kidney stones    3-4 TIMES SINCE 1970 PASSED ON OWN  . HOH (hard of hearing)    wears hearing aids  . Hyperlipidemia   . Hypertension   . Hypothyroidism   . Incontinence of urine   . Legally blind in right eye, as defined in [place]   . Personal history of other infectious and  parasitic disease   . Sleep apnea 1990's   "before uvulectomy"  . Squamous cell carcinoma    "lots on my arms & face" (09/21/2013)  . Sun-damaged skin   . 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 Type II diabetes mellitus ([place])     Past Surgical History:  Procedure Laterality Date  . CARDIAC CATHETERIZATION    . CATARACT EXTRACTION W/ INTRAOCULAR LENS  IMPLANT, BILATERAL Bilateral   . CORONARY ARTERY BYPASS GRAFT  01/23/04   Dr Cyndia Bent  . CYSTOSCOPY WITH INSERTION OF UROLIFT N/A 06/23/2017   Procedure: CYSTOSCOPY WITH INSERTION OF UROLIFT;  Surgeon: Franchot Gallo, MD;  Location[facility];  Service: Urology;  Laterality: N/A;  . EYE SURGERY Right    detached retina ,blind in right eye  . INGUINAL HERNIA REPAIR Bilateral 1980's - 1990's  . MASTOIDECTOMY Right 1943  . QUADRICEPS REPAIR Right 2009  . RETINAL DETACHMENT SURGERY Right X 2  . SEPSIS  1943  . TONSILLECTOMY  ~ 1943  . TOTAL KNEE ARTHROPLASTY Left 09/18/2013   Procedure: TOTAL KNEE ARTHROPLASTY;  Surgeon: Hessie Dibble, MD;  Location: [facility];  Service: Orthopedics;  Laterality: Left;  . TOTAL KNEE ARTHROPLASTY Right 08/22/2014   Procedure: RIGHT TOTAL KNEE ARTHROPLASTY;  Surgeon: Hessie Dibble,  MD;  Location: [facility];  Service: Orthopedics;  Laterality: Right;  . UMBILICAL HERNIA REPAIR    . UVULOPALATOPLASTY  1990's   ENT MD DR Rock Nephew    Current Outpatient Medications  Medication Sig Dispense Refill  . allopurinol (ZYLOPRIM) 300 MG tablet Take 1 tablet (300 mg total) by mouth daily. 90 tablet 4  . aspirin EC 81 MG tablet Take 81 mg by mouth daily.    . benazepril-hydrochlorthiazide (LOTENSIN HCT) 20-25 MG tablet Take 1 tablet by mouth daily. 90 tablet 3  . cetirizine (ZYRTEC) 10 MG tablet Take 10 mg by mouth daily as needed for allergies.    . furosemide (LASIX) 20 MG tablet Take 1 tablet (20 mg total) by mouth daily as needed for fluid or edema. 100  tablet 3  . glipiZIDE (GLUCOTROL) 5 MG tablet Take 1 tablet (5 mg total) by mouth 2 (two) times daily. 180 tablet 4  . glucose blood (FREESTYLE LITE) test strip USE TWICE A DAY AS DIRECTED (E11.9) 200 each 1  . insulin glargine (LANTUS) 100 UNIT/ML injection INJECT  30 UNITS SUBCUTANEOUSLY AT BEDTIME (DISCARD AND BEGIN A NEW VIAL EVERY 28 DAYS) AS NEEDED 30 mL 10  . levothyroxine (SYNTHROID, LEVOTHROID) 100 MCG tablet Take 1 tablet (100 mcg total) by mouth every morning. 90 tablet 4  . metFORMIN (GLUCOPHAGE) 1000 MG tablet Take 1 tablet (1,000 mg total) by mouth 2 (two) times daily with a meal. 200 tablet 4  . Multiple Vitamin (MULTIVITAMIN PO) Take 1 tablet by mouth daily.      . nitroGLYCERIN (NITROSTAT) 0.4 MG SL tablet Place 1 tablet (0.4 mg total) under the tongue every 5 (five) minutes x 3 doses as needed for chest pain. 10 tablet 2  . Omega-3 Fatty Acids (FISH OIL) 1000 MG CAPS Take 1,000 mg by mouth daily.     [name] Kitchen omeprazole (PRILOSEC) 20 MG capsule Take 1 capsule (20 mg total) by mouth daily as needed (heartburn or acid reflux). 90 capsule 4  . oxybutynin (DITROPAN XL) 15 MG 24 hr tablet Take 15 mg by mouth daily.    . pioglitazone (ACTOS) 45 MG tablet Take 1 tablet (45 mg total) by mouth daily. 90 tablet 3  . polyethylene glycol (MIRALAX / GLYCOLAX) packet Take 17 g by mouth daily as needed for moderate constipation.    . Probiotic Product (ALIGN) 4 MG CAPS Take 1 capsule (4 mg total) by mouth daily. 90 capsule 3  . simvastatin (ZOCOR) 40 MG tablet Take 1 tablet (40 mg total) by mouth at bedtime. 90 tablet 4   No current facility-administered medications for this visit.     Allergies:   Bee venom   Social History:  The patient  reports that he quit smoking about 53 years ago. His smoking use included cigarettes. He has a 10.00 pack-year smoking history. He has never used smokeless tobacco. He reports current alcohol use. He reports that he does not use drugs.   Family History:  The  patient's  family history includes Diabetes in an other family member; Hypertension in his unknown relative.   ROS:  Please see the history of present illness.   All other systems are personally reviewed and negative.    Exam:    Vital Signs:  BP 127/81   Pulse 69   Ht 6' 2.5" (1.892 m)   Wt 223 lb (101.2 kg)   BMI 28.25 kg/m   Well appearing, alert and conversant, regular work of breathing,  good skin  color Eyes- anicteric, neuro- grossly intact, skin- no apparent rash or lesions or cyanosis, mouth- oral mucosa is pink   Labs/Other Tests and Data Reviewed:    Recent Labs: 05/08/2018: ALT 13; BUN 17; Creatinine, Ser 0.84; Hemoglobin 14.7; Platelets 212.0; Potassium 4.1; Sodium 142; TSH 1.87   Wt Readings from Last 3 Encounters:  01/15/19 223 lb (101.2 kg)  05/08/18 242 lb 9.6 oz (110 kg)  01/09/18 248 lb (112.5 kg)     Other studies personally reviewed: Additional studies/ records that were reviewed today include: my prior notes Review of the above records today demonstrates: as above  The patient presents wearable device technology report for my review today. On my review, the patient presents with [facility] tracings from today . The tracings reveal sinus bradycardia,  Baseline artifact limits interrogation at times.  I do not think that this tracing represents afib.   ASSESSMENT & PLAN:    1.  CAD No ischemic symptoms No changes  2. HTN Stable No change required today  3. HL Stable No change required today  4. First degree AV block asymptomatic  5. COVID 19 screen The patient denies symptoms of COVID 19 at this time.  The importance of social distancing was discussed today.  Follow-up:  12 months with me in office  Current medicines are reviewed at length with the patient today.   The patient does not have concerns regarding his medicines.  The following changes were made today:  none  Labs/ tests ordered today include:  No orders of the defined  types were placed in this encounter.   Patient Risk:  after full review of this patients clinical status, I feel that they are at moderate risk at this time.  Today, I have spent 15 minutes with the patient with telehealth technology discussing CAD .    Army Fossa, MD  01/15/2019 9:38 AM     [place] [address]  [place] [id number] [phone] (office) [phone] (fax)

## 2019-01-16 DIAGNOSIS — I440 Atrioventricular block, first degree: Secondary | ICD-10-CM | POA: Diagnosis not present

## 2019-01-16 DIAGNOSIS — E118 Type 2 diabetes mellitus with unspecified complications: Secondary | ICD-10-CM | POA: Diagnosis not present

## 2019-01-17 ENCOUNTER — Telehealth: Payer: Self-pay | Admitting: Internal Medicine

## 2019-01-17 NOTE — Telephone Encounter (Signed)
EKG received.  EKG reviewed by Dr. Rayann Heman.    Call placed to Pt by Dr. Rayann Heman to discuss EKG results.  Pt educated on s/s to be aware of for worsening heart block.

## 2019-01-17 NOTE — Telephone Encounter (Signed)
Patient called the office asking to speak with April. He would not be specific as to the reason why. Please call him.

## 2019-01-17 NOTE — Telephone Encounter (Signed)
Follow-up: Patient  stated he was not in any medical emergency. He just sent an echocardiogram to the office yesterday and wanted to make sure the office got it.

## 2019-01-17 NOTE — Telephone Encounter (Signed)
Pt with mobitz I second degree AV block.  Asymptomatic. He and I discussed at length.  He is aware to contact my office for symptoms of dizziness, presyncope, fatigue, SOB or any other changes. To ED with syncope.  We will continue to follow Avoid AV nodal agents.  Thompson Grayer MD, [facility] 01/17/2019 11:57 AM

## 2019-03-01 DIAGNOSIS — Z794 Long term (current) use of insulin: Secondary | ICD-10-CM | POA: Diagnosis not present

## 2019-03-01 DIAGNOSIS — Z961 Presence of intraocular lens: Secondary | ICD-10-CM | POA: Diagnosis not present

## 2019-03-01 DIAGNOSIS — E119 Type 2 diabetes mellitus without complications: Secondary | ICD-10-CM | POA: Diagnosis not present

## 2019-05-19 DIAGNOSIS — Z23 Encounter for immunization: Secondary | ICD-10-CM | POA: Diagnosis not present

## 2019-05-30 DIAGNOSIS — E039 Hypothyroidism, unspecified: Secondary | ICD-10-CM | POA: Diagnosis not present

## 2019-05-30 DIAGNOSIS — R948 Abnormal results of function studies of other organs and systems: Secondary | ICD-10-CM | POA: Diagnosis not present

## 2019-05-30 DIAGNOSIS — E118 Type 2 diabetes mellitus with unspecified complications: Secondary | ICD-10-CM | POA: Diagnosis not present

## 2019-05-30 DIAGNOSIS — E782 Mixed hyperlipidemia: Secondary | ICD-10-CM | POA: Diagnosis not present

## 2019-05-30 DIAGNOSIS — Z0189 Encounter for other specified special examinations: Secondary | ICD-10-CM | POA: Diagnosis not present

## 2019-05-30 DIAGNOSIS — Z125 Encounter for screening for malignant neoplasm of prostate: Secondary | ICD-10-CM | POA: Diagnosis not present

## 2019-06-01 DIAGNOSIS — M1A9XX0 Chronic gout, unspecified, without tophus (tophi): Secondary | ICD-10-CM | POA: Diagnosis not present

## 2019-06-01 DIAGNOSIS — Z0000 Encounter for general adult medical examination without abnormal findings: Secondary | ICD-10-CM | POA: Diagnosis not present

## 2019-06-01 DIAGNOSIS — N401 Enlarged prostate with lower urinary tract symptoms: Secondary | ICD-10-CM | POA: Diagnosis not present

## 2019-06-01 DIAGNOSIS — E039 Hypothyroidism, unspecified: Secondary | ICD-10-CM | POA: Diagnosis not present

## 2019-06-01 DIAGNOSIS — R350 Frequency of micturition: Secondary | ICD-10-CM | POA: Diagnosis not present

## 2019-06-01 DIAGNOSIS — I10 Essential (primary) hypertension: Secondary | ICD-10-CM | POA: Diagnosis not present

## 2019-06-01 DIAGNOSIS — E118 Type 2 diabetes mellitus with unspecified complications: Secondary | ICD-10-CM | POA: Diagnosis not present

## 2019-06-01 DIAGNOSIS — E782 Mixed hyperlipidemia: Secondary | ICD-10-CM | POA: Diagnosis not present

## 2019-06-01 DIAGNOSIS — R972 Elevated prostate specific antigen [PSA]: Secondary | ICD-10-CM | POA: Diagnosis not present

## 2019-06-01 DIAGNOSIS — K219 Gastro-esophageal reflux disease without esophagitis: Secondary | ICD-10-CM | POA: Diagnosis not present

## 2019-07-04 ENCOUNTER — Other Ambulatory Visit: Payer: Self-pay

## 2019-07-11 DIAGNOSIS — R972 Elevated prostate specific antigen [PSA]: Secondary | ICD-10-CM | POA: Diagnosis not present

## 2019-07-11 DIAGNOSIS — R351 Nocturia: Secondary | ICD-10-CM | POA: Diagnosis not present

## 2019-07-11 DIAGNOSIS — N401 Enlarged prostate with lower urinary tract symptoms: Secondary | ICD-10-CM | POA: Diagnosis not present

## 2019-07-12 ENCOUNTER — Telehealth: Payer: Self-pay | Admitting: *Deleted

## 2019-07-12 NOTE — Telephone Encounter (Signed)
Patient called me yesterday afternoon stating that he wanted to get in to see Dr. Rayann Heman.  He is having SOB with just walking.  Says BP and HR are both good as well as his O2.  He says that Dr. Rayann Heman told him to let him know if anything ever changed for him. He doesn't know if it's the cold weather or what but something is different and he would like to be seen or called.

## 2019-07-16 ENCOUNTER — Telehealth: Payer: Self-pay | Admitting: *Deleted

## 2019-07-16 ENCOUNTER — Other Ambulatory Visit: Payer: Self-pay

## 2019-07-16 ENCOUNTER — Encounter (HOSPITAL_COMMUNITY): Payer: Self-pay | Admitting: Emergency Medicine

## 2019-07-16 ENCOUNTER — Inpatient Hospital Stay (HOSPITAL_COMMUNITY)
Admission: EM | Admit: 2019-07-16 | Discharge: 2019-07-18 | DRG: 247 | Disposition: A | Discharge status: Home / Self Care | Payer: Medicare Other | Attending: Cardiovascular Disease | Admitting: Cardiovascular Disease

## 2019-07-16 ENCOUNTER — Emergency Department (HOSPITAL_COMMUNITY): Payer: Medicare Other

## 2019-07-16 DIAGNOSIS — Z955 Presence of coronary angioplasty implant and graft: Secondary | ICD-10-CM

## 2019-07-16 DIAGNOSIS — E039 Hypothyroidism, unspecified: Secondary | ICD-10-CM | POA: Diagnosis present

## 2019-07-16 DIAGNOSIS — I252 Old myocardial infarction: Secondary | ICD-10-CM | POA: Insufficient documentation

## 2019-07-16 DIAGNOSIS — K219 Gastro-esophageal reflux disease without esophagitis: Secondary | ICD-10-CM | POA: Diagnosis not present

## 2019-07-16 DIAGNOSIS — Z20828 Contact with and (suspected) exposure to other viral communicable diseases: Secondary | ICD-10-CM | POA: Diagnosis present

## 2019-07-16 DIAGNOSIS — I214 Non-ST elevation (NSTEMI) myocardial infarction: Principal | ICD-10-CM | POA: Diagnosis present

## 2019-07-16 DIAGNOSIS — Z7982 Long term (current) use of aspirin: Secondary | ICD-10-CM | POA: Diagnosis not present

## 2019-07-16 DIAGNOSIS — Z79890 Hormone replacement therapy (postmenopausal): Secondary | ICD-10-CM

## 2019-07-16 DIAGNOSIS — I25110 Atherosclerotic heart disease of native coronary artery with unstable angina pectoris: Secondary | ICD-10-CM | POA: Diagnosis present

## 2019-07-16 DIAGNOSIS — G4730 Sleep apnea, unspecified: Secondary | ICD-10-CM | POA: Diagnosis present

## 2019-07-16 DIAGNOSIS — N400 Enlarged prostate without lower urinary tract symptoms: Secondary | ICD-10-CM | POA: Diagnosis present

## 2019-07-16 DIAGNOSIS — M109 Gout, unspecified: Secondary | ICD-10-CM | POA: Diagnosis not present

## 2019-07-16 DIAGNOSIS — E119 Type 2 diabetes mellitus without complications: Secondary | ICD-10-CM | POA: Diagnosis present

## 2019-07-16 DIAGNOSIS — Z794 Long term (current) use of insulin: Secondary | ICD-10-CM | POA: Diagnosis not present

## 2019-07-16 DIAGNOSIS — Z951 Presence of aortocoronary bypass graft: Secondary | ICD-10-CM

## 2019-07-16 DIAGNOSIS — H9190 Unspecified hearing loss, unspecified ear: Secondary | ICD-10-CM | POA: Diagnosis not present

## 2019-07-16 DIAGNOSIS — Z8619 Personal history of other infectious and parasitic diseases: Secondary | ICD-10-CM | POA: Diagnosis not present

## 2019-07-16 DIAGNOSIS — I441 Atrioventricular block, second degree: Secondary | ICD-10-CM | POA: Diagnosis present

## 2019-07-16 DIAGNOSIS — Z91030 Bee allergy status: Secondary | ICD-10-CM | POA: Diagnosis not present

## 2019-07-16 DIAGNOSIS — R079 Chest pain, unspecified: Secondary | ICD-10-CM | POA: Diagnosis not present

## 2019-07-16 DIAGNOSIS — I361 Nonrheumatic tricuspid (valve) insufficiency: Secondary | ICD-10-CM | POA: Diagnosis not present

## 2019-07-16 DIAGNOSIS — H5461 Unqualified visual loss, right eye, normal vision left eye: Secondary | ICD-10-CM | POA: Diagnosis present

## 2019-07-16 DIAGNOSIS — E785 Hyperlipidemia, unspecified: Secondary | ICD-10-CM | POA: Diagnosis present

## 2019-07-16 DIAGNOSIS — I10 Essential (primary) hypertension: Secondary | ICD-10-CM | POA: Diagnosis present

## 2019-07-16 DIAGNOSIS — I25810 Atherosclerosis of coronary artery bypass graft(s) without angina pectoris: Secondary | ICD-10-CM | POA: Diagnosis not present

## 2019-07-16 DIAGNOSIS — Z96653 Presence of artificial knee joint, bilateral: Secondary | ICD-10-CM | POA: Diagnosis present

## 2019-07-16 DIAGNOSIS — M1990 Unspecified osteoarthritis, unspecified site: Secondary | ICD-10-CM | POA: Diagnosis not present

## 2019-07-16 DIAGNOSIS — Z87891 Personal history of nicotine dependence: Secondary | ICD-10-CM

## 2019-07-16 DIAGNOSIS — R0789 Other chest pain: Secondary | ICD-10-CM | POA: Diagnosis not present

## 2019-07-16 DIAGNOSIS — I340 Nonrheumatic mitral (valve) insufficiency: Secondary | ICD-10-CM | POA: Diagnosis not present

## 2019-07-16 DIAGNOSIS — Z974 Presence of external hearing-aid: Secondary | ICD-10-CM

## 2019-07-16 DIAGNOSIS — E118 Type 2 diabetes mellitus with unspecified complications: Secondary | ICD-10-CM | POA: Diagnosis present

## 2019-07-16 LAB — SARS CORONAVIRUS 2 (TAT 6-24 HRS): SARS Coronavirus 2: NEGATIVE

## 2019-07-16 LAB — CBC
HCT: 49.2 % (ref 39.0–52.0)
Hemoglobin: 16.6 g/dL (ref 13.0–17.0)
MCH: 31.5 pg (ref 26.0–34.0)
MCHC: 33.7 g/dL (ref 30.0–36.0)
MCV: 93.4 fL (ref 80.0–100.0)
Platelets: 184 10*3/uL (ref 150–400)
RBC: 5.27 MIL/uL (ref 4.22–5.81)
RDW: 14.4 % (ref 11.5–15.5)
WBC: 6.3 10*3/uL (ref 4.0–10.5)
nRBC: 0 % (ref 0.0–0.2)

## 2019-07-16 LAB — BASIC METABOLIC PANEL
Anion gap: 13 (ref 5–15)
BUN: 19 mg/dL (ref 8–23)
CO2: 23 mmol/L (ref 22–32)
Calcium: 9.4 mg/dL (ref 8.9–10.3)
Chloride: 102 mmol/L (ref 98–111)
Creatinine, Ser: 1.11 mg/dL (ref 0.61–1.24)
GFR calc Af Amer: >60 mL/min (ref >60)
GFR calc non Af Amer: >60 mL/min (ref >60)
Glucose, Bld: 192 mg/dL — ABNORMAL HIGH (ref 70–99)
Potassium: 4.3 mmol/L (ref 3.5–5.1)
Sodium: 138 mmol/L (ref 135–145)

## 2019-07-16 LAB — HEPARIN LEVEL (UNFRACTIONATED): Heparin Unfractionated: 0.66 IU/mL (ref 0.30–0.70)

## 2019-07-16 LAB — TROPONIN I (HIGH SENSITIVITY)
Troponin I (High Sensitivity): 754 ng/L (ref <18)
Troponin I (High Sensitivity): 761 ng/L (ref <18)

## 2019-07-16 LAB — CBG MONITORING, ED
Glucose-Capillary: 67 mg/dL — ABNORMAL LOW (ref 70–99)
Glucose-Capillary: 151 mg/dL — ABNORMAL HIGH (ref 70–99)

## 2019-07-16 LAB — GLUCOSE, CAPILLARY: Glucose-Capillary: 184 mg/dL — ABNORMAL HIGH (ref 70–99)

## 2019-07-16 MED ORDER — INSULIN ASPART 100 UNIT/ML ~~LOC~~ SOLN
0.0000 [IU] | Freq: Three times a day (TID) | SUBCUTANEOUS | Status: DC
  Administered 2019-07-17: 2 [IU] via SUBCUTANEOUS
  Administered 2019-07-17: 5 [IU] via SUBCUTANEOUS
  Administered 2019-07-18: 2 [IU] via SUBCUTANEOUS

## 2019-07-16 MED ORDER — HEPARIN BOLUS VIA INFUSION
4000.0000 [IU] | Freq: Once | INTRAVENOUS | Status: AC
  Administered 2019-07-16: 4000 [IU] via INTRAVENOUS
  Filled 2019-07-16: qty 4000

## 2019-07-16 MED ORDER — ASPIRIN 81 MG PO CHEW
324.0000 mg | CHEWABLE_TABLET | Freq: Once | ORAL | Status: AC
  Administered 2019-07-16: 12:00:00 324 mg via ORAL
  Filled 2019-07-16: qty 4

## 2019-07-16 MED ORDER — HEPARIN (PORCINE) 25000 UT/250ML-% IV SOLN
1350.0000 [IU]/h | INTRAVENOUS | Status: DC
  Administered 2019-07-16: 12:00:00 1350 [IU]/h via INTRAVENOUS
  Filled 2019-07-16 (×2): qty 250

## 2019-07-16 MED ORDER — SODIUM CHLORIDE 0.9% FLUSH
3.0000 mL | Freq: Two times a day (BID) | INTRAVENOUS | Status: DC
  Administered 2019-07-18: 3 mL via INTRAVENOUS

## 2019-07-16 MED ORDER — SODIUM CHLORIDE 0.9% FLUSH
3.0000 mL | Freq: Once | INTRAVENOUS | Status: AC
  Administered 2019-07-16: 3 mL via INTRAVENOUS

## 2019-07-16 NOTE — ED Notes (Signed)
Bedside report given to Mirna Mires, RN on [facility]

## 2019-07-16 NOTE — Progress Notes (Signed)
[place] for heparin Indication: chest pain/ACS  Heparin Dosing Weight: 102.5 kg  Labs: Recent Labs    07/16/19 1003  HGB 16.6  HCT 49.2  PLT 184  CREATININE 1.11  TROPONINIHS 754*    CrCl cannot be calculated (Unknown ideal weight.).  Assessment: 83yom presenting with CP, elevated high-sensitivity troponin. Pharmacy consulted to dose heparin. Patient is not on anticoagulation PTA. CBC wnl. No active bleed issues documented. Patient states he weighs 226 lbs and is 6'2".  Goal of Therapy:  Heparin level 0.3-0.7 units/ml Monitor platelets by anticoagulation protocol: Yes   Plan:  Heparin 4000 unit bolus Start heparin at 1350 units/h 6h heparin level Daily heparin level/CBC Monitor s/sx bleeding F/u Cardiology plans   Elicia Lamp, PharmD, BCPS Clinical Pharmacist 07/01/2019 8:49 PM

## 2019-07-16 NOTE — ED Notes (Signed)
Pt is brady arrhytmia on monitor

## 2019-07-16 NOTE — ED Triage Notes (Signed)
Pt reports chest pain while walking this am, took 1 sl nitro and pain resolved. States he also had some pain over the weekend that he took nitro for with relief, states he sees dr allred and is supposed to be worked up soon for possible pacemaker placement. Pt a/ox.

## 2019-07-16 NOTE — Telephone Encounter (Signed)
Patient called in to let us know he has been having chest pain on and off all weekend.  He took NTG with relief.  He had pain this morning and it has eased off now with NTG.  He has never had to take NTG before.  His wife stated that the pain seems to be with exertion.  He is not currently having pain and is going to [facility] ER to be checked out.  Will route to Dr Rayann Heman and Sonia Baller as an Juluis Rainier.  He has an appointment  Fri 07/20/19 ( see 12/30 phone note) and I let him know we would leave this scheduled for now.

## 2019-07-16 NOTE — H&P (Addendum)
History & Physical    Patient ID: HEAVEN GAZDIK MRN: [id number], DOB/AGE: 10/25/1935   Admit date: 07/16/2019  Primary Physician: Dorena Cookey, MD (Inactive) Primary Cardiologist: Dr. Thompson Grayer, MD   Patient Profile    Jonathan [name] is a 83 y.o. 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 Rayann Heman who presented to [facility] on 07/16/2019 with chest pain and SOB.   Past Medical History   Past Medical History:  Diagnosis Date   Benign prostatic hypertrophy    CAD (coronary artery disease)    CABG 2005 by Dr Cyndia Bent   DJD (degenerative joint disease)    bilateral knee, gout   GERD (gastroesophageal reflux disease)    HX OF   Glomerulonephritis acute    at age 32, due to beta strep, resolved, renal calculi- passed spontaneously   Gout    Hepatitis A 1950   Hepatitis B 1980's   History of kidney stones    3-4 TIMES SINCE 1970 PASSED ON OWN   HOH (hard of hearing)    wears hearing aids   Hyperlipidemia    Hypertension    Hypothyroidism    Incontinence of urine    Legally blind in right eye, as defined in [place]    Personal history of other infectious and parasitic disease    Sleep apnea 1990's   "before uvulectomy"   Squamous cell carcinoma    "lots on my arms & face" (09/21/2013)   Sun-damaged skin    Tuberculosis    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)     Past Surgical History:  Procedure Laterality Date   CARDIAC CATHETERIZATION     CATARACT EXTRACTION W/ INTRAOCULAR LENS  IMPLANT, BILATERAL Bilateral    CORONARY ARTERY BYPASS GRAFT  01/23/04   Dr Cyndia Bent   CYSTOSCOPY WITH INSERTION OF UROLIFT N/A 06/23/2017   Procedure: CYSTOSCOPY WITH INSERTION OF UROLIFT;  Surgeon: Franchot Gallo, MD;  Location: [facility];  Service: Urology;  Laterality: N/A;   EYE SURGERY Right    detached retina  ,blind in right eye   INGUINAL HERNIA REPAIR Bilateral 1980's - 1990's   MASTOIDECTOMY Right 1943   QUADRICEPS REPAIR Right 2009   RETINAL DETACHMENT SURGERY Right X 2   SEPSIS  1943   TONSILLECTOMY  ~ 1943   TOTAL KNEE ARTHROPLASTY Left 09/18/2013   Procedure: TOTAL KNEE ARTHROPLASTY;  Surgeon: Hessie Dibble, MD;  Location: [place];  Service: Orthopedics;  Laterality: Left;   TOTAL KNEE ARTHROPLASTY Right 08/22/2014   Procedure: RIGHT TOTAL KNEE ARTHROPLASTY;  Surgeon: Hessie Dibble, MD;  Location: [place];  Service: Orthopedics;  Laterality: Right;   UMBILICAL HERNIA REPAIR     UVULOPALATOPLASTY  1990's   ENT MD DR Rock Nephew     Allergies  Allergies  Allergen Reactions   Bee Venom Anaphylaxis   History of Present Illness    Jonathan [name] is an 83 yo M with a hx as stated above who presented to [facility] 07/16/2019 with a 3-day history of chest pain and SOB which became more noticeable while walking earlier today. Pt reports that he was in his usual state of health prior to last Thursday at which time he states he was walking on his typical 2-3 mile walk and he began having DOE which is unusual  for him. He states that he went home and took 2 SL NTG tablets with complete relief. He then called the office at which time he was scheduled for an in-office follow up visit. He was doing well over the weekend however had a similar episode earlier today while walking again. Given this, he called the office once again and was prompted to come to the ED.    In the ED, EKG showed NSR with 2ndegree AV block and PVC's with mild ST depression V3 otherwise no significant change from previous tracing.  HsT was found to be elevated at 754>>>761. He was started on IV Hep gtt and given ASA out of concern for ACS. CXR with no acute findings. Covid screen has not yet returned. He denies LE edema however states that he did take his PRN Lasix to see if this would help. He denies dizziness, orthopnea, fever,  chills, N/V, loss of taste or smell. No pre-syncope or syncopal episodes.   He was last seen by Dr. Rayann Heman via telemedicine visit for follow up on 01/15/2019 and was doing well from a cardiac perspective. Given the telephone visit, he was then sent to the office for routine EKG and was found to have worsening 1st degree AV>>to 2nd degree Mobitz Type I AV block with recommendations to call the office if any new or suspicious symptoms such as dizziness, presyncope, fatigue, SOB or any other changes should arise. Given no symptoms of AV block at that time, plans were to monitor with low threshold for PPM placement.   Home Medications    Prior to Admission medications   Medication Sig Start Date End Date Taking? Authorizing Provider  allopurinol (ZYLOPRIM) 300 MG tablet Take 1 tablet (300 mg total) by mouth daily. 05/08/18  Yes Dorena Cookey, MD  aspirin EC 81 MG tablet Take 81 mg by mouth daily.   Yes [provider]  benazepril-hydrochlorthiazide (LOTENSIN HCT) 20-25 MG tablet Take 1 tablet by mouth daily. 01/15/19  Yes Allred, Jeneen Rinks, MD  cetirizine (ZYRTEC) 10 MG tablet Take 10 mg by mouth daily as needed for allergies.   Yes [provider]  furosemide (LASIX) 20 MG tablet Take 1 tablet (20 mg total) by mouth daily as needed for fluid or edema. 01/15/19  Yes Allred, Jeneen Rinks, MD  glipiZIDE (GLUCOTROL) 5 MG tablet Take 1 tablet (5 mg total) by mouth 2 (two) times daily. 05/08/18  Yes Dorena Cookey, MD  insulin glargine (LANTUS) 100 UNIT/ML injection Inject 5-50 Units into the skin 2 (two) times daily. Per sliding scale 06/19/19  Yes [provider]  levothyroxine (SYNTHROID, LEVOTHROID) 100 MCG tablet Take 1 tablet (100 mcg total) by mouth every morning. 05/08/18  Yes Dorena Cookey, MD  metFORMIN (GLUCOPHAGE) 1000 MG tablet Take 1 tablet (1,000 mg total) by mouth 2 (two) times daily with a meal. 05/08/18  Yes Dorena Cookey, MD  Multiple Vitamin (MULTIVITAMIN PO) Take 1 tablet  by mouth daily.     Yes [provider]  nitroGLYCERIN (NITROSTAT) 0.4 MG SL tablet Place 1 tablet (0.4 mg total) under the tongue every 5 (five) minutes x 3 doses as needed for chest pain. 05/08/18  Yes Dorena Cookey, MD  Omega-3 Fatty Acids (FISH OIL) 1000 MG CAPS Take 1,000 mg by mouth daily.    Yes [provider]  omeprazole (PRILOSEC) 20 MG capsule Take 1 capsule (20 mg total) by mouth daily as needed (heartburn or acid reflux). Patient taking differently: Take 20  mg by mouth daily.  05/08/18  Yes Dorena Cookey, MD  oxybutynin (DITROPAN XL) 15 MG 24 hr tablet Take 15 mg by mouth daily. 10/25/17  Yes [provider]  pioglitazone (ACTOS) 45 MG tablet Take 1 tablet (45 mg total) by mouth daily. 05/08/18  Yes Dorena Cookey, MD  polycarbophil (FIBERCON) 625 MG tablet Take 1,250 mg by mouth 2 (two) times daily.    Yes [provider]  polyethylene glycol (MIRALAX / GLYCOLAX) packet Take 17 g by mouth daily as needed for moderate constipation.   Yes [provider]  Probiotic Product (ALIGN) 4 MG CAPS Take 1 capsule (4 mg total) by mouth daily. 12/19/15  Yes Nafziger, Tommi Rumps, NP  simvastatin (ZOCOR) 40 MG tablet Take 1 tablet (40 mg total) by mouth at bedtime. 01/15/19  Yes Allred, Jeneen Rinks, MD  glucose blood (FREESTYLE LITE) test strip USE TWICE A DAY AS DIRECTED (E11.9) 05/29/18   Dorena Cookey, MD  insulin glargine (LANTUS) 100 UNIT/ML injection INJECT  30 UNITS SUBCUTANEOUSLY AT BEDTIME (DISCARD AND BEGIN A NEW VIAL EVERY 28 DAYS) AS NEEDED Patient not taking: Reported on 07/16/2019 05/08/18   Dorena Cookey, MD    Family History    Family History  Problem Relation Age of Onset   Diabetes Other        1st degree relative   Hypertension Other        Family History   Social History    Social History   Socioeconomic History   Marital status: Married    Spouse name: Not on file   Number of children: Not on file   Years of education: Not on  file   Highest education level: Not on file  Occupational History   Occupation: retired  Scientist, product/process development strain: Not on file   Food insecurity    Worry: Not on file    Inability: Not on Lexicographer needs    Medical: Not on file    Non-medical: Not on file  Tobacco Use   Smoking status: Former Smoker    Packs/day: 1.00    Years: 10.00    Pack years: 10.00    Types: Cigarettes    Quit date: 09/09/1965    Years since quitting: 53.8   Smokeless tobacco: Never Used  Substance and Sexual Activity   Alcohol use: Yes    Comment: Glass of wine on social occasion or out to ear   Drug use: No   Sexual activity: Not Currently  Lifestyle   Physical activity    Days per week: Not on file    Minutes per session: Not on file   Stress: Not on file  Relationships   Social connections    Talks on phone: Not on file    Gets together: Not on file    Attends religious service: Not on file    Active member of club or organization: Not on file    Attends meetings of clubs or organizations: Not on file    Relationship status: Not on file   Intimate partner violence    Fear of current or ex partner: Not on file    Emotionally abused: Not on file    Physically abused: Not on file    Forced sexual activity: Not on file  Other Topics Concern   Not on file  Social History Narrative   Pt lives in [place].  Retired Magazine features editor (retired 2005)  Review of Systems   General: Denies fevers, chills, myalgias. Admits to good overall health. Cardiovascular: + chest pain, SOB. No palpitations. Denies lower extremity swelling.  Respiratory- + SOB, coughing, wheezing. No increased respirations, or sputum production. Denies the need for extra pillows for sleep at night.  Gastrointestinal: Denies abdominal, epigastric pain, nausea and vomiting.  Neurological: Denies headaches, syncope, falls, extremity weakness, numbness, and tingling. No changes in  mental status.   All other systems reviewed and are otherwise negative except as noted above.  Physical Exam    Blood pressure 109/73, pulse (!) 123, temperature 97.7 F (36.5 C), resp. rate 14, height 6\' 2"  (1.88 m), weight 102.5 kg, SpO2 94 %.   General: Pleasant, well-nourished, A&O in NAD, appears stated age Psych: Normal affect. Neuro: Alert and oriented X 3. Moves all extremities spontaneously. Neck: Supple neck veins, no bruits or JVD.Trachea midline, no cervical lymphadenopathy.  Lungs: Symmetrical chest wall expansion.CTA w/ RRR. No accessory muscle use.  Heart: Irregular no s3, s4, or murmurs.  Abdomen: Soft, non-tender, non-distended Extremities: DP//Radials 2+ and equal bilaterally. MSK: No palpable chest wall or thoracic tenderness   Labs    Troponin (Point of Care Test) No results for input(s): TROPIPOC in the last 72 hours. No results for input(s): CKTOTAL, CKMB, TROPONINI in the last 72 hours. Lab Results  Component Value Date   WBC 6.3 07/16/2019   HGB 16.6 07/16/2019   HCT 49.2 07/16/2019   MCV 93.4 07/16/2019   PLT 184 07/16/2019    Recent Labs  Lab 07/16/19 1003  NA 138  K 4.3  CL 102  CO2 23  BUN 19  CREATININE 1.11  CALCIUM 9.4  GLUCOSE 192*   Lab Results  Component Value Date   CHOL 129 05/08/2018   HDL 46.80 05/08/2018   LDLCALC 60 05/08/2018   TRIG 108.0 05/08/2018   No results found for: [facility]   Radiology Studies    Dg Chest 2 View  Result Date: 07/16/2019 CLINICAL DATA:  Substernal chest pain improved with nitroglycerin. EXAM: CHEST - 2 VIEW COMPARISON:  08/24/2014 FINDINGS: Previous median sternotomy and CABG. Heart size is normal. Aortic atherosclerosis with tortuosity. Pulmonary vascularity is normal. The lungs are clear. No effusions. No acute bone finding. IMPRESSION: No active disease. Previous CABG. Aortic atherosclerosis. Electronically Signed   By: Nelson Chimes M.D.   On: 07/16/2019 10:35    ECG & Cardiac Imaging     Echocardiogram 02/25/2014: Study Conclusions   - Left ventricle: The cavity size was normal. Wall thickness was  normal. Systolic function was normal. The estimated ejection  fraction was in the range of 55% to 60%. Wall motion was normal;  there were no regional wall motion abnormalities.  - Aortic valve: There was trivial regurgitation.  - Mitral valve: There was mild regurgitation.  - Left atrium: The atrium was moderately dilated.  - Right ventricle: The cavity size was mildly dilated. Wall  thickness was normal.  - Right atrium: The atrium was moderately dilated.   Lexiscan stress test 11/02/2011: Normal per Epic report   EKG 07/16/2019: 2ndegree AV block and PVC's with mild ST depression V3 otherwise no significant change from previous tracing. 80bpm  Assessment & Plan    1. NSTEMI with hx of CAD s/p CABG 2005: -Presented to [facility] 07/16[facility]2019 with a 3-day history of exertional chest pain and SOB which became more noticeable while walking earlier today. Pt reports that he was in his usual state of health prior to last  Thursday at which time he states he was walking on his typical 2-3 mile walk and he began having DOE which is unusual for him>>relieved with SL NTG with recurrent episode earlier today. -EKG showed NSR with 2ndegree AV block and PVC's with mild ST depression V3 otherwise no significant change from previous tracing.   -HsT was found to be elevated at 754>>>761. -Continue IV Hep gtt for ACS -Continue ASA, no BB given Type II AV block, statin   -CXR with no acute findings -Will update echocardiogram and plan for cath tomorrow for further assessment of coronary anatomy -The risks and benefits of a cardiac catheterization including, but not limited to, death, stroke, MI, kidney damage and bleeding were discussed with the patient who indicates understanding and agrees to proceed.   2. Type II AV block: -Followed by Dr. Rayann Heman, last seen 01/2019 at which time he was  asymptomatic however found to have worsening AV block per EKG. No plans for PPM at that time given no symptoms but with low threshold if symptoms were to arise.  -Will plan for cath to evaluate coronary anatomy and watch on telemetry during this time>>>if symptoms persist despite intervention, may need EP referral while here to evaluate for PPM placement at that time.   3. HLD: -Last LDL, 60 in 2019>>will update  -Continue statin   4. HTN: -Low, 104/92>105/62>114/65 -Will hold home dose Lotensin for now until further assessment  -If EF low, will adjust medications for guideline directed therapy.   5. DM2: -Last HbA1c, 6.9 in 2019>>update  -On home glipizide, metformin>>hold, actos and Lantus -Plae on SSI for glucose control while inpatient status -Would benefit from SLGT2    Severity of Illness: The appropriate patient status for this patient is INPATIENT. Inpatient status is judged to be reasonable and necessary in order to provide the required intensity of service to ensure the patient's safety. The patient's presenting symptoms, physical exam findings, and initial radiographic and laboratory data in the context of their chronic comorbidities is felt to place them at high risk for further clinical deterioration. Furthermore, it is not anticipated that the patient will be medically stable for discharge from the hospital within 2 midnights of admission. The following factors support the patient status of inpatient.   " The patient's presenting symptoms include chest pain. " The worrisome physical exam findings include elevated HsT. " The initial radiographic and laboratory data are worrisome because of HsT and EKG changes. " The chronic co-morbidities include DM2, CAD.   * I certify that at the point of admission it is my clinical judgment that the patient will require inpatient hospital care spanning beyond 2 midnights from the point of admission due to high intensity of service, high  risk for further deterioration and high frequency of surveillance required.*     Signed, Jonathan Drown NP-C [facility] Pager: [phone] 07/16/2019, @NOW   I have personally seen and examined this patient with Jonathan Drown, NP. I agree with the assessment and plan as outlined above.  83 yo male with history of CAD s/p CABG in 2005, HTN, HLD, DM and AV block who is presenting to the ED with c/o chest pain and dyspnea with exertion over the past two weeks. He has done well since his bypass surgery in 2005. 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 He has noticed chest pain and dyspnea with mild to moderate exertion.  EKG today is reviewed by me and shows sinus with second degree AV block. There is  lateral ST depression/TWI  Telemetry shows sinus with second degree AV block  Labs reviewed by me. Troponin 761. Renal function normal.  My exam:  General: Well developed, well nourished, NAD  HEENT: OP clear, mucus membranes moist  SKIN: warm, dry. No rashes.  Neuro: No focal deficits  Musculoskeletal: Muscle strength 5/5 all ext  Psychiatric: Mood and affect normal  Neck: No JVD, no carotid bruits, no thyromegaly, no lymphadenopathy.  Lungs:Clear bilaterally, no wheezes, rhonci, crackles  Cardiovascular: Regular rate and rhythm. No murmurs, gallops or rubs.  Abdomen:Soft. Bowel sounds present. Non-tender.  Extremities: No lower extremity edema. Pulses are 2 + in the bilateral DP/PT.   Plan: NSTEMI: He had bypass in 2005. (LIMA to LAD, SVG to Diag, SVG sequential to OM1 and OM2, SVG to RCA). Symptoms consistent with unstable angina/NSTEMI. No chest pain currently. No cath since he had bypass. Will admit to telemetry. NPO at midnight for cardiac cath tomorrow. Continue IV heparin. Will arrange an echo to assess LV function.   Lauree Chandler  07/16/2019  3:58 PM

## 2019-07-16 NOTE — Progress Notes (Signed)
[place] for heparin Indication: chest pain/ACS  Heparin Dosing Weight: 102.5 kg  Labs: Recent Labs    07/16/19 1003 07/16/19 1144 07/16/19 2017  HGB 16.6  --   --   HCT 49.2  --   --   PLT 184  --   --   HEPARINUNFRC  --   --  0.66  CREATININE 1.11  --   --   TROPONINIHS 754* 761*  --     Estimated Creatinine Clearance: 66.7 mL/min (by C-G formula based on SCr of 1.11 mg/dL).  Assessment: 83yom presenting with CP, elevated high-sensitivity troponin. Pharmacy consulted to dose heparin.   Heparin level this evening is therapeutic (HL 0.66, goal of 0.3-0.7). No bleeding noted at this time.   Goal of Therapy:  Heparin level 0.3-0.7 units/ml Monitor platelets by anticoagulation protocol: Yes   Plan:  - Continue Heparin at 1350 units/hr (13.5 ml/hr) - Will continue to monitor for any signs/symptoms of bleeding and will follow up with heparin level in 8 hours   Thank you for allowing pharmacy to be a part of this patient's care.  Alycia Rossetti, PharmD, BCPS Clinical Pharmacist Clinical phone for 07/16/2019: [phone] 07/16/2019 8:59 PM   **Pharmacist phone directory can now be found on [email or web address] ([email or web address]).  Listed under [place].

## 2019-07-16 NOTE — ED Provider Notes (Signed)
[facility] EMERGENCY DEPARTMENT Provider Note   CSN: [id number] Arrival date & time: 07/16/19  [id number]     History   Chief Complaint Chief Complaint  Patient presents with  . Chest Pain    HPI Jonathan [name] is a 83 y.o. male.     83 yo M with a cc of chest pain and sob.  Going on for the past week. Occurs with exertion.  No diaphoresis, n/v.  Had some weight gain over the past four days.  Tried some Lasix with some weight improvement but no improvement of the symptoms.  Felt like he was also congested at the onset of the shortness of breath but that is resolved as well.  He had nitroglycerin at home and he tried it 2 days ago and it improved his symptoms.  Try to get this morning.  Called his cardiologist he is currently seeing an electrophysiologist for Mobitz type II.  The patient was concerned that maybe he was having worsening bradycardia that was causing him to have shortness of breath.  Had checked it on his wife's home heart rate monitor and found to be slow at times.  The history is provided by the patient.  Chest Pain Pain location:  Substernal area Pain quality: dull   Pain severity:  Moderate Onset quality:  Sudden Duration:  2 days Timing:  Constant Progression:  Worsening Chronicity:  New Relieved by:  Nothing Worsened by:  Nothing Ineffective treatments:  None tried Associated symptoms: shortness of breath   Associated symptoms: no abdominal pain, no fever, no headache, no palpitations and no vomiting     Past Medical History:  Diagnosis Date  . Benign prostatic hypertrophy   . CAD (coronary artery disease)    CABG 2005 by Dr Cyndia Bent  . DJD (degenerative joint disease)    bilateral knee, gout  . GERD (gastroesophageal reflux disease)    HX OF  . Glomerulonephritis acute    at age 29, due to beta strep, resolved, renal calculi- passed spontaneously  . Gout   . Hepatitis A 1950  . Hepatitis B 1980's  . History of kidney stones    3-4  TIMES SINCE 1970 PASSED ON OWN  . HOH (hard of hearing)    wears hearing aids  . Hyperlipidemia   . Hypertension   . Hypothyroidism   . Incontinence of urine   . Legally blind in right eye, as defined in [place]   . Personal history of other infectious and parasitic disease   . Sleep apnea 1990's   "before uvulectomy"  . Squamous cell carcinoma    "lots on my arms & face" (09/21/2013)  . Sun-damaged skin   . 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 Type II diabetes mellitus [facility])     Patient Active Problem List   Diagnosis Date Noted  . Atypical nevus of shoulder 12/17/2014  . Primary localized osteoarthritis of right knee 08/22/2014  . Primary osteoarthritis of right knee 08/22/2014  . First degree AV block 02/04/2014  . GERD (gastroesophageal reflux disease) 12/06/2013  . Blindness of right eye 12/04/2013  . Total knee replacement status 09/18/2013  . Knee pain, bilateral 11/30/2012  . Foreign body granuloma of soft tissue, NEC, right lower leg 11/30/2012  . Edema 10/25/2011  . Coronary atherosclerosis 06/26/2009  . RENAL CALCULUS, RECURRENT 01/03/2009  . BPH (benign prostatic hypertrophy) 05/30/2007  . Hypothyroidism 04/07/2007  .  Type 2 diabetes mellitus with complication ([place]) [id number]  . Hyperlipidemia 04/07/2007  . Gout 04/07/2007  . Essential hypertension 04/07/2007  . DERMATITIS, CNTCT, ACUTE D/T SOLAR RADIATION 04/07/2007  . HEPATITIS B, HX OF 04/07/2007    Past Surgical History:  Procedure Laterality Date  . CARDIAC CATHETERIZATION    . CATARACT EXTRACTION W/ INTRAOCULAR LENS  IMPLANT, BILATERAL Bilateral   . CORONARY ARTERY BYPASS GRAFT  01/23/04   Dr Cyndia Bent  . CYSTOSCOPY WITH INSERTION OF UROLIFT N/A 06/23/2017   Procedure: CYSTOSCOPY WITH INSERTION OF UROLIFT;  Surgeon: Franchot Gallo, MD;  [facility];  Service: Urology;  Laterality: N/A;  . EYE SURGERY Right    detached retina  ,blind in right eye  . INGUINAL HERNIA REPAIR Bilateral 1980's - 1990's  . MASTOIDECTOMY Right 1943  . QUADRICEPS REPAIR Right 2009  . RETINAL DETACHMENT SURGERY Right X 2  . SEPSIS  1943  . TONSILLECTOMY  ~ 1943  . TOTAL KNEE ARTHROPLASTY Left 09/18/2013   Procedure: TOTAL KNEE ARTHROPLASTY;  Surgeon: Hessie Dibble, MD;  Location: [facility];  Service: Orthopedics;  Laterality: Left;  . TOTAL KNEE ARTHROPLASTY Right 08/22/2014   Procedure: RIGHT TOTAL KNEE ARTHROPLASTY;  Surgeon: Hessie Dibble, MD;  Location: [place];  Service: Orthopedics;  Laterality: Right;  . UMBILICAL HERNIA REPAIR    . UVULOPALATOPLASTY  1990's   ENT MD DR [place] Medications    Prior to Admission medications   Medication Sig Start Date End Date Taking? Authorizing Provider  allopurinol (ZYLOPRIM) 300 MG tablet Take 1 tablet (300 mg total) by mouth daily. 05/08/18   Dorena Cookey, MD  aspirin EC 81 MG tablet Take 81 mg by mouth daily.    [provider]  benazepril-hydrochlorthiazide (LOTENSIN HCT) 20-25 MG tablet Take 1 tablet by mouth daily. 01/15/19   Allred, Jeneen Rinks, MD  cetirizine (ZYRTEC) 10 MG tablet Take 10 mg by mouth daily as needed for allergies.    [provider]  furosemide (LASIX) 20 MG tablet Take 1 tablet (20 mg total) by mouth daily as needed for fluid or edema. 01/15/19   Allred, Jeneen Rinks, MD  glipiZIDE (GLUCOTROL) 5 MG tablet Take 1 tablet (5 mg total) by mouth 2 (two) times daily. 05/08/18   Dorena Cookey, MD  glucose blood (FREESTYLE LITE) test strip USE TWICE A DAY AS DIRECTED (E11.9) 05/29/18   Dorena Cookey, MD  insulin glargine (LANTUS) 100 UNIT/ML injection INJECT  30 UNITS SUBCUTANEOUSLY AT BEDTIME (DISCARD AND BEGIN A NEW VIAL EVERY 28 DAYS) AS NEEDED 05/08/18   Dorena Cookey, MD  levothyroxine (SYNTHROID, LEVOTHROID) 100 MCG tablet Take 1 tablet (100 mcg total) by mouth every morning. 05/08/18   Dorena Cookey, MD  metFORMIN (GLUCOPHAGE) 1000 MG tablet Take 1  tablet (1,000 mg total) by mouth 2 (two) times daily with a meal. 05/08/18   Dorena Cookey, MD  Multiple Vitamin (MULTIVITAMIN PO) Take 1 tablet by mouth daily.      [provider]  nitroGLYCERIN (NITROSTAT) 0.4 MG SL tablet Place 1 tablet (0.4 mg total) under the tongue every 5 (five) minutes x 3 doses as needed for chest pain. 05/08/18   Dorena Cookey, MD  Omega-3 Fatty Acids (FISH OIL) 1000 MG CAPS Take 1,000 mg by mouth daily.     [provider]  omeprazole (PRILOSEC) 20 MG capsule Take 1 capsule (20 mg total) by mouth daily as  needed (heartburn or acid reflux). 05/08/18   Dorena Cookey, MD  oxybutynin (DITROPAN XL) 15 MG 24 hr tablet Take 15 mg by mouth daily. 10/25/17   [provider]  pioglitazone (ACTOS) 45 MG tablet Take 1 tablet (45 mg total) by mouth daily. 05/08/18   Dorena Cookey, MD  polyethylene glycol (MIRALAX / Floria Raveling) packet Take 17 g by mouth daily as needed for moderate constipation.    [provider]  Probiotic Product (ALIGN) 4 MG CAPS Take 1 capsule (4 mg total) by mouth daily. 12/19/15   Nafziger, Tommi Rumps, NP  simvastatin (ZOCOR) 40 MG tablet Take 1 tablet (40 mg total) by mouth at bedtime. 01/15/19   Thompson Grayer, MD    Family History Family History  Problem Relation Age of Onset  . Diabetes Other        1st degree relative  . Hypertension Other        Family History    Social History Social History   Tobacco Use  . Smoking status: Former Smoker    Packs/day: 1.00    Years: 10.00    Pack years: 10.00    Types: Cigarettes    Quit date: 09/09/1965    Years since quitting: 53.8  . Smokeless tobacco: Never Used  Substance Use Topics  . Alcohol use: Yes    Comment: Glass of wine on social occasion or out to ear  . Drug use: No     Allergies   Bee venom   Review of Systems Review of Systems  Constitutional: Negative for chills and fever.  HENT: Negative for congestion and facial swelling.   Eyes: Negative for  discharge and visual disturbance.  Respiratory: Positive for shortness of breath.   Cardiovascular: Positive for chest pain. Negative for palpitations.  Gastrointestinal: Negative for abdominal pain, diarrhea and vomiting.  Musculoskeletal: Negative for arthralgias and myalgias.  Skin: Negative for color change and rash.  Neurological: Negative for tremors, syncope and headaches.  Psychiatric/Behavioral: Negative for confusion and dysphoric mood.     Physical Exam Updated Vital Signs BP 113/69 (BP Location: Right Arm)   Pulse 91   Temp 97.7 F (36.5 C)   Resp 18   Ht 6\' 2"  (1.88 m)   Wt 102.5 kg   SpO2 100%   BMI 29.02 kg/m   Physical Exam Vitals signs and nursing note reviewed.  Constitutional:      Appearance: He is well-developed.  HENT:     Head: Normocephalic and atraumatic.  Eyes:     Pupils: Pupils are equal, round, and reactive to light.  Neck:     Musculoskeletal: Normal range of motion and neck supple.     Vascular: No JVD.  Cardiovascular:     Rate and Rhythm: Normal rate and regular rhythm.     Heart sounds: No murmur. No friction rub. No gallop.   Pulmonary:     Effort: No respiratory distress.     Breath sounds: No wheezing.  Abdominal:     General: There is no distension.     Tenderness: There is no guarding or rebound.  Musculoskeletal: Normal range of motion.  Skin:    Coloration: Skin is not pale.     Findings: No rash.  Neurological:     Mental Status: He is alert and oriented to person, place, and time.  Psychiatric:        Behavior: Behavior normal.      ED Treatments / Results  Labs (all labs ordered  are listed, but only abnormal results are displayed) Labs Reviewed  BASIC METABOLIC PANEL - Abnormal; Notable for the following components:      Result Value   Glucose, Bld 192 (*)    All other components within normal limits  TROPONIN I (HIGH SENSITIVITY) - Abnormal; Notable for the following components:   Troponin I (High  Sensitivity) 754 (*)    All other components within normal limits  SARS CORONAVIRUS 2 (TAT 6-24 HRS)  CBC  HEPARIN LEVEL (UNFRACTIONATED)  TROPONIN I (HIGH SENSITIVITY)    EKG EKG Interpretation  Date/Time:  Monday July 16 2019 10:08:35 EST Ventricular Rate:  80 PR Interval:  228 QRS Duration: 88 QT Interval:  406 QTC Calculation: 468 R Axis:   87 Text Interpretation: Sinus rhythm with marked sinus arrhythmia with 1st degree A-V block with occasional Premature ventricular complexes Nonspecific ST and T wave abnormality Abnormal ECG st depression in v3 not seen on prior Confirmed by Deno Etienne [phone]) on 07/16/2019 11:10:38 AM   Radiology Dg Chest 2 View  Result Date: 07/16/2019 CLINICAL DATA:  Substernal chest pain improved with nitroglycerin. EXAM: CHEST - 2 VIEW COMPARISON:  08/24/2014 FINDINGS: Previous median sternotomy and CABG. Heart size is normal. Aortic atherosclerosis with tortuosity. Pulmonary vascularity is normal. The lungs are clear. No effusions. No acute bone finding. IMPRESSION: No active disease. Previous CABG. Aortic atherosclerosis. Electronically Signed   By: Nelson Chimes M.D.   On: 07/16/2019 10:35    Procedures Procedures (including critical care time)  Medications Ordered in ED Medications  heparin bolus via infusion 4,000 Units (has no administration in time range)  heparin ADULT infusion 100 units/mL (25000 units/248mL sodium chloride 0.45%) (has no administration in time range)  sodium chloride flush (NS) 0.9 % injection 3 mL (3 mLs Intravenous Given 07/16/19 1144)  aspirin chewable tablet 324 mg (324 mg Oral Given 07/16/19 1150)     Initial Impression / Assessment and Plan / ED Course  I have reviewed the triage vital signs and the nursing notes.  Pertinent labs & imaging results that were available during my care of the patient were reviewed by me and considered in my medical decision making (see chart for details).        83 yo M with a  chief complaint of chest pain shortness of breath.  Patient's symptoms sound typical and are concerning for ACS.  Initial troponin is 750.  Was started on a heparin drip give full dose aspirin.  Will discuss with cardiology for admission.  CRITICAL CARE Performed by: Cecilio Asper   Total critical care time: 35 minutes  Critical care time was exclusive of separately billable procedures and treating other patients.  Critical care was necessary to treat or prevent imminent or life-threatening deterioration.  Critical care was time spent personally by me on the following activities: development of treatment plan with patient and/or surrogate as well as nursing, discussions with consultants, evaluation of patient's response to treatment, examination of patient, obtaining history from patient or surrogate, ordering and performing treatments and interventions, ordering and review of laboratory studies, ordering and review of radiographic studies, pulse oximetry and re-evaluation of patient's condition.  The patients results and plan were reviewed and discussed.   Any x-rays performed were independently reviewed by myself.   Differential diagnosis were considered with the presenting HPI.  Medications  heparin bolus via infusion 4,000 Units (has no administration in time range)  heparin ADULT infusion 100 units/mL (25000 units/226mL sodium chloride 0.45%) (has  no administration in time range)  sodium chloride flush (NS) 0.9 % injection 3 mL (3 mLs Intravenous Given 07/16/19 1144)  aspirin chewable tablet 324 mg (324 mg Oral Given 07/16/19 1150)    Vitals:   07/16/19 0957 07/16/19 1145  BP:  113/69  Pulse: 91   Resp: 18   Temp: 97.7 F (36.5 C)   SpO2: 100%   Weight:  102.5 kg  Height:  6\' 2"  (1.88 m)    Final diagnoses:  NSTEMI (non-ST elevated myocardial infarction) [facility])    Admission/ observation were discussed with the admitting physician, patient and/or family and they are  comfortable with the plan.    Final Clinical Impressions(s) / ED Diagnoses   Final diagnoses:  NSTEMI (non-ST elevated myocardial infarction) [facility])    ED Discharge Orders    None       Deno Etienne, DO 07/16/19 1158

## 2019-07-16 NOTE — ED Notes (Signed)
ED TO INPATIENT HANDOFF REPORT  ED Nurse Name and Phone #: Lorrin Goodell [phone]  S Name/Age/Gender Jonathan [name] 83 y.o. male Room/Bed: 014C/014C  Code Status   Code Status: Prior  Home/SNF/Other Home Patient oriented to: self, place, time and situation Is this baseline? Yes   Triage Complete: Triage complete  Chief Complaint Chest Pain  Triage Note Pt reports chest pain while walking this am, took 1 sl nitro and pain resolved. States he also had some pain over the weekend that he took nitro for with relief, states he sees dr allred and is supposed to be worked up soon for possible pacemaker placement. Pt a/ox.    Allergies Allergies  Allergen Reactions  . Bee Venom Anaphylaxis    Level of Care/Admitting Diagnosis ED Disposition    ED Disposition Condition Comment   Admit  Hospital Area: [facility] [[id number]]  Level of Care: Telemetry Cardiac [103]  Covid Evaluation: Asymptomatic Screening Protocol (No Symptoms)  Diagnosis: NSTEMI (non-ST elevated myocardial infarction) [id number]  Admitting Physician: Burnell Blanks [[id number]]  Attending Physician: Lauree Chandler D [[id number]]  Estimated length of stay: past midnight tomorrow  Certification:: I certify this patient will need inpatient services for at least 2 midnights  PT Class (Do Not Modify): Inpatient [101]  PT Acc Code (Do Not Modify): Private [1]       B Medical/Surgery History Past Medical History:  Diagnosis Date  . Benign prostatic hypertrophy   . CAD (coronary artery disease)    CABG 2005 by Dr Cyndia Bent  . DJD (degenerative joint disease)    bilateral knee, gout  . GERD (gastroesophageal reflux disease)    HX OF  . Glomerulonephritis acute    at age 59, due to beta strep, resolved, renal calculi- passed spontaneously  . Gout   . Hepatitis A 1950  . Hepatitis B 1980's  . History of kidney stones    3-4 TIMES SINCE 1970 PASSED ON OWN  . HOH (hard of hearing)    wears  hearing aids  . Hyperlipidemia   . Hypertension   . Hypothyroidism   . Incontinence of urine   . Legally blind in right eye, as defined in [place]   . Personal history of other infectious and parasitic disease   . Sleep apnea 1990's   "before uvulectomy"  . Squamous cell carcinoma    "lots on my arms & face" (09/21/2013)  . Sun-damaged skin   . 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 Type II diabetes mellitus ([place])    Past Surgical History:  Procedure Laterality Date  . CARDIAC CATHETERIZATION    . CATARACT EXTRACTION W/ INTRAOCULAR LENS  IMPLANT, BILATERAL Bilateral   . CORONARY ARTERY BYPASS GRAFT  01/23/04   Dr Cyndia Bent  . CYSTOSCOPY WITH INSERTION OF UROLIFT N/A 06/23/2017   Procedure: CYSTOSCOPY WITH INSERTION OF UROLIFT;  Surgeon: Franchot Gallo, MD;  Location: [facility];  Service: Urology;  Laterality: N/A;  . EYE SURGERY Right    detached retina ,blind in right eye  . INGUINAL HERNIA REPAIR Bilateral 1980's - 1990's  . MASTOIDECTOMY Right 1943  . QUADRICEPS REPAIR Right 2009  . RETINAL DETACHMENT SURGERY Right X 2  . SEPSIS  1943  . TONSILLECTOMY  ~ 1943  . TOTAL KNEE ARTHROPLASTY Left 09/18/2013   Procedure: TOTAL KNEE ARTHROPLASTY;  Surgeon: Hessie Dibble, MD;  Location: [facility];  Service: Orthopedics;  Laterality: Left;  . TOTAL KNEE ARTHROPLASTY Right 08/22/2014   Procedure: RIGHT TOTAL KNEE ARTHROPLASTY;  Surgeon: Hessie Dibble, MD;  Location: [facility];  Service: Orthopedics;  Laterality: Right;  . UMBILICAL HERNIA REPAIR    . UVULOPALATOPLASTY  1990's   ENT MD DR Rock Nephew     A IV Location/Drains/Wounds Patient Lines/Drains/Airways Status   Active Line/Drains/Airways    Name:   Placement date:   Placement time:   Site:   Days:   Peripheral IV 07/16/19 Left Forearm   07/16/19    1143    Forearm   less than 1   Peripheral IV 07/16/19 Left Antecubital   07/16/19    1212    Antecubital   less than 1           Intake/Output Last 24 hours No intake or output data in the 24 hours ending 07/16/19 2036  Labs/Imaging Results for orders placed or performed during the hospital encounter of 07/16/19 (from the past 48 hour(s))  Basic metabolic panel     Status: Abnormal   Collection Time: 07/16/19 10:03 AM  Result Value Ref Range   Sodium 138 135 - 145 mmol/L   Potassium 4.3 3.5 - 5.1 mmol/L   Chloride 102 98 - 111 mmol/L   CO2 23 22 - 32 mmol/L   Glucose, Bld 192 (H) 70 - 99 mg/dL   BUN 19 8 - 23 mg/dL   Creatinine, Ser 1.11 0.61 - 1.24 mg/dL   Calcium 9.4 8.9 - 10.3 mg/dL   GFR calc non Af Amer >60 >60 mL/min   GFR calc Af Amer >60 >60 mL/min   Anion gap 13 5 - 15    Comment: Performed at [facility], [place] [address] [place] [address]  CBC     Status: None   Collection Time: 07/16/19 10:03 AM  Result Value Ref Range   WBC 6.3 4.0 - 10.5 K/uL   RBC 5.27 4.22 - 5.81 MIL/uL   Hemoglobin 16.6 13.0 - 17.0 g/dL   HCT 49.2 39.0 - 52.0 %   MCV 93.4 80.0 - 100.0 fL   MCH 31.5 26.0 - 34.0 pg   MCHC 33.7 30.0 - 36.0 g/dL   RDW 14.4 11.5 - 15.5 %   Platelets 184 150 - 400 K/uL   nRBC 0.0 0.0 - 0.2 %    Comment: Performed at [facility], [place] [address], [place], [place] [address]  Troponin I (High Sensitivity)     Status: Abnormal   Collection Time: 07/16/19 10:03 AM  Result Value Ref Range   Troponin I (High Sensitivity) 754 (HH) <18 ng/L    Comment: CRITICAL RESULT CALLED TO, READ BACK BY AND VERIFIED WITHCarola Rhine RN @ 1108 07/16/19 LEONARD,A (NOTE) Elevated high sensitivity troponin I (hsTnI) values and significant  changes across serial measurements may suggest ACS but many other  chronic and acute conditions are known to elevate hsTnI results.  Refer to the Links section for chest pain algorithms and additional  guidance. Performed at [facility], [place] [address] [place], [place] [address]   Troponin I (High Sensitivity)     Status: Abnormal    Collection Time: 07/16/19 11:44 AM  Result Value Ref Range   Troponin I (High Sensitivity) 761 (HH) <18 ng/L    Comment: CRITICAL VALUE NOTED.  VALUE IS CONSISTENT WITH PREVIOUSLY REPORTED AND CALLED VALUE. (NOTE) Elevated high sensitivity troponin I (hsTnI) values and significant  changes across  serial measurements may suggest ACS but many other  chronic and acute conditions are known to elevate hsTnI results.  Refer to the Links section for chest pain algorithms and additional  guidance. Performed at [facility], [place] [address] [place], [place] [address]   SARS CORONAVIRUS 2 (TAT 6-24 HRS) Nasopharyngeal Nasopharyngeal Swab     Status: None   Collection Time: 07/16/19 11:44 AM   Specimen: Nasopharyngeal Swab  Result Value Ref Range   SARS Coronavirus 2 NEGATIVE NEGATIVE    Comment: (NOTE) SARS-CoV-2 target nucleic acids are NOT DETECTED. The SARS-CoV-2 RNA is generally detectable in upper and lower respiratory specimens during the acute phase of infection. Negative results do not preclude SARS-CoV-2 infection, do not rule out co-infections with other pathogens, and should not be used as the sole basis for treatment or other patient management decisions. Negative results must be combined with clinical observations, patient history, and epidemiological information. The expected result is Negative. Fact Sheet for Patients: SugarRoll.be Fact Sheet for Healthcare Providers: https://www.woods-mathews.com/ This test is not yet approved or cleared by the [place] FDA and  has been authorized for detection and/or diagnosis of SARS-CoV-2 by FDA under an Emergency Use Authorization (EUA). This EUA will remain  in effect (meaning this test can be used) for the duration of the COVID-19 declaration under Section 56 4(b)(1) of the Act, 21 U.S.C. section 360bbb-3(b)(1), unless the authorization is terminated or revoked sooner. Performed  at [facility], [place] [address], [place], [place] [address]   CBG monitoring, ED     Status: Abnormal   Collection Time: 07/16/19  5:36 PM  Result Value Ref Range   Glucose-Capillary 67 (L) 70 - 99 mg/dL  CBG monitoring, ED     Status: Abnormal   Collection Time: 07/16/19  7:22 PM  Result Value Ref Range   Glucose-Capillary 151 (H) 70 - 99 mg/dL  Heparin level (unfractionated)     Status: None   Collection Time: 07/16/19  8:17 PM  Result Value Ref Range   Heparin Unfractionated 0.66 0.30 - 0.70 IU/mL    Comment: (NOTE) If heparin results are below expected values, and patient dosage has  been confirmed, suggest follow up testing of antithrombin III levels. Performed at [facility], [place] [address] [place], [place] [address]    Dg Chest 2 View  Result Date: 07/16/2019 CLINICAL DATA:  Substernal chest pain improved with nitroglycerin. EXAM: CHEST - 2 VIEW COMPARISON:  08/24/2014 FINDINGS: Previous median sternotomy and CABG. Heart size is normal. Aortic atherosclerosis with tortuosity. Pulmonary vascularity is normal. The lungs are clear. No effusions. No acute bone finding. IMPRESSION: No active disease. Previous CABG. Aortic atherosclerosis. Electronically Signed   By: Nelson Chimes M.D.   On: 07/16/2019 10:35    Pending Labs Unresulted Labs (From admission, onward)    Start     Ordered   07/17/19 0500  Heparin level (unfractionated)  Daily,   R     07/16/19 1151   07/17/19 0500  CBC  Daily,   R     07/16/19 1151   Signed and Held  Hemoglobin A1c  Add-on,   R     Signed and Held   Signed and Held  Basic metabolic panel  Tomorrow morning,   R     Signed and Held   Signed and Held  Lipid panel  Tomorrow morning,   R     Signed and Held   Signed and Held  CBC  Tomorrow morning,   R     Signed and Held          Vitals/Pain Today's Vitals   07/16/19 1730 07/16/19 1800 07/16/19 1830 07/16/19 2030  BP: (!) 113/54 123/74 (!) 110/54 129/66  Pulse: (!) 44 (!) 54  63 (!) 44  Resp: 20 (!) 21 20 (!) 22  Temp:      SpO2: 93% 92% 93% 93%  Weight:      Height:      PainSc:        Isolation Precautions No active isolations  Medications Medications  heparin ADULT infusion 100 units/mL (25000 units/234mL sodium chloride 0.45%) (1,350 Units/hr Intravenous New Bag/Given 07/16/19 1214)  insulin aspart (novoLOG) injection 0-15 Units (0 Units Subcutaneous Not Given 07/16/19 1739)  sodium chloride flush (NS) 0.9 % injection 3 mL (has no administration in time range)  sodium chloride flush (NS) 0.9 % injection 3 mL (3 mLs Intravenous Given 07/16/19 1144)  aspirin chewable tablet 324 mg (324 mg Oral Given 07/16/19 1150)  heparin bolus via infusion 4,000 Units (4,000 Units Intravenous Bolus from Bag 07/16/19 1218)    Mobility walks Low fall risk   Focused Assessments Cardiac Assessment Handoff:  Cardiac Rhythm: Atrial fibrillation No results found for: CKTOTAL, CKMB, CKMBINDEX, TROPONINI No results found for: DDIMER Does the Patient currently have chest pain? No      R Recommendations: See Admitting Provider Note  Report given to:   Additional Notes:

## 2019-07-16 NOTE — ED Notes (Addendum)
Pt given [place] bag to eat and 2 juices to drink

## 2019-07-17 ENCOUNTER — Encounter (HOSPITAL_COMMUNITY): Payer: Self-pay | Admitting: General Practice

## 2019-07-17 ENCOUNTER — Encounter (HOSPITAL_COMMUNITY)
Admission: EM | Disposition: A | Discharge status: Home / Self Care | Payer: Self-pay | Source: Home / Self Care | Attending: Cardiovascular Disease

## 2019-07-17 ENCOUNTER — Other Ambulatory Visit (HOSPITAL_COMMUNITY): Payer: Medicare Other

## 2019-07-17 DIAGNOSIS — I25810 Atherosclerosis of coronary artery bypass graft(s) without angina pectoris: Secondary | ICD-10-CM

## 2019-07-17 DIAGNOSIS — I214 Non-ST elevation (NSTEMI) myocardial infarction: Secondary | ICD-10-CM | POA: Diagnosis not present

## 2019-07-17 HISTORY — PX: LEFT HEART CATH AND CORONARY ANGIOGRAPHY: CATH118249

## 2019-07-17 HISTORY — PX: CORONARY STENT INTERVENTION: CATH118234

## 2019-07-17 LAB — BASIC METABOLIC PANEL
Anion gap: 10 (ref 5–15)
BUN: 17 mg/dL (ref 8–23)
CO2: 25 mmol/L (ref 22–32)
Calcium: 9 mg/dL (ref 8.9–10.3)
Chloride: 103 mmol/L (ref 98–111)
Creatinine, Ser: 1.04 mg/dL (ref 0.61–1.24)
GFR calc Af Amer: >60 mL/min (ref >60)
GFR calc non Af Amer: >60 mL/min (ref >60)
Glucose, Bld: 222 mg/dL — ABNORMAL HIGH (ref 70–99)
Potassium: 3.7 mmol/L (ref 3.5–5.1)
Sodium: 138 mmol/L (ref 135–145)

## 2019-07-17 LAB — CBC
HCT: 43.4 % (ref 39.0–52.0)
Hemoglobin: 14.8 g/dL (ref 13.0–17.0)
MCH: 31.7 pg (ref 26.0–34.0)
MCHC: 34.1 g/dL (ref 30.0–36.0)
MCV: 92.9 fL (ref 80.0–100.0)
Platelets: 162 10*3/uL (ref 150–400)
RBC: 4.67 MIL/uL (ref 4.22–5.81)
RDW: 14.5 % (ref 11.5–15.5)
WBC: 4.2 10*3/uL (ref 4.0–10.5)
nRBC: 0 % (ref 0.0–0.2)

## 2019-07-17 LAB — POCT ACTIVATED CLOTTING TIME
Activated Clotting Time: 241 seconds
Activated Clotting Time: 285 seconds

## 2019-07-17 LAB — LIPID PANEL
Cholesterol: 130 mg/dL (ref 0–200)
HDL: 51 mg/dL (ref >40)
LDL Cholesterol: 55 mg/dL (ref 0–99)
Total CHOL/HDL Ratio: 2.5 RATIO
Triglycerides: 120 mg/dL (ref <150)
VLDL: 24 mg/dL (ref 0–40)

## 2019-07-17 LAB — HEMOGLOBIN A1C
Hgb A1c MFr Bld: 6.7 % — ABNORMAL HIGH (ref 4.8–5.6)
Mean Plasma Glucose: 145.59 mg/dL

## 2019-07-17 LAB — GLUCOSE, CAPILLARY
Glucose-Capillary: 126 mg/dL — ABNORMAL HIGH (ref 70–99)
Glucose-Capillary: 190 mg/dL — ABNORMAL HIGH (ref 70–99)

## 2019-07-17 LAB — SURGICAL PCR SCREEN
MRSA, PCR: NEGATIVE
Staphylococcus aureus: POSITIVE — AB

## 2019-07-17 LAB — HEPARIN LEVEL (UNFRACTIONATED): Heparin Unfractionated: 0.51 IU/mL (ref 0.30–0.70)

## 2019-07-17 SURGERY — LEFT HEART CATH AND CORONARY ANGIOGRAPHY
Anesthesia: LOCAL

## 2019-07-17 MED ORDER — HEPARIN (PORCINE) IN NACL 1000-0.9 UT/500ML-% IV SOLN
INTRAVENOUS | Status: AC
  Filled 2019-07-17: qty 1000

## 2019-07-17 MED ORDER — ASPIRIN EC 81 MG PO TBEC
81.0000 mg | DELAYED_RELEASE_TABLET | Freq: Every day | ORAL | Status: DC
  Filled 2019-07-17: qty 1

## 2019-07-17 MED ORDER — HEPARIN SODIUM (PORCINE) 1000 UNIT/ML IJ SOLN
INTRAMUSCULAR | Status: DC | PRN
  Administered 2019-07-17: 3000 [IU] via INTRAVENOUS
  Administered 2019-07-17 (×2): 5000 [IU] via INTRAVENOUS

## 2019-07-17 MED ORDER — SODIUM CHLORIDE 0.9% FLUSH: 3.0000 mL | INTRAVENOUS | Status: DC | PRN

## 2019-07-17 MED ORDER — NITROGLYCERIN 0.4 MG SL SUBL: 0.4000 mg | SUBLINGUAL_TABLET | SUBLINGUAL | Status: DC | PRN

## 2019-07-17 MED ORDER — TICAGRELOR 90 MG PO TABS
ORAL_TABLET | ORAL | Status: AC
  Filled 2019-07-17: qty 2

## 2019-07-17 MED ORDER — ENOXAPARIN SODIUM 40 MG/0.4ML ~~LOC~~ SOLN
40.0000 mg | SUBCUTANEOUS | Status: DC
  Administered 2019-07-18: 40 mg via SUBCUTANEOUS
  Filled 2019-07-17: qty 0.4

## 2019-07-17 MED ORDER — FENTANYL CITRATE (PF) 100 MCG/2ML IJ SOLN
INTRAMUSCULAR | Status: AC
  Filled 2019-07-17: qty 2

## 2019-07-17 MED ORDER — ACETAMINOPHEN 325 MG PO TABS: 650.0000 mg | ORAL_TABLET | ORAL | Status: DC | PRN

## 2019-07-17 MED ORDER — ASPIRIN EC 81 MG PO TBEC: 81.0000 mg | DELAYED_RELEASE_TABLET | Freq: Every day | ORAL | Status: DC

## 2019-07-17 MED ORDER — HEPARIN SODIUM (PORCINE) 1000 UNIT/ML IJ SOLN
INTRAMUSCULAR | Status: AC
  Filled 2019-07-17: qty 1

## 2019-07-17 MED ORDER — SODIUM CHLORIDE 0.9 % IV SOLN: 250.0000 mL | INTRAVENOUS | Status: DC | PRN

## 2019-07-17 MED ORDER — LORAZEPAM 0.5 MG PO TABS
0.5000 mg | ORAL_TABLET | Freq: Once | ORAL | Status: AC
  Administered 2019-07-17: 0.5 mg via ORAL
  Filled 2019-07-17: qty 1

## 2019-07-17 MED ORDER — ASPIRIN 81 MG PO CHEW
81.0000 mg | CHEWABLE_TABLET | Freq: Every day | ORAL | Status: DC
  Administered 2019-07-18: 10:00:00 81 mg via ORAL
  Filled 2019-07-17: qty 1

## 2019-07-17 MED ORDER — IOHEXOL 350 MG/ML SOLN
INTRAVENOUS | Status: DC | PRN
  Administered 2019-07-17: 200 mL

## 2019-07-17 MED ORDER — ASPIRIN 81 MG PO CHEW: 324.0000 mg | CHEWABLE_TABLET | ORAL | Status: DC

## 2019-07-17 MED ORDER — LIDOCAINE HCL (PF) 1 % IJ SOLN
INTRAMUSCULAR | Status: DC | PRN
  Administered 2019-07-17: 2 mL

## 2019-07-17 MED ORDER — SODIUM CHLORIDE 0.9% FLUSH
3.0000 mL | Freq: Two times a day (BID) | INTRAVENOUS | Status: DC
  Administered 2019-07-18: 3 mL via INTRAVENOUS

## 2019-07-17 MED ORDER — HEPARIN (PORCINE) IN NACL 1000-0.9 UT/500ML-% IV SOLN
INTRAVENOUS | Status: DC | PRN
  Administered 2019-07-17 (×2): 500 mL

## 2019-07-17 MED ORDER — SODIUM CHLORIDE 0.9 % WEIGHT BASED INFUSION: 1.0000 mL/kg/h | INTRAVENOUS | Status: AC

## 2019-07-17 MED ORDER — LIDOCAINE HCL (PF) 1 % IJ SOLN
INTRAMUSCULAR | Status: AC
  Filled 2019-07-17: qty 30

## 2019-07-17 MED ORDER — ASPIRIN 300 MG RE SUPP: 300.0000 mg | RECTAL | Status: DC

## 2019-07-17 MED ORDER — TICAGRELOR 90 MG PO TABS
ORAL_TABLET | ORAL | Status: DC | PRN
  Administered 2019-07-17: 180 mg via ORAL

## 2019-07-17 MED ORDER — MIDAZOLAM HCL 2 MG/2ML IJ SOLN
INTRAMUSCULAR | Status: AC
  Filled 2019-07-17: qty 2

## 2019-07-17 MED ORDER — MIDAZOLAM HCL 2 MG/2ML IJ SOLN
INTRAMUSCULAR | Status: DC | PRN
  Administered 2019-07-17: 2 mg via INTRAVENOUS

## 2019-07-17 MED ORDER — VERAPAMIL HCL 2.5 MG/ML IV SOLN
INTRAVENOUS | Status: AC
  Filled 2019-07-17: qty 2

## 2019-07-17 MED ORDER — SIMVASTATIN 20 MG PO TABS
40.0000 mg | ORAL_TABLET | Freq: Every day | ORAL | Status: DC
  Administered 2019-07-17: 40 mg via ORAL
  Filled 2019-07-17: qty 2

## 2019-07-17 MED ORDER — TICAGRELOR 90 MG PO TABS
90.0000 mg | ORAL_TABLET | Freq: Two times a day (BID) | ORAL | Status: DC
  Administered 2019-07-17 – 2019-07-18 (×2): 90 mg via ORAL
  Filled 2019-07-17 (×2): qty 1

## 2019-07-17 MED ORDER — LEVOTHYROXINE SODIUM 100 MCG PO TABS
100.0000 ug | ORAL_TABLET | Freq: Every day | ORAL | Status: DC
  Administered 2019-07-18: 100 ug via ORAL
  Filled 2019-07-17: qty 1

## 2019-07-17 MED ORDER — SODIUM CHLORIDE 0.9 % WEIGHT BASED INFUSION
3.0000 mL/kg/h | INTRAVENOUS | Status: DC
  Administered 2019-07-17: 3 mL/kg/h via INTRAVENOUS

## 2019-07-17 MED ORDER — NITROGLYCERIN 1 MG/10 ML FOR IR/CATH LAB
INTRA_ARTERIAL | Status: DC | PRN
  Administered 2019-07-17 (×3): 200 ug via INTRACORONARY

## 2019-07-17 MED ORDER — ASPIRIN 81 MG PO CHEW: 81.0000 mg | CHEWABLE_TABLET | ORAL | Status: DC

## 2019-07-17 MED ORDER — VERAPAMIL HCL 2.5 MG/ML IV SOLN
INTRAVENOUS | Status: DC | PRN
  Administered 2019-07-17: 10 mL via INTRA_ARTERIAL

## 2019-07-17 MED ORDER — NITROGLYCERIN 1 MG/10 ML FOR IR/CATH LAB
INTRA_ARTERIAL | Status: AC
  Filled 2019-07-17: qty 10

## 2019-07-17 MED ORDER — SODIUM CHLORIDE 0.9 % WEIGHT BASED INFUSION
1.0000 mL/kg/h | INTRAVENOUS | Status: DC
  Administered 2019-07-17 (×2): 1 mL/kg/h via INTRAVENOUS

## 2019-07-17 MED ORDER — ONDANSETRON HCL 4 MG/2ML IJ SOLN: 4.0000 mg | Freq: Four times a day (QID) | INTRAMUSCULAR | Status: DC | PRN

## 2019-07-17 MED ORDER — ASPIRIN 81 MG PO CHEW
81.0000 mg | CHEWABLE_TABLET | ORAL | Status: AC
  Administered 2019-07-17: 81 mg via ORAL

## 2019-07-17 MED ORDER — FENTANYL CITRATE (PF) 100 MCG/2ML IJ SOLN
INTRAMUSCULAR | Status: DC | PRN
  Administered 2019-07-17: 25 ug via INTRAVENOUS

## 2019-07-17 SURGICAL SUPPLY — 23 items
BALLN  ~~LOC~~ SAPPHIRE 4.5X12 (BALLOONS) ×1
BALLN SAPPHIRE 2.0X15 (BALLOONS) ×2
BALLN ~~LOC~~ SAPPHIRE 4.5X12 (BALLOONS) ×1
BALLOON SAPPHIRE 2.0X15 (BALLOONS) ×1 IMPLANT
BALLOON ~~LOC~~ SAPPHIRE 4.5X12 (BALLOONS) ×1 IMPLANT
CATH DXT MULTI JL4 JR4 ANG PIG (CATHETERS) ×2 IMPLANT
CATH INFINITI 5 FR IM (CATHETERS) ×2 IMPLANT
CATH INFINITI 5 FR MPA2 (CATHETERS) ×2 IMPLANT
CATH INFINITI 5FR AL1 (CATHETERS) ×2 IMPLANT
CATH LAUNCHER 6FR AL2 (CATHETERS) ×1 IMPLANT
CATHETER LAUNCHER 6FR AL2 (CATHETERS) ×2
DEVICE RAD COMP TR BAND LRG (VASCULAR PRODUCTS) ×2 IMPLANT
GLIDESHEATH SLEND SS 6F .021 (SHEATH) ×2 IMPLANT
GUIDEWIRE INQWIRE 1.5J.035X260 (WIRE) ×1 IMPLANT
INQWIRE 1.5J .035X260CM (WIRE) ×2
KIT ENCORE 26 ADVANTAGE (KITS) ×2 IMPLANT
KIT HEART LEFT (KITS) ×2 IMPLANT
PACK CARDIAC CATHETERIZATION (CUSTOM PROCEDURE TRAY) ×2 IMPLANT
STENT RESOLUTE ONYX 3.5X15 (Permanent Stent) ×2 IMPLANT
STENT RESOLUTE ONYX 4.0X22 (Permanent Stent) ×2 IMPLANT
TRANSDUCER W/STOPCOCK (MISCELLANEOUS) ×2 IMPLANT
TUBING CIL FLEX 10 FLL-RA (TUBING) ×2 IMPLANT
WIRE ASAHI PROWATER 180CM (WIRE) ×4 IMPLANT

## 2019-07-17 NOTE — Progress Notes (Signed)
Blood noted at cath site(under TR band) Per cath Lab leave TR band for 1 hour if no other bleeding remove TR band.  Patient placed on telemetry.

## 2019-07-17 NOTE — Progress Notes (Signed)
ANTICOAGULATION CONSULT NOTE  Pharmacy Consult for heparin Indication: chest pain/ACS  Heparin Dosing Weight: 102.5 kg  Labs: Recent Labs    07/16/19 1003 07/16/19 1144 07/16/19 2017 07/17/19 0146  HGB 16.6  --   --  14.8  HCT 49.2  --   --  43.4  PLT 184  --   --  162  HEPARINUNFRC  --   --  0.66 0.51  CREATININE 1.11  --   --  1.04  TROPONINIHS 754* 761*  --   --     Estimated Creatinine Clearance: 71.1 mL/min (by C-G formula based on SCr of 1.04 mg/dL).  Assessment: 83yom presenting with CP, elevated high-sensitivity troponin. Pharmacy consulted to dose heparin.   Heparin level this morning is within goal range. No bleeding noted at this time.   Now s/p cath procedure w/ DES x 2.  Goal of Therapy:  Heparin level 0.3-0.7 units/ml Monitor platelets by anticoagulation protocol: Yes   Plan:  No further indication for heparin, pharmacy will sign-off.  Please contact if questions.  Marguerite Olea, [facility] Clinical Pharmacist Phone [phone]  07/17/2019 2:54 PM

## 2019-07-17 NOTE — Interval H&P Note (Signed)
History and Physical Interval Note:  07/17/2019 10:08 AM  Jonathan [name]  has presented today for surgery, with the diagnosis of nstemi.  The various methods of treatment have been discussed with the patient and family. After consideration of risks, benefits and other options for treatment, the patient has consented to  Procedure(s): LEFT HEART CATH AND CORONARY ANGIOGRAPHY (N/A) as a surgical intervention.  The patient's history has been reviewed, patient examined, no change in status, stable for surgery.  I have reviewed the patient's chart and labs.  Questions were answered to the patient's satisfaction.    Cath Lab Visit (complete for each Cath Lab visit)  Clinical Evaluation Leading to the Procedure:   ACS: Yes.    Non-ACS:    Anginal Classification: CCS IV  Anti-ischemic medical therapy: No Therapy  Non-Invasive Test Results: No non-invasive testing performed  Prior CABG: Previous CABG       [name] [facility] 07/17/2019 10:08 AM'

## 2019-07-18 ENCOUNTER — Encounter (HOSPITAL_COMMUNITY): Payer: Self-pay | Admitting: Cardiology

## 2019-07-18 ENCOUNTER — Inpatient Hospital Stay (HOSPITAL_COMMUNITY): Payer: Medicare Other

## 2019-07-18 DIAGNOSIS — I361 Nonrheumatic tricuspid (valve) insufficiency: Secondary | ICD-10-CM | POA: Diagnosis not present

## 2019-07-18 DIAGNOSIS — I340 Nonrheumatic mitral (valve) insufficiency: Secondary | ICD-10-CM | POA: Diagnosis not present

## 2019-07-18 LAB — BASIC METABOLIC PANEL
Anion gap: 11 (ref 5–15)
BUN: 11 mg/dL (ref 8–23)
CO2: 23 mmol/L (ref 22–32)
Calcium: 8.8 mg/dL — ABNORMAL LOW (ref 8.9–10.3)
Chloride: 105 mmol/L (ref 98–111)
Creatinine, Ser: 0.91 mg/dL (ref 0.61–1.24)
GFR calc Af Amer: >60 mL/min (ref >60)
GFR calc non Af Amer: >60 mL/min (ref >60)
Glucose, Bld: 109 mg/dL — ABNORMAL HIGH (ref 70–99)
Potassium: 3.8 mmol/L (ref 3.5–5.1)
Sodium: 139 mmol/L (ref 135–145)

## 2019-07-18 LAB — CBC
HCT: 41.1 % (ref 39.0–52.0)
Hemoglobin: 14.2 g/dL (ref 13.0–17.0)
MCH: 32 pg (ref 26.0–34.0)
MCHC: 34.5 g/dL (ref 30.0–36.0)
MCV: 92.6 fL (ref 80.0–100.0)
Platelets: 154 10*3/uL (ref 150–400)
RBC: 4.44 MIL/uL (ref 4.22–5.81)
RDW: 14.4 % (ref 11.5–15.5)
WBC: 4.9 10*3/uL (ref 4.0–10.5)
nRBC: 0 % (ref 0.0–0.2)

## 2019-07-18 LAB — GLUCOSE, CAPILLARY
Glucose-Capillary: 220 mg/dL — ABNORMAL HIGH (ref 70–99)
Glucose-Capillary: 186 mg/dL — ABNORMAL HIGH (ref 70–99)
Glucose-Capillary: 136 mg/dL — ABNORMAL HIGH (ref 70–99)
Glucose-Capillary: 178 mg/dL — ABNORMAL HIGH (ref 70–99)

## 2019-07-18 LAB — ECHOCARDIOGRAM COMPLETE
Height: 74 in
Weight: 3704 oz

## 2019-07-18 MED ORDER — NITROGLYCERIN 0.4 MG SL SUBL: 0.4000 mg | SUBLINGUAL_TABLET | SUBLINGUAL | 2 refills | Status: AC | PRN

## 2019-07-18 MED ORDER — TICAGRELOR 90 MG PO TABS: 90.0000 mg | ORAL_TABLET | Freq: Two times a day (BID) | ORAL | 2 refills | Status: DC

## 2019-07-18 MED FILL — NITROGLYCERIN 0.4 MG TAB SL: 0.4 | 8 days supply | Qty: 25 | Fill #0

## 2019-07-18 MED FILL — BRILINTA 90 MG TABLET: 90 | 30 days supply | Qty: 60 | Fill #0

## 2019-07-18 NOTE — Plan of Care (Signed)
  Problem: Cardiac: Goal: Ability to achieve and maintain adequate cardiovascular perfusion will improve Outcome: Progressing   Problem: Health Behavior/Discharge Planning: Goal: Ability to safely manage health-related needs after discharge will improve Outcome: Progressing   Problem: Education: Goal: Knowledge of General Education information will improve Description: Including pain rating scale, medication(s)/side effects and non-pharmacologic comfort measures Outcome: Progressing

## 2019-07-18 NOTE — Progress Notes (Signed)
CARDIAC REHAB PHASE I   PRE:  Rate/Rhythm: 68 ?wenckebach  BP:  Supine:   Sitting: 180/98 arm Standing:    SaO2: 97%RA  MODE:  Ambulation: 700 ft   POST:  Rate/Rhythm: 88  BP:  Supine:   Sitting: 194/111wrist, 191/86 arm  Standing:    SaO2: 97%RA [id number] Waited for pt to finish breakfast and then pt seen. Pt will elevated BP. Took in upper and lower arm. Pt walked 700 ft with steady gait and no CP. BP remained elevated after walk. Education completed with pt who voiced understanding. Reviewed NTG use, importance of brilinta with stents, gave diabetic and heart healthy diets and walking for ex. Pt attended CRP 2 [place] in 2005. Referred to [facility] now. Did not review MI ed as pt stated negative for MI. Notified RN of elevated BP.   Graylon Good, RN BSN  07/18/2019 9:29 AM

## 2019-07-18 NOTE — Discharge Instructions (Addendum)
Radial Site Care ° °This sheet gives you information about how to care for yourself after your procedure. Your health care provider may also give you more specific instructions. If you have problems or questions, contact your health care provider. °What can I expect after the procedure? °After the procedure, it is common to have: °· Bruising and tenderness at the catheter insertion area. °Follow these instructions at home: °Medicines °· Take over-the-counter and prescription medicines only as told by your health care provider. °Insertion site care °· Follow instructions from your health care provider about how to take care of your insertion site. Make sure you: °? Wash your hands with soap and water before you change your bandage (dressing). If soap and water are not available, use hand sanitizer. °? Change your dressing as told by your health care provider. °? Leave stitches (sutures), skin glue, or adhesive strips in place. These skin closures may need to stay in place for 2 weeks or longer. If adhesive strip edges start to loosen and curl up, you may trim the loose edges. Do not remove adhesive strips completely unless your health care provider tells you to do that. °· Check your insertion site every day for signs of infection. Check for: °? Redness, swelling, or pain. °? Fluid or blood. °? Pus or a bad smell. °? Warmth. °· Do not take baths, swim, or use a hot tub until your health care provider approves. °· You may shower 24-48 hours after the procedure, or as directed by your health care provider. °? Remove the dressing and gently wash the site with plain soap and water. °? Pat the area dry with a clean towel. °? Do not rub the site. That could cause bleeding. °· Do not apply powder or lotion to the site. °Activity ° °· For 24 hours after the procedure, or as directed by your health care provider: °? Do not flex or bend the affected arm. °? Do not push or pull heavy objects with the affected arm. °? Do not  drive yourself home from the hospital or clinic. You may drive 24 hours after the procedure unless your health care provider tells you not to. °? Do not operate machinery or power tools. °· Do not lift anything that is heavier than 10 lb (4.5 kg), or the limit that you are told, until your health care provider says that it is safe. °· Ask your health care provider when it is okay to: °? Return to work or school. °? Resume usual physical activities or sports. °? Resume sexual activity. °General instructions °· If the catheter site starts to bleed, raise your arm and put firm pressure on the site. If the bleeding does not stop, get help right away. This is a medical emergency. °· If you went home on the same day as your procedure, a responsible adult should be with you for the first 24 hours after you arrive home. °· Keep all follow-up visits as told by your health care provider. This is important. °Contact a health care provider if: °· You have a fever. °· You have redness, swelling, or yellow drainage around your insertion site. °Get help right away if: °· You have unusual pain at the radial site. °· The catheter insertion area swells very fast. °· The insertion area is bleeding, and the bleeding does not stop when you hold steady pressure on the area. °· Your arm or hand becomes pale, cool, tingly, or numb. °These symptoms may represent a serious problem   that is an emergency. Do not wait to see if the symptoms will go away. Get medical help right away. Call your local emergency services (911 in the [place] Do not drive yourself to the hospital. Summary  After the procedure, it is common to have bruising and tenderness at the site.  Follow instructions from your health care provider about how to take care of your radial site wound. Check the wound every day for signs of infection.  Do not lift anything that is heavier than 10 lb (4.5 kg), or the limit that you are told, until your health care provider says  that it is safe. This information is not intended to replace advice given to you by your health care provider. Make sure you discuss any questions you have with your health care provider. Document Released: 08/28/2010 Document Revised: 08/31/2017 Document Reviewed: 08/31/2017 Elsevier Patient Education  2020 [place] about your medication: Brilinta (anti-platelet agent)  Generic Name (Brand): ticagrelor (Brilinta), twice daily medication  PURPOSE: You are taking this medication along with aspirin to lower your chance of having a heart attack, stroke, or blood clots in your heart stent. These can be fatal. Brilinta and aspirin help prevent platelets from sticking together and forming a clot that can block an artery or your stent.   Common SIDE EFFECTS you may experience include: bruising or bleeding more easily, shortness of breath  Do not stop taking BRILINTA without talking to the doctor who prescribes it for you. People who are treated with a stent and stop taking Brilinta too soon, have a higher risk of getting a blood clot in the stent, having a heart attack, or dying. If you stop Brilinta because of bleeding, or for other reasons, your risk of a heart attack or stroke may increase.   Tell all of your doctors and dentists that you are taking Brilinta. They should talk to the doctor who prescribed Brilinta for you before you have any surgery or invasive procedure.   Contact your health care provider if you experience: severe or uncontrollable bleeding, pink/red/brown urine, vomiting blood or vomit that looks like "coffee grounds", red or black stools (looks like tar), coughing up blood or blood clots ----------------------------------------------------------------------------------------------------------------------

## 2019-07-18 NOTE — Care Management (Signed)
07-18-19 0941 Benefits Check submitted for Jardiance and Brilinta. CM will make patient aware of cost once completed. No further needs from CM at this time. Bethena Roys, RN,BSN Case Manager [phone]

## 2019-07-18 NOTE — Plan of Care (Signed)
  Problem: Activity: Goal: Ability to tolerate increased activity will improve Outcome: Progressing   Problem: Cardiac: Goal: Ability to achieve and maintain adequate cardiovascular perfusion will improve Outcome: Progressing   Problem: Health Behavior/Discharge Planning: Goal: Ability to safely manage health-related needs after discharge will improve Outcome: Progressing   Problem: Education: Goal: Knowledge of General Education information will improve Description: Including pain rating scale, medication(s)/side effects and non-pharmacologic comfort measures Outcome: Progressing   Problem: Health Behavior/Discharge Planning: Goal: Ability to manage health-related needs will improve Outcome: Progressing   Problem: Coping: Goal: Level of anxiety will decrease Outcome: Progressing

## 2019-07-18 NOTE — Care Management (Signed)
Per [employer] Ph# [phone].# [phone]  Co-pay amount for Jardiance 52m.twice a day : $42.00 for 30 day supply. [employer]   mail order for a 30 day supply $42.00, 90 day supply $297.56.  Co-pay amount for Brilinta 926mbid for 30 day supply $42.00. [employer] mail order for 30 day supply $42.00,90 day supply $195.67.  No PA required Deductible not met. Tier 3 medication Retail Pharmacies are: [facility]

## 2019-07-18 NOTE — Progress Notes (Signed)
  Echocardiogram 2D Echocardiogram has been performed.  Jonathan [name] [name] 07/18/2019, 10:46 AM

## 2019-07-18 NOTE — Discharge Summary (Signed)
Discharge Summary    Patient ID: SHMAR FOCHS,  MRN: [id number], DOB/AGE: Jun 17, 1936 83 y.o.  Admit date: 07/16/2019 Discharge date: 07/18/2019  Primary Care Provider: Stevie Kern A (Inactive) Primary Cardiologist: No primary care provider on file.  Discharge Diagnoses    Principal Problem:   NSTEMI (non-ST elevated myocardial infarction) [facility]) Active Problems:   Hypothyroidism   Type 2 diabetes mellitus with complication (HCC)   Hyperlipidemia   Essential hypertension   Allergies Allergies  Allergen Reactions  . Bee Venom Anaphylaxis    Diagnostic Studies/Procedures    Cath: 07/17/19   Ost LAD to Prox LAD lesion is 100% stenosed.  Ost Cx to Prox Cx lesion is 100% stenosed.  Ost LM to Ost LAD lesion is 100% stenosed.  Ost RCA to Prox RCA lesion is 100% stenosed.  LIMA graft was visualized by angiography and is normal in caliber.  The graft exhibits no disease.  SVG graft was visualized by angiography and is normal in caliber.  The graft exhibits no disease.  Seq SVG- OM2 and OM3 graft was visualized by angiography.  SVG graft was visualized by angiography and is normal in caliber.  The graft exhibits no disease.  Origin to Prox Graft lesion before 2nd Mrg is 99% stenosed.  A drug-eluting stent was successfully placed using a STENT RESOLUTE ONYX 3.5X15.  Post intervention, there is a 0% residual stenosis.  Origin to Mid Graft lesion between 2nd Mrg and 3rd Mrg is 99% stenosed.  A drug-eluting stent was successfully placed using a STENT RESOLUTE ONYX 4.0X22.  Post intervention, there is a 0% residual stenosis.  LV end diastolic pressure is normal.   1. Occlusion of the native coronary arteries 2. Patent LIMA to the LAD 3. Patent SVG to the diagonal 4. Sequential SVG to the OM2 and OM3 with critical stenosis at the origin of the graft and after the anastomosis to OM2 in the sequential graft. 5. Patent SVG to the RCA 6. Normal LVEDP 7.  Successful PCI of the SVG to OM2 and OM3 with DES x 2.  Plan: DAPT for one year. Assess LV function by Echo. Anticipate DC in am.   Diagnostic Dominance: Right  Intervention   TTE: 07/18/19  IMPRESSIONS    1. Left ventricular ejection fraction, by visual estimation, is 55 to 60%. The left ventricle has normal function. There is no left ventricular hypertrophy.  2. Left ventricular diastolic parameters are consistent with Grade I diastolic dysfunction (impaired relaxation).  3. The left ventricle has no regional wall motion abnormalities.  4. Global right ventricle has normal systolic function.The right ventricular size is mildly enlarged. No increase in right ventricular wall thickness.  5. There is moderate dilatation of the ascending aorta measuring 42 mm.  6. Left atrial size was normal.  7. Right atrial size was normal.  8. Trivial pericardial effusion is present.  9. The mitral valve is degenerative. Mild mitral valve regurgitation. 10. The tricuspid valve is grossly normal. Tricuspid valve regurgitation is mild. 11. The aortic valve is tricuspid. Aortic valve regurgitation is mild. No evidence of aortic valve sclerosis or stenosis. 12. Pulmonic regurgitation is mild. 13. The pulmonic valve was grossly normal. Pulmonic valve regurgitation is mild. 14. Normal pulmonary artery systolic pressure. 15. The tricuspid regurgitant velocity is 2.24 m/s, and with an assumed right atrial pressure of 3 mmHg, the estimated right ventricular systolic pressure is normal at 23.1 mmHg. 16. The inferior vena cava is normal in size with greater  than 50% respiratory variability, suggesting right atrial pressure of 3 mmHg. _____________   History of Present Illness     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 Rayann Heman above who presented to [facility] 07/16/2019 with a 3-day history of chest pain and SOB  which became more noticeable while walking earlier today. Pt reported that he was in his usual state of health prior to the Thursday prior to admission at which time he stated he was walking on his typical 2-3 mile walk and he began having DOE which was unusual for him. He stated that he went home and took 2 SL NTG tablets with complete relief. He then called the office at which time he was scheduled for an in-office follow up visit. He was doing well over the weekend however had a similar episode earlier today while walking again. Given this, he called the office once again and was prompted to come to the ED.    In the ED, EKG showed NSR with 2ndegree AV block and PVC's with mild ST depression V3 otherwise no significant change from previous tracing.  HsT was found to be elevated at 754>>>761. He was started on IV Hep gtt and given ASA out of concern for ACS. CXR with no acute findings. Covid screen was negative. He denied LE edema however stated that he did take his PRN Lasix to see if this would help. He denies dizziness, orthopnea, fever, chills, N/V, loss of taste or smell. No pre-syncope or syncopal episodes.   He was last seen by Dr. Rayann Heman via telemedicine visit for follow up on 01/15/2019 and was doing well from a cardiac perspective. Given the telephone visit, he was then sent to the office for routine EKG and was found to have worsening 1st degree AV>>to 2nd degree Mobitz Type I AV block with recommendations to call the office if any new or suspicious symptoms such as dizziness, presyncope, fatigue, SOB or any other changes should arise. Given no symptoms of AV block at that time, plans were to monitor with low threshold for PPM placement.   Hospital Course     He was admitted and underwent cardiac cath noted above with patent LIMA-LAD, SVG-Diag, SVG to RCA and new lesions in the SVG to OM2, OM3. Successful PCI/ DES x2. Placed on DAPT with ASA/Brilinta for at least one year. Follow up echo  showed EF of 55-60% with no LVH, G1DD. In regards to his home medications, suggested that he switch to high intensity statin. He prefers to finish out his supply of simvastatin and then consider switching to Crestor or Lipitor. Also suggest switching from Actos to Mariano Colan. He would like to further discuss this with his PCP. Did have episodes of 2nd degree AVB type I but remained asymptomatic. Will arrange for outpatient follow up with EP regarding further management. Hopefully with have improvement in symptoms with PCI. Worked well with cardiac rehab prior to discharge.   General: Well developed, well nourished, male appearing in no acute distress. Head: Normocephalic, atraumatic.  Neck: Supple without bruits, JVD. Lungs:  Resp regular and unlabored, CTA. Heart: RRR, S1, S2, no S3, S4, or murmur; no rub. Abdomen: Soft, non-tender, non-distended with normoactive bowel sounds. No hepatomegaly. No rebound/guarding. No obvious abdominal masses. Extremities: No clubbing, cyanosis, edema. Distal pedal pulses are 2+ bilaterally. Left radial cath site stable without bruising or hematoma  Neuro: Alert and oriented X 3. Moves all extremities spontaneously. Psych: Normal affect.  [name] was seen by Dr. Angelena Form and determined stable for discharge home. Follow up in the office has been arranged. Medications are listed below.   _____________  Discharge Vitals Blood pressure (!) 164/79, pulse 72, temperature 97.6 F (36.4 C), temperature source Oral, resp. rate (!) 21, height 6\' 2"  (1.88 m), weight 105 kg, SpO2 97 %.  Filed Weights   07/16/19 1145 07/18/19 0614  Weight: 102.5 kg 105 kg    Labs & Radiologic Studies    CBC Recent Labs    07/17/19 0146 07/18/19 0307  WBC 4.2 4.9  HGB 14.8 14.2  HCT 43.4 41.1  MCV 92.9 92.6  PLT 162 [id number]   Basic Metabolic Panel Recent Labs    07/17/19 0146 07/18/19 0307  NA 138 139  K 3.7 3.8  CL 103 105  CO2 25 23  GLUCOSE 222* 109*  BUN 17 11   CREATININE 1.04 0.91  CALCIUM 9.0 8.8*   Liver Function Tests No results for input(s): AST, ALT, ALKPHOS, BILITOT, PROT, ALBUMIN in the last 72 hours. No results for input(s): LIPASE, AMYLASE in the last 72 hours. Cardiac Enzymes No results for input(s): CKTOTAL, CKMB, CKMBINDEX, TROPONINI in the last 72 hours. BNP Invalid input(s): POCBNP D-Dimer No results for input(s): DDIMER in the last 72 hours. Hemoglobin A1C Recent Labs    07/17/19 0146  HGBA1C 6.7*   Fasting Lipid Panel Recent Labs    07/17/19 0146  CHOL 130  HDL 51  LDLCALC 55  TRIG 120  CHOLHDL 2.5   Thyroid Function Tests No results for input(s): TSH, T4TOTAL, T3FREE, THYROIDAB in the last 72 hours.  Invalid input(s): FREET3 _____________  Dg Chest 2 View  Result Date: 07/16/2019 CLINICAL DATA:  Substernal chest pain improved with nitroglycerin. EXAM: CHEST - 2 VIEW COMPARISON:  08/24/2014 FINDINGS: Previous median sternotomy and CABG. Heart size is normal. Aortic atherosclerosis with tortuosity. Pulmonary vascularity is normal. The lungs are clear. No effusions. No acute bone finding. IMPRESSION: No active disease. Previous CABG. Aortic atherosclerosis. Electronically Signed   By: Nelson Chimes M.D.   On: 07/16/2019 10:35   Disposition   Pt is being discharged home today in good condition.  Follow-up Plans & Appointments    Follow-up Information    Thompson Grayer, MD Follow up on 08/20/2019.   Specialty: Cardiology Why: at 10am for your follow up appt.  Contact information: [place] [phone]          Discharge Instructions    Amb Referral to Cardiac Rehabilitation   Complete by: As directed    Diagnosis:  Coronary Stents NSTEMI     After initial evaluation and assessments completed: Virtual Based Care may be provided alone or in conjunction with Phase 2 Cardiac Rehab based on patient barriers.: Yes   Diet - low sodium heart healthy   Complete by: As  directed    Discharge instructions   Complete by: As directed    Radial Site Care Refer to this sheet in the next few weeks. These instructions provide you with information on caring for yourself after your procedure. Your caregiver may also give you more specific instructions. Your treatment has been planned according to current medical practices, but problems sometimes occur. Call your caregiver if you have any problems or questions after your procedure. HOME CARE INSTRUCTIONS You may shower the day after the procedure.Remove the  bandage (dressing) and gently wash the site with plain soap and water.Gently pat the site dry.  Do not apply powder or lotion to the site.  Do not submerge the affected site in water for 3 to 5 days.  Inspect the site at least twice daily.  Do not flex or bend the affected arm for 24 hours.  No lifting over 5 pounds (2.3 kg) for 5 days after your procedure.  Do not drive home if you are discharged the same day of the procedure. Have someone else drive you.  You may drive 24 hours after the procedure unless otherwise instructed by your caregiver.  What to expect: Any bruising will usually fade within 1 to 2 weeks.  Blood that collects in the tissue (hematoma) may be painful to the touch. It should usually decrease in size and tenderness within 1 to 2 weeks.  SEEK IMMEDIATE MEDICAL CARE IF: You have unusual pain at the radial site.  You have redness, warmth, swelling, or pain at the radial site.  You have drainage (other than a small amount of blood on the dressing).  You have chills.  You have a fever or persistent symptoms for more than 72 hours.  You have a fever and your symptoms suddenly get worse.  Your arm becomes pale, cool, tingly, or numb.  You have heavy bleeding from the site. Hold pressure on the site.   PLEASE DO NOT MISS ANY DOSES OF YOUR BRILINTA!!!!! Also keep a log of you blood pressures and bring back to your follow up appt. Please call the  office with any questions.   Patients taking blood thinners should generally stay away from medicines like ibuprofen, Advil, Motrin, naproxen, and Aleve due to risk of stomach bleeding. You may take Tylenol as directed or talk to your primary doctor about alternatives.   Increase activity slowly   Complete by: As directed        Discharge Medications     Medication List    TAKE these medications   Align 4 MG Caps Take 1 capsule (4 mg total) by mouth daily.   allopurinol 300 MG tablet Commonly known as: ZYLOPRIM Take 1 tablet (300 mg total) by mouth daily.   aspirin EC 81 MG tablet Take 81 mg by mouth daily.   benazepril-hydrochlorthiazide 20-25 MG tablet Commonly known as: LOTENSIN HCT Take 1 tablet by mouth daily.   cetirizine 10 MG tablet Commonly known as: ZYRTEC Take 10 mg by mouth daily as needed for allergies.   Fish Oil 1000 MG Caps Take 1,000 mg by mouth daily.   furosemide 20 MG tablet Commonly known as: LASIX Take 1 tablet (20 mg total) by mouth daily as needed for fluid or edema.   glipiZIDE 5 MG tablet Commonly known as: GLUCOTROL Take 1 tablet (5 mg total) by mouth 2 (two) times daily.   glucose blood test strip Commonly known as: FREESTYLE LITE USE TWICE A DAY AS DIRECTED (E11.9)   Lantus 100 UNIT/ML injection Generic drug: insulin glargine Inject 5-50 Units into the skin 2 (two) times daily. Per sliding scale What changed: Another medication with the same name was removed. Continue taking this medication, and follow the directions you see here.   levothyroxine 100 MCG tablet Commonly known as: SYNTHROID Take 1 tablet (100 mcg total) by mouth every morning.   metFORMIN 1000 MG tablet Commonly known as: GLUCOPHAGE Take 1 tablet (1,000 mg total) by mouth 2 (two) times daily with a meal.   MULTIVITAMIN  PO Take 1 tablet by mouth daily.   nitroGLYCERIN 0.4 MG SL tablet Commonly known as: NITROSTAT Place 1 tablet (0.4 mg total) under the  tongue every 5 (five) minutes x 3 doses as needed for chest pain.   omeprazole 20 MG capsule Commonly known as: PRILOSEC Take 1 capsule (20 mg total) by mouth daily as needed (heartburn or acid reflux). What changed: when to take this   oxybutynin 15 MG 24 hr tablet Commonly known as: DITROPAN XL Take 15 mg by mouth daily.   pioglitazone 45 MG tablet Commonly known as: ACTOS Take 1 tablet (45 mg total) by mouth daily.   polycarbophil 625 MG tablet Commonly known as: FIBERCON Take 1,250 mg by mouth 2 (two) times daily.   polyethylene glycol 17 g packet Commonly known as: MIRALAX / GLYCOLAX Take 17 g by mouth daily as needed for moderate constipation.   simvastatin 40 MG tablet Commonly known as: ZOCOR Take 1 tablet (40 mg total) by mouth at bedtime.   ticagrelor 90 MG Tabs tablet Commonly known as: BRILINTA Take 1 tablet (90 mg total) by mouth 2 (two) times daily.        Yes                               AHA/ACC Clinical Performance & Quality Measures: 1. Aspirin prescribed? - Yes 2. ADP Receptor Inhibitor (Plavix/Clopidogrel, Brilinta/Ticagrelor or Effient/Prasugrel) prescribed (includes medically managed patients)? - Yes 3. Beta Blocker prescribed? - No - bradycardia 4. High Intensity Statin (Lipitor 40-80mg  or Crestor 20-40mg ) prescribed? - No - discussed options with patient, will readdress at outpatient follow up 5. EF assessed during THIS hospitalization? - Yes 6. For EF <40%, was ACEI/ARB prescribed? - Not Applicable (EF >/= [id number]) 7. For EF <40%, Aldosterone Antagonist (Spironolactone or Eplerenone) prescribed? - Not Applicable (EF >/= [id number]) 8. Cardiac Rehab Phase II ordered (Included Medically managed Patients)? - Yes      Outstanding Labs/Studies   N/a   Duration of Discharge Encounter   Greater than 30 minutes including physician time.  Signed, Reino Bellis NP-C 07/18/2019, 1:14 PM   I have personally seen and examined this patient with Reino Bellis, NP. I agree with the assessment and plan as outlined above. He is doing well post PCI of the SVG to the OM branches with placement of two drug eluting stents. Continue ASA and Brilinta. Discharge home today after echo is completed.   Reino Bellis 07/18/2019 1:14 PM

## 2019-07-19 ENCOUNTER — Ambulatory Visit: Payer: Medicare Other | Admitting: Student

## 2019-08-07 ENCOUNTER — Other Ambulatory Visit: Payer: Self-pay | Admitting: *Deleted

## 2019-08-07 MED ORDER — TICAGRELOR 90 MG PO TABS: 90.0000 mg | ORAL_TABLET | Freq: Two times a day (BID) | ORAL | 2 refills | Status: DC

## 2019-08-17 ENCOUNTER — Telehealth: Payer: Self-pay | Admitting: Internal Medicine

## 2019-08-17 NOTE — Telephone Encounter (Signed)
New message   Patient wants his wife to accompany him to the appt with Dr. Rayann Heman. He states that his wife assist him.

## 2019-08-17 NOTE — Telephone Encounter (Signed)
Left detailed message advising ok for wife to accompany to appt

## 2019-08-20 ENCOUNTER — Other Ambulatory Visit: Payer: Self-pay

## 2019-08-20 ENCOUNTER — Ambulatory Visit (INDEPENDENT_AMBULATORY_CARE_PROVIDER_SITE_OTHER): Payer: Medicare Other | Admitting: Internal Medicine

## 2019-08-20 ENCOUNTER — Encounter: Payer: Self-pay | Admitting: Internal Medicine

## 2019-08-20 VITALS — BP 110/68 | HR 50 | Ht 74.0 in | Wt 241.0 lb

## 2019-08-20 DIAGNOSIS — I10 Essential (primary) hypertension: Secondary | ICD-10-CM | POA: Diagnosis not present

## 2019-08-20 DIAGNOSIS — I25810 Atherosclerosis of coronary artery bypass graft(s) without angina pectoris: Secondary | ICD-10-CM

## 2019-08-20 DIAGNOSIS — I440 Atrioventricular block, first degree: Secondary | ICD-10-CM

## 2019-08-20 DIAGNOSIS — E785 Hyperlipidemia, unspecified: Secondary | ICD-10-CM

## 2019-08-20 DIAGNOSIS — I441 Atrioventricular block, second degree: Secondary | ICD-10-CM | POA: Insufficient documentation

## 2019-08-20 MED ORDER — CLOPIDOGREL BISULFATE 75 MG PO TABS: ORAL_TABLET | ORAL | 3 refills | Status: DC

## 2019-08-20 NOTE — Patient Instructions (Addendum)
Medication Instructions:  Your physician has recommended you make the following change in your medication:   1.  Stop Brilinta  2.  12 hours after your last dose of Brilinta--START Plavix--take 4 tablets (300 mg) ONCE.  3.  24 hours AFTER your loading dose of Plavix--you will start your normal dosing routine of Plavix 75 mg one tablet by mouth ONCE a day.  Labwork: None ordered.  Testing/Procedures: None ordered.  Follow-Up: Your physician wants you to follow-up in: 3 months with Richardson Dopp PA.     November 13, 2019 at 8:45 am in OFFICE   Any Other Special Instructions Will Be Listed Below (If Applicable).  If you need a refill on your cardiac medications before your next appointment, please call your pharmacy.

## 2019-08-20 NOTE — Progress Notes (Signed)
PCP: Dorena Cookey, MD (Inactive)   Primary EP: Dr Ronnald Ramp is a 84 y.o. male who presents today for routine electrophysiology followup.  Since last being seen in our clinic, the patient reports doing reasonably well.  He is very SOB.   He reports feeling "claustophobic".  He feels that this began with starting brilinta.  Today, he denies symptoms of palpitations, chest pain, lower extremity edema, dizziness, presyncope, or syncope.  The patient is otherwise without complaint today.   Past Medical History:  Diagnosis Date  . Benign prostatic hypertrophy   . CAD (coronary artery disease)    CABG 2005 by Dr Cyndia Bent  . DJD (degenerative joint disease)    bilateral knee, gout  . GERD (gastroesophageal reflux disease)    HX OF  . Glomerulonephritis acute    at age 48, due to beta strep, resolved, renal calculi- passed spontaneously  . Gout   . Hepatitis A 1950  . Hepatitis B 1980's  . History of kidney stones    3-4 TIMES SINCE 1970 PASSED ON OWN  . HOH (hard of hearing)    wears hearing aids  . Hyperlipidemia   . Hypertension   . Hypothyroidism   . Incontinence of urine   . Legally blind in right eye, as defined in [place]   . Personal history of other infectious and parasitic disease   . Sleep apnea 1990's   "before uvulectomy"  . Squamous cell carcinoma    "lots on my arms & face" (09/21/2013)  . Sun-damaged skin   . 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 Type II diabetes mellitus ([place])    Past Surgical History:  Procedure Laterality Date  . CARDIAC CATHETERIZATION    . CATARACT EXTRACTION W/ INTRAOCULAR LENS  IMPLANT, BILATERAL Bilateral   . CORONARY ARTERY BYPASS GRAFT  01/23/04   Dr Cyndia Bent  . CORONARY STENT INTERVENTION  07/17/2019  . CORONARY STENT INTERVENTION N/A 07/17/2019   Procedure: CORONARY STENT INTERVENTION;  Surgeon: Martinique, Peter M, MD;  Location: [facility];  Service: Cardiovascular;   Laterality: N/A;  . CYSTOSCOPY WITH INSERTION OF UROLIFT N/A 06/23/2017   Procedure: CYSTOSCOPY WITH INSERTION OF UROLIFT;  Surgeon: Franchot Gallo, MD;  Location: [facility];  Service: Urology;  Laterality: N/A;  . EYE SURGERY Right    detached retina ,blind in right eye  . INGUINAL HERNIA REPAIR Bilateral 1980's - 1990's  . LEFT HEART CATH AND CORONARY ANGIOGRAPHY N/A 07/17/2019   Procedure: LEFT HEART CATH AND CORONARY ANGIOGRAPHY;  Surgeon: Martinique, Peter M, MD;  Location: [facility];  Service: Cardiovascular;  Laterality: N/A;  . MASTOIDECTOMY Right 1943  . QUADRICEPS REPAIR Right 2009  . RETINAL DETACHMENT SURGERY Right X 2  . SEPSIS  1943  . TONSILLECTOMY  ~ 1943  . TOTAL KNEE ARTHROPLASTY Left 09/18/2013   Procedure: TOTAL KNEE ARTHROPLASTY;  Surgeon: Hessie Dibble, MD;  Location: [place] [facility];  Service: Orthopedics;  Laterality: Left;  . TOTAL KNEE ARTHROPLASTY Right 08/22/2014   Procedure: RIGHT TOTAL KNEE ARTHROPLASTY;  Surgeon: Hessie Dibble, MD;  Location: [place];  Service: Orthopedics;  Laterality: Right;  . UMBILICAL HERNIA REPAIR    . UVULOPALATOPLASTY  1990's   ENT MD DR Rock Nephew    ROS- all systems are reviewed and negatives except as per HPI above  Current Outpatient Medications  Medication Sig Dispense Refill  .  allopurinol (ZYLOPRIM) 300 MG tablet Take 1 tablet (300 mg total) by mouth daily. 90 tablet 4  . aspirin EC 81 MG tablet Take 81 mg by mouth daily.    . benazepril-hydrochlorthiazide (LOTENSIN HCT) 20-25 MG tablet Take 1 tablet by mouth daily. 90 tablet 3  . cetirizine (ZYRTEC) 10 MG tablet Take 10 mg by mouth daily as needed for allergies.    . furosemide (LASIX) 20 MG tablet Take 1 tablet (20 mg total) by mouth daily as needed for fluid or edema. 100 tablet 3  . glipiZIDE (GLUCOTROL) 5 MG tablet Take 1 tablet (5 mg total) by mouth 2 (two) times daily. 180 tablet 4  . glucose blood (FREESTYLE LITE) test strip USE TWICE A DAY AS DIRECTED  (E11.9) 200 each 1  . insulin glargine (LANTUS) 100 UNIT/ML injection Inject 5-50 Units into the skin 2 (two) times daily. Per sliding scale    . levothyroxine (SYNTHROID, LEVOTHROID) 100 MCG tablet Take 1 tablet (100 mcg total) by mouth every morning. 90 tablet 4  . metFORMIN (GLUCOPHAGE) 1000 MG tablet Take 1 tablet (1,000 mg total) by mouth 2 (two) times daily with a meal. 200 tablet 4  . Multiple Vitamin (MULTIVITAMIN PO) Take 1 tablet by mouth daily.      . nitroGLYCERIN (NITROSTAT) 0.4 MG SL tablet Place 1 tablet (0.4 mg total) under the tongue every 5 (five) minutes x 3 doses as needed for chest pain. 25 tablet 2  . Omega-3 Fatty Acids (FISH OIL) 1000 MG CAPS Take 1,000 mg by mouth daily.     [name] Kitchen omeprazole (PRILOSEC) 20 MG capsule Take 1 capsule (20 mg total) by mouth daily as needed (heartburn or acid reflux). (Patient taking differently: Take 20 mg by mouth daily. ) 90 capsule 4  . oxybutynin (DITROPAN XL) 15 MG 24 hr tablet Take 15 mg by mouth daily.    . pioglitazone (ACTOS) 45 MG tablet Take 1 tablet (45 mg total) by mouth daily. 90 tablet 3  . polycarbophil (FIBERCON) 625 MG tablet Take 1,250 mg by mouth 2 (two) times daily.     . polyethylene glycol (MIRALAX / GLYCOLAX) packet Take 17 g by mouth daily as needed for moderate constipation.    . Probiotic Product (ALIGN) 4 MG CAPS Take 1 capsule (4 mg total) by mouth daily. 90 capsule 3  . simvastatin (ZOCOR) 40 MG tablet Take 1 tablet (40 mg total) by mouth at bedtime. 90 tablet 3  . ticagrelor (BRILINTA) 90 MG TABS tablet Take 1 tablet (90 mg total) by mouth 2 (two) times daily. 180 tablet 2   No current facility-administered medications for this visit.    Physical Exam: Vitals:   08/20/19 1018  BP: 110/68  Pulse: (!) 50  SpO2: 98%  Weight: 241 lb (109.3 kg)  Height: 6\' 2"  (1.88 m)    GEN- The patient is well appearing, alert and oriented x 3 today.   Head- normocephalic, atraumatic Eyes-  Sclera clear, conjunctiva  pink Ears- hearing intact Oropharynx- clear Lungs-   normal work of breathing Heart- Regular rate and rhythm  GI- soft  Extremities- no clubbing, cyanosis, or edema  Wt Readings from Last 3 Encounters:  08/20/19 241 lb (109.3 kg)  07/18/19 231 lb 8 oz (105 kg)  01/15/19 223 lb (101.2 kg)   EKG tracing ordered today is personally reviewed and shows sinus with mobitz I second degree AV block, no new ischemic changes  Assessment and Plan:  1. CAD SOB which he  attributes to brilinta Stop brilinta Tomorrow am, start plavix 300mg  PO x 1 then 75mg  daily If symptoms persist, additional workup may be required  2. HTN Stable No change required today  3. First degree AV block Stable and without symptoms No change required today No indication of pacing currently  Return to see Richardson Dopp in 3 months  Thompson Grayer MD, [facility] 08/20/2019 10:43 AM

## 2019-08-22 DIAGNOSIS — Z23 Encounter for immunization: Secondary | ICD-10-CM | POA: Diagnosis not present

## 2019-08-24 ENCOUNTER — Telehealth: Payer: Self-pay | Admitting: Internal Medicine

## 2019-08-24 ENCOUNTER — Telehealth (HOSPITAL_COMMUNITY): Payer: Self-pay

## 2019-08-24 NOTE — Telephone Encounter (Signed)
New message   Pt c/o medication issue:  1. Name of Medication:clopidogrel (PLAVIX) 75 MG tablet  2. How are you currently taking this medication (dosage and times per day)? As written  3. Are you having a reaction (difficulty breathing--STAT)?no   4. What is your medication issue? Patient states that has panic attacks, insomnia. Please call to the patient to discuss.

## 2019-08-24 NOTE — Telephone Encounter (Signed)
Patient returning call. States he did not hear the phone ring.

## 2019-08-24 NOTE — Telephone Encounter (Signed)
Tried to call patient back, no answer.

## 2019-08-24 NOTE — Telephone Encounter (Signed)
Called patient back about his message. Consulted Melissa Pharm D who stated Plavix is not know to have these side effects. Informed patient of this information. Patient stated something is causing him to have insomnia and panic attacks. Encouraged patient to see his PCP for further advisement. Patient verbalized understanding.

## 2019-08-24 NOTE — Telephone Encounter (Signed)
Called and spoke with pt in regards to CR, pt stated he did the program before and has all the tools to do it now.  Closed referral

## 2019-09-18 DIAGNOSIS — Z23 Encounter for immunization: Secondary | ICD-10-CM | POA: Diagnosis not present

## 2019-11-13 ENCOUNTER — Ambulatory Visit: Payer: Medicare Other | Admitting: Physician Assistant

## 2019-11-14 DIAGNOSIS — R350 Frequency of micturition: Secondary | ICD-10-CM | POA: Diagnosis not present

## 2019-11-14 DIAGNOSIS — N401 Enlarged prostate with lower urinary tract symptoms: Secondary | ICD-10-CM | POA: Diagnosis not present

## 2019-11-14 DIAGNOSIS — R972 Elevated prostate specific antigen [PSA]: Secondary | ICD-10-CM | POA: Diagnosis not present

## 2020-01-03 DIAGNOSIS — C4441 Basal cell carcinoma of skin of scalp and neck: Secondary | ICD-10-CM | POA: Diagnosis not present

## 2020-01-03 DIAGNOSIS — L570 Actinic keratosis: Secondary | ICD-10-CM | POA: Diagnosis not present

## 2020-01-03 DIAGNOSIS — D0422 Carcinoma in situ of skin of left ear and external auricular canal: Secondary | ICD-10-CM | POA: Diagnosis not present

## 2020-01-03 DIAGNOSIS — D485 Neoplasm of uncertain behavior of skin: Secondary | ICD-10-CM | POA: Diagnosis not present

## 2020-01-03 DIAGNOSIS — Z85828 Personal history of other malignant neoplasm of skin: Secondary | ICD-10-CM | POA: Diagnosis not present

## 2020-01-03 DIAGNOSIS — C44219 Basal cell carcinoma of skin of left ear and external auricular canal: Secondary | ICD-10-CM | POA: Diagnosis not present

## 2020-01-03 DIAGNOSIS — Z85820 Personal history of malignant melanoma of skin: Secondary | ICD-10-CM | POA: Diagnosis not present

## 2020-01-11 ENCOUNTER — Telehealth: Payer: Self-pay

## 2020-01-11 NOTE — Telephone Encounter (Signed)
Spoke with pts wife regarding his virtual appt. Pts wife stated pt will check vitals prior to appt.

## 2020-01-14 ENCOUNTER — Encounter: Payer: Self-pay | Admitting: Internal Medicine

## 2020-01-14 ENCOUNTER — Other Ambulatory Visit: Payer: Self-pay

## 2020-01-14 ENCOUNTER — Telehealth (INDEPENDENT_AMBULATORY_CARE_PROVIDER_SITE_OTHER): Payer: Medicare Other | Admitting: Internal Medicine

## 2020-01-14 VITALS — BP 133/77 | HR 70 | Ht 74.0 in | Wt 224.0 lb

## 2020-01-14 DIAGNOSIS — I10 Essential (primary) hypertension: Secondary | ICD-10-CM

## 2020-01-14 DIAGNOSIS — I25810 Atherosclerosis of coronary artery bypass graft(s) without angina pectoris: Secondary | ICD-10-CM

## 2020-01-14 DIAGNOSIS — I25708 Atherosclerosis of coronary artery bypass graft(s), unspecified, with other forms of angina pectoris: Secondary | ICD-10-CM

## 2020-01-14 DIAGNOSIS — I440 Atrioventricular block, first degree: Secondary | ICD-10-CM | POA: Diagnosis not present

## 2020-01-14 NOTE — Progress Notes (Signed)
Electrophysiology TeleHealth Note   Due to national recommendations of social distancing due to COVID 19, an audio/video telehealth visit is felt to be most appropriate for this patient at this time.  See MyChart message from today for the patient's consent to telehealth for [facility].  Date:  01/14/2020   ID:  Jonathan [name], DOB June 11, 1936, MRN [id number]  Location: patient's home  Provider location:  [place]  Evaluation Performed: Follow-up visit  PCP:  Dorena Cookey, MD (Inactive)   Electrophysiologist:  Dr Rayann Heman  Chief Complaint:  palpitations  History of Present Illness:    Jonathan [name] is a 84 y.o. male who presents via telehealth conferencing today.  Since last being seen in our clinic, the patient reports doing very well.  His SOB resolved off of brilinta.  Today, he denies symptoms of palpitations, chest pain, lower extremity edema, dizziness, presyncope, or syncope.  He is enjoying life at [place].  Walking 1-3 miles per day.  The patient is otherwise without complaint today.   Past Medical History:  Diagnosis Date  . Benign prostatic hypertrophy   . CAD (coronary artery disease)    CABG 2005 by Dr Cyndia Bent  . DJD (degenerative joint disease)    bilateral knee, gout  . GERD (gastroesophageal reflux disease)    HX OF  . Glomerulonephritis acute    at age 73, due to beta strep, resolved, renal calculi- passed spontaneously  . Gout   . Hepatitis A 1950  . Hepatitis B 1980's  . History of kidney stones    3-4 TIMES SINCE 1970 PASSED ON OWN  . HOH (hard of hearing)    wears hearing aids  . Hyperlipidemia   . Hypertension   . Hypothyroidism   . Incontinence of urine   . Legally blind in right eye, as defined in [place]   . Personal history of other infectious and parasitic disease   . Sleep apnea 1990's   "before uvulectomy"  . Squamous cell carcinoma    "lots on my arms & face" (09/21/2013)  . Sun-damaged skin   . 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 Type II diabetes mellitus ([place])     Past Surgical History:  Procedure Laterality Date  . CARDIAC CATHETERIZATION    . CATARACT EXTRACTION W/ INTRAOCULAR LENS  IMPLANT, BILATERAL Bilateral   . CORONARY ARTERY BYPASS GRAFT  01/23/04   Dr Cyndia Bent  . CORONARY STENT INTERVENTION  07/17/2019  . CORONARY STENT INTERVENTION N/A 07/17/2019   Procedure: CORONARY STENT INTERVENTION;  Surgeon: Martinique, Peter M, MD;  Location: [place] [facility];  Service: Cardiovascular;  Laterality: N/A;  . CYSTOSCOPY WITH INSERTION OF UROLIFT N/A 06/23/2017   Procedure: CYSTOSCOPY WITH INSERTION OF UROLIFT;  Surgeon: Franchot Gallo, MD;  Location: [facility];  Service: Urology;  Laterality: N/A;  . EYE SURGERY Right    detached retina ,blind in right eye  . INGUINAL HERNIA REPAIR Bilateral 1980's - 1990's  . LEFT HEART CATH AND CORONARY ANGIOGRAPHY N/A 07/17/2019   Procedure: LEFT HEART CATH AND CORONARY ANGIOGRAPHY;  Surgeon: Martinique, Peter M, MD;  Location: [place] [facility];  Service: Cardiovascular;  Laterality: N/A;  . MASTOIDECTOMY Right 1943  . QUADRICEPS REPAIR Right 2009  . RETINAL DETACHMENT SURGERY Right X 2  . SEPSIS  1943  . TONSILLECTOMY  ~ 1943  . TOTAL KNEE ARTHROPLASTY Left 09/18/2013  Procedure: TOTAL KNEE ARTHROPLASTY;  Surgeon: Hessie Dibble, MD;  Location: [place];  Service: Orthopedics;  Laterality: Left;  . TOTAL KNEE ARTHROPLASTY Right 08/22/2014   Procedure: RIGHT TOTAL KNEE ARTHROPLASTY;  Surgeon: Hessie Dibble, MD;  Location: [place];  Service: Orthopedics;  Laterality: Right;  . UMBILICAL HERNIA REPAIR    . UVULOPALATOPLASTY  1990's   ENT MD DR Rock Nephew    Current Outpatient Medications  Medication Sig Dispense Refill  . allopurinol (ZYLOPRIM) 300 MG tablet Take 1 tablet (300 mg total) by mouth daily. 90 tablet 4  . aspirin EC 81 MG tablet Take 81 mg by mouth daily.    .  benazepril-hydrochlorthiazide (LOTENSIN HCT) 20-25 MG tablet Take 1 tablet by mouth daily. 90 tablet 3  . cetirizine (ZYRTEC) 10 MG tablet Take 10 mg by mouth daily as needed for allergies.    [name] Kitchen clopidogrel (PLAVIX) 75 MG tablet Take 4 tablets 12 hours after your last Brilinta dose.  Then 24 hours after this loading dose you will take one tablet by mouth daily. 94 tablet 3  . furosemide (LASIX) 20 MG tablet Take 1 tablet (20 mg total) by mouth daily as needed for fluid or edema. 100 tablet 3  . glipiZIDE (GLUCOTROL) 5 MG tablet Take 1 tablet (5 mg total) by mouth 2 (two) times daily. 180 tablet 4  . glucose blood (FREESTYLE LITE) test strip USE TWICE A DAY AS DIRECTED (E11.9) 200 each 1  . insulin glargine (LANTUS) 100 UNIT/ML injection Inject 5-50 Units into the skin 2 (two) times daily. Per sliding scale    . levothyroxine (SYNTHROID, LEVOTHROID) 100 MCG tablet Take 1 tablet (100 mcg total) by mouth every morning. 90 tablet 4  . metFORMIN (GLUCOPHAGE) 1000 MG tablet Take 1 tablet (1,000 mg total) by mouth 2 (two) times daily with a meal. 200 tablet 4  . Multiple Vitamin (MULTIVITAMIN PO) Take 1 tablet by mouth daily.      . nitroGLYCERIN (NITROSTAT) 0.4 MG SL tablet Place 1 tablet (0.4 mg total) under the tongue every 5 (five) minutes x 3 doses as needed for chest pain. 25 tablet 2  . Omega-3 Fatty Acids (FISH OIL) 1000 MG CAPS Take 1,000 mg by mouth daily.     [name] Kitchen omeprazole (PRILOSEC) 20 MG capsule Take 1 capsule (20 mg total) by mouth daily as needed (heartburn or acid reflux). (Patient taking differently: Take 20 mg by mouth daily. ) 90 capsule 4  . oxybutynin (DITROPAN XL) 15 MG 24 hr tablet Take 15 mg by mouth daily.    . pioglitazone (ACTOS) 45 MG tablet Take 1 tablet (45 mg total) by mouth daily. 90 tablet 3  . polycarbophil (FIBERCON) 625 MG tablet Take 1,250 mg by mouth 2 (two) times daily.     . polyethylene glycol (MIRALAX / GLYCOLAX) packet Take 17 g by mouth daily as needed for moderate  constipation.    . Probiotic Product (ALIGN) 4 MG CAPS Take 1 capsule (4 mg total) by mouth daily. 90 capsule 3  . simvastatin (ZOCOR) 40 MG tablet Take 1 tablet (40 mg total) by mouth at bedtime. 90 tablet 3   No current facility-administered medications for this visit.    Allergies:   Bee venom   Social History:  The patient  reports that he quit smoking about 54 years ago. His smoking use included cigarettes. He has a 10.00 pack-year smoking history. He has never used smokeless tobacco. He reports current alcohol use. He reports that  he does not use drugs.   ROS:  Please see the history of present illness.   All other systems are personally reviewed and negative.    Exam:    Vital Signs:  BP 133/77   Pulse 70   Ht 6\' 2"  (1.88 m)   Wt 224 lb (101.6 kg)   BMI 28.76 kg/m   Well sounding and appearing, alert and conversant, regular work of breathing,  good skin color Eyes- anicteric, neuro- grossly intact, skin- no apparent rash or lesions or cyanosis, mouth- oral mucosa is pink  Labs/Other Tests and Data Reviewed:    Recent Labs: 07/18/2019: BUN 11; Creatinine, Ser 0.91; Hemoglobin 14.2; Platelets 154; Potassium 3.8; Sodium 139   Wt Readings from Last 3 Encounters:  01/14/20 224 lb (101.6 kg)  08/20/19 241 lb (109.3 kg)  07/18/19 231 lb 8 oz (105 kg)      ASSESSMENT & PLAN:    1.  CAD No ischemic symptoms SOB has improved off of brilinta Doing well with plavix  2. HTN Stable No change required today  3. Overweight He has lost 20 lbs in the last year!  4. First degree AV block Asymptomatic  5. HL Continue zocor I have asked that he have PCP obtain LFTs and liver profile   Risks, benefits and potential toxicities for medications prescribed and/or refilled reviewed with patient today.   Follow-up:  6 months with me   Patient Risk:  after full review of this patients clinical status, I feel that they are at moderate risk at this time.  Today, I have spent  15 minutes with the patient with telehealth technology discussing arrhythmia management .    Army Fossa, MD  01/14/2020 10:36 AM     [facility] [address] [place] [address] [phone] (office) [phone] (fax)

## 2020-02-26 DIAGNOSIS — L84 Corns and callosities: Secondary | ICD-10-CM | POA: Diagnosis not present

## 2020-02-26 DIAGNOSIS — M79671 Pain in right foot: Secondary | ICD-10-CM | POA: Diagnosis not present

## 2020-02-26 DIAGNOSIS — Z89422 Acquired absence of other left toe(s): Secondary | ICD-10-CM | POA: Diagnosis not present

## 2020-02-26 DIAGNOSIS — M79672 Pain in left foot: Secondary | ICD-10-CM | POA: Diagnosis not present

## 2020-03-30 DIAGNOSIS — Z23 Encounter for immunization: Secondary | ICD-10-CM | POA: Diagnosis not present

## 2020-05-20 DIAGNOSIS — Z23 Encounter for immunization: Secondary | ICD-10-CM | POA: Diagnosis not present

## 2020-06-04 DIAGNOSIS — Z0189 Encounter for other specified special examinations: Secondary | ICD-10-CM | POA: Diagnosis not present

## 2020-06-04 DIAGNOSIS — I10 Essential (primary) hypertension: Secondary | ICD-10-CM | POA: Diagnosis not present

## 2020-06-04 DIAGNOSIS — E782 Mixed hyperlipidemia: Secondary | ICD-10-CM | POA: Diagnosis not present

## 2020-06-04 DIAGNOSIS — E118 Type 2 diabetes mellitus with unspecified complications: Secondary | ICD-10-CM | POA: Diagnosis not present

## 2020-06-05 DIAGNOSIS — E039 Hypothyroidism, unspecified: Secondary | ICD-10-CM | POA: Diagnosis not present

## 2020-06-05 DIAGNOSIS — E118 Type 2 diabetes mellitus with unspecified complications: Secondary | ICD-10-CM | POA: Diagnosis not present

## 2020-06-05 DIAGNOSIS — F5101 Primary insomnia: Secondary | ICD-10-CM | POA: Diagnosis not present

## 2020-06-05 DIAGNOSIS — Z0000 Encounter for general adult medical examination without abnormal findings: Secondary | ICD-10-CM | POA: Diagnosis not present

## 2020-10-17 DIAGNOSIS — K1120 Sialoadenitis, unspecified: Secondary | ICD-10-CM | POA: Insufficient documentation

## 2020-11-07 DIAGNOSIS — R221 Localized swelling, mass and lump, neck: Secondary | ICD-10-CM | POA: Insufficient documentation

## 2020-11-17 ENCOUNTER — Other Ambulatory Visit: Payer: Self-pay | Admitting: Otolaryngology

## 2020-11-17 ENCOUNTER — Other Ambulatory Visit (HOSPITAL_COMMUNITY): Payer: Self-pay | Admitting: Otolaryngology

## 2020-11-17 DIAGNOSIS — C770 Secondary and unspecified malignant neoplasm of lymph nodes of head, face and neck: Secondary | ICD-10-CM

## 2020-11-24 DIAGNOSIS — C080 Malignant neoplasm of submandibular gland: Secondary | ICD-10-CM | POA: Insufficient documentation

## 2020-11-28 ENCOUNTER — Encounter (HOSPITAL_COMMUNITY): Payer: Self-pay

## 2020-11-28 ENCOUNTER — Ambulatory Visit (HOSPITAL_COMMUNITY): Payer: Medicare Other

## 2020-12-04 DIAGNOSIS — Z794 Long term (current) use of insulin: Secondary | ICD-10-CM | POA: Insufficient documentation

## 2020-12-22 ENCOUNTER — Telehealth: Payer: Self-pay | Admitting: Internal Medicine

## 2020-12-22 NOTE — Telephone Encounter (Signed)
The patient was on Plavix for stent placement in December 2020. Around mid April he had to have cancer removed and the surgeon had him stop before his procedure. After the surgeons team had a follow up with him. At that time they discussed how long it had been since the stent placement and decided that he didn't need to restart his Plavix back. Wants to make sure Dr. Rayann Heman agrees with this and let him know that he starts a 6 wk radiation treatment in 2 weeks. Will forward to Dr. Rayann Heman.

## 2020-12-22 NOTE — Telephone Encounter (Signed)
Gave Dr. Jackalyn Lombard recommendations to the patient. He verbalized understanding.

## 2020-12-22 NOTE — Telephone Encounter (Signed)
Ok to take ASA 81mg  daily and stop plavix

## 2020-12-22 NOTE — Telephone Encounter (Signed)
Pt called in and stated he is being treated as a cancer pt.  He has some questions about All of his meds that Dr Rayann Heman put him on.  He requested to speak with Dr Rayann Heman personally.    Best number for call back is [phone]

## 2020-12-22 NOTE — Telephone Encounter (Signed)
Left message to call back  

## 2021-04-09 DIAGNOSIS — C44529 Squamous cell carcinoma of skin of other part of trunk: Secondary | ICD-10-CM | POA: Insufficient documentation

## 2021-04-09 DIAGNOSIS — D040 Carcinoma in situ of skin of lip: Secondary | ICD-10-CM | POA: Insufficient documentation

## 2021-06-18 ENCOUNTER — Encounter: Payer: Self-pay | Admitting: *Deleted

## 2021-06-18 NOTE — Progress Notes (Signed)
PATIENT NAVIGATOR PROGRESS NOTE  Name: Jonathan [name] Date: 06/18/2021 MRN: [id number]  DOB: [birth year]   Reason for visit:  Referral received from Dr Wilhemina Bonito  Comments:  Spoke with patient and he would like to establish care locally. He has been treated and followed by [facility] but would like any treatment done here at [facility]  Briefly reviewed scheduling upcoming appt and instructed him we will be scheduling him after chart review.  Patient appreciated call    Time spent counseling/coordinating care: 15-30 minutes

## 2021-06-19 ENCOUNTER — Telehealth: Payer: Self-pay | Admitting: *Deleted

## 2021-06-25 ENCOUNTER — Encounter: Payer: Self-pay | Admitting: Rehabilitation

## 2021-06-25 ENCOUNTER — Ambulatory Visit: Payer: Medicare Other | Attending: Nurse Practitioner | Admitting: Rehabilitation

## 2021-06-25 ENCOUNTER — Other Ambulatory Visit: Payer: Self-pay

## 2021-06-25 DIAGNOSIS — Z483 Aftercare following surgery for neoplasm: Secondary | ICD-10-CM | POA: Diagnosis present

## 2021-06-25 DIAGNOSIS — I890 Lymphedema, not elsewhere classified: Secondary | ICD-10-CM | POA: Diagnosis present

## 2021-06-25 DIAGNOSIS — L599 Disorder of the skin and subcutaneous tissue related to radiation, unspecified: Secondary | ICD-10-CM | POA: Insufficient documentation

## 2021-06-25 NOTE — Therapy (Signed)
[facility] @ [place], [place], [address] Phone: [phone]   Fax:  [phone]  Physical Therapy Evaluation  Patient Details  Name: Jonathan [name] MRN: [id number] Date of Birth: 05-08-1936 Referring Provider (PT): Francine Graven NP   Encounter Date: 06/25/2021   PT End of Session - 06/25/21 1421     Visit Number 1    Number of Visits 6    Date for PT Re-Evaluation 08/06/21    Progress Note Due on Visit 10    PT Start Time 1233    PT Stop Time 1325    PT Time Calculation (min) 52 min    Activity Tolerance Patient tolerated treatment well    Behavior During Therapy [facility] for tasks assessed/performed             Past Medical History:  Diagnosis Date   Benign prostatic hypertrophy    CAD (coronary artery disease)    CABG 2005 by Dr Cyndia Bent   DJD (degenerative joint disease)    bilateral knee, gout   GERD (gastroesophageal reflux disease)    HX OF   Glomerulonephritis acute    at age 64, due to beta strep, resolved, renal calculi- passed spontaneously   Gout    Hepatitis A 1950   Hepatitis B 1980's   History of kidney stones    3-4 TIMES SINCE 1970 PASSED ON OWN   HOH (hard of hearing)    wears hearing aids   Hyperlipidemia    Hypertension    Hypothyroidism    Incontinence of urine    Legally blind in right eye, as defined in [place]    Personal history of other infectious and parasitic disease    Sleep apnea 1990's   "before uvulectomy"   Squamous cell carcinoma    "lots on my arms & face" (09/21/2013)   Sun-damaged skin    Tuberculosis    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 IN MED SCHOOL IN 1961[facility] HAD TB   Type II diabetes mellitus ([place])     Past Surgical History:  Procedure Laterality Date   CARDIAC CATHETERIZATION     CATARACT EXTRACTION W/ INTRAOCULAR LENS  IMPLANT, BILATERAL Bilateral    CORONARY ARTERY BYPASS GRAFT  01/23/04   Dr Cyndia Bent   CORONARY STENT INTERVENTION  07/17/2019    CORONARY STENT INTERVENTION N/A 07/17/2019   Procedure: CORONARY STENT INTERVENTION;  Surgeon: Martinique, Peter M, MD;  Location: [place] [facility];  Service: Cardiovascular;  Laterality: N/A;   CYSTOSCOPY WITH INSERTION OF UROLIFT N/A 06/23/2017   Procedure: CYSTOSCOPY WITH INSERTION OF UROLIFT;  Surgeon: Franchot Gallo, MD;  Location: [facility];  Service: Urology;  Laterality: N/A;   EYE SURGERY Right    detached retina ,blind in right eye   INGUINAL HERNIA REPAIR Bilateral 1980's - 1990's   LEFT HEART CATH AND CORONARY ANGIOGRAPHY N/A 07/17/2019   Procedure: LEFT HEART CATH AND CORONARY ANGIOGRAPHY;  Surgeon: Martinique, Peter M, MD;  Location: [place] [facility];  Service: Cardiovascular;  Laterality: N/A;   MASTOIDECTOMY Right 1943   QUADRICEPS REPAIR Right 2009   RETINAL DETACHMENT SURGERY Right X 2   SEPSIS  1943   TONSILLECTOMY  ~ 1943   TOTAL KNEE ARTHROPLASTY Left 09/18/2013   Procedure: TOTAL KNEE ARTHROPLASTY;  Surgeon: Hessie Dibble, MD;  Location: [place];  Service: Orthopedics;  Laterality: Left;   TOTAL KNEE ARTHROPLASTY Right 08/22/2014   Procedure: RIGHT TOTAL KNEE ARTHROPLASTY;  Surgeon: Hessie Dibble, MD;  Location: [place];  Service: Orthopedics;  Laterality: Right;   UMBILICAL HERNIA REPAIR     UVULOPALATOPLASTY  1990's   ENT MD DR Rock Nephew    There were no vitals filed for this visit.    Subjective Assessment - 06/25/21 1234     Subjective swelling present since surgery. It is not fluctuating.  Sleeping on a small wedge.    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 On 12/04/20 patient underwent right level 1-4 neck dissection with pathology showing a 3.5cm tumor in R level Ib, negative margins, PNI+. 0/21 LN involved Marginal mandibular nerve encountered going directly into the mass and was sacrificed. Patient completed PORT (unilateral neck field) from 01/16/21-02/26/21. More recently patient was seen by his local  dermatologist for 2 new lesions that were biopsied: one lesion was to the midline chest revealing well differentiated squamous cell carcinoma (plan for Moh's) and the other lesion was to the left lower lip revealing squamous carcinoma in situ. Hx of DM of CABG    Patient Stated Goals what to do for the swelling    Currently in Pain? No/denies                [facility] PT Assessment - 06/25/21 0001       Assessment   Medical Diagnosis SCC    Referring Provider (PT) Francine Graven NP    Onset Date/Surgical Date 12/04/20    Next MD Visit tomorrow to establish local oncologist Dr. Benay Spice    Prior Therapy Speech in [place]      Precautions   Precaution Comments lymphedema neck      Restrictions   Weight Bearing Restrictions No      Balance Screen   Has the patient fallen in the past 6 months No    Has the patient had a decrease in activity level because of a fear of falling?  No    Is the patient reluctant to leave their home because of a fear of falling?  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   Leisure Scientist, research (life sciences), golf      Cognition   Overall Cognitive Status Within Functional Limits for tasks assessed      Observation/Other Assessments   Observations sumbental edema pocket, some off midline positioning of the lip - pt reports nerve had to be removed Rt side of face      Posture/Postural Control   Posture/Postural Control Postural limitations    Postural Limitations Rounded Shoulders;Forward head      ROM / Strength   AROM / PROM / Strength AROM      AROM   Overall AROM Comments Pt reports no limitations in neck mobility since treatment      Palpation   Palpation comment no pitting - soft edema submentally                LYMPHEDEMA/ONCOLOGY QUESTIONNAIRE - 06/25/21 0001       Type   Cancer Type SSC of oropharynx      Surgeries   Number Lymph Nodes Removed 21      Treatment   Active Radiation Treatment No    Past Radiation Treatment Yes  Lymphedema Assessments   Lymphedema Assessments Head and Neck      Head and Neck   Right Lateral Nostril at base of nose to medial tragus  10.5 cm    Left Lateral Nostril at base of nose to medial tragus  10.17 cm    Right Corner of mouth to where ear lobe meets face 11.5 cm    Left Corner of mouth to where ear lobe meets face 11 cm    6 cm superior to sternal notch around neck 43 cm   at largest point   Other chin strap 29.5                     Objective measurements completed on examination: See above findings.       [facility] Adult PT Treatment/Exercise - 06/25/21 0001       Manual Therapy   Manual Therapy Manual Lymphatic Drainage (MLD);Edema management    Edema Management Made pt chip pack for use with education on this and showed him and his wife options for solaris and jovipak depending on how the chip pack works    Manual Lymphatic Drainage (MLD) brief education on self MLD while pt performed in supine HOB elevated.  Pt and wife given handout with steps highlighted - omitted 7 and 11 due to size of chest and location of edema - vcs and instruction and then again in seated.                     PT Education - 06/25/21 1420     Education Details POC, chip pack use, self MLD initially    Person(s) Educated Patient;Spouse    Methods Explanation;Demonstration;Tactile cues;Verbal cues;Handout    Comprehension Verbalized understanding;Need further instruction;Returned demonstration;Verbal cues required;Tactile cues required                 PT Long Term Goals - 06/25/21 1430       PT LONG TERM GOAL #1   Title Pt will be ind with self MLD or use of flexitouch for head and neck lymphedema    Time 6    Period Weeks     Status New      PT LONG TERM GOAL #2   Title Pt will obtain appropriate compression garments for the neck    Time 6    Period Weeks    Status New      PT LONG TERM GOAL #3   Title Pt will decrease chin strap measurement at the largest point by at least 1cm    Time 6    Period Weeks    Status New                    Plan - 06/25/21 1424     Clinical Impression Statement Pt presents with submental edema, scar tissue limiting lymphatic movement, and the need for education on self care care for lymphedema post Rt neck dissection and radiation performed at [facility].  Pt will be establishing care here as he lives in [place].  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  Started MLD today and gave pt chip pack to start wearing.  pt will return 1x per week for 6 weeks to learn self MLD, obtain flexitouch if needed, and obtain appropriate compression garment.  Pt will also benefit from some STM/scar tissue work to soften the neck.  Personal Factors and Comorbidities Age;Comorbidity 2    Comorbidities ALND, radiation    Stability/Clinical Decision Making Stable/Uncomplicated    Clinical Decision Making Low    Rehab Potential Excellent    PT Frequency 1x / week    PT Duration 6 weeks    PT Treatment/Interventions ADLs/Self Care Home Management;Therapeutic exercise;Patient/family education;Manual techniques;Taping;Vasopneumatic Device    PT Next Visit Plan try self MLD? how was chip pack? solaris if chip pack was good enough and jovi pak if not enough coverage - show how to order,  Then review seated MLD and perform MLD with scar tissue release as needed    Recommended Other Services info sent to flexitouch 06/25/21    Consulted and Agree with Plan of Care Patient;Family member/caregiver             Patient will benefit from skilled therapeutic intervention in order to improve the following deficits and impairments:   Increased edema, Decreased scar mobility  Visit Diagnosis: Aftercare following surgery for neoplasm  Disorder of the skin and subcutaneous tissue related to radiation, unspecified  Lymphedema, not elsewhere classified     Problem List Patient Active Problem List   Diagnosis Date Noted   NSTEMI (non-ST elevated myocardial infarction) ([place]) 07/16/2019   Atypical nevus of shoulder 12/17/2014   Primary localized osteoarthritis of right knee 08/22/2014   Primary osteoarthritis of right knee 08/22/2014   First degree AV block 02/04/2014   GERD (gastroesophageal reflux disease) 12/06/2013   Blindness of right eye 12/04/2013   Total knee replacement status 09/18/2013   Knee pain, bilateral 11/30/2012   Foreign body granuloma of soft tissue, NEC, right lower leg 11/30/2012   Edema 10/25/2011   Coronary atherosclerosis 06/26/2009   RENAL CALCULUS, RECURRENT 01/03/2009   BPH (benign prostatic hypertrophy) 05/30/2007   Hypothyroidism 04/07/2007   Type 2 diabetes mellitus with complication ([place]) 23/53/6144   Hyperlipidemia 04/07/2007   Gout 04/07/2007   Essential hypertension 04/07/2007   DERMATITIS, CNTCT, ACUTE D/T SOLAR RADIATION 04/07/2007   HEPATITIS B, HX OF 04/07/2007    Stark Bray, PT 06/25/2021, 2:32 PM  [facility] @ [place], [place], [address] Phone: [phone]   Fax:  [phone]  Name: Jonathan [name] MRN: [id number] Date of Birth: Feb 05, 1936

## 2021-06-25 NOTE — Addendum Note (Signed)
Addended by: Stark Bray on: 06/25/2021 02:33 PM   Modules accepted: Orders

## 2021-06-26 ENCOUNTER — Encounter: Payer: Self-pay | Admitting: *Deleted

## 2021-06-26 ENCOUNTER — Inpatient Hospital Stay: Payer: Medicare Other | Attending: Oncology | Admitting: Oncology

## 2021-06-26 VITALS — BP 138/63 | HR 60 | Temp 98.1°F | Resp 18 | Ht 74.0 in | Wt 223.2 lb

## 2021-06-26 DIAGNOSIS — C4442 Squamous cell carcinoma of skin of scalp and neck: Secondary | ICD-10-CM

## 2021-06-26 DIAGNOSIS — Z96653 Presence of artificial knee joint, bilateral: Secondary | ICD-10-CM

## 2021-06-26 DIAGNOSIS — I2510 Atherosclerotic heart disease of native coronary artery without angina pectoris: Secondary | ICD-10-CM

## 2021-06-26 DIAGNOSIS — Z87891 Personal history of nicotine dependence: Secondary | ICD-10-CM

## 2021-06-26 DIAGNOSIS — E119 Type 2 diabetes mellitus without complications: Secondary | ICD-10-CM | POA: Diagnosis not present

## 2021-06-26 DIAGNOSIS — R32 Unspecified urinary incontinence: Secondary | ICD-10-CM

## 2021-06-26 NOTE — Progress Notes (Addendum)
[facility] New Patient Consult   Requesting MD: Danella Sensing, [place],  [place] [address]   Jonathan [name] 85 y.o.  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  He saw Dr. Redmond Baseman on 10/17/2020.  A 3 cm right submandibular mass was noted.  There was mild tenderness.  He was prescribed clindamycin and massage.  He was felt to have a right submandibular gland infection.  The mass did not improve with antibiotics.  A CT of the neck on 11/07/2020 revealed a 3.2 cm centrally necrotic lesion adjacent to or contiguous with right submandibular gland.  No other enlarged lymph nodes. Dr. Redmond Baseman performed FNA biopsy of the mass on 11/10/2020.  The cytology revealed squamous cell carcinoma.  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 Blenda Mounts.  A PET scan on 11/25/2020 revealed an FDG avid mass in the right submandibular region either arising from or involving the submandibular gram.  No other evidence of FDG avid tumor. He was taken the operating room on 12/04/2020 for a right level 1-4 neck dissection.  He is found to have a firm right submandibular mass associated with the right submandibular gland with a small remnant of normal gland.  No bone involvement.  The marginal mandibular nerve was involved in the mass and was sacrificed.  The pathology revealed squamous cell carcinoma involving submandibular gland, lymphoid, and soft tissue.  Perineural invasion was present.  Resection margins are negative.  A metastasis from a cutaneous or oral site was favored.  Multiple lymph nodes were negative for tumor.  He completed adjuvant radiation from 01/16/2021 through 02/26/2021 to a dose of 6000 cGy given in 30 fractions. He was seen at [facility] in September.  An MRI of the head and neck on 05/04/2021 revealed evolving postsurgical/treatment changes with no evidence of  recurrent tumor.  No lymphadenopathy.  A PET scan on 04/28/2021 revealed interval resection of the right submandibular tumor.  No new or suspicious progressive lesions.  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  He reports a squamous cell carcinoma with biopsy at the mid anterior chest in September and he underwent a wide excision procedure last week.  He had a lesion at the left lip biopsied in September as well with pathology confirming in situ squamous cell carcinoma.       Past Medical History:  Diagnosis Date   Benign prostatic hypertrophy    CAD (coronary artery disease)    CABG 2005 by Dr Cyndia Bent   DJD (degenerative joint disease)    bilateral knee, gout   GERD (gastroesophageal reflux disease)    HX OF   Glomerulonephritis acute    at age 72, due to beta strep, resolved, renal calculi- passed spontaneously   Gout    Hepatitis A 1950   Hepatitis B 1980's   History of kidney stones    3-4 TIMES SINCE 1970 PASSED ON OWN   HOH (hard of hearing)    wears hearing aids   Hyperlipidemia    Hypertension    Hypothyroidism    Incontinence of urine    Legally blind in right eye, as defined in [place]    Personal history of other infectious and parasitic disease    Sleep apnea 1990's   "before uvulectomy"   Squamous cell carcinoma    "lots on my arms &  face" (09/21/2013)   Sun-damaged skin    Tuberculosis    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 IN MED S[facility]OL IN 1961[facility] HAD TB   Type II diabetes mellitus ([place])     .  Melanoma in situ left mid medial knee 08/05/2017   .  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 07/19/2016  Past Surgical History:  Procedure Laterality Date   CARDIAC CATHETERIZATION     CATARACT EXTRACTION W/ INTRAOCULAR LENS  IMPLANT, BILATERAL Bilateral    CORONARY ARTERY BYPASS GRAFT  01/23/04   Dr Cyndia Bent   CORONARY STENT INTERVENTION  07/17/2019   CORONARY STENT INTERVENTION N/A 07/17/2019   Procedure: CORONARY STENT INTERVENTION;  Surgeon: Martinique, Peter  M, MD;  Location: [facility];  Service: Cardiovascular;  Laterality: N/A;   CYSTOSCOPY WITH INSERTION OF UROLIFT N/A 06/23/2017   Procedure: CYSTOSCOPY WITH INSERTION OF UROLIFT;  Surgeon: Franchot Gallo, MD;  [facility];  Service: Urology;  Laterality: N/A;   EYE SURGERY Right    detached retina ,blind in right eye   INGUINAL HERNIA REPAIR Bilateral 1980's - 1990's   LEFT HEART CATH AND CORONARY ANGIOGRAPHY N/A 07/17/2019   Procedure: LEFT HEART CATH AND CORONARY ANGIOGRAPHY;  Surgeon: Martinique, Peter M, MD;  Location: [facility];  Service: Cardiovascular;  Laterality: N/A;   MASTOIDECTOMY Right 1943   QUADRICEPS REPAIR Right 2009   RETINAL DETACHMENT SURGERY Right X 2   SEPSIS  1943   TONSILLECTOMY  ~ 1943   TOTAL KNEE ARTHROPLASTY Left 09/18/2013   Procedure: TOTAL KNEE ARTHROPLASTY;  Surgeon: Hessie Dibble, MD;  Location: [facility];  Service: Orthopedics;  Laterality: Left;   TOTAL KNEE ARTHROPLASTY Right 08/22/2014   Procedure: RIGHT TOTAL KNEE ARTHROPLASTY;  Surgeon: Hessie Dibble, MD;  Location: [facility];  Service: Orthopedics;  Laterality: Right;   UMBILICAL HERNIA REPAIR     UVULOPALATOPLASTY  1990's   ENT MD DR Rock Nephew    .  Left second toe amputation  Medications: Reviewed  Allergies:  Allergies  Allergen Reactions   Bee Venom Anaphylaxis    Family history: Mother had non-Hodgkin's lymphoma.  A sister died of osteosarcoma of the knee at age 17  Social History:   He lives with his wife at [facility].  He is a retired Magazine features editor.  He smokes cigarettes between [place] and 1967.  He reports social alcohol use.  No transfusion history.  He had hepatitis A in the 1950s and hepatitis B in 1980  ROS:   Positives include: 30 pound weight loss following radiation, neck lymphedema, mucositis during the course of radiation, dry mouth following radiation, altered taste following radiation, partial right face paralysis following  surgery  A complete ROS was otherwise negative.  Physical Exam:  Blood pressure 138/63, pulse 60, temperature 98.1 F (36.7 C), temperature source Oral, resp. rate 18, height 6\' 2"  (1.88 m), weight 223 lb 3.2 oz (101.2 kg), SpO2 100 %.  HEENT: The oral cavity is dry, no visible mass, neck without mass, lymphedema changes in the submandibular area and submental region Lungs: Clear bilaterally Cardiac: Regular rate and rhythm Abdomen: No hepatosplenomegaly  Vascular: No leg edema Lymph nodes: No cervical, supraclavicular, axillary, or inguinal nodes Neurologic: Alert and oriented Skin: Multiple benign-appearing moles over the head and trunk.  Left popliteal scar without evidence of recurrent tumor, healing incision with sutures in place at the mid anterior chest, right submandibular scar without evidence of recurrent tumor Musculoskeletal: No spine  tenderness   LAB:  CBC  Lab Results  Component Value Date   WBC 4.9 07/18/2019   HGB 14.2 07/18/2019   HCT 41.1 07/18/2019   MCV 92.6 07/18/2019   PLT 154 07/18/2019   NEUTROABS 3.1 05/08/2018        CMP  Lab Results  Component Value Date   NA 139 07/18/2019   K 3.8 07/18/2019   CL 105 07/18/2019   CO2 23 07/18/2019   GLUCOSE 109 (H) 07/18/2019   BUN 11 07/18/2019   CREATININE 0.91 07/18/2019   CALCIUM 8.8 (L) 07/18/2019   PROT 6.4 05/08/2018   ALBUMIN 4.3 05/08/2018   AST 20 05/08/2018   ALT 13 05/08/2018   ALKPHOS 49 05/08/2018   BILITOT 0.7 05/08/2018   GFRNONAA >60 07/18/2019   GFRAA >60 07/18/2019      Imaging:  As per HPI    Assessment/Plan:   Squamous cell carcinoma involving the right submandibular gland/lymphoid tissue CT neck 11/07/2020 -3.2 cm centrally necrotic lesion adjacent to a continuous with the right submandibular gland FNA biopsy 11/10/2020-squamous of carcinoma PET 11/25/2020-FDG avid mass in the right submandibular region arising from or directly involve the right submandibular gland, no  other evidence of FDG avid tumor 12/04/2020-right level 1-4 node dissection, squamous cell carcinoma involving the submandibular gland, lymphoid, and soft tissue-perineural invasion, multiple negative lymph nodes.  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  He appears to have metastatic disease involving the submandibular gland from an occult head and neck or skin primary.  The measurable tumor has been resected.  The plan is to follow him with clinical surveillance.  He underwent repeat surveillance imaging in September including a neck MRI and PET scan.  I do not recommend further imaging at present.  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  He will continue follow-up with Dr. Ronnald Ramp for management and surveillance of multiple primary skin cancers.  Betsy Coder, MD  06/26/2021, 3:44 PM

## 2021-06-26 NOTE — Progress Notes (Signed)
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 He was treated with surgery and radiation at [facility] and completed treatment in September. He is establishing care here and is also followed by dermatology for his Squamous cell carcinoma.  Given contact information to call with any issues or questions. Follow up with Dr Benay Spice in 3 months    Time spent counseling/coordinating care: > 60 minutes

## 2021-06-29 ENCOUNTER — Encounter: Payer: Self-pay | Admitting: Physical Therapy

## 2021-06-29 ENCOUNTER — Ambulatory Visit: Payer: Medicare Other | Admitting: Physical Therapy

## 2021-06-29 ENCOUNTER — Other Ambulatory Visit: Payer: Self-pay

## 2021-06-29 DIAGNOSIS — L599 Disorder of the skin and subcutaneous tissue related to radiation, unspecified: Secondary | ICD-10-CM

## 2021-06-29 DIAGNOSIS — Z483 Aftercare following surgery for neoplasm: Secondary | ICD-10-CM

## 2021-06-29 DIAGNOSIS — I890 Lymphedema, not elsewhere classified: Secondary | ICD-10-CM

## 2021-06-29 NOTE — Therapy (Signed)
[facility] @ [place], [place], [address] Phone: [phone]   Fax:  [phone]  Physical Therapy Treatment  Patient Details  Name: Jonathan [name] MRN: [id number] Date of Birth: 23-Nov-1935 Referring Provider (PT): Francine Graven NP   Encounter Date: 06/29/2021   PT End of Session - 06/29/21 1600     Visit Number 2    Number of Visits 6    Date for PT Re-Evaluation 08/06/21    PT Start Time 1450    PT Stop Time 1545    PT Time Calculation (min) 55 min    Activity Tolerance Patient tolerated treatment well    Behavior During Therapy [facility] for tasks assessed/performed             Past Medical History:  Diagnosis Date   Benign prostatic hypertrophy    CAD (coronary artery disease)    CABG 2005 by Dr Cyndia Bent   DJD (degenerative joint disease)    bilateral knee, gout   GERD (gastroesophageal reflux disease)    HX OF   Glomerulonephritis acute    at age 6, due to beta strep, resolved, renal calculi- passed spontaneously   Gout    Hepatitis A 1950   Hepatitis B 1980's   History of kidney stones    3-4 TIMES SINCE 1970 PASSED ON OWN   HOH (hard of hearing)    wears hearing aids   Hyperlipidemia    Hypertension    Hypothyroidism    Incontinence of urine    Legally blind in right eye, as defined in [place]    Personal history of other infectious and parasitic disease    Sleep apnea 1990's   "before uvulectomy"   Squamous cell carcinoma    "lots on my arms & face" (09/21/2013)   Sun-damaged skin    Tuberculosis    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 IN MED SCHOOL IN 1961[facility] HAD TB   Type II diabetes mellitus ([place])     Past Surgical History:  Procedure Laterality Date   CARDIAC CATHETERIZATION     CATARACT EXTRACTION W/ INTRAOCULAR LENS  IMPLANT, BILATERAL Bilateral    CORONARY ARTERY BYPASS GRAFT  01/23/04   Dr Cyndia Bent   CORONARY STENT INTERVENTION  07/17/2019   CORONARY STENT INTERVENTION N/A 07/17/2019    Procedure: CORONARY STENT INTERVENTION;  Surgeon: Martinique, Peter M, MD;  Location: [facility];  Service: Cardiovascular;  Laterality: N/A;   CYSTOSCOPY WITH INSERTION OF UROLIFT N/A 06/23/2017   Procedure: CYSTOSCOPY WITH INSERTION OF UROLIFT;  Surgeon: Franchot Gallo, MD;  Location: [facility];  Service: Urology;  Laterality: N/A;   EYE SURGERY Right    detached retina ,blind in right eye   INGUINAL HERNIA REPAIR Bilateral 1980's - 1990's   LEFT HEART CATH AND CORONARY ANGIOGRAPHY N/A 07/17/2019   Procedure: LEFT HEART CATH AND CORONARY ANGIOGRAPHY;  Surgeon: Martinique, Peter M, MD;  Location: [facility];  Service: Cardiovascular;  Laterality: N/A;   MASTOIDECTOMY Right 1943   QUADRICEPS REPAIR Right 2009   RETINAL DETACHMENT SURGERY Right X 2   SEPSIS  1943   TONSILLECTOMY  ~ 1943   TOTAL KNEE ARTHROPLASTY Left 09/18/2013   Procedure: TOTAL KNEE ARTHROPLASTY;  Surgeon: Hessie Dibble, MD;  Location: [facility];  Service: Orthopedics;  Laterality: Left;   TOTAL KNEE ARTHROPLASTY Right 08/22/2014   Procedure: RIGHT TOTAL KNEE ARTHROPLASTY;  Surgeon: Hessie Dibble, MD;  Location: [facility];  Service: Orthopedics;  Laterality: Right;   UMBILICAL HERNIA REPAIR     UVULOPALATOPLASTY  1990's   ENT MD DR Rock Nephew    There were no vitals filed for this visit.   Subjective Assessment - 06/29/21 1452     Subjective I have been sleeping in the chip pack and I have been doing the massage.    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 On 12/04/20 patient underwent right level 1-4 neck dissection with pathology showing a 3.5cm tumor in R level Ib, negative margins, PNI+. 0/21 LN involved Marginal mandibular nerve encountered going directly into the mass and was sacrificed. Patient completed PORT (unilateral neck field) from 01/16/21-02/26/21. More recently patient was seen by his local dermatologist for 2 new lesions that were biopsied: one lesion  was to the midline chest revealing well differentiated squamous cell carcinoma (plan for Moh's) and the other lesion was to the left lower lip revealing squamous carcinoma in situ. Hx of DM of CABG    Patient Stated Goals what to do for the swelling    Currently in Pain? No/denies                               [facility] Adult PT Treatment/Exercise - 06/29/21 0001       Manual Therapy   Manual Lymphatic Drainage (MLD) in supine with head elevated: short neck, 5 diaphragmatic breaths, bilateral axillary nodes, bilateral pectoral nodes, short neck, posterior, lateral and anterior neck with focus on submental area then retracing all steps while educating pt throughout on anatomy and physiology of the lymphatic system                          PT Long Term Goals - 06/25/21 1430       PT LONG TERM GOAL #1   Title Pt will be ind with self MLD or use of flexitouch for head and neck lymphedema    Time 6    Period Weeks    Status New      PT LONG TERM GOAL #2   Title Pt will obtain appropriate compression garments for the neck    Time 6    Period Weeks    Status New      PT LONG TERM GOAL #3   Title Pt will decrease chin strap measurement at the largest point by at least 1cm    Time 6    Period Weeks    Status New                   Plan - 06/29/21 1601     Clinical Impression Statement Continued with MLD to anterior neck today and educated pt on anatomy and physiology of the lymphatic system and why light pressure and proper skin stretch are vital. Pt verbalized understanding. He reports he has been doing self MLD and wearing the chip pack. Educated pt that the best time to wear the chip pack is after performing MLD. Pt plans on ordering the solaris head and neck garment.    PT Frequency 1x / week    PT Duration 6 weeks    PT Treatment/Interventions ADLs/Self Care Home Management;Therapeutic exercise;Patient/family education;Manual  techniques;Taping;Vasopneumatic Device    PT Next Visit Plan assess indep with self mLD,  solaris if chip pack was good enough and jovi pak if not enough coverage - show how to  order,  Then review seated MLD and perform MLD with scar tissue release as needed    PT Home Exercise Plan self MLD, chip pack    Consulted and Agree with Plan of Care Patient             Patient will benefit from skilled therapeutic intervention in order to improve the following deficits and impairments:  Increased edema, Decreased scar mobility  Visit Diagnosis: Lymphedema, not elsewhere classified  Aftercare following surgery for neoplasm  Disorder of the skin and subcutaneous tissue related to radiation, unspecified     Problem List Patient Active Problem List   Diagnosis Date Noted   NSTEMI (non-ST elevated myocardial infarction) ([facility]) 07/16/2019   Atypical nevus of shoulder 12/17/2014   Primary localized osteoarthritis of right knee 08/22/2014   Primary osteoarthritis of right knee 08/22/2014   First degree AV block 02/04/2014   GERD (gastroesophageal reflux disease) 12/06/2013   Blindness of right eye 12/04/2013   Total knee replacement status 09/18/2013   Knee pain, bilateral 11/30/2012   Foreign body granuloma of soft tissue, NEC, right lower leg 11/30/2012   Edema 10/25/2011   Coronary atherosclerosis 06/26/2009   RENAL CALCULUS, RECURRENT 01/03/2009   BPH (benign prostatic hypertrophy) 05/30/2007   Hypothyroidism 04/07/2007   Type 2 diabetes mellitus with complication ([place]) 79/89/2119   Hyperlipidemia 04/07/2007   Gout 04/07/2007   Essential hypertension 04/07/2007   DERMATITIS, CNTCT, ACUTE D/T SOLAR RADIATION 04/07/2007   HEPATITIS B, HX OF 04/07/2007    Allyson Sabal Farwell, PT 06/29/2021, 4:07 PM  [facility] @ [place], [place], [address] Phone: [phone]   Fax:  [phone]  Name: Jonathan [name] MRN: [id number] Date of Birth: Oct 31, 1935   Manus Gunning, PT 06/29/21 4:07 PM

## 2021-07-06 ENCOUNTER — Encounter: Payer: Self-pay | Admitting: Physical Therapy

## 2021-07-06 ENCOUNTER — Ambulatory Visit: Payer: Medicare Other | Admitting: Physical Therapy

## 2021-07-06 ENCOUNTER — Other Ambulatory Visit: Payer: Self-pay

## 2021-07-06 DIAGNOSIS — Z483 Aftercare following surgery for neoplasm: Secondary | ICD-10-CM

## 2021-07-06 DIAGNOSIS — L599 Disorder of the skin and subcutaneous tissue related to radiation, unspecified: Secondary | ICD-10-CM

## 2021-07-06 DIAGNOSIS — I890 Lymphedema, not elsewhere classified: Secondary | ICD-10-CM

## 2021-07-06 NOTE — Therapy (Signed)
[facility] @ [place], [place], [address] Phone: [phone]   Fax:  [phone]  Physical Therapy Treatment  Patient Details  Name: Jonathan [name] MRN: [id number] Date of Birth: 1935/08/13 Referring Provider (PT): Francine Graven NP   Encounter Date: 07/06/2021   PT End of Session - 07/06/21 1606     Visit Number 3    Number of Visits 6    Date for PT Re-Evaluation 08/06/21    PT Start Time 23    PT Stop Time 24    PT Time Calculation (min) 56 min    Activity Tolerance Patient tolerated treatment well    Behavior During Therapy [facility] for tasks assessed/performed             Past Medical History:  Diagnosis Date   Benign prostatic hypertrophy    CAD (coronary artery disease)    CABG 2005 by Dr Cyndia Bent   DJD (degenerative joint disease)    bilateral knee, gout   GERD (gastroesophageal reflux disease)    HX OF   Glomerulonephritis acute    at age 42, due to beta strep, resolved, renal calculi- passed spontaneously   Gout    Hepatitis A 1950   Hepatitis B 1980's   History of kidney stones    3-4 TIMES SINCE 1970 PASSED ON OWN   HOH (hard of hearing)    wears hearing aids   Hyperlipidemia    Hypertension    Hypothyroidism    Incontinence of urine    Legally blind in right eye, as defined in [place]    Personal history of other infectious and parasitic disease    Sleep apnea 1990's   "before uvulectomy"   Squamous cell carcinoma    "lots on my arms & face" (09/21/2013)   Sun-damaged skin    Tuberculosis    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 IN MED SCHOOL IN 1961[place] HAD TB   Type II diabetes mellitus ([place])     Past Surgical History:  Procedure Laterality Date   CARDIAC CATHETERIZATION     CATARACT EXTRACTION W/ INTRAOCULAR LENS  IMPLANT, BILATERAL Bilateral    CORONARY ARTERY BYPASS GRAFT  01/23/04   Dr Cyndia Bent   CORONARY STENT INTERVENTION  07/17/2019   CORONARY STENT INTERVENTION N/A 07/17/2019    Procedure: CORONARY STENT INTERVENTION;  Surgeon: Martinique, Peter M, MD;  Location: [facility];  Service: Cardiovascular;  Laterality: N/A;   CYSTOSCOPY WITH INSERTION OF UROLIFT N/A 06/23/2017   Procedure: CYSTOSCOPY WITH INSERTION OF UROLIFT;  Surgeon: Franchot Gallo, MD;  Location: [facility];  Service: Urology;  Laterality: N/A;   EYE SURGERY Right    detached retina ,blind in right eye   INGUINAL HERNIA REPAIR Bilateral 1980's - 1990's   LEFT HEART CATH AND CORONARY ANGIOGRAPHY N/A 07/17/2019   Procedure: LEFT HEART CATH AND CORONARY ANGIOGRAPHY;  Surgeon: Martinique, Peter M, MD;  Location: [facility];  Service: Cardiovascular;  Laterality: N/A;   MASTOIDECTOMY Right 1943   QUADRICEPS REPAIR Right 2009   RETINAL DETACHMENT SURGERY Right X 2   SEPSIS  1943   TONSILLECTOMY  ~ 1943   TOTAL KNEE ARTHROPLASTY Left 09/18/2013   Procedure: TOTAL KNEE ARTHROPLASTY;  Surgeon: Hessie Dibble, MD;  Location: [facility];  Service: Orthopedics;  Laterality: Left;   TOTAL KNEE ARTHROPLASTY Right 08/22/2014   Procedure: RIGHT TOTAL KNEE ARTHROPLASTY;  Surgeon: Hessie Dibble, MD;  Location: [facility];  Service: Orthopedics;  Laterality: Right;   UMBILICAL HERNIA REPAIR     UVULOPALATOPLASTY  1990's   ENT MD DR Rock Nephew    There were no vitals filed for this visit.   Subjective Assessment - 07/06/21 1503     Subjective I think the self massage is going real good. I like the garment that you made and I can sleep all night in it.    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 On 12/04/20 patient underwent right level 1-4 neck dissection with pathology showing a 3.5cm tumor in R level Ib, negative margins, PNI+. 0/21 LN involved Marginal mandibular nerve encountered going directly into the mass and was sacrificed. Patient completed PORT (unilateral neck field) from 01/16/21-02/26/21. More recently patient was seen by his local dermatologist for 2 new  lesions that were biopsied: one lesion was to the midline chest revealing well differentiated squamous cell carcinoma (plan for Moh's) and the other lesion was to the left lower lip revealing squamous carcinoma in situ. Hx of DM of CABG    Patient Stated Goals what to do for the swelling    Currently in Pain? No/denies    Multiple Pain Sites No                               [facility] Adult PT Treatment/Exercise - 07/06/21 0001       Manual Therapy   Manual Lymphatic Drainage (MLD) in supine with head elevated: short neck, 5 diaphragmatic breaths, bilateral axillary nodes, bilateral pectoral nodes, short neck, posterior, lateral and anterior neck with focus on submental area then retracing all steps while educating pt throughout on anatomy and physiology of the lymphatic system                          PT Long Term Goals - 06/25/21 1430       PT LONG TERM GOAL #1   Title Pt will be ind with self MLD or use of flexitouch for head and neck lymphedema    Time 6    Period Weeks    Status New      PT LONG TERM GOAL #2   Title Pt will obtain appropriate compression garments for the neck    Time 6    Period Weeks    Status New      PT LONG TERM GOAL #3   Title Pt will decrease chin strap measurement at the largest point by at least 1cm    Time 6    Period Weeks    Status New                   Plan - 07/06/21 1606     Clinical Impression Statement Continued with manual lymphatic drainge to anterior neck today. Pt still has fullness at anterior neck. He has been wearing his chip pack and reports he is able to sleep in in. He also has been performing self manual lymphatic drainage. He did not have any questions regarding self MLD today. He states he no longer plans on ordering the [employer] head and neck garment since the chip pack has been so comfortable.    PT Frequency 1x / week    PT Duration 6 weeks    PT Treatment/Interventions ADLs/Self  Care Home Management;Therapeutic exercise;Patient/family education;Manual techniques;Taping;Vasopneumatic Device    PT Next Visit Plan assess indep with self  mLD,  Then review seated MLD and perform MLD with scar tissue release as needed    PT Home Exercise Plan self MLD, chip pack    Consulted and Agree with Plan of Care Patient             Patient will benefit from skilled therapeutic intervention in order to improve the following deficits and impairments:  Increased edema, Decreased scar mobility  Visit Diagnosis: Lymphedema, not elsewhere classified  Aftercare following surgery for neoplasm  Disorder of the skin and subcutaneous tissue related to radiation, unspecified     Problem List Patient Active Problem List   Diagnosis Date Noted   NSTEMI (non-ST elevated myocardial infarction) ([place]) 07/16/2019   Atypical nevus of shoulder 12/17/2014   Primary localized osteoarthritis of right knee 08/22/2014   Primary osteoarthritis of right knee 08/22/2014   First degree AV block 02/04/2014   GERD (gastroesophageal reflux disease) 12/06/2013   Blindness of right eye 12/04/2013   Total knee replacement status 09/18/2013   Knee pain, bilateral 11/30/2012   Foreign body granuloma of soft tissue, NEC, right lower leg 11/30/2012   Edema 10/25/2011   Coronary atherosclerosis 06/26/2009   RENAL CALCULUS, RECURRENT 01/03/2009   BPH (benign prostatic hypertrophy) 05/30/2007   Hypothyroidism 04/07/2007   Type 2 diabetes mellitus with complication ([place]) 17/40/8144   Hyperlipidemia 04/07/2007   Gout 04/07/2007   Essential hypertension 04/07/2007   DERMATITIS, CNTCT, ACUTE D/T SOLAR RADIATION 04/07/2007   HEPATITIS B, HX OF 04/07/2007    Allyson Sabal [place], PT 07/06/2021, 4:09 PM  [facility] @ [place], [place], [address] Phone: [phone]   Fax:  [phone]  Name: Jonathan [name] MRN: [id number] Date of  Birth: 02/12/1936   Manus Gunning, PT 07/06/21 4:09 PM

## 2021-07-13 ENCOUNTER — Other Ambulatory Visit: Payer: Self-pay

## 2021-07-13 ENCOUNTER — Encounter: Payer: Self-pay | Admitting: Rehabilitation

## 2021-07-13 ENCOUNTER — Ambulatory Visit: Payer: Medicare Other | Attending: Nurse Practitioner | Admitting: Rehabilitation

## 2021-07-13 DIAGNOSIS — L599 Disorder of the skin and subcutaneous tissue related to radiation, unspecified: Secondary | ICD-10-CM | POA: Diagnosis present

## 2021-07-13 DIAGNOSIS — Z483 Aftercare following surgery for neoplasm: Secondary | ICD-10-CM

## 2021-07-13 DIAGNOSIS — I890 Lymphedema, not elsewhere classified: Secondary | ICD-10-CM | POA: Diagnosis not present

## 2021-07-13 NOTE — Therapy (Signed)
[facility] @ [place], [place], [address] Phone: [phone]   Fax:  [phone]  Physical Therapy Treatment  Patient Details  Name: Jonathan [name] MRN: [id number] Date of Birth: 06-21-1936 Referring Provider (PT): Francine Graven NP   Encounter Date: 07/13/2021   PT End of Session - 07/13/21 1157     Visit Number 4    Number of Visits 6    PT Start Time 1100    PT Stop Time 25    PT Time Calculation (min) 55 min    Activity Tolerance Patient tolerated treatment well    Behavior During Therapy [facility] for tasks assessed/performed             Past Medical History:  Diagnosis Date   Benign prostatic hypertrophy    CAD (coronary artery disease)    CABG 2005 by Dr Cyndia Bent   DJD (degenerative joint disease)    bilateral knee, gout   GERD (gastroesophageal reflux disease)    HX OF   Glomerulonephritis acute    at age 74, due to beta strep, resolved, renal calculi- passed spontaneously   Gout    Hepatitis A 1950   Hepatitis B 1980's   History of kidney stones    3-4 TIMES SINCE 1970 PASSED ON OWN   HOH (hard of hearing)    wears hearing aids   Hyperlipidemia    Hypertension    Hypothyroidism    Incontinence of urine    Legally blind in right eye, as defined in [place]    Personal history of other infectious and parasitic disease    Sleep apnea 1990's   "before uvulectomy"   Squamous cell carcinoma    "lots on my arms & face" (09/21/2013)   Sun-damaged skin    Tuberculosis    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 IN MED SCHOOL IN 1961[place] HAD TB   Type II diabetes mellitus ([place])     Past Surgical History:  Procedure Laterality Date   CARDIAC CATHETERIZATION     CATARACT EXTRACTION W/ INTRAOCULAR LENS  IMPLANT, BILATERAL Bilateral    CORONARY ARTERY BYPASS GRAFT  01/23/04   Dr Cyndia Bent   CORONARY STENT INTERVENTION  07/17/2019   CORONARY STENT INTERVENTION N/A 07/17/2019   Procedure: CORONARY STENT  INTERVENTION;  Surgeon: Martinique, Peter M, MD;  Location: [facility];  Service: Cardiovascular;  Laterality: N/A;   CYSTOSCOPY WITH INSERTION OF UROLIFT N/A 06/23/2017   Procedure: CYSTOSCOPY WITH INSERTION OF UROLIFT;  Surgeon: Franchot Gallo, MD;  Location: [facility];  Service: Urology;  Laterality: N/A;   EYE SURGERY Right    detached retina ,blind in right eye   INGUINAL HERNIA REPAIR Bilateral 1980's - 1990's   LEFT HEART CATH AND CORONARY ANGIOGRAPHY N/A 07/17/2019   Procedure: LEFT HEART CATH AND CORONARY ANGIOGRAPHY;  Surgeon: Martinique, Peter M, MD;  Location: [facility];  Service: Cardiovascular;  Laterality: N/A;   MASTOIDECTOMY Right 1943   QUADRICEPS REPAIR Right 2009   RETINAL DETACHMENT SURGERY Right X 2   SEPSIS  1943   TONSILLECTOMY  ~ 1943   TOTAL KNEE ARTHROPLASTY Left 09/18/2013   Procedure: TOTAL KNEE ARTHROPLASTY;  Surgeon: Hessie Dibble, MD;  Location: [facility];  Service: Orthopedics;  Laterality: Left;   TOTAL KNEE ARTHROPLASTY Right 08/22/2014   Procedure: RIGHT TOTAL KNEE ARTHROPLASTY;  Surgeon: Hessie Dibble, MD;  Location: [facility];  Service: Orthopedics;  Laterality: Right;  UMBILICAL HERNIA REPAIR     UVULOPALATOPLASTY  1990's   ENT MD DR Rock Nephew    There were no vitals filed for this visit.   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 On 12/04/20 patient underwent right level 1-4 neck dissection with pathology showing a 3.5cm tumor in R level Ib, negative margins, PNI+. 0/21 LN involved Marginal mandibular nerve encountered going directly into the mass and was sacrificed. Patient completed PORT (unilateral neck field) from 01/16/21-02/26/21. More recently patient was seen by his local dermatologist for 2 new lesions that were biopsied: one lesion was to the midline chest revealing well differentiated squamous cell carcinoma (plan for  Moh's) and the other lesion was to the left lower lip revealing squamous carcinoma in situ. Hx of DM of CABG    Currently in Pain? No/denies                   LYMPHEDEMA/ONCOLOGY QUESTIONNAIRE - 07/13/21 0001       Head and Neck   Other 26                        [facility] Adult PT Treatment/Exercise - 07/13/21 0001       Manual Therapy   Manual Lymphatic Drainage (MLD) in supine with head elevated: short neck, 5 diaphragmatic breaths, bilateral axillary nodes, bilateral pectoral nodes, short neck, posterior, lateral and anterior neck with focus on submental area then retracing all steps while educating pt throughout on anatomy and physiology of the lymphatic system                          PT Long Term Goals - 06/25/21 1430       PT LONG TERM GOAL #1   Title Pt will be ind with self MLD or use of flexitouch for head and neck lymphedema    Time 6    Period Weeks    Status New      PT LONG TERM GOAL #2   Title Pt will obtain appropriate compression garments for the neck    Time 6    Period Weeks    Status New      PT LONG TERM GOAL #3   Title Pt will decrease chin strap measurement at the largest point by at least 1cm    Time 6    Period Weeks    Status New                   Plan - 07/13/21 1158     Clinical Impression Statement noted almost 3cm reduction in chin strap measurement today with pt pleased.  Continued POC    PT Frequency 1x / week    PT Duration 6 weeks    PT Treatment/Interventions ADLs/Self Care Home Management;Therapeutic exercise;Patient/family education;Manual techniques;Taping;Vasopneumatic Device             Patient will benefit from skilled therapeutic intervention in order to improve the following deficits and impairments:     Visit Diagnosis: Lymphedema, not elsewhere classified  Aftercare following surgery for neoplasm  Disorder of the skin and subcutaneous tissue related to radiation,  unspecified     Problem List Patient Active Problem List   Diagnosis Date Noted   NSTEMI (non-ST elevated myocardial infarction) ([place]) 07/16/2019   Atypical nevus of shoulder 12/17/2014  Primary localized osteoarthritis of right knee 08/22/2014   Primary osteoarthritis of right knee 08/22/2014   First degree AV block 02/04/2014   GERD (gastroesophageal reflux disease) 12/06/2013   Blindness of right eye 12/04/2013   Total knee replacement status 09/18/2013   Knee pain, bilateral 11/30/2012   Foreign body granuloma of soft tissue, NEC, right lower leg 11/30/2012   Edema 10/25/2011   Coronary atherosclerosis 06/26/2009   RENAL CALCULUS, RECURRENT 01/03/2009   BPH (benign prostatic hypertrophy) 05/30/2007   Hypothyroidism 04/07/2007   Type 2 diabetes mellitus with complication ([place]) 88/28/0034   Hyperlipidemia 04/07/2007   Gout 04/07/2007   Essential hypertension 04/07/2007   DERMATITIS, CNTCT, ACUTE D/T SOLAR RADIATION 04/07/2007   HEPATITIS B, HX OF 04/07/2007    Stark Bray, PT 07/13/2021, 12:00 PM  [facility] @ [place], [place], [address] Phone: [phone]   Fax:  [phone]  Name: Jonathan [name] MRN: [id number] Date of Birth: 10/27/1935

## 2021-07-20 ENCOUNTER — Encounter: Payer: Self-pay | Admitting: Rehabilitation

## 2021-07-20 ENCOUNTER — Ambulatory Visit: Payer: Medicare Other | Admitting: Rehabilitation

## 2021-07-20 ENCOUNTER — Other Ambulatory Visit: Payer: Self-pay

## 2021-07-20 DIAGNOSIS — I890 Lymphedema, not elsewhere classified: Secondary | ICD-10-CM

## 2021-07-20 DIAGNOSIS — Z483 Aftercare following surgery for neoplasm: Secondary | ICD-10-CM

## 2021-07-20 DIAGNOSIS — L599 Disorder of the skin and subcutaneous tissue related to radiation, unspecified: Secondary | ICD-10-CM

## 2021-07-20 NOTE — Therapy (Signed)
[facility] @ [place], [place], [address] Phone: [phone]   Fax:  [phone]  Physical Therapy Treatment  Patient Details  Name: Jonathan [name] MRN: [id number] Date of Birth: 1936-03-12 Referring Provider (PT): Francine Graven NP   Encounter Date: 07/20/2021   PT End of Session - 07/20/21 1152     Visit Number 5    Number of Visits 6    Date for PT Re-Evaluation 08/06/21    PT Start Time 1103    PT Stop Time 1152    PT Time Calculation (min) 49 min             Past Medical History:  Diagnosis Date   Benign prostatic hypertrophy    CAD (coronary artery disease)    CABG 2005 by Dr Cyndia Bent   DJD (degenerative joint disease)    bilateral knee, gout   GERD (gastroesophageal reflux disease)    HX OF   Glomerulonephritis acute    at age 73, due to beta strep, resolved, renal calculi- passed spontaneously   Gout    Hepatitis A 1950   Hepatitis B 1980's   History of kidney stones    3-4 TIMES SINCE 1970 PASSED ON OWN   HOH (hard of hearing)    wears hearing aids   Hyperlipidemia    Hypertension    Hypothyroidism    Incontinence of urine    Legally blind in right eye, as defined in [place]    Personal history of other infectious and parasitic disease    Sleep apnea 1990's   "before uvulectomy"   Squamous cell carcinoma    "lots on my arms & face" (09/21/2013)   Sun-damaged skin    Tuberculosis    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 IN MED SCHOOL IN 1961[place] HAD TB   Type II diabetes mellitus ([place])     Past Surgical History:  Procedure Laterality Date   CARDIAC CATHETERIZATION     CATARACT EXTRACTION W/ INTRAOCULAR LENS  IMPLANT, BILATERAL Bilateral    CORONARY ARTERY BYPASS GRAFT  01/23/04   Dr Cyndia Bent   CORONARY STENT INTERVENTION  07/17/2019   CORONARY STENT INTERVENTION N/A 07/17/2019   Procedure: CORONARY STENT INTERVENTION;  Surgeon: Martinique, Peter M, MD;  Location: [facility];  Service:  Cardiovascular;  Laterality: N/A;   CYSTOSCOPY WITH INSERTION OF UROLIFT N/A 06/23/2017   Procedure: CYSTOSCOPY WITH INSERTION OF UROLIFT;  Surgeon: Franchot Gallo, MD;  Location: [facility];  Service: Urology;  Laterality: N/A;   EYE SURGERY Right    detached retina ,blind in right eye   INGUINAL HERNIA REPAIR Bilateral 1980's - 1990's   LEFT HEART CATH AND CORONARY ANGIOGRAPHY N/A 07/17/2019   Procedure: LEFT HEART CATH AND CORONARY ANGIOGRAPHY;  Surgeon: Martinique, Peter M, MD;  Location: [facility];  Service: Cardiovascular;  Laterality: N/A;   MASTOIDECTOMY Right 1943   QUADRICEPS REPAIR Right 2009   RETINAL DETACHMENT SURGERY Right X 2   SEPSIS  1943   TONSILLECTOMY  ~ 1943   TOTAL KNEE ARTHROPLASTY Left 09/18/2013   Procedure: TOTAL KNEE ARTHROPLASTY;  Surgeon: Hessie Dibble, MD;  Location: [facility];  Service: Orthopedics;  Laterality: Left;   TOTAL KNEE ARTHROPLASTY Right 08/22/2014   Procedure: RIGHT TOTAL KNEE ARTHROPLASTY;  Surgeon: Hessie Dibble, MD;  Location: [facility];  Service: Orthopedics;  Laterality: Right;   UMBILICAL HERNIA REPAIR     UVULOPALATOPLASTY  1990's  ENT MD DR Rock Nephew    There were no vitals filed for this visit.   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 On 12/04/20 patient underwent right level 1-4 neck dissection with pathology showing a 3.5cm tumor in R level Ib, negative margins, PNI+. 0/21 LN involved Marginal mandibular nerve encountered going directly into the mass and was sacrificed. Patient completed PORT (unilateral neck field) from 01/16/21-02/26/21. More recently patient was seen by his local dermatologist for 2 new lesions that were biopsied: one lesion was to the midline chest revealing well differentiated squamous cell carcinoma (plan for Moh's) and the other lesion was to the left lower lip revealing squamous carcinoma in  situ. Hx of DM of CABG    Currently in Pain? No/denies                   LYMPHEDEMA/ONCOLOGY QUESTIONNAIRE - 07/20/21 0001       Head and Neck   Other 25.5                        [facility] Adult PT Treatment/Exercise - 07/20/21 0001       Manual Therapy   Manual therapy comments pt with left sided face redness and irritation today reports he had a chemical peel for some spots    Edema Management declines needing to review self massage    Manual Lymphatic Drainage (MLD) in supine with head elevated: short neck, 5 diaphragmatic breaths, bilateral axillary nodes, bilateral pectoral nodes, short neck, posterior, lateral and anterior neck with focus on submental area then retracing all steps while educating pt throughout on anatomy and physiology of the lymphatic system                          PT Long Term Goals - 06/25/21 1430       PT LONG TERM GOAL #1   Title Pt will be ind with self MLD or use of flexitouch for head and neck lymphedema    Time 6    Period Weeks    Status New      PT LONG TERM GOAL #2   Title Pt will obtain appropriate compression garments for the neck    Time 6    Period Weeks    Status New      PT LONG TERM GOAL #3   Title Pt will decrease chin strap measurement at the largest point by at least 1cm    Time 6    Period Weeks    Status New                   Plan - 07/20/21 1152     Clinical Impression Statement Another 0.5cm reduction in the chin strap measurement with this area feeling much softer. Pt has 2 more appts scheduled and will assess ind    PT Frequency 1x / week    PT Duration 6 weeks    PT Treatment/Interventions ADLs/Self Care Home Management;Therapeutic exercise;Patient/family education;Manual techniques;Taping;Vasopneumatic Device    PT Next Visit Plan assess indep with self mLD,  Then review seated MLD and perform MLD with scar tissue release as needed    Consulted and Agree with Plan of  Care Patient             Patient will benefit from skilled therapeutic intervention  in order to improve the following deficits and impairments:     Visit Diagnosis: Lymphedema, not elsewhere classified  Aftercare following surgery for neoplasm  Disorder of the skin and subcutaneous tissue related to radiation, unspecified     Problem List Patient Active Problem List   Diagnosis Date Noted   NSTEMI (non-ST elevated myocardial infarction) ([place]) 07/16/2019   Atypical nevus of shoulder 12/17/2014   Primary localized osteoarthritis of right knee 08/22/2014   Primary osteoarthritis of right knee 08/22/2014   First degree AV block 02/04/2014   GERD (gastroesophageal reflux disease) 12/06/2013   Blindness of right eye 12/04/2013   Total knee replacement status 09/18/2013   Knee pain, bilateral 11/30/2012   Foreign body granuloma of soft tissue, NEC, right lower leg 11/30/2012   Edema 10/25/2011   Coronary atherosclerosis 06/26/2009   RENAL CALCULUS, RECURRENT 01/03/2009   BPH (benign prostatic hypertrophy) 05/30/2007   Hypothyroidism 04/07/2007   Type 2 diabetes mellitus with complication ([place]) 35/67/0141   Hyperlipidemia 04/07/2007   Gout 04/07/2007   Essential hypertension 04/07/2007   DERMATITIS, CNTCT, ACUTE D/T SOLAR RADIATION 04/07/2007   HEPATITIS B, HX OF 04/07/2007    Stark Bray, PT 07/20/2021, 11:56 AM  [facility] @ [place], [place], [address] Phone: [phone]   Fax:  [phone]  Name: KASE SHUGHART MRN: [id number] Date of Birth: 1936-01-26

## 2021-07-27 ENCOUNTER — Ambulatory Visit: Payer: Medicare Other | Admitting: Rehabilitation

## 2021-07-27 ENCOUNTER — Encounter: Payer: Self-pay | Admitting: Rehabilitation

## 2021-07-27 ENCOUNTER — Other Ambulatory Visit: Payer: Self-pay

## 2021-07-27 DIAGNOSIS — Z483 Aftercare following surgery for neoplasm: Secondary | ICD-10-CM

## 2021-07-27 DIAGNOSIS — L599 Disorder of the skin and subcutaneous tissue related to radiation, unspecified: Secondary | ICD-10-CM

## 2021-07-27 DIAGNOSIS — I890 Lymphedema, not elsewhere classified: Secondary | ICD-10-CM | POA: Diagnosis not present

## 2021-07-27 NOTE — Therapy (Signed)
[place] @ [place], [place], [address] Phone: [phone]   Fax:  [phone]  Physical Therapy Treatment  Patient Details  Name: Jonathan [name] MRN: [id number] Date of Birth: 01-25-1936 Referring Provider (PT): Francine Graven NP   Encounter Date: 07/27/2021   PT End of Session - 07/27/21 1144     Visit Number 6    Number of Visits 6    Date for PT Re-Evaluation 08/06/21    PT Start Time 1100    PT Stop Time 7124    PT Time Calculation (min) 44 min    Activity Tolerance Patient tolerated treatment well    Behavior During Therapy [facility] for tasks assessed/performed             Past Medical History:  Diagnosis Date   Benign prostatic hypertrophy    CAD (coronary artery disease)    CABG 2005 by Dr Cyndia Bent   DJD (degenerative joint disease)    bilateral knee, gout   GERD (gastroesophageal reflux disease)    HX OF   Glomerulonephritis acute    at age 74, due to beta strep, resolved, renal calculi- passed spontaneously   Gout    Hepatitis A 1950   Hepatitis B 1980's   History of kidney stones    3-4 TIMES SINCE 1970 PASSED ON OWN   HOH (hard of hearing)    wears hearing aids   Hyperlipidemia    Hypertension    Hypothyroidism    Incontinence of urine    Legally blind in right eye, as defined in [place]    Personal history of other infectious and parasitic disease    Sleep apnea 1990's   "before uvulectomy"   Squamous cell carcinoma    "lots on my arms & face" (09/21/2013)   Sun-damaged skin    Tuberculosis    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 IN MED SCHOOL IN 1961[place] HAD TB   Type II diabetes mellitus ([place])     Past Surgical History:  Procedure Laterality Date   CARDIAC CATHETERIZATION     CATARACT EXTRACTION W/ INTRAOCULAR LENS  IMPLANT, BILATERAL Bilateral    CORONARY ARTERY BYPASS GRAFT  01/23/04   Dr Cyndia Bent   CORONARY STENT INTERVENTION  07/17/2019   CORONARY STENT INTERVENTION N/A 07/17/2019    Procedure: CORONARY STENT INTERVENTION;  Surgeon: Martinique, Peter M, MD;  Location: [place] [facility];  Service: Cardiovascular;  Laterality: N/A;   CYSTOSCOPY WITH INSERTION OF UROLIFT N/A 06/23/2017   Procedure: CYSTOSCOPY WITH INSERTION OF UROLIFT;  Surgeon: Franchot Gallo, MD;  Location: [facility];  Service: Urology;  Laterality: N/A;   EYE SURGERY Right    detached retina ,blind in right eye   INGUINAL HERNIA REPAIR Bilateral 1980's - 1990's   LEFT HEART CATH AND CORONARY ANGIOGRAPHY N/A 07/17/2019   Procedure: LEFT HEART CATH AND CORONARY ANGIOGRAPHY;  Surgeon: Martinique, Peter M, MD;  Location: [place] [facility];  Service: Cardiovascular;  Laterality: N/A;   MASTOIDECTOMY Right 1943   QUADRICEPS REPAIR Right 2009   RETINAL DETACHMENT SURGERY Right X 2   SEPSIS  1943   TONSILLECTOMY  ~ 1943   TOTAL KNEE ARTHROPLASTY Left 09/18/2013   Procedure: TOTAL KNEE ARTHROPLASTY;  Surgeon: Hessie Dibble, MD;  Location: [place];  Service: Orthopedics;  Laterality: Left;   TOTAL KNEE ARTHROPLASTY Right 08/22/2014   Procedure: RIGHT TOTAL KNEE ARTHROPLASTY;  Surgeon: Hessie Dibble, MD;  Location: [place];  Service: Orthopedics;  Laterality: Right;   UMBILICAL HERNIA REPAIR     UVULOPALATOPLASTY  1990's   ENT MD DR Rock Nephew    There were no vitals filed for this visit.   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 On 12/04/20 patient underwent right level 1-4 neck dissection with pathology showing a 3.5cm tumor in R level Ib, negative margins, PNI+. 0/21 LN involved Marginal mandibular nerve encountered going directly into the mass and was sacrificed. Patient completed PORT (unilateral neck field) from 01/16/21-02/26/21. More recently patient was seen by his local dermatologist for 2 new lesions that were biopsied: one lesion was to the midline chest revealing well differentiated  squamous cell carcinoma (plan for Moh's) and the other lesion was to the left lower lip revealing squamous carcinoma in situ. Hx of DM of CABG    Currently in Pain? No/denies                   LYMPHEDEMA/ONCOLOGY QUESTIONNAIRE - 07/27/21 0001       Head and Neck   6 cm superior to sternal notch around neck 43 cm    Other 25.5                        [facility] Adult PT Treatment/Exercise - 07/27/21 0001       Manual Therapy   Manual therapy comments declines checking self massage - gave pt [employer] garment ordering information    Manual Lymphatic Drainage (MLD) in supine with head elevated: short neck, 5 diaphragmatic breaths, bilateral axillary nodes, bilateral pectoral nodes, short neck, posterior, lateral and anterior neck with focus on submental area then retracing all steps while educating pt throughout on anatomy and physiology of the lymphatic system                          PT Long Term Goals - 06/25/21 1430       PT LONG TERM GOAL #1   Title Pt will be ind with self MLD or use of flexitouch for head and neck lymphedema    Time 6    Period Weeks    Status New      PT LONG TERM GOAL #2   Title Pt will obtain appropriate compression garments for the neck    Time 6    Period Weeks    Status New      PT LONG TERM GOAL #3   Title Pt will decrease chin strap measurement at the largest point by at least 1cm    Time 6    Period Weeks    Status New                   Plan - 07/27/21 1144     Clinical Impression Statement No further reduction noted today.  Pt may be plateauing in terms of reduction and pt feels ready for DC next visit.    PT Frequency 1x / week    PT Duration 6 weeks    PT Treatment/Interventions ADLs/Self Care Home Management;Therapeutic exercise;Patient/family education;Manual techniques;Taping;Vasopneumatic Device    PT Next Visit Plan DC visit    Consulted and Agree with Plan of Care Patient              Patient will benefit from skilled therapeutic intervention in order to improve the  following deficits and impairments:     Visit Diagnosis: Lymphedema, not elsewhere classified  Aftercare following surgery for neoplasm  Disorder of the skin and subcutaneous tissue related to radiation, unspecified     Problem List Patient Active Problem List   Diagnosis Date Noted   NSTEMI (non-ST elevated myocardial infarction) ([place]) 07/16/2019   Atypical nevus of shoulder 12/17/2014   Primary localized osteoarthritis of right knee 08/22/2014   Primary osteoarthritis of right knee 08/22/2014   First degree AV block 02/04/2014   GERD (gastroesophageal reflux disease) 12/06/2013   Blindness of right eye 12/04/2013   Total knee replacement status 09/18/2013   Knee pain, bilateral 11/30/2012   Foreign body granuloma of soft tissue, NEC, right lower leg 11/30/2012   Edema 10/25/2011   Coronary atherosclerosis 06/26/2009   RENAL CALCULUS, RECURRENT 01/03/2009   BPH (benign prostatic hypertrophy) 05/30/2007   Hypothyroidism 04/07/2007   Type 2 diabetes mellitus with complication ([place]) 81/19/1478   Hyperlipidemia 04/07/2007   Gout 04/07/2007   Essential hypertension 04/07/2007   DERMATITIS, CNTCT, ACUTE D/T SOLAR RADIATION 04/07/2007   HEPATITIS B, HX OF 04/07/2007    Stark Bray, PT 07/27/2021, 11:46 AM  [place] @ [place], [place], [address] Phone: [phone]   Fax:  [phone]  Name: GILMER KAMINSKY MRN: [id number] Date of Birth: [birth year]

## 2021-08-04 ENCOUNTER — Other Ambulatory Visit: Payer: Self-pay

## 2021-08-04 ENCOUNTER — Encounter: Payer: Self-pay | Admitting: Rehabilitation

## 2021-08-04 ENCOUNTER — Ambulatory Visit: Payer: Medicare Other | Admitting: Rehabilitation

## 2021-08-04 DIAGNOSIS — L599 Disorder of the skin and subcutaneous tissue related to radiation, unspecified: Secondary | ICD-10-CM

## 2021-08-04 DIAGNOSIS — Z483 Aftercare following surgery for neoplasm: Secondary | ICD-10-CM

## 2021-08-04 DIAGNOSIS — I890 Lymphedema, not elsewhere classified: Secondary | ICD-10-CM

## 2021-08-04 NOTE — Therapy (Signed)
[place] @ [place], [place], [address] Phone: [phone]   Fax:  [phone]  Physical Therapy Treatment  Patient Details  Name: Jonathan [name] MRN: [id number] Date of Birth: 10-19-1935 Referring Provider (PT): Francine Graven NP   Encounter Date: 08/04/2021   PT End of Session - 08/04/21 1143     Visit Number 7    Date for PT Re-Evaluation 08/06/21    PT Start Time 1100    PT Stop Time 1144    PT Time Calculation (min) 44 min    Activity Tolerance Patient tolerated treatment well    Behavior During Therapy [facility] for tasks assessed/performed             Past Medical History:  Diagnosis Date   Benign prostatic hypertrophy    CAD (coronary artery disease)    CABG 2005 by Dr Cyndia Bent   DJD (degenerative joint disease)    bilateral knee, gout   GERD (gastroesophageal reflux disease)    HX OF   Glomerulonephritis acute    at age 56, due to beta strep, resolved, renal calculi- passed spontaneously   Gout    Hepatitis A 1950   Hepatitis B 1980's   History of kidney stones    3-4 TIMES SINCE 1970 PASSED ON OWN   HOH (hard of hearing)    wears hearing aids   Hyperlipidemia    Hypertension    Hypothyroidism    Incontinence of urine    Legally blind in right eye, as defined in [place]    Personal history of other infectious and parasitic disease    Sleep apnea 1990's   "before uvulectomy"   Squamous cell carcinoma    "lots on my arms & face" (09/21/2013)   Sun-damaged skin    Tuberculosis    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 IN MED SCHOOL IN 1961[place] HAD TB   Type II diabetes mellitus ([place])     Past Surgical History:  Procedure Laterality Date   CARDIAC CATHETERIZATION     CATARACT EXTRACTION W/ INTRAOCULAR LENS  IMPLANT, BILATERAL Bilateral    CORONARY ARTERY BYPASS GRAFT  01/23/04   Dr Cyndia Bent   CORONARY STENT INTERVENTION  07/17/2019   CORONARY STENT INTERVENTION N/A 07/17/2019   Procedure: CORONARY  STENT INTERVENTION;  Surgeon: Martinique, Peter M, MD;  Location: [place] [facility];  Service: Cardiovascular;  Laterality: N/A;   CYSTOSCOPY WITH INSERTION OF UROLIFT N/A 06/23/2017   Procedure: CYSTOSCOPY WITH INSERTION OF UROLIFT;  Surgeon: Franchot Gallo, MD;  Location: [facility];  Service: Urology;  Laterality: N/A;   EYE SURGERY Right    detached retina ,blind in right eye   INGUINAL HERNIA REPAIR Bilateral 1980's - 1990's   LEFT HEART CATH AND CORONARY ANGIOGRAPHY N/A 07/17/2019   Procedure: LEFT HEART CATH AND CORONARY ANGIOGRAPHY;  Surgeon: Martinique, Peter M, MD;  Location: [place] [facility];  Service: Cardiovascular;  Laterality: N/A;   MASTOIDECTOMY Right 1943   QUADRICEPS REPAIR Right 2009   RETINAL DETACHMENT SURGERY Right X 2   SEPSIS  1943   TONSILLECTOMY  ~ 1943   TOTAL KNEE ARTHROPLASTY Left 09/18/2013   Procedure: TOTAL KNEE ARTHROPLASTY;  Surgeon: Hessie Dibble, MD;  Location: [place];  Service: Orthopedics;  Laterality: Left;   TOTAL KNEE ARTHROPLASTY Right 08/22/2014   Procedure: RIGHT TOTAL KNEE ARTHROPLASTY;  Surgeon: Hessie Dibble, MD;  Location: [place];  Service: Orthopedics;  Laterality: Right;  UMBILICAL HERNIA REPAIR     UVULOPALATOPLASTY  1990's   ENT MD DR Rock Nephew    There were no vitals filed for this visit.   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 On 12/04/20 patient underwent right level 1-4 neck dissection with pathology showing a 3.5cm tumor in R level Ib, negative margins, PNI+. 0/21 LN involved Marginal mandibular nerve encountered going directly into the mass and was sacrificed. Patient completed PORT (unilateral neck field) from 01/16/21-02/26/21. More recently patient was seen by his local dermatologist for 2 new lesions that were biopsied: one lesion was to the midline chest revealing well differentiated squamous cell  carcinoma (plan for Moh's) and the other lesion was to the left lower lip revealing squamous carcinoma in situ. Hx of DM of CABG    Currently in Pain? No/denies                   LYMPHEDEMA/ONCOLOGY QUESTIONNAIRE - 08/04/21 0001       Head and Neck   Other 25                        [facility] Adult PT Treatment/Exercise - 08/04/21 0001       Manual Therapy   Manual Lymphatic Drainage (MLD) in supine with head elevated: short neck, 5 diaphragmatic breaths, bilateral axillary nodes, bilateral pectoral nodes, short neck, posterior, lateral and anterior neck with focus on submental area then retracing all steps while educating pt throughout on anatomy and physiology of the lymphatic system                          PT Long Term Goals - 08/04/21 1145       PT LONG TERM GOAL #1   Title Pt will be ind with self MLD or use of flexitouch for head and neck lymphedema    Status Achieved      PT LONG TERM GOAL #2   Title Pt will obtain appropriate compression garments for the neck    Status Achieved      PT LONG TERM GOAL #3   Title Pt will decrease chin strap measurement at the largest point by at least 1cm    Status Achieved                   Plan - 08/04/21 1143     Clinical Impression Statement continued 0.5 cm reduction today noted.  Pt will return in a few months just to recheck    PT Frequency 1x / week    PT Duration 6 weeks    PT Treatment/Interventions ADLs/Self Care Home Management;Therapeutic exercise;Patient/family education;Manual techniques;Taping;Vasopneumatic Device    Consulted and Agree with Plan of Care Patient             Patient will benefit from skilled therapeutic intervention in order to improve the following deficits and impairments:     Visit Diagnosis: Lymphedema, not elsewhere classified  Aftercare following surgery for neoplasm  Disorder of the skin and subcutaneous tissue related to radiation,  unspecified     Problem List Patient Active Problem List   Diagnosis Date Noted   NSTEMI (non-ST elevated myocardial infarction) ([place]) 07/16/2019   Atypical nevus of shoulder 12/17/2014   Primary localized osteoarthritis of right knee 08/22/2014   Primary osteoarthritis of  right knee 08/22/2014   First degree AV block 02/04/2014   GERD (gastroesophageal reflux disease) 12/06/2013   Blindness of right eye 12/04/2013   Total knee replacement status 09/18/2013   Knee pain, bilateral 11/30/2012   Foreign body granuloma of soft tissue, NEC, right lower leg 11/30/2012   Edema 10/25/2011   Coronary atherosclerosis 06/26/2009   RENAL CALCULUS, RECURRENT 01/03/2009   BPH (benign prostatic hypertrophy) 05/30/2007   Hypothyroidism 04/07/2007   Type 2 diabetes mellitus with complication ([place]) 31/42/7670   Hyperlipidemia 04/07/2007   Gout 04/07/2007   Essential hypertension 04/07/2007   DERMATITIS, CNTCT, ACUTE D/T SOLAR RADIATION 04/07/2007   HEPATITIS B, HX OF 04/07/2007    Stark Bray, PT 08/04/2021, 11:46 AM  [facility] [place] [facility] @ [place], [place], [address] Phone: [phone]   Fax:  [phone]  Name: Jonathan [name] MRN: [id number] Date of Birth: January 28, 1936

## 2021-08-26 ENCOUNTER — Encounter (HOSPITAL_BASED_OUTPATIENT_CLINIC_OR_DEPARTMENT_OTHER): Payer: Self-pay | Admitting: Internal Medicine

## 2021-08-26 ENCOUNTER — Other Ambulatory Visit: Payer: Self-pay

## 2021-08-26 ENCOUNTER — Ambulatory Visit (INDEPENDENT_AMBULATORY_CARE_PROVIDER_SITE_OTHER): Payer: Medicare Other | Admitting: Internal Medicine

## 2021-08-26 VITALS — BP 98/60 | HR 47 | Ht 74.0 in | Wt 218.0 lb

## 2021-08-26 DIAGNOSIS — I25810 Atherosclerosis of coronary artery bypass graft(s) without angina pectoris: Secondary | ICD-10-CM

## 2021-08-26 DIAGNOSIS — E785 Hyperlipidemia, unspecified: Secondary | ICD-10-CM

## 2021-08-26 DIAGNOSIS — I10 Essential (primary) hypertension: Secondary | ICD-10-CM | POA: Diagnosis not present

## 2021-08-26 DIAGNOSIS — I440 Atrioventricular block, first degree: Secondary | ICD-10-CM | POA: Diagnosis not present

## 2021-08-26 DIAGNOSIS — I453 Trifascicular block: Secondary | ICD-10-CM

## 2021-08-26 LAB — CBC WITH DIFFERENTIAL/PLATELET
Basophils Absolute: 0 10*3/uL (ref 0.0–0.2)
Basos: 1 %
EOS (ABSOLUTE): 0.1 10*3/uL (ref 0.0–0.4)
Eos: 1 %
Hematocrit: 41.8 % (ref 37.5–51.0)
Hemoglobin: 14.4 g/dL (ref 13.0–17.7)
Immature Grans (Abs): 0 10*3/uL (ref 0.0–0.1)
Immature Granulocytes: 0 %
Lymphocytes Absolute: 1.3 10*3/uL (ref 0.7–3.1)
Lymphs: 19 %
MCH: 31.4 pg (ref 26.6–33.0)
MCHC: 34.4 g/dL (ref 31.5–35.7)
MCV: 91 fL (ref 79–97)
Monocytes Absolute: 0.8 10*3/uL (ref 0.1–0.9)
Monocytes: 12 %
Neutrophils Absolute: 4.4 10*3/uL (ref 1.4–7.0)
Neutrophils: 67 %
Platelets: 231 10*3/uL (ref 150–450)
RBC: 4.59 x10E6/uL (ref 4.14–5.80)
RDW: 14.2 % (ref 11.6–15.4)
WBC: 6.6 10*3/uL (ref 3.4–10.8)

## 2021-08-26 LAB — BASIC METABOLIC PANEL
BUN/Creatinine Ratio: 18 (ref 10–24)
BUN: 16 mg/dL (ref 8–27)
CO2: 23 mmol/L (ref 20–29)
Calcium: 10 mg/dL (ref 8.6–10.2)
Chloride: 100 mmol/L (ref 96–106)
Creatinine, Ser: 0.9 mg/dL (ref 0.76–1.27)
Glucose: 112 mg/dL — ABNORMAL HIGH (ref 70–99)
Potassium: 4.4 mmol/L (ref 3.5–5.2)
Sodium: 139 mmol/L (ref 134–144)
eGFR: 85 mL/min/{1.73_m2} (ref >59)

## 2021-08-26 NOTE — Progress Notes (Signed)
PCP: Dorena Cookey, MD (Inactive)   Primary EP: Dr Ronnald Ramp is a 86 y.o. male who presents today for routine electrophysiology followup.  Since last being seen in our clinic, the patient reports doing very well.  Today, he denies symptoms of palpitations, chest pain, lower extremity edema, dizziness, presyncope, or syncope.   + he has had some fatigue and occasional SOB.   The patient is otherwise without complaint today.   Past Medical History:  Diagnosis Date   Benign prostatic hypertrophy    CAD (coronary artery disease)    CABG 2005 by Dr Cyndia Bent   DJD (degenerative joint disease)    bilateral knee, gout   GERD (gastroesophageal reflux disease)    HX OF   Glomerulonephritis acute    at age 89, due to beta strep, resolved, renal calculi- passed spontaneously   Gout    Hepatitis A 1950   Hepatitis B 1980's   History of kidney stones    3-4 TIMES SINCE 1970 PASSED ON OWN   HOH (hard of hearing)    wears hearing aids   Hyperlipidemia    Hypertension    Hypothyroidism    Incontinence of urine    Legally blind in right eye, as defined in [place]    Personal history of other infectious and parasitic disease    Sleep apnea 1990's   "before uvulectomy"   Squamous cell carcinoma    "lots on my arms & face" (09/21/2013)   Sun-damaged skin    Tuberculosis    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 IN MED SCHOOL IN 1961[place] HAD TB   Type II diabetes mellitus ([place])    Past Surgical History:  Procedure Laterality Date   CARDIAC CATHETERIZATION     CATARACT EXTRACTION W/ INTRAOCULAR LENS  IMPLANT, BILATERAL Bilateral    CORONARY ARTERY BYPASS GRAFT  01/23/04   Dr Cyndia Bent   CORONARY STENT INTERVENTION  07/17/2019   CORONARY STENT INTERVENTION N/A 07/17/2019   Procedure: CORONARY STENT INTERVENTION;  Surgeon: Martinique, Peter M, MD;  Location: [place] [facility];  Service: Cardiovascular;  Laterality: N/A;   CYSTOSCOPY WITH INSERTION OF UROLIFT N/A 06/23/2017   Procedure:  CYSTOSCOPY WITH INSERTION OF UROLIFT;  Surgeon: Franchot Gallo, MD;  Location: [facility];  Service: Urology;  Laterality: N/A;   EYE SURGERY Right    detached retina ,blind in right eye   INGUINAL HERNIA REPAIR Bilateral 1980's - 1990's   LEFT HEART CATH AND CORONARY ANGIOGRAPHY N/A 07/17/2019   Procedure: LEFT HEART CATH AND CORONARY ANGIOGRAPHY;  Surgeon: Martinique, Peter M, MD;  Location: [place] [facility];  Service: Cardiovascular;  Laterality: N/A;   MASTOIDECTOMY Right 1943   QUADRICEPS REPAIR Right 2009   RETINAL DETACHMENT SURGERY Right X 2   SEPSIS  1943   TONSILLECTOMY  ~ 1943   TOTAL KNEE ARTHROPLASTY Left 09/18/2013   Procedure: TOTAL KNEE ARTHROPLASTY;  Surgeon: Hessie Dibble, MD;  Location: [facility];  Service: Orthopedics;  Laterality: Left;   TOTAL KNEE ARTHROPLASTY Right 08/22/2014   Procedure: RIGHT TOTAL KNEE ARTHROPLASTY;  Surgeon: Hessie Dibble, MD;  Location: [place];  Service: Orthopedics;  Laterality: Right;   UMBILICAL HERNIA REPAIR     UVULOPALATOPLASTY  1990's   ENT MD DR Rock Nephew    ROS- all systems are reviewed and negatives except as per HPI above  Current Outpatient Medications  Medication Sig Dispense Refill   allopurinol (ZYLOPRIM) 300 MG tablet Take  1 tablet (300 mg total) by mouth daily. 90 tablet 4   aspirin EC 81 MG tablet Take 81 mg by mouth daily.     benazepril-hydrochlorthiazide (LOTENSIN HCT) 20-25 MG tablet Take 1 tablet by mouth daily. 90 tablet 3   cetirizine (ZYRTEC) 10 MG tablet Take 10 mg by mouth daily as needed for allergies.     furosemide (LASIX) 20 MG tablet Take 1 tablet (20 mg total) by mouth daily as needed for fluid or edema. 100 tablet 3   glipiZIDE (GLUCOTROL) 5 MG tablet Take 1 tablet (5 mg total) by mouth 2 (two) times daily. 180 tablet 4   glucose blood (FREESTYLE LITE) test strip USE TWICE A DAY AS DIRECTED (E11.9) 200 each 1   insulin glargine (LANTUS) 100 UNIT/ML injection Inject 5-50 Units into the skin 2  (two) times daily. Per sliding scale     levothyroxine (SYNTHROID, LEVOTHROID) 100 MCG tablet Take 1 tablet (100 mcg total) by mouth every morning. 90 tablet 4   metFORMIN (GLUCOPHAGE) 1000 MG tablet Take 1 tablet (1,000 mg total) by mouth 2 (two) times daily with a meal. 200 tablet 4   Multiple Vitamin (MULTIVITAMIN PO) Take 1 tablet by mouth daily.       nitroGLYCERIN (NITROSTAT) 0.4 MG SL tablet Place 1 tablet (0.4 mg total) under the tongue every 5 (five) minutes x 3 doses as needed for chest pain. 25 tablet 2   Omega-3 Fatty Acids (FISH OIL) 1000 MG CAPS Take 1,000 mg by mouth daily.      omeprazole (PRILOSEC) 20 MG capsule Take 1 capsule (20 mg total) by mouth daily as needed (heartburn or acid reflux). (Patient taking differently: Take 20 mg by mouth daily.) 90 capsule 4   oxybutynin (DITROPAN XL) 15 MG 24 hr tablet Take 15 mg by mouth daily.     pioglitazone (ACTOS) 45 MG tablet Take 1 tablet (45 mg total) by mouth daily. 90 tablet 3   polycarbophil (FIBERCON) 625 MG tablet Take 1,250 mg by mouth 2 (two) times daily.      polyethylene glycol (MIRALAX / GLYCOLAX) packet Take 17 g by mouth daily as needed for moderate constipation.     Probiotic Product (ALIGN) 4 MG CAPS Take 1 capsule (4 mg total) by mouth daily. 90 capsule 3   simvastatin (ZOCOR) 40 MG tablet Take 1 tablet (40 mg total) by mouth at bedtime. 90 tablet 3   No current facility-administered medications for this visit.    Physical Exam: Vitals:   08/26/21 0926  BP: 98/60  Pulse: (!) 47  SpO2: 94%  Weight: 218 lb (98.9 kg)  Height: 6\' 2"  (1.88 m)     GEN- The patient is well appearing, alert and oriented x 3 today.   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 HTN Stable No change required today  3. 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  He has  additionally second degree AV block on ekg today.  He will likely progress to CHB.  I would therefore recommend pacemaker implantation.  This is not urgently required however we both agree that we should proceed electively.  Risks, benefits, alternatives to pacemaker implantation were discussed in detail with the patient today. The patient understands that the risks include but are not limited to bleeding, infection, pneumothorax, perforation, tamponade, vascular damage, renal failure, MI, stroke, death,  and lead dislodgement and wishes to proceed. We will therefore schedule the procedure at the next available time.    MDT has been requested for the procedure.  4. HL Continue simvastatin He has LFTs and lipids followed by primary care Labs 06/19/21 reviewed  Return in a year  Thompson Grayer MD, [facility] 08/26/2021 9:27 AM

## 2021-08-26 NOTE — Patient Instructions (Addendum)
Medication Instructions:  Your physician recommends that you continue on your current medications as directed. Please refer to the Current Medication list given to you today. *If you need a refill on your cardiac medications before your next appointment, please call your pharmacy*  Lab Work: You will get lab work today:  CBC and BMP If you have labs (blood work) drawn today and your tests are completely normal, you will receive your results only by: MyChart Message (if you have MyChart) OR A paper copy in the mail If you have any lab test that is abnormal or we need to change your treatment, we will call you to review the results.  Testing/Procedures: None ordered.  Follow-Up:  SEE INSTRUCTION LETTER  Pacemaker Implantation, Adult Pacemaker implantation is a procedure to place a pacemaker inside the chest. A pacemaker is a small computer that sends electrical signals to the heart and helps the heart beat normally. A pacemaker also stores information about heart rhythms. You may need pacemaker implantation if you have: A slow heartbeat (bradycardia). Loss of consciousness that happens repeatedly (syncope) or repeated episodes of dizziness or light-headedness because of an irregular heart rate. Shortness of breath (dyspnea) due to heart problems. The pacemaker usually attaches to your heart through a wire called a lead. One or two leads may be needed. There are different types of pacemakers: Transvenous pacemaker. This type is placed under the skin or muscle of your upper chest area. The lead goes through a vein in the chest area to reach the inside of the heart. Epicardial pacemaker. This type is placed under the skin or muscle of your chest or abdomen. The lead goes through your chest to the outside of the heart. Tell a health care provider about: Any allergies you have. All medicines you are taking, including vitamins, herbs, eye drops, creams, and over-the-counter medicines. Any  problems you or family members have had with anesthetic medicines. Any blood or bone disorders you have. Any surgeries you have had. Any medical conditions you have. Whether you are pregnant or may be pregnant. What are the risks? Generally, this is a safe procedure. However, problems may occur, including: Infection. Bleeding. Failure of the pacemaker or the lead. Collapse of a lung or bleeding into a lung. Blood clot inside a blood vessel with a lead. Damage to the heart. Infection inside the heart (endocarditis). Allergic reactions to medicines. What happens before the procedure? Staying hydrated Follow instructions from your health care provider about hydration, which may include: Up to 2 hours before the procedure - you may continue to drink clear liquids, such as water, clear fruit juice, black coffee, and plain tea.  Eating and drinking restrictions Follow instructions from your health care provider about eating and drinking, which may include: 8 hours before the procedure - stop eating heavy meals or foods, such as meat, fried foods, or fatty foods. 6 hours before the procedure - stop eating light meals or foods, such as toast or cereal. 6 hours before the procedure - stop drinking milk or drinks that contain milk. 2 hours before the procedure - stop drinking clear liquids. Medicines Ask your health care provider about: Changing or stopping your regular medicines. This is especially important if you are taking diabetes medicines or blood thinners. Taking medicines such as aspirin and ibuprofen. These medicines can thin your blood. Do not take these medicines unless your health care provider tells you to take them. Taking over-the-counter medicines, vitamins, herbs, and supplements. Tests You may have:  A heart evaluation. This may include: An electrocardiogram (ECG). This involves placing patches on your skin to check your heart rhythm. A chest X-ray. An echocardiogram. This  is a test that uses sound waves (ultrasound) to produce an image of the heart. A cardiac rhythm monitor. This is used to record your heart rhythm and any events for a longer period of time. Blood tests. Genetic testing. General instructions Do not use any products that contain nicotine or tobacco for at least 4 weeks before the procedure. These products include cigarettes, e-cigarettes, and chewing tobacco. If you need help quitting, ask your health care provider. Ask your health care provider: How your surgery site will be marked. What steps will be taken to help prevent infection. These steps may include: Removing hair at the surgery site. Washing skin with a germ-killing soap. Receiving antibiotic medicine. Plan to have someone take you home from the hospital or clinic. If you will be going home right after the procedure, plan to have someone with you for 24 hours. What happens during the procedure? An IV will be inserted into one of your veins. You will be given one or more of the following: A medicine to help you relax (sedative). A medicine to numb the area (local anesthetic). A medicine to make you fall asleep (general anesthetic). The next steps vary depending on the type of pacemaker you will be getting. If you are getting a transvenous pacemaker: An incision will be made in your upper chest. A pocket will be made for the pacemaker. It may be placed under the skin or between layers of muscle. The lead will be inserted into a blood vessel that goes to the heart. While X-rays are taken by an imaging machine (fluoroscopy), the lead will be advanced through the vein to the inside of your heart. The other end of the lead will be tunneled under the skin and attached to the pacemaker. If you are getting an epicardial pacemaker: An incision will be made near your ribs or breastbone (sternum) for the lead. The lead will be attached to the outside of your heart. Another incision will be  made in your chest or upper abdomen to create a pocket for the pacemaker. The free end of the lead will be tunneled under the skin and attached to the pacemaker. The transvenous or epicardial pacemaker will be tested. Imaging studies may be done to check the lead position. The incisions will be closed with stitches (sutures), adhesive strips, or skin glue. Bandages (dressings) will be placed over the incisions. The procedure may vary among health care providers and hospitals. What happens after the procedure? Your blood pressure, heart rate, breathing rate, and blood oxygen level will be monitored until you leave the hospital or clinic. You may be given antibiotics. You will be given pain medicine. An ECG and chest X-rays will be done. You may need to wear a continuous type of ECG (Holter monitor) to check your heart rhythm. Your health care provider will program the pacemaker. If you were given a sedative during the procedure, it can affect you for several hours. Do not drive or operate machinery until your health care provider says that it is safe. You will be given a pacemaker identification card. This card lists the implant date, device model, and manufacturer of your pacemaker. Summary A pacemaker is a small computer that sends electrical signals to the heart and helps the heart beat normally. There are different types of pacemakers. A pacemaker may be  placed under the skin or muscle of your chest or abdomen. Follow instructions from your health care provider about eating and drinking and about taking medicines before the procedure. This information is not intended to replace advice given to you by your health care provider. Make sure you discuss any questions you have with your health care provider. Document Revised: 04/07/2021 Document Reviewed: 06/27/2019 Elsevier Patient Education  2022 [employer].

## 2021-08-26 NOTE — H&P (View-Only) (Signed)
PCP: Dorena Cookey, MD (Inactive)   Primary EP: Dr Ronnald Ramp is a 86 y.o. male who presents today for routine electrophysiology followup.  Since last being seen in our clinic, the patient reports doing very well.  Today, he denies symptoms of palpitations, chest pain, lower extremity edema, dizziness, presyncope, or syncope.   + he has had some fatigue and occasional SOB.   The patient is otherwise without complaint today.   Past Medical History:  Diagnosis Date   Benign prostatic hypertrophy    CAD (coronary artery disease)    CABG 2005 by Dr Cyndia Bent   DJD (degenerative joint disease)    bilateral knee, gout   GERD (gastroesophageal reflux disease)    HX OF   Glomerulonephritis acute    at age 26, due to beta strep, resolved, renal calculi- passed spontaneously   Gout    Hepatitis A 1950   Hepatitis B 1980's   History of kidney stones    3-4 TIMES SINCE 1970 PASSED ON OWN   HOH (hard of hearing)    wears hearing aids   Hyperlipidemia    Hypertension    Hypothyroidism    Incontinence of urine    Legally blind in right eye, as defined in [place]    Personal history of other infectious and parasitic disease    Sleep apnea 1990's   "before uvulectomy"   Squamous cell carcinoma    "lots on my arms & face" (09/21/2013)   Sun-damaged skin    Tuberculosis    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 IN MED SCHOOL IN 1961[facility] HAD TB   Type II diabetes mellitus ([place])    Past Surgical History:  Procedure Laterality Date   CARDIAC CATHETERIZATION     CATARACT EXTRACTION W/ INTRAOCULAR LENS  IMPLANT, BILATERAL Bilateral    CORONARY ARTERY BYPASS GRAFT  01/23/04   Dr Cyndia Bent   CORONARY STENT INTERVENTION  07/17/2019   CORONARY STENT INTERVENTION N/A 07/17/2019   Procedure: CORONARY STENT INTERVENTION;  Surgeon: Martinique, Peter M, MD;  Location: [facility];  Service: Cardiovascular;  Laterality: N/A;   CYSTOSCOPY WITH INSERTION OF UROLIFT N/A 06/23/2017   Procedure:  CYSTOSCOPY WITH INSERTION OF UROLIFT;  Surgeon: Franchot Gallo, MD;  [facility];  Service: Urology;  Laterality: N/A;   EYE SURGERY Right    detached retina ,blind in right eye   INGUINAL HERNIA REPAIR Bilateral 1980's - 1990's   LEFT HEART CATH AND CORONARY ANGIOGRAPHY N/A 07/17/2019   Procedure: LEFT HEART CATH AND CORONARY ANGIOGRAPHY;  Surgeon: Martinique, Peter M, MD;  Location: [facility];  Service: Cardiovascular;  Laterality: N/A;   MASTOIDECTOMY Right 1943   QUADRICEPS REPAIR Right 2009   RETINAL DETACHMENT SURGERY Right X 2   SEPSIS  1943   TONSILLECTOMY  ~ 1943   TOTAL KNEE ARTHROPLASTY Left 09/18/2013   Procedure: TOTAL KNEE ARTHROPLASTY;  Surgeon: Hessie Dibble, MD;  Location: [facility];  Service: Orthopedics;  Laterality: Left;   TOTAL KNEE ARTHROPLASTY Right 08/22/2014   Procedure: RIGHT TOTAL KNEE ARTHROPLASTY;  Surgeon: Hessie Dibble, MD;  Location: [facility];  Service: Orthopedics;  Laterality: Right;   UMBILICAL HERNIA REPAIR     UVULOPALATOPLASTY  1990's   ENT MD DR Rock Nephew    ROS- all systems are reviewed and negatives except as per HPI above  Current Outpatient Medications  Medication Sig Dispense Refill   allopurinol (ZYLOPRIM) 300 MG tablet Take  1 tablet (300 mg total) by mouth daily. 90 tablet 4   aspirin EC 81 MG tablet Take 81 mg by mouth daily.     benazepril-hydrochlorthiazide (LOTENSIN HCT) 20-25 MG tablet Take 1 tablet by mouth daily. 90 tablet 3   cetirizine (ZYRTEC) 10 MG tablet Take 10 mg by mouth daily as needed for allergies.     furosemide (LASIX) 20 MG tablet Take 1 tablet (20 mg total) by mouth daily as needed for fluid or edema. 100 tablet 3   glipiZIDE (GLUCOTROL) 5 MG tablet Take 1 tablet (5 mg total) by mouth 2 (two) times daily. 180 tablet 4   glucose blood (FREESTYLE LITE) test strip USE TWICE A DAY AS DIRECTED (E11.9) 200 each 1   insulin glargine (LANTUS) 100 UNIT/ML injection Inject 5-50 Units into the skin 2  (two) times daily. Per sliding scale     levothyroxine (SYNTHROID, LEVOTHROID) 100 MCG tablet Take 1 tablet (100 mcg total) by mouth every morning. 90 tablet 4   metFORMIN (GLUCOPHAGE) 1000 MG tablet Take 1 tablet (1,000 mg total) by mouth 2 (two) times daily with a meal. 200 tablet 4   Multiple Vitamin (MULTIVITAMIN PO) Take 1 tablet by mouth daily.       nitroGLYCERIN (NITROSTAT) 0.4 MG SL tablet Place 1 tablet (0.4 mg total) under the tongue every 5 (five) minutes x 3 doses as needed for chest pain. 25 tablet 2   Omega-3 Fatty Acids (FISH OIL) 1000 MG CAPS Take 1,000 mg by mouth daily.      omeprazole (PRILOSEC) 20 MG capsule Take 1 capsule (20 mg total) by mouth daily as needed (heartburn or acid reflux). (Patient taking differently: Take 20 mg by mouth daily.) 90 capsule 4   oxybutynin (DITROPAN XL) 15 MG 24 hr tablet Take 15 mg by mouth daily.     pioglitazone (ACTOS) 45 MG tablet Take 1 tablet (45 mg total) by mouth daily. 90 tablet 3   polycarbophil (FIBERCON) 625 MG tablet Take 1,250 mg by mouth 2 (two) times daily.      polyethylene glycol (MIRALAX / GLYCOLAX) packet Take 17 g by mouth daily as needed for moderate constipation.     Probiotic Product (ALIGN) 4 MG CAPS Take 1 capsule (4 mg total) by mouth daily. 90 capsule 3   simvastatin (ZOCOR) 40 MG tablet Take 1 tablet (40 mg total) by mouth at bedtime. 90 tablet 3   No current facility-administered medications for this visit.    Physical Exam: Vitals:   08/26/21 0926  BP: 98/60  Pulse: (!) 47  SpO2: 94%  Weight: 218 lb (98.9 kg)  Height: 6\' 2"  (1.88 m)     GEN- The patient is well appearing, alert and oriented x 3 today.   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 HTN Stable No change required today  3. 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  He has  additionally second degree AV block on ekg today.  He will likely progress to CHB.  I would therefore recommend pacemaker implantation.  This is not urgently required however we both agree that we should proceed electively.  Risks, benefits, alternatives to pacemaker implantation were discussed in detail with the patient today. The patient understands that the risks include but are not limited to bleeding, infection, pneumothorax, perforation, tamponade, vascular damage, renal failure, MI, stroke, death,  and lead dislodgement and wishes to proceed. We will therefore schedule the procedure at the next available time.    MDT has been requested for the procedure.  4. HL Continue simvastatin He has LFTs and lipids followed by primary care Labs 06/19/21 reviewed  Return in a year  Thompson Grayer MD, [facility] 08/26/2021 9:27 AM

## 2021-09-03 NOTE — Pre-Procedure Instructions (Signed)
Instructed patient on the following items: Arrival time 0830 Nothing to eat or drink after midnight No meds AM of procedure Responsible person to drive you home and stay with you for 24 hrs Wash with special soap night before and morning of procedure  

## 2021-09-04 ENCOUNTER — Ambulatory Visit (HOSPITAL_COMMUNITY): Payer: Medicare Other

## 2021-09-04 ENCOUNTER — Ambulatory Visit (HOSPITAL_COMMUNITY)
Admission: RE | Disposition: A | Discharge status: Home / Self Care | Payer: Self-pay | Source: Home / Self Care | Attending: Internal Medicine

## 2021-09-04 ENCOUNTER — Ambulatory Visit (HOSPITAL_COMMUNITY)
Admission: RE | Admit: 2021-09-04 | Discharge: 2021-09-04 | Disposition: A | Discharge status: Home / Self Care | Payer: Medicare Other | Attending: Internal Medicine | Admitting: Internal Medicine

## 2021-09-04 ENCOUNTER — Other Ambulatory Visit: Payer: Self-pay

## 2021-09-04 DIAGNOSIS — Z794 Long term (current) use of insulin: Secondary | ICD-10-CM | POA: Insufficient documentation

## 2021-09-04 DIAGNOSIS — Z7984 Long term (current) use of oral hypoglycemic drugs: Secondary | ICD-10-CM | POA: Insufficient documentation

## 2021-09-04 DIAGNOSIS — I453 Trifascicular block: Secondary | ICD-10-CM | POA: Insufficient documentation

## 2021-09-04 DIAGNOSIS — I10 Essential (primary) hypertension: Secondary | ICD-10-CM | POA: Insufficient documentation

## 2021-09-04 DIAGNOSIS — Z79899 Other long term (current) drug therapy: Secondary | ICD-10-CM | POA: Diagnosis not present

## 2021-09-04 DIAGNOSIS — I2510 Atherosclerotic heart disease of native coronary artery without angina pectoris: Secondary | ICD-10-CM | POA: Insufficient documentation

## 2021-09-04 DIAGNOSIS — E785 Hyperlipidemia, unspecified: Secondary | ICD-10-CM | POA: Diagnosis not present

## 2021-09-04 DIAGNOSIS — Z950 Presence of cardiac pacemaker: Secondary | ICD-10-CM

## 2021-09-04 DIAGNOSIS — E119 Type 2 diabetes mellitus without complications: Secondary | ICD-10-CM | POA: Insufficient documentation

## 2021-09-04 DIAGNOSIS — I441 Atrioventricular block, second degree: Secondary | ICD-10-CM | POA: Diagnosis present

## 2021-09-04 HISTORY — PX: PACEMAKER IMPLANT: EP1218

## 2021-09-04 LAB — GLUCOSE, CAPILLARY
Glucose-Capillary: 86 mg/dL (ref 70–99)
Glucose-Capillary: 78 mg/dL (ref 70–99)

## 2021-09-04 SURGERY — PACEMAKER IMPLANT

## 2021-09-04 MED ORDER — MIDAZOLAM HCL 5 MG/5ML IJ SOLN
INTRAMUSCULAR | Status: AC
  Filled 2021-09-04: qty 5

## 2021-09-04 MED ORDER — LIDOCAINE HCL (PF) 1 % IJ SOLN
INTRAMUSCULAR | Status: DC | PRN
  Administered 2021-09-04: 40 mL
  Administered 2021-09-04: 20 mL

## 2021-09-04 MED ORDER — SODIUM CHLORIDE 0.9 % IV SOLN: INTRAVENOUS | Status: DC

## 2021-09-04 MED ORDER — HEPARIN (PORCINE) IN NACL 1000-0.9 UT/500ML-% IV SOLN
INTRAVENOUS | Status: AC
  Filled 2021-09-04: qty 500

## 2021-09-04 MED ORDER — CEFAZOLIN SODIUM-DEXTROSE 2-4 GM/100ML-% IV SOLN
2.0000 g | INTRAVENOUS | Status: AC
  Administered 2021-09-04: 2 g via INTRAVENOUS

## 2021-09-04 MED ORDER — LIDOCAINE HCL (PF) 1 % IJ SOLN
INTRAMUSCULAR | Status: AC
  Filled 2021-09-04: qty 60

## 2021-09-04 MED ORDER — SODIUM CHLORIDE 0.9 % IV SOLN: 250.0000 mL | INTRAVENOUS | Status: DC | PRN

## 2021-09-04 MED ORDER — ACETAMINOPHEN 325 MG PO TABS: 325.0000 mg | ORAL_TABLET | ORAL | Status: DC | PRN

## 2021-09-04 MED ORDER — LIDOCAINE HCL (PF) 1 % IJ SOLN
INTRAMUSCULAR | Status: AC
  Filled 2021-09-04: qty 30

## 2021-09-04 MED ORDER — SODIUM CHLORIDE 0.9% FLUSH: 3.0000 mL | INTRAVENOUS | Status: DC | PRN

## 2021-09-04 MED ORDER — SODIUM CHLORIDE 0.9 % IV SOLN
INTRAVENOUS | Status: AC
  Filled 2021-09-04: qty 2

## 2021-09-04 MED ORDER — CHLORHEXIDINE GLUCONATE 4 % EX LIQD: 4.0000 "application " | Freq: Once | CUTANEOUS | Status: DC

## 2021-09-04 MED ORDER — SODIUM CHLORIDE 0.9 % IV SOLN
80.0000 mg | INTRAVENOUS | Status: AC
  Administered 2021-09-04: 80 mg

## 2021-09-04 MED ORDER — ONDANSETRON HCL 4 MG/2ML IJ SOLN: 4.0000 mg | Freq: Four times a day (QID) | INTRAMUSCULAR | Status: DC | PRN

## 2021-09-04 MED ORDER — MIDAZOLAM HCL 5 MG/5ML IJ SOLN
INTRAMUSCULAR | Status: DC | PRN
  Administered 2021-09-04 (×2): 1 mg via INTRAVENOUS

## 2021-09-04 MED ORDER — POVIDONE-IODINE 10 % EX SWAB: 2.0000 "application " | Freq: Once | CUTANEOUS | Status: DC

## 2021-09-04 MED ORDER — HEPARIN (PORCINE) IN NACL 1000-0.9 UT/500ML-% IV SOLN
INTRAVENOUS | Status: DC | PRN
  Administered 2021-09-04: 500 mL

## 2021-09-04 MED ORDER — SODIUM CHLORIDE 0.9% FLUSH: 3.0000 mL | Freq: Two times a day (BID) | INTRAVENOUS | Status: DC

## 2021-09-04 MED ORDER — FENTANYL CITRATE (PF) 100 MCG/2ML IJ SOLN
INTRAMUSCULAR | Status: AC
  Filled 2021-09-04: qty 2

## 2021-09-04 MED ORDER — CEFAZOLIN SODIUM-DEXTROSE 2-4 GM/100ML-% IV SOLN
INTRAVENOUS | Status: AC
  Filled 2021-09-04: qty 100

## 2021-09-04 SURGICAL SUPPLY — 13 items
CABLE SURGICAL S-101-97-12 (CABLE) ×2 IMPLANT
CATH RIGHTSITE C315HIS02 (CATHETERS) ×2 IMPLANT
IPG PACE AZUR XT DR MRI W1DR01 (Pacemaker) IMPLANT
KIT MICROPUNCTURE NIT STIFF (SHEATH) ×1 IMPLANT
LEAD CAPSURE NOVUS 5076-52CM (Lead) ×1 IMPLANT
LEAD SELECT SECURE 3830 383069 (Lead) IMPLANT
PACE AZURE XT DR MRI W1DR01 (Pacemaker) ×2 IMPLANT
PAD DEFIB RADIO PHYSIO CONN (PAD) ×2 IMPLANT
SELECT SECURE 3830 383069 (Lead) ×2 IMPLANT
SHEATH 7FR PRELUDE SNAP 13 (SHEATH) ×2 IMPLANT
SLITTER 6232ADJ (MISCELLANEOUS) ×2 IMPLANT
TRAY PACEMAKER INSERTION (PACKS) ×2 IMPLANT
WIRE HI TORQ VERSACORE-J 145CM (WIRE) ×1 IMPLANT

## 2021-09-04 NOTE — Interval H&P Note (Signed)
History and Physical Interval Note:  09/04/2021 10:07 AM  Jonathan [name]  has presented today for surgery, with the diagnosis of bradicardia.  The various methods of treatment have been discussed with the patient and family. After consideration of risks, benefits and other options for treatment, the patient has consented to  Procedure(s): PACEMAKER IMPLANT (N/A) as a surgical intervention.  The patient's history has been reviewed, patient examined, no change in status, stable for surgery.  I have reviewed the patient's chart and labs.  Questions were answered to the patient's satisfaction.   Risks, benefits, alternatives to pacemaker implantation were discussed in detail with the patient today. The patient understands that the risks include but are not limited to bleeding, infection, pneumothorax, perforation, tamponade, vascular damage, renal failure, MI, stroke, death,  and lead dislodgement and wishes to proceed.      Thompson Grayer

## 2021-09-04 NOTE — Discharge Instructions (Signed)
After Your Pacemaker   You have a Medtronic Pacemaker  ACTIVITY Do not lift your arm above shoulder height for 1 week after your procedure. After 7 days, you may progress as below.  You should remove your sling 24 hours after your procedure, unless otherwise instructed by your provider.     Friday September 11, 2021  Saturday September 12, 2021 Sunday September 13, 2021 Monday September 14, 2021   Do not lift, push, pull, or carry anything over 10 pounds with the affected arm until 6 weeks (Friday October 16, 2021 ) after your procedure.   You may drive AFTER your wound check, unless you have been told otherwise by your provider.   Ask your healthcare provider when you can go back to work   INCISION/Dressing If you are on a blood thinner such as Coumadin, Xarelto, Eliquis, Plavix, or Pradaxa please confirm with your provider when this should be resumed.  If large square, outer bandage is left in place, this can be removed after 24 hours from your procedure. Do not remove steri-strips or glue as below.   Monitor your Pacemaker site for redness, swelling, and drainage. Call the device clinic at [phone] if you experience these symptoms or fever/chills.  If your incision is sealed with Steri-strips or staples, you may shower 10 days after your procedure or when told by your provider. Do not remove the steri-strips or let the shower hit directly on your site. You may wash around your site with soap and water.    If you were discharged in a sling, please do not wear this during the day more than 48 hours after your surgery unless otherwise instructed. This may increase the risk of stiffness and soreness in your shoulder.   Avoid lotions, ointments, or perfumes over your incision until it is well-healed.  You may use a hot tub or a pool AFTER your wound check appointment if the incision is completely closed.  Pacemaker Alerts:  Some alerts are vibratory and others beep. These are NOT  emergencies. Please call our office to let us know. If this occurs at night or on weekends, it can wait until the next business day. Send a remote transmission.  If your device is capable of reading fluid status (for heart failure), you will be offered monthly monitoring to review this with you.   DEVICE MANAGEMENT Remote monitoring is used to monitor your pacemaker from home. This monitoring is scheduled every 91 days by our office. It allows [place] to keep an eye on the functioning of your device to ensure it is working properly. You will routinely see your Electrophysiologist annually (more often if necessary).   You should receive your ID card for your new device in 4-8 weeks. Keep this card with you at all times once received. Consider wearing a medical alert bracelet or necklace.  Your Pacemaker may be MRI compatible. This will be discussed at your next office visit/wound check.  You should avoid contact with strong electric or magnetic fields.   Do not use amateur (ham) radio equipment or electric (arc) welding torches. MP3 player headphones with magnets should not be used. Some devices are safe to use if held at least 12 inches (30 cm) from your Pacemaker. These include power tools, lawn mowers, and speakers. If you are unsure if something is safe to use, ask your health care provider.  When using your cell phone, hold it to the ear that is on the opposite side from the Pacemaker.  Do not leave your cell phone in a pocket over the Pacemaker.  You may safely use electric blankets, heating pads, computers, and microwave ovens.  Call the office right away if: You have chest pain. You feel more short of breath than you have felt before. You feel more light-headed than you have felt before. Your incision starts to open up.  This information is not intended to replace advice given to you by your health care provider. Make sure you discuss any questions you have with your health care provider.

## 2021-09-07 ENCOUNTER — Telehealth: Payer: Self-pay

## 2021-09-07 ENCOUNTER — Encounter (HOSPITAL_COMMUNITY): Payer: Self-pay | Admitting: Internal Medicine

## 2021-09-07 MED FILL — Fentanyl Citrate Preservative Free (PF) Inj 100 MCG/2ML: INTRAMUSCULAR | Qty: 2 | Status: AC

## 2021-09-07 NOTE — Telephone Encounter (Signed)
Follow-up after same day discharge: Implant date: 09/04/21 MD: Thompson Grayer, MD Device: Medtronic - Azure Pacemaker Location: Left Chest    Wound check visit: 09/23/2021 @ 9:20 am 90 day MD follow-up: TBD  Remote Transmission received:Yes  Dressing removed: Yes Sling removed: Yes

## 2021-09-07 NOTE — Telephone Encounter (Signed)
-----   Message from Shirley Friar, PA-C sent at 09/04/2021  2:46 PM EST ----- Same day pacer 1/27 [place]

## 2021-09-17 ENCOUNTER — Ambulatory Visit: Payer: Medicare Other

## 2021-09-23 ENCOUNTER — Ambulatory Visit (INDEPENDENT_AMBULATORY_CARE_PROVIDER_SITE_OTHER): Payer: Medicare Other

## 2021-09-23 ENCOUNTER — Other Ambulatory Visit: Payer: Self-pay

## 2021-09-23 DIAGNOSIS — I453 Trifascicular block: Secondary | ICD-10-CM

## 2021-09-23 DIAGNOSIS — I441 Atrioventricular block, second degree: Secondary | ICD-10-CM

## 2021-09-23 DIAGNOSIS — I440 Atrioventricular block, first degree: Secondary | ICD-10-CM

## 2021-09-23 LAB — CUP PACEART INCLINIC DEVICE CHECK
Battery Remaining Longevity: 144 mo
Battery Voltage: 3.2 V
Brady Statistic AP VP Percent: 11.8 %
Brady Statistic AP VS Percent: 0 %
Brady Statistic AS VP Percent: 87.73 %
Brady Statistic AS VS Percent: 0.47 %
Brady Statistic RA Percent Paced: 11.91 %
Brady Statistic RV Percent Paced: 99.53 %
Date Time Interrogation Session: 20230215095701
Implantable Lead Implant Date: 20230127
Implantable Lead Implant Date: 20230127
Implantable Lead Location: 753859
Implantable Lead Location: 753860
Implantable Lead Model: 3830
Implantable Lead Model: 5076
Implantable Pulse Generator Implant Date: 20230127
Lead Channel Impedance Value: 304 Ohm
Lead Channel Impedance Value: 456 Ohm
Lead Channel Impedance Value: 513 Ohm
Lead Channel Impedance Value: 665 Ohm
Lead Channel Pacing Threshold Amplitude: 0.5 V
Lead Channel Pacing Threshold Amplitude: 0.75 V
Lead Channel Pacing Threshold Pulse Width: 0.4 ms
Lead Channel Pacing Threshold Pulse Width: 0.4 ms
Lead Channel Sensing Intrinsic Amplitude: 16.125 mV
Lead Channel Sensing Intrinsic Amplitude: 2.625 mV
Lead Channel Setting Pacing Amplitude: 3.5 V
Lead Channel Setting Pacing Amplitude: 3.5 V
Lead Channel Setting Pacing Pulse Width: 0.4 ms
Lead Channel Setting Sensing Sensitivity: 0.9 mV

## 2021-09-23 NOTE — Progress Notes (Signed)
Wound check appointment. Steri-strips removed. Wound without redness or edema. Incision edges approximated, wound well healed. Normal device function. Thresholds, sensing, and impedances consistent with implant measurements. Device programmed at 3.5V/auto capture programmed on for extra safety margin until 3 month visit. Histogram distribution appropriate for patient and level of activity. No mode switches.  1 high ventricular rates noted- appears NSVT for ~20 beats with average A/V rates of 84/159, patient denies any symptoms at time of episode.  Patient educated about wound care, arm mobility, lifting restrictions.  Patient is enrolled in remote monitoring, next scheduled check 12/07/21.   ROV in 3 months with Dr. Rayann Heman once schedule is released.

## 2021-09-23 NOTE — Patient Instructions (Signed)

## 2021-09-28 ENCOUNTER — Inpatient Hospital Stay: Payer: Medicare Other | Attending: Oncology | Admitting: Oncology

## 2021-09-28 ENCOUNTER — Other Ambulatory Visit: Payer: Self-pay

## 2021-09-28 VITALS — BP 147/69 | HR 75 | Temp 97.8°F | Resp 19 | Ht 74.0 in | Wt 222.2 lb

## 2021-09-28 DIAGNOSIS — D0001 Carcinoma in situ of labial mucosa and vermilion border: Secondary | ICD-10-CM | POA: Diagnosis not present

## 2021-09-28 DIAGNOSIS — E119 Type 2 diabetes mellitus without complications: Secondary | ICD-10-CM | POA: Diagnosis not present

## 2021-09-28 DIAGNOSIS — C969 Malignant neoplasm of lymphoid, hematopoietic and related tissue, unspecified: Secondary | ICD-10-CM | POA: Diagnosis not present

## 2021-09-28 DIAGNOSIS — C4442 Squamous cell carcinoma of skin of scalp and neck: Secondary | ICD-10-CM

## 2021-09-28 DIAGNOSIS — R32 Unspecified urinary incontinence: Secondary | ICD-10-CM | POA: Diagnosis not present

## 2021-09-28 DIAGNOSIS — Z96653 Presence of artificial knee joint, bilateral: Secondary | ICD-10-CM | POA: Insufficient documentation

## 2021-09-28 DIAGNOSIS — I2510 Atherosclerotic heart disease of native coronary artery without angina pectoris: Secondary | ICD-10-CM | POA: Diagnosis not present

## 2021-09-28 NOTE — Progress Notes (Signed)
°  [facility] OFFICE PROGRESS NOTE   Diagnosis: Squamous cell carcinoma  INTERVAL HISTORY:   Jonathan [name] returns as scheduled.  He reports feeling well.  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  He reports biting his lip following the head neck surgery.  He has developed a nodule at the lower lip.  No other nodules.  Objective:  Vital signs in last 24 hours:  Blood pressure (!) 147/69, pulse 75, temperature 97.8 F (36.6 C), temperature source Oral, resp. rate 19, height 6\' 2"  (1.88 m), weight 222 lb 3.2 oz (100.8 kg), SpO2 99 %.    HEENT: Oropharynx without visible mass, postradiation and surgical changes at the neck.  No mass.  1/2 cm clear round nodule at the right lower lip Lymphatics: No cervical, supraclavicular, axillary, or inguinal nodes Resp: Lungs clear bilaterally Cardio: Regular rate and rhythm with premature beats GI: No hepatosplenomegaly Vascular: No leg edema   Lab Results:  Lab Results  Component Value Date   WBC 6.6 08/26/2021   HGB 14.4 08/26/2021   HCT 41.8 08/26/2021   MCV 91 08/26/2021   PLT 231 08/26/2021   NEUTROABS 4.4 08/26/2021    CMP  Lab Results  Component Value Date   NA 139 08/26/2021   K 4.4 08/26/2021   CL 100 08/26/2021   CO2 23 08/26/2021   GLUCOSE 112 (H) 08/26/2021   BUN 16 08/26/2021   CREATININE 0.90 08/26/2021   CALCIUM 10.0 08/26/2021   PROT 6.4 05/08/2018   ALBUMIN 4.3 05/08/2018   AST 20 05/08/2018   ALT 13 05/08/2018   ALKPHOS 49 05/08/2018   BILITOT 0.7 05/08/2018   GFRNONAA >60 07/18/2019   GFRAA >60 07/18/2019    Medications: I have reviewed the patient's current medications.   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  Negative resection margins, favored to represent a metastasis from a skin or oral primary Adjuvant radiation, 6000 cGy in 30 fractions, 01/16/2021 - 02/26/2021 MRI neck 05/04/2021-postsurgical/treatment changes, no definite evidence of residual tumor, no lymphadenopathy PET 05/06/2021-interval resection of right submandibular tumor, no recurrent carcinom History of melanoma in situ of the left lower leg 08/05/2017 Squamous cell carcinoma of the anterior chest September 2022, wide excision November 2022 Squamous cell carcinoma in situ left lip September 2022 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 07/19/2016 Diabetes History of detached retina Coronary artery disease, coronary artery bypass surgery 2005, coronary stent 2020 Urinary incontinence Bilateral knee replacement    Disposition: Mr. Stineman remains in clinical remission from the squamous cell carcinoma.  He will return for an office visit in 4 months.  We will consider repeat imaging at a 1 year interval.  He has a new nodular lesion at the lower lip.  This may be a basal cell or another skin cancer.  He will schedule appointment with Dr. Ronnald Ramp.    Betsy Coder, MD  09/28/2021  10:29 AM

## 2021-09-29 ENCOUNTER — Telehealth: Payer: Self-pay

## 2021-09-29 NOTE — Telephone Encounter (Signed)
Patient called and left a voicemail stated Dr. Ronnald Ramp find a squamous cell carcinoma on his lip, and to let Dr. Benay Spice know. The information was place on the provider desk.

## 2021-09-29 NOTE — Telephone Encounter (Signed)
[facility] and spoke with [facility] and request late progress notes. She stated the note is not completed, when the provider complete the note she will fax the note over.

## 2021-10-30 IMAGING — CR DG CHEST 2V
2 series · 2 of 2 positions shown · non-contrast
Comparison: 08/24/2014

CLINICAL DATA: Substernal chest pain improved with nitroglycerin.

EXAM:
CHEST - 2 VIEW

[chest pa]
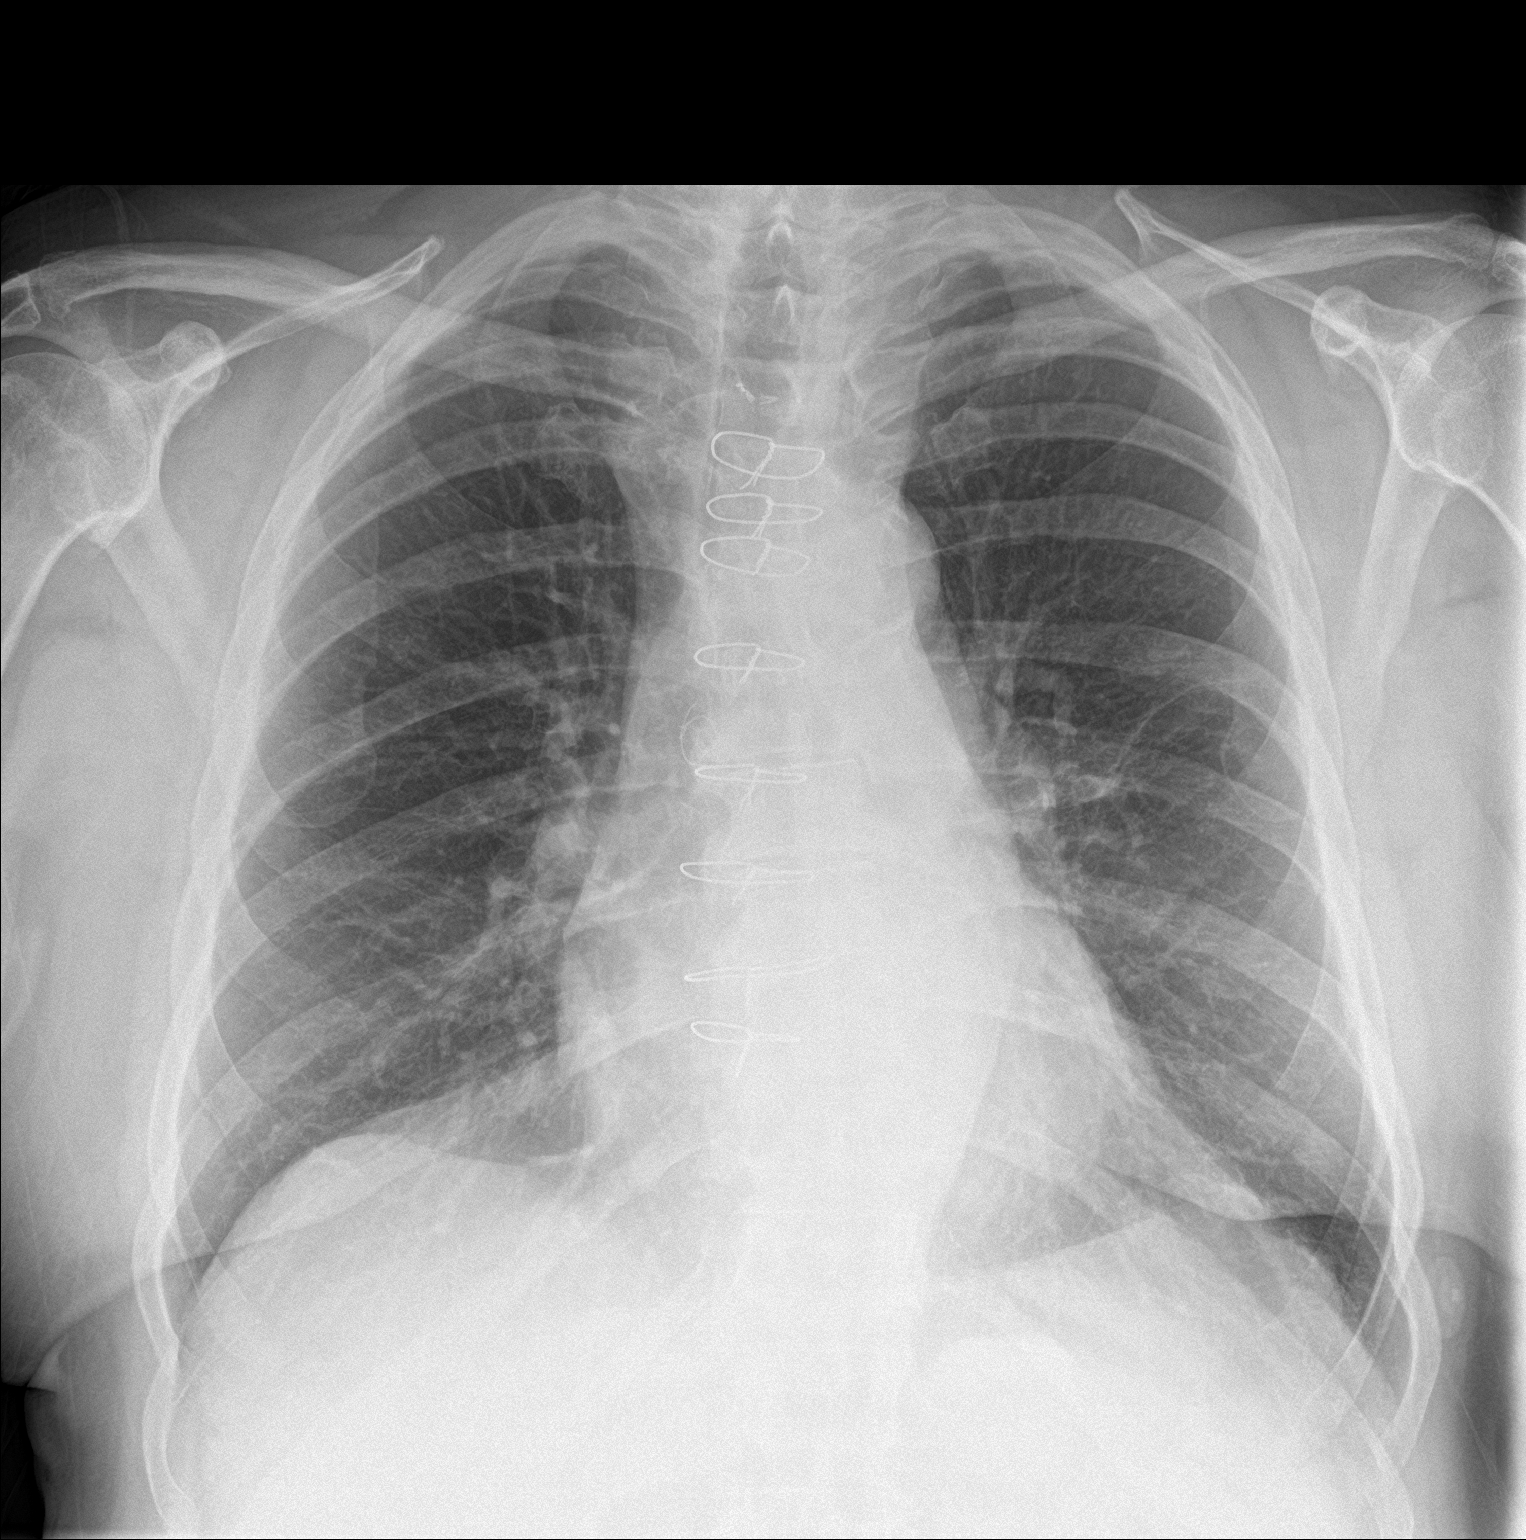

[chest lat]
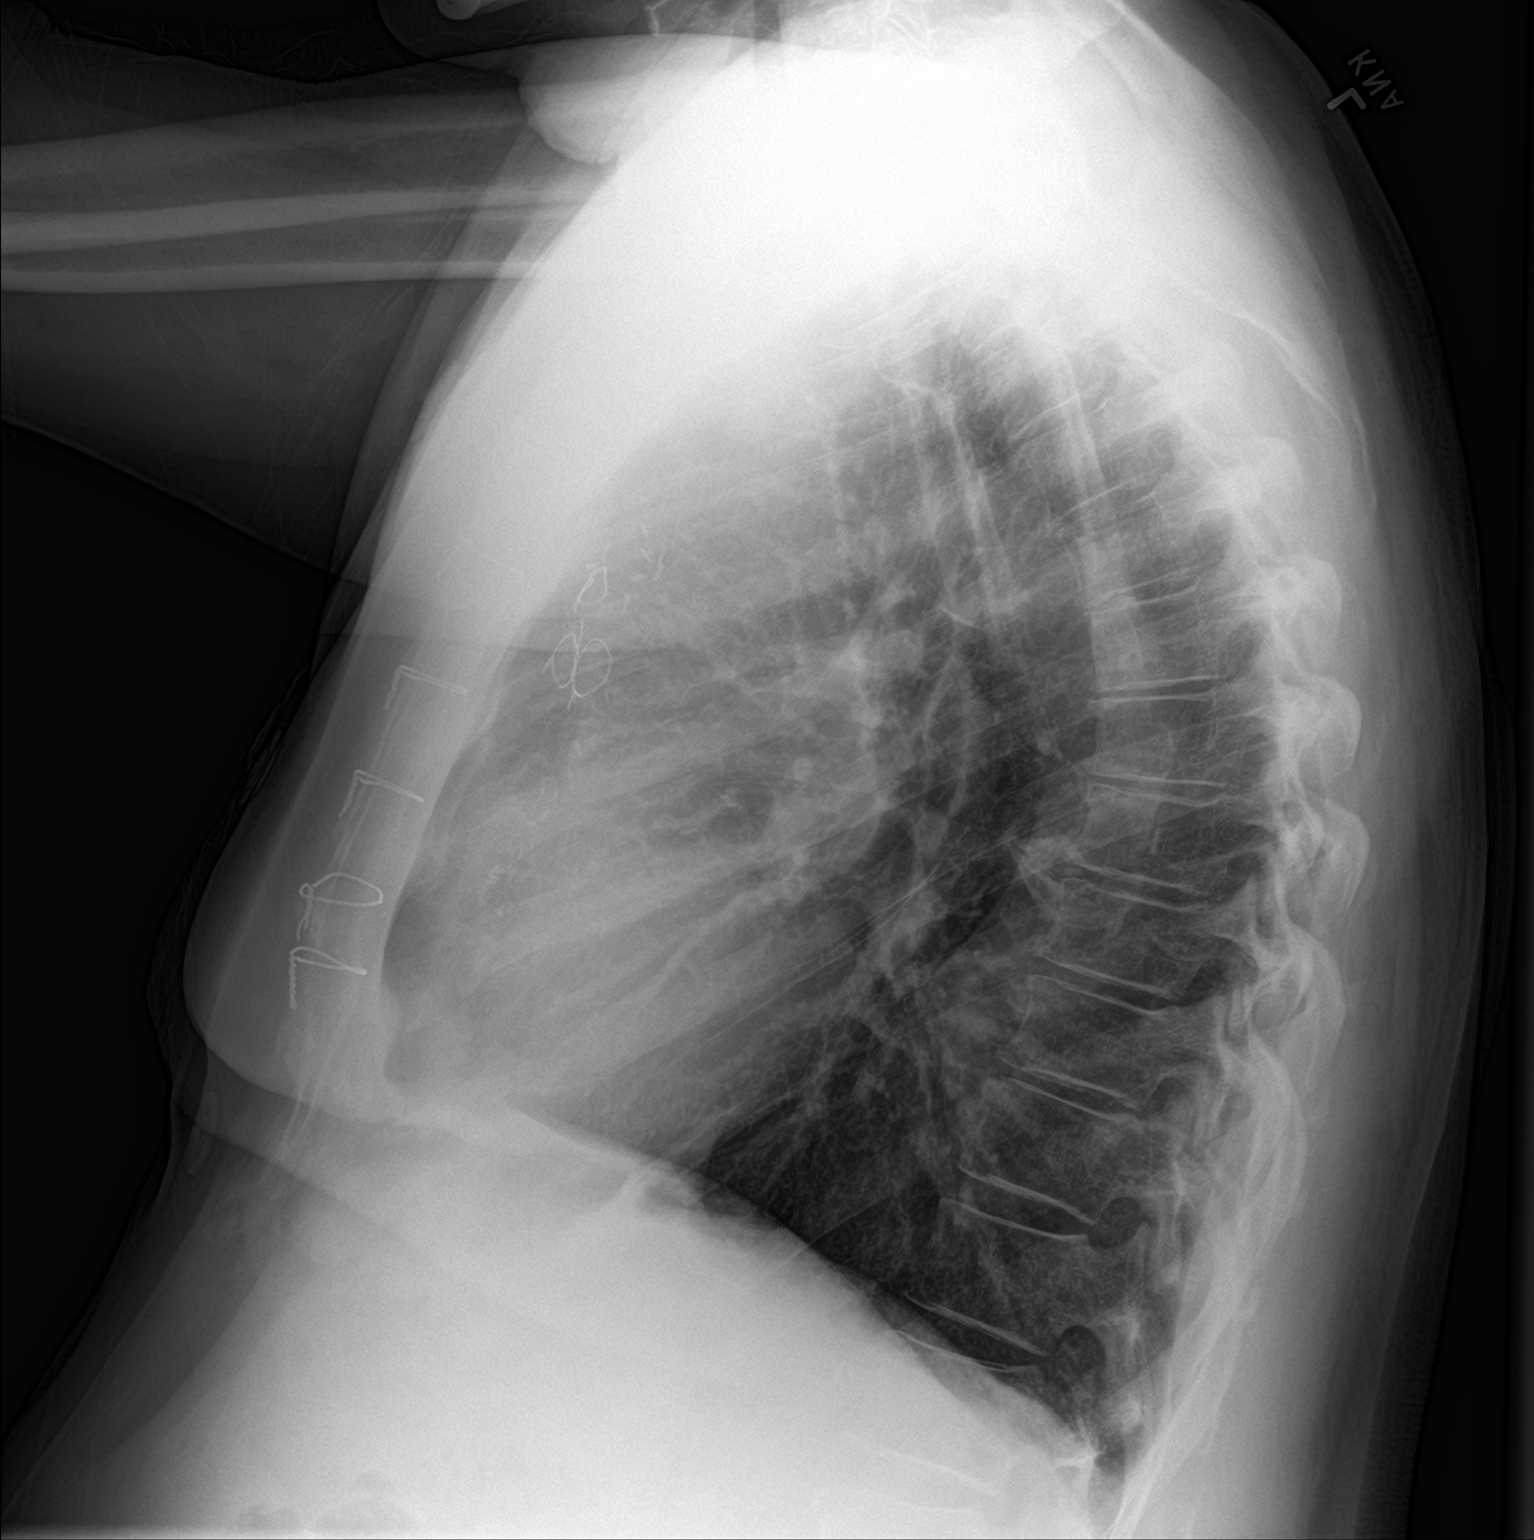

[2 of 2 positions shown; findings below may reference images not displayed]

FINDINGS: Previous median sternotomy and CABG. Heart size is normal. Aortic
atherosclerosis with tortuosity. Pulmonary vascularity is normal.
The lungs are clear. No effusions. No acute bone finding.
IMPRESSION: No active disease. Previous CABG. Aortic atherosclerosis.

## 2021-11-04 ENCOUNTER — Encounter: Payer: Self-pay | Admitting: Oncology

## 2021-12-01 ENCOUNTER — Encounter (INDEPENDENT_AMBULATORY_CARE_PROVIDER_SITE_OTHER): Payer: Self-pay

## 2021-12-02 ENCOUNTER — Encounter (INDEPENDENT_AMBULATORY_CARE_PROVIDER_SITE_OTHER): Payer: Self-pay | Admitting: Ophthalmology

## 2021-12-02 ENCOUNTER — Ambulatory Visit (INDEPENDENT_AMBULATORY_CARE_PROVIDER_SITE_OTHER): Payer: Medicare Other | Admitting: Ophthalmology

## 2021-12-02 DIAGNOSIS — H353112 Nonexudative age-related macular degeneration, right eye, intermediate dry stage: Secondary | ICD-10-CM | POA: Diagnosis not present

## 2021-12-02 DIAGNOSIS — I25810 Atherosclerosis of coronary artery bypass graft(s) without angina pectoris: Secondary | ICD-10-CM | POA: Diagnosis not present

## 2021-12-02 DIAGNOSIS — H35722 Serous detachment of retinal pigment epithelium, left eye: Secondary | ICD-10-CM | POA: Diagnosis not present

## 2021-12-02 DIAGNOSIS — H26491 Other secondary cataract, right eye: Secondary | ICD-10-CM | POA: Diagnosis not present

## 2021-12-02 DIAGNOSIS — H353221 Exudative age-related macular degeneration, left eye, with active choroidal neovascularization: Secondary | ICD-10-CM | POA: Diagnosis not present

## 2021-12-02 MED ORDER — BEVACIZUMAB 2.5 MG/0.1ML IZ SOSY
2.5000 mg | PREFILLED_SYRINGE | INTRAVITREAL | Status: AC | PRN
  Administered 2021-12-02: 2.5 mg via INTRAVITREAL

## 2021-12-02 NOTE — Assessment & Plan Note (Signed)
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 Rutherford Guys ? ?We will need to commence with therapy with antivegF, available Avastin today will commence today and reevaluate next and in 5 to 6 weeks.  Likely need a series of injections ? ?Risk benefits reviewed with the patient and family. ?

## 2021-12-02 NOTE — Progress Notes (Signed)
? ? ?12/02/2021 ? ?  ? ?CHIEF COMPLAINT ?Patient presents for  ?Chief Complaint  ?Patient presents with  ? Retina Evaluation  ? ? ? ? ?HISTORY OF PRESENT ILLNESS: ?Jonathan [name] is a 86 y.o. male who presents to the clinic today for:  ? ?HPI   ? ? Retina Evaluation   ? ?      ? Laterality: left eye  ? Onset: months ago  ? Associated Symptoms: Negative for Flashes, Floaters, Distortion, Blind Spot, Pain and Photophobia  ? ?  ?  ? ? Comments   ?NP- Wet ARMD OS, referred by Dr. Gershon Crane. ?Last seen by Dr. Zadie Rhine years ago. ?Patient reports he visited Dr. Gershon Crane yesterday for an exam after not seeing him for 3 years. Patient states his vision was 20/20 and has worsened over the years. Patient states Dr. Kellie Moor office did imaging and tests and got the glasses prescription, but they are waiting to fill the prescription to get new glasses. Patient reports noticing a change in vision about 3 months ago, states "I was having trouble doing my taxes on the screen." ? ?  ?  ?Last edited by Laurin Coder on 12/02/2021  8:59 AM.  ?  ? ? ?Referring physician: ?Rutherford Guys, MD ?[address] ?[place],  [place] [address] ? ?HISTORICAL INFORMATION:  ? ?Selected notes from the [facility] ?  ? ?Lab Results  ?Component Value Date  ? HGBA1C 6.7 (H) 07/17/2019  ?  ? ?CURRENT MEDICATIONS: ?No current outpatient medications on file. (Ophthalmic Drugs)  ? ?No current facility-administered medications for this visit. (Ophthalmic Drugs)  ? ?Current Outpatient Medications (Other)  ?Medication Sig  ? allopurinol (ZYLOPRIM) 300 MG tablet Take 1 tablet (300 mg total) by mouth daily.  ? aspirin EC 81 MG tablet Take 81 mg by mouth daily.  ? benazepril-hydrochlorthiazide (LOTENSIN HCT) 20-25 MG tablet Take 1 tablet by mouth daily.  ? cetirizine (ZYRTEC) 10 MG tablet Take 10 mg by mouth daily as needed for allergies.  ? furosemide (LASIX) 20 MG tablet Take 1 tablet (20 mg total) by mouth daily as needed for fluid or edema. (Patient  taking differently: Take 20 mg by mouth daily as needed for fluid or edema (as needed).)  ? glipiZIDE (GLUCOTROL) 5 MG tablet Take 1 tablet (5 mg total) by mouth 2 (two) times daily.  ? glucose blood (FREESTYLE LITE) test strip USE TWICE A DAY AS DIRECTED (E11.9)  ? insulin glargine (LANTUS) 100 UNIT/ML injection Inject 5-50 Units into the skin 2 (two) times daily. Per sliding scale  ? levothyroxine (SYNTHROID, LEVOTHROID) 100 MCG tablet Take 1 tablet (100 mcg total) by mouth every morning.  ? metFORMIN (GLUCOPHAGE) 1000 MG tablet Take 1 tablet (1,000 mg total) by mouth 2 (two) times daily with a meal.  ? Multiple Vitamin (MULTIVITAMIN PO) Take 1 tablet by mouth daily.    ? nitroGLYCERIN (NITROSTAT) 0.4 MG SL tablet Place 1 tablet (0.4 mg total) under the tongue every 5 (five) minutes x 3 doses as needed for chest pain. (Patient taking differently: Place 0.4 mg under the tongue every 5 (five) minutes x 3 doses as needed for chest pain (as needed).)  ? Omega-3 Fatty Acids (FISH OIL) 1000 MG CAPS Take 1,000 mg by mouth daily.   ? omeprazole (PRILOSEC) 20 MG capsule Take 1 capsule (20 mg total) by mouth daily as needed (heartburn or acid reflux). (Patient taking differently: Take 20 mg by mouth daily.)  ? oxybutynin (DITROPAN XL)  15 MG 24 hr tablet Take 15 mg by mouth daily.  ? oxymetazoline (AFRIN) 0.05 % nasal spray Place 1 spray into both nostrils 2 (two) times daily as needed for congestion.  ? pioglitazone (ACTOS) 45 MG tablet Take 1 tablet (45 mg total) by mouth daily.  ? polycarbophil (FIBERCON) 625 MG tablet Take 1,250 mg by mouth 2 (two) times daily.   ? polyethylene glycol (MIRALAX / GLYCOLAX) packet Take 17 g by mouth daily as needed for moderate constipation.  ? Probiotic Product (ALIGN) 4 MG CAPS Take 1 capsule (4 mg total) by mouth daily.  ? simvastatin (ZOCOR) 40 MG tablet Take 1 tablet (40 mg total) by mouth at bedtime.  ? ?No current facility-administered medications for this visit. (Other)   ? ? ? ? ?REVIEW OF SYSTEMS: ?ROS   ?Negative for: Constitutional, Gastrointestinal, Neurological, Skin, Genitourinary, Musculoskeletal, HENT, Endocrine, Cardiovascular, Eyes, Respiratory, Psychiatric, Allergic/Imm, Heme/Lymph ?Last edited by Hurman Horn, MD on 12/02/2021  9:52 AM.  ?  ? ? ? ?ALLERGIES ?Allergies  ?Allergen Reactions  ? Bee Venom Anaphylaxis  ? ? ?PAST MEDICAL HISTORY ?Past Medical History:  ?Diagnosis Date  ? Benign prostatic hypertrophy   ? CAD (coronary artery disease)   ? CABG 2005 by Dr Cyndia Bent  ? DJD (degenerative joint disease)   ? bilateral knee, gout  ? GERD (gastroesophageal reflux disease)   ? HX OF  ? Glomerulonephritis acute   ? at age 38, due to beta strep, resolved, renal calculi- passed spontaneously  ? Gout   ? Hepatitis A 1950  ? Hepatitis B 1980's  ? History of kidney stones   ? 3-4 TIMES SINCE 1970 PASSED ON OWN  ? HOH (hard of hearing)   ? wears hearing aids  ? Hyperlipidemia   ? Hypertension   ? Hypothyroidism   ? Incontinence of urine   ? Legally blind in right eye, as defined in [place]   ? Personal history of other infectious and parasitic disease   ? Sleep apnea 1990's  ? "before uvulectomy"  ? Squamous cell carcinoma   ? "lots on my arms & face" (09/21/2013)  ? Sun-damaged skin   ? Tuberculosis   ? 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 IN MED SCHOOL IN 1961[place] HAD TB  ? Type II diabetes mellitus ([place])   ? ?Past Surgical History:  ?Procedure Laterality Date  ? CARDIAC CATHETERIZATION    ? CATARACT EXTRACTION W/ INTRAOCULAR LENS  IMPLANT, BILATERAL Bilateral   ? CORONARY ARTERY BYPASS GRAFT  01/23/04  ? Dr Cyndia Bent  ? CORONARY STENT INTERVENTION  07/17/2019  ? CORONARY STENT INTERVENTION N/A 07/17/2019  ? Procedure: CORONARY STENT INTERVENTION;  Surgeon: Martinique, Peter M, MD;  Location: [facility];  Service: Cardiovascular;  Laterality: N/A;  ? CYSTOSCOPY WITH INSERTION OF UROLIFT N/A 06/23/2017  ? Procedure: CYSTOSCOPY WITH INSERTION OF UROLIFT;  Surgeon: Franchot Gallo, MD;   Location[facility];  Service: Urology;  Laterality: N/A;  ? EYE SURGERY Right   ? detached retina ,blind in right eye  ? INGUINAL HERNIA REPAIR Bilateral 1980's - 1990's  ? LEFT HEART CATH AND CORONARY ANGIOGRAPHY N/A 07/17/2019  ? Procedure: LEFT HEART CATH AND CORONARY ANGIOGRAPHY;  Surgeon: Martinique, Peter M, MD;  Location: [facility];  Service: Cardiovascular;  Laterality: N/A;  ? MASTOIDECTOMY Right 1943  ? PACEMAKER IMPLANT N/A 09/04/2021  ? Procedure: PACEMAKER IMPLANT;  Surgeon: Thompson Grayer, MD;  Location: [facility];  Service: Cardiovascular;  Laterality: N/A;  ? QUADRICEPS REPAIR Right 2009  ? RETINAL DETACHMENT SURGERY Right X 2  ? SEPSIS  1943  ? TONSILLECTOMY  ~ 1943  ? TOTAL KNEE ARTHROPLASTY Left 09/18/2013  ? Procedure: TOTAL KNEE ARTHROPLASTY;  Surgeon: Hessie Dibble, MD;  Location: [facility];  Service: Orthopedics;  Laterality: Left;  ? TOTAL KNEE ARTHROPLASTY Right 08/22/2014  ? Procedure: RIGHT TOTAL KNEE ARTHROPLASTY;  Surgeon: Hessie Dibble, MD;  Location: [facility];  Service: Orthopedics;  Laterality: Right;  ? UMBILICAL HERNIA REPAIR    ? UVULOPALATOPLASTY  1990's  ? ENT MD DR Rock Nephew  ? ? ?FAMILY HISTORY ?Family History  ?Problem Relation Age of Onset  ? Diabetes Other   ?     1st degree relative  ? Hypertension Other   ?     Family History  ? ? ?SOCIAL HISTORY ?Social History  ? ?Tobacco Use  ? Smoking status: Former  ?  Packs/day: 1.00  ?  Years: 10.00  ?  Pack years: 10.00  ?  Types: Cigarettes  ?  Quit date: 09/09/1965  ?  Years since quitting: 56.2  ? Smokeless tobacco: Never  ?Vaping Use  ? Vaping Use: Never used  ?Substance Use Topics  ? Alcohol use: Yes  ?  Comment: Glass of wine on social occasion or out to ear  ? Drug use: No  ? ?  ? ?  ? ?OPHTHALMIC EXAM: ? ?Base Eye Exam   ? ? Visual Acuity (ETDRS)   ? ?   Right Left  ? Dist Barry CF at 4' 20/60 -1  ? Dist ph  NI 20/40 -2  ? ?  ?  ? ? Tonometry (Tonopen, 9:05 AM)   ? ?   Right Left  ? Pressure 13 14  ? ?  ?   ? ? Pupils   ? ?   Pupils Dark Light React APD  ? Right PERRL 3 3 Minimal None  ? Left PERRL 3 3 Minimal None  ? ?  ?  ? ? Visual Fields (Counting fingers)   ? ?   Left Right  ?  Full   ? Restrictions  Partial

## 2021-12-02 NOTE — Assessment & Plan Note (Signed)
We will recommend urgent YAG capsulotomy so as to maximize patient's visual acuity order to further monitor the macular condition in the right eye as well as provide best acuity possible ? ?We will deliver YAG capsulotomy in the right eye today to minimize patient's ambulatory excursions from his independent living situation ?

## 2021-12-14 ENCOUNTER — Ambulatory Visit (INDEPENDENT_AMBULATORY_CARE_PROVIDER_SITE_OTHER): Payer: Medicare Other

## 2021-12-14 DIAGNOSIS — I440 Atrioventricular block, first degree: Secondary | ICD-10-CM | POA: Diagnosis not present

## 2021-12-15 LAB — CUP PACEART REMOTE DEVICE CHECK
Battery Remaining Longevity: 142 mo
Battery Voltage: 3.14 V
Brady Statistic AP VP Percent: 23.22 %
Brady Statistic AP VS Percent: 0 %
Brady Statistic AS VP Percent: 76.19 %
Brady Statistic AS VS Percent: 0.59 %
Brady Statistic RA Percent Paced: 23.2 %
Brady Statistic RV Percent Paced: 99.41 %
Date Time Interrogation Session: 20230507094609
Implantable Lead Implant Date: 20230127
Implantable Lead Implant Date: 20230127
Implantable Lead Location: 753859
Implantable Lead Location: 753860
Implantable Lead Model: 3830
Implantable Lead Model: 5076
Implantable Pulse Generator Implant Date: 20230127
Lead Channel Impedance Value: 285 Ohm
Lead Channel Impedance Value: 418 Ohm
Lead Channel Impedance Value: 494 Ohm
Lead Channel Impedance Value: 646 Ohm
Lead Channel Pacing Threshold Amplitude: 0.75 V
Lead Channel Pacing Threshold Amplitude: 0.75 V
Lead Channel Pacing Threshold Pulse Width: 0.4 ms
Lead Channel Pacing Threshold Pulse Width: 0.4 ms
Lead Channel Sensing Intrinsic Amplitude: 1.375 mV
Lead Channel Sensing Intrinsic Amplitude: 1.375 mV
Lead Channel Sensing Intrinsic Amplitude: 16.875 mV
Lead Channel Sensing Intrinsic Amplitude: 16.875 mV
Lead Channel Setting Pacing Amplitude: 2.25 V
Lead Channel Setting Pacing Amplitude: 2.25 V
Lead Channel Setting Pacing Pulse Width: 0.4 ms
Lead Channel Setting Sensing Sensitivity: 0.9 mV

## 2022-01-01 NOTE — Progress Notes (Signed)
Remote pacemaker transmission.   

## 2022-01-18 ENCOUNTER — Ambulatory Visit (INDEPENDENT_AMBULATORY_CARE_PROVIDER_SITE_OTHER): Payer: Medicare Other | Admitting: Ophthalmology

## 2022-01-18 ENCOUNTER — Encounter (INDEPENDENT_AMBULATORY_CARE_PROVIDER_SITE_OTHER): Payer: Self-pay | Admitting: Ophthalmology

## 2022-01-18 DIAGNOSIS — H353221 Exudative age-related macular degeneration, left eye, with active choroidal neovascularization: Secondary | ICD-10-CM | POA: Diagnosis not present

## 2022-01-18 DIAGNOSIS — Z8669 Personal history of other diseases of the nervous system and sense organs: Secondary | ICD-10-CM

## 2022-01-18 DIAGNOSIS — H26491 Other secondary cataract, right eye: Secondary | ICD-10-CM

## 2022-01-18 MED ORDER — BEVACIZUMAB 2.5 MG/0.1ML IZ SOSY
2.5000 mg | PREFILLED_SYRINGE | INTRAVITREAL | Status: AC | PRN
  Administered 2022-01-18: 2.5 mg via INTRAVITREAL

## 2022-01-18 NOTE — Progress Notes (Signed)
01/18/2022     CHIEF COMPLAINT Patient presents for  Chief Complaint  Patient presents with   Macular Degeneration      HISTORY OF PRESENT ILLNESS: Jonathan [name] is a 86 y.o. male who presents to the clinic today for:   HPI   6 weeks dilate OS, Avastin OS, OCT. Patient reports "I can see out of my right eye now. My vision has improved since he did the YAG procedure on my right eye last visit."  Last edited by Laurin Coder on 01/18/2022 11:06 AM.      Referring physician: No referring provider defined for this encounter.  HISTORICAL INFORMATION:   Selected notes from the MEDICAL RECORD NUMBER    Lab Results  Component Value Date   HGBA1C 6.7 (H) 07/17/2019     CURRENT MEDICATIONS: No current outpatient medications on file. (Ophthalmic Drugs)   No current facility-administered medications for this visit. (Ophthalmic Drugs)   Current Outpatient Medications (Other)  Medication Sig   allopurinol (ZYLOPRIM) 300 MG tablet Take 1 tablet (300 mg total) by mouth daily.   aspirin EC 81 MG tablet Take 81 mg by mouth daily.   benazepril-hydrochlorthiazide (LOTENSIN HCT) 20-25 MG tablet Take 1 tablet by mouth daily.   cetirizine (ZYRTEC) 10 MG tablet Take 10 mg by mouth daily as needed for allergies.   furosemide (LASIX) 20 MG tablet Take 1 tablet (20 mg total) by mouth daily as needed for fluid or edema. (Patient taking differently: Take 20 mg by mouth daily as needed for fluid or edema (as needed).)   glipiZIDE (GLUCOTROL) 5 MG tablet Take 1 tablet (5 mg total) by mouth 2 (two) times daily.   glucose blood (FREESTYLE LITE) test strip USE TWICE A DAY AS DIRECTED (E11.9)   insulin glargine (LANTUS) 100 UNIT/ML injection Inject 5-50 Units into the skin 2 (two) times daily. Per sliding scale   levothyroxine (SYNTHROID, LEVOTHROID) 100 MCG tablet Take 1 tablet (100 mcg total) by mouth every morning.   metFORMIN (GLUCOPHAGE) 1000 MG tablet Take 1 tablet (1,000 mg total) by  mouth 2 (two) times daily with a meal.   Multiple Vitamin (MULTIVITAMIN PO) Take 1 tablet by mouth daily.     nitroGLYCERIN (NITROSTAT) 0.4 MG SL tablet Place 1 tablet (0.4 mg total) under the tongue every 5 (five) minutes x 3 doses as needed for chest pain. (Patient taking differently: Place 0.4 mg under the tongue every 5 (five) minutes x 3 doses as needed for chest pain (as needed).)   Omega-3 Fatty Acids (FISH OIL) 1000 MG CAPS Take 1,000 mg by mouth daily.    omeprazole (PRILOSEC) 20 MG capsule Take 1 capsule (20 mg total) by mouth daily as needed (heartburn or acid reflux). (Patient taking differently: Take 20 mg by mouth daily.)   oxybutynin (DITROPAN XL) 15 MG 24 hr tablet Take 15 mg by mouth daily.   oxymetazoline (AFRIN) 0.05 % nasal spray Place 1 spray into both nostrils 2 (two) times daily as needed for congestion.   pioglitazone (ACTOS) 45 MG tablet Take 1 tablet (45 mg total) by mouth daily.   polycarbophil (FIBERCON) 625 MG tablet Take 1,250 mg by mouth 2 (two) times daily.    polyethylene glycol (MIRALAX / GLYCOLAX) packet Take 17 g by mouth daily as needed for moderate constipation.   Probiotic Product (ALIGN) 4 MG CAPS Take 1 capsule (4 mg total) by mouth daily.   simvastatin (ZOCOR) 40 MG tablet Take 1 tablet (40 mg  total) by mouth at bedtime.   No current facility-administered medications for this visit. (Other)      REVIEW OF SYSTEMS:    ALLERGIES Allergies  Allergen Reactions   Bee Venom Anaphylaxis    PAST MEDICAL HISTORY Past Medical History:  Diagnosis Date   Benign prostatic hypertrophy    CAD (coronary artery disease)    CABG 2005 by Dr Cyndia Bent   DJD (degenerative joint disease)    bilateral knee, gout   GERD (gastroesophageal reflux disease)    HX OF   Glomerulonephritis acute    at age 51, due to beta strep, resolved, renal calculi- passed spontaneously   Gout    Hepatitis A 1950   Hepatitis B 1980's   History of kidney stones    3-4 TIMES SINCE  1970 PASSED ON OWN   HOH (hard of hearing)    wears hearing aids   Hyperlipidemia    Hypertension    Hypothyroidism    Incontinence of urine    Legally blind in right eye, as defined in [place]    Personal history of other infectious and parasitic disease    Sleep apnea 1990's   "before uvulectomy"   Squamous cell carcinoma    "lots on my arms & face" (09/21/2013)   Sun-damaged skin    Tuberculosis    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 IN MED SCHOOL IN 1961[facility] HAD TB   Type II diabetes mellitus ([place])    Past Surgical History:  Procedure Laterality Date   CARDIAC CATHETERIZATION     CATARACT EXTRACTION W/ INTRAOCULAR LENS  IMPLANT, BILATERAL Bilateral    CORONARY ARTERY BYPASS GRAFT  01/23/04   Dr Cyndia Bent   CORONARY STENT INTERVENTION  07/17/2019   CORONARY STENT INTERVENTION N/A 07/17/2019   Procedure: CORONARY STENT INTERVENTION;  Surgeon: Martinique, Peter M, MD;  Location: [place] [facility];  Service: Cardiovascular;  Laterality: N/A;   CYSTOSCOPY WITH INSERTION OF UROLIFT N/A 06/23/2017   Procedure: CYSTOSCOPY WITH INSERTION OF UROLIFT;  Surgeon: Franchot Gallo, MD;  Location: [facility];  Service: Urology;  Laterality: N/A;   EYE SURGERY Right    detached retina ,blind in right eye   INGUINAL HERNIA REPAIR Bilateral 1980's - 1990's   LEFT HEART CATH AND CORONARY ANGIOGRAPHY N/A 07/17/2019   Procedure: LEFT HEART CATH AND CORONARY ANGIOGRAPHY;  Surgeon: Martinique, Peter M, MD;  Location: [place] [facility];  Service: Cardiovascular;  Laterality: N/A;   MASTOIDECTOMY Right 1943   PACEMAKER IMPLANT N/A 09/04/2021   Procedure: PACEMAKER IMPLANT;  Surgeon: Thompson Grayer, MD;  Location: [place] [facility];  Service: Cardiovascular;  Laterality: N/A;   QUADRICEPS REPAIR Right 2009   RETINAL DETACHMENT SURGERY Right X 2   SEPSIS  1943   TONSILLECTOMY  ~ 1943   TOTAL KNEE ARTHROPLASTY Left 09/18/2013   Procedure: TOTAL KNEE ARTHROPLASTY;  Surgeon: Hessie Dibble, MD;   Location: [place];  Service: Orthopedics;  Laterality: Left;   TOTAL KNEE ARTHROPLASTY Right 08/22/2014   Procedure: RIGHT TOTAL KNEE ARTHROPLASTY;  Surgeon: Hessie Dibble, MD;  Location: [place];  Service: Orthopedics;  Laterality: Right;   UMBILICAL HERNIA REPAIR     UVULOPALATOPLASTY  48's   ENT MD DR Rock Nephew    FAMILY HISTORY Family History  Problem Relation Age of Onset   Diabetes Other        1st degree relative   Hypertension Other        Family History  SOCIAL HISTORY Social History   Tobacco Use   Smoking status: Former    Packs/day: 1.00    Years: 10.00    Total pack years: 10.00    Types: Cigarettes    Quit date: 09/09/1965    Years since quitting: 56.3   Smokeless tobacco: Never  Vaping Use   Vaping Use: Never used  Substance Use Topics   Alcohol use: Yes    Comment: Glass of wine on social occasion or out to ear   Drug use: No         OPHTHALMIC EXAM:  Base Eye Exam     Visual Acuity (ETDRS)       Right Left   Dist [place] 20/100 -1 20/60 -1+2   Dist ph [place] 20/60 -1 20/30 -2         Tonometry (Tonopen, 11:10 AM)       Right Left   Pressure 19 19         Pupils       Pupils APD   Right PERRL None   Left PERRL None         Extraocular Movement       Right Left    Full Full         Neuro/Psych     Oriented x3: Yes   Mood/Affect: Normal         Dilation     Left eye: 1.0% Mydriacyl, 2.5% Phenylephrine @ 11:10 AM           Slit Lamp and Fundus Exam     External Exam       Right Left   External Normal Normal         Slit Lamp Exam       Right Left   Lids/Lashes Normal Normal   Conjunctiva/Sclera White and quiet White and quiet   Cornea Clear Clear   Anterior Chamber Deep and quiet Deep and quiet   Iris Round and reactive Round and reactive   Lens Centered posterior chamber intraocular lens, 3+ Posterior capsular opacification Centered posterior chamber intraocular lens   Anterior Vitreous Normal Normal          Fundus Exam       Right Left   Posterior Vitreous  Normal   Disc  Normal   C/D Ratio  0.35   Macula  Central subretinal fluid OS   Vessels  Normal   Periphery  Pavingstone degeneration no holes or tears            IMAGING AND PROCEDURES  Imaging and Procedures for 01/18/22  Intravitreal Injection, Pharmacologic Agent - OS - Left Eye       Time Out 01/18/2022. 11:42 AM. Confirmed correct patient, procedure, site, and patient consented.   Anesthesia Topical anesthesia was used. Anesthetic medications included Lidocaine 4%.   Procedure Preparation included 5% betadine to ocular surface, 10% betadine to eyelids, Tobramycin 0.3%, Ofloxacin . A 30 gauge needle was used.   Injection: 2.5 mg bevacizumab 2.5 MG/0.1ML   Route: Intravitreal, Site: Left Eye   NDC: [phone], Lot: [id number], Expiration date: 03/07/2022   Post-op Post injection exam found visual acuity of at least counting fingers. The patient tolerated the procedure well. There were no complications. The patient received written and verbal post procedure care education. Post injection medications were not given.      OCT, Retina - OU - Both Eyes       Right Eye Quality was good. Scan locations  included subfoveal. Central Foveal Thickness: 295. Progression has been stable. Findings include normal foveal contour, retinal drusen .   Left Eye Quality was good. Scan locations included subfoveal. Central Foveal Thickness: 294. Progression has improved. Findings include retinal drusen , subretinal fluid.   Notes OD with 3-4+ media opacity corresponds with severe posterior capsule opacity  OS Central subretinal fluid accumulation from wet AMD, still active 1 month after injection #1 will need to repeat with therapy with antivegF Avastin today.             ASSESSMENT/PLAN:  History of retinal detachment OD attached to doing well  Posterior capsular opacification, right eye OD, visual acuity  is recovered now to pinhole acuity of 20/60 post YAG capsulotomy.  Patient needs to return to Dr. Rutherford Guys for final refraction  Exudative age-related macular degeneration of left eye with active choroidal neovascularization ([place]) OS with chronic subretinal fluid post injection of 1 Avastin not substantially improved.  Repeat injection today next visit in 4 to 5 weeks will change to Eylea injection OS      ICD-10-CM   1. Exudative age-related macular degeneration of left eye with active choroidal neovascularization (HCC)  H35.3221 Intravitreal Injection, Pharmacologic Agent - OS - Left Eye    OCT, Retina - OU - Both Eyes    bevacizumab (AVASTIN) SOSY 2.5 mg    2. History of retinal detachment  Z86.69     3. Posterior capsular opacification, right eye  H26.491       1.  OS, slight improvement of wet AMD postinjection #1 Avastin.  Repeat today , need to change to Eylea OS next  2.  OD visual acuity improved from count fingers now 20/100 with pinhole to 20/60 post recent YAG capsulotomy.  3.  Ophthalmic Meds Ordered this visit:  Meds ordered this encounter  Medications   bevacizumab (AVASTIN) SOSY 2.5 mg       Return in about 1 month (around 02/17/2022) for dilate, OS, EYLEA OCT.  There are no Patient Instructions on file for this visit.   Explained the diagnoses, plan, and follow up with the patient and they expressed understanding.  Patient expressed understanding of the importance of proper follow up care.   Clent Demark Remi Rester M.D. Diseases & Surgery of the Retina and Vitreous [facility] [place] 01/18/22     Abbreviations: M myopia (nearsighted); A astigmatism; H hyperopia (farsighted); P presbyopia; Mrx spectacle prescription;  CTL contact lenses; OD right eye; OS left eye; OU both eyes  XT exotropia; ET esotropia; PEK punctate epithelial keratitis; PEE punctate epithelial erosions; DES dry eye syndrome; MGD meibomian gland dysfunction; ATs artificial tears;  PFAT's preservative free artificial tears; [place] nuclear sclerotic cataract; PSC posterior subcapsular cataract; ERM epi-retinal membrane; PVD posterior vitreous detachment; RD retinal detachment; DM diabetes mellitus; DR diabetic retinopathy; NPDR non-proliferative diabetic retinopathy; PDR proliferative diabetic retinopathy; CSME clinically significant macular edema; DME diabetic macular edema; dbh dot blot hemorrhages; CWS cotton wool spot; POAG primary open angle glaucoma; C/D cup-to-disc ratio; HVF humphrey visual field; GVF goldmann visual field; OCT optical coherence tomography; IOP intraocular pressure; BRVO Branch retinal vein occlusion; CRVO central retinal vein occlusion; CRAO central retinal artery occlusion; BRAO branch retinal artery occlusion; RT retinal tear; SB scleral buckle; PPV pars plana vitrectomy; VH Vitreous hemorrhage; PRP panretinal laser photocoagulation; IVK intravitreal kenalog; VMT vitreomacular traction; MH Macular hole;  NVD neovascularization of the disc; NVE neovascularization elsewhere; AREDS age related eye disease study; ARMD age related macular degeneration;  POAG primary open angle glaucoma; EBMD epithelial/anterior basement membrane dystrophy; ACIOL anterior chamber intraocular lens; IOL intraocular lens; PCIOL posterior chamber intraocular lens; Phaco/IOL phacoemulsification with intraocular lens placement; [place] photorefractive keratectomy; LASIK laser assisted in situ keratomileusis; HTN hypertension; DM diabetes mellitus; COPD chronic obstructive pulmonary disease

## 2022-01-18 NOTE — Assessment & Plan Note (Signed)
OD attached to doing well

## 2022-01-18 NOTE — Assessment & Plan Note (Signed)
OD, visual acuity is recovered now to pinhole acuity of 20/60 post YAG capsulotomy.  Patient needs to return to Dr. Rutherford Guys for final refraction

## 2022-01-18 NOTE — Assessment & Plan Note (Signed)
OS with chronic subretinal fluid post injection of 1 Avastin not substantially improved.  Repeat injection today next visit in 4 to 5 weeks will change to Eylea injection OS

## 2022-01-22 ENCOUNTER — Encounter: Payer: Self-pay | Admitting: Internal Medicine

## 2022-01-22 ENCOUNTER — Ambulatory Visit (INDEPENDENT_AMBULATORY_CARE_PROVIDER_SITE_OTHER): Payer: Medicare Other | Admitting: Internal Medicine

## 2022-01-22 VITALS — BP 108/64 | HR 69 | Ht 74.0 in | Wt 218.6 lb

## 2022-01-22 DIAGNOSIS — Z950 Presence of cardiac pacemaker: Secondary | ICD-10-CM | POA: Diagnosis not present

## 2022-01-22 DIAGNOSIS — I10 Essential (primary) hypertension: Secondary | ICD-10-CM

## 2022-01-22 DIAGNOSIS — I440 Atrioventricular block, first degree: Secondary | ICD-10-CM

## 2022-01-22 DIAGNOSIS — I25810 Atherosclerosis of coronary artery bypass graft(s) without angina pectoris: Secondary | ICD-10-CM

## 2022-01-22 NOTE — Patient Instructions (Addendum)
Medication Instructions:  Your physician recommends that you continue on your current medications as directed. Please refer to the Current Medication list given to you today. *If you need a refill on your cardiac medications before your next appointment, please call your pharmacy*  Lab Work: None ordered. If you have labs (blood work) drawn today and your tests are completely normal, you will receive your results only by: [place] (if you have MyChart) OR A paper copy in the mail If you have any lab test that is abnormal or we need to change your treatment, we will call you to review the results.  Testing/Procedures: None ordered.  Follow-Up: At [facility], you and your health needs are our priority.  As part of our continuing mission to provide you with exceptional heart care, we have created designated Provider Care Teams.  These Care Teams include your primary Cardiologist (physician) and Advanced Practice Providers (APPs -  Physician Assistants and Nurse Practitioners) who all work together to provide you with the care you need, when you need it.  Your next appointment:    Your physician wants you to follow-up in: one year with Dr. [name] will receive a reminder letter in the mail two months in advance. If you don't receive a letter, please call our office to schedule the follow-up appointment.  Remote monitoring is used to monitor your Pacemaker of ICD from home. This monitoring reduces the number of office visits required to check your device to one time per year. It allows [place] to keep an eye on the functioning of your device to ensure it is working properly. You are scheduled for a device check from home on 03/15/2022. You may send your transmission at any time that day. If you have a wireless device, the transmission will be sent automatically. After your physician reviews your transmission, you will receive a postcard with your next transmission date.   Important  Information About Sugar

## 2022-01-22 NOTE — Progress Notes (Signed)
PCP: Dorena Cookey, Jonathan [name] (Inactive)   Primary EP:  Jonathan [name] is a 86 y.o. male who presents today for routine electrophysiology followup.  Since his PPM implant, the patient reports doing very well.  Today, he denies symptoms of palpitations, chest pain, shortness of breath,  lower extremity edema, dizziness, presyncope, or syncope.  The patient is otherwise without complaint today.   Past Medical History:  Diagnosis Date   Benign prostatic hypertrophy    CAD (coronary artery disease)    CABG 2005 by Jonathan [name]   DJD (degenerative joint disease)    bilateral knee, gout   GERD (gastroesophageal reflux disease)    HX OF   Glomerulonephritis acute    at age 39, due to beta strep, resolved, renal calculi- passed spontaneously   Gout    Hepatitis A 1950   Hepatitis B 1980's   History of kidney stones    3-4 TIMES SINCE 1970 PASSED ON OWN   HOH (hard of hearing)    wears hearing aids   Hyperlipidemia    Hypertension    Hypothyroidism    Incontinence of urine    Legally blind in right eye, as defined in [place]    Personal history of other infectious and parasitic disease    Sleep apnea 1990's   "before uvulectomy"   Squamous cell carcinoma    "lots on my arms & face" (09/21/2013)   Sun-damaged skin    Tuberculosis    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 IN MED SCHOOL IN 1961[place] HAD TB   Type II diabetes mellitus ([place])    Past Surgical History:  Procedure Laterality Date   CARDIAC CATHETERIZATION     CATARACT EXTRACTION W/ INTRAOCULAR LENS  IMPLANT, BILATERAL Bilateral    CORONARY ARTERY BYPASS GRAFT  01/23/04   Jonathan [name]   CORONARY STENT INTERVENTION  07/17/2019   CORONARY STENT INTERVENTION N/A 07/17/2019   Procedure: CORONARY STENT INTERVENTION;  Surgeon: Jonathan [name], Jonathan [name], Jonathan [name];  Location: [place] [facility];  Service: Cardiovascular;  Laterality: N/A;   CYSTOSCOPY WITH INSERTION OF UROLIFT N/A 06/23/2017   Procedure: CYSTOSCOPY WITH INSERTION OF UROLIFT;  Surgeon:  Jonathan Gallo, Jonathan [name];  Location: [facility];  Service: Urology;  Laterality: N/A;   EYE SURGERY Right    detached retina ,blind in right eye   INGUINAL HERNIA REPAIR Bilateral 1980's - 1990's   LEFT HEART CATH AND CORONARY ANGIOGRAPHY N/A 07/17/2019   Procedure: LEFT HEART CATH AND CORONARY ANGIOGRAPHY;  Surgeon: Jonathan [name], Jonathan [name], Jonathan [name];  Location: [place] [facility];  Service: Cardiovascular;  Laterality: N/A;   MASTOIDECTOMY Right 1943   PACEMAKER IMPLANT N/A 09/04/2021   Procedure: PACEMAKER IMPLANT;  Surgeon: Jonathan Grayer, Jonathan [name];  Location: [place] [facility];  Service: Cardiovascular;  Laterality: N/A;   QUADRICEPS REPAIR Right 2009   RETINAL DETACHMENT SURGERY Right X 2   SEPSIS  1943   TONSILLECTOMY  ~ 1943   TOTAL KNEE ARTHROPLASTY Left 09/18/2013   Procedure: TOTAL KNEE ARTHROPLASTY;  Surgeon: Jonathan Dibble, Jonathan [name];  Location: [place];  Service: Orthopedics;  Laterality: Left;   TOTAL KNEE ARTHROPLASTY Right 08/22/2014   Procedure: RIGHT TOTAL KNEE ARTHROPLASTY;  Surgeon: Jonathan Dibble, Jonathan [name];  Location: [place];  Service: Orthopedics;  Laterality: Right;   UMBILICAL HERNIA REPAIR     UVULOPALATOPLASTY  1990's   ENT Jonathan [name] Jonathan Jonathan [name]    ROS- all systems are reviewed and negative except as per HPI above  Current Outpatient Medications  Medication Sig Dispense Refill   allopurinol (ZYLOPRIM) 300 MG tablet Take 1 tablet (300 mg total) by mouth daily. 90 tablet 4   aspirin EC 81 MG tablet Take 81 mg by mouth daily.     benazepril-hydrochlorthiazide (LOTENSIN HCT) 20-25 MG tablet Take 1 tablet by mouth daily. 90 tablet 3   cetirizine (ZYRTEC) 10 MG tablet Take 10 mg by mouth daily as needed for allergies.     furosemide (LASIX) 20 MG tablet Take 1 tablet (20 mg total) by mouth daily as needed for fluid or edema. 100 tablet 3   glipiZIDE (GLUCOTROL) 5 MG tablet Take 1 tablet (5 mg total) by mouth 2 (two) times daily. 180 tablet 4   glucose blood (FREESTYLE LITE) test strip USE TWICE A  DAY AS DIRECTED (E11.9) 200 each 1   insulin glargine (LANTUS) 100 UNIT/ML injection Inject 5-50 Units into the skin 2 (two) times daily. Per sliding scale     levothyroxine (SYNTHROID, LEVOTHROID) 100 MCG tablet Take 1 tablet (100 mcg total) by mouth every morning. 90 tablet 4   metFORMIN (GLUCOPHAGE) 1000 MG tablet Take 1 tablet (1,000 mg total) by mouth 2 (two) times daily with a meal. 200 tablet 4   Multiple Vitamin (MULTIVITAMIN PO) Take 1 tablet by mouth daily.       nitroGLYCERIN (NITROSTAT) 0.4 MG SL tablet Place 1 tablet (0.4 mg total) under the tongue every 5 (five) minutes x 3 doses as needed for chest pain. (Patient taking differently: Place 0.4 mg under the tongue every 5 (five) minutes x 3 doses as needed for chest pain (as needed).) 25 tablet 2   Omega-3 Fatty Acids (FISH OIL) 1000 MG CAPS Take 1,000 mg by mouth daily.      omeprazole (PRILOSEC) 20 MG capsule Take 1 capsule (20 mg total) by mouth daily as needed (heartburn or acid reflux). (Patient taking differently: Take 20 mg by mouth daily.) 90 capsule 4   oxybutynin (DITROPAN XL) 15 MG 24 hr tablet Take 15 mg by mouth daily.     oxymetazoline (AFRIN) 0.05 % nasal spray Place 1 spray into both nostrils 2 (two) times daily as needed for congestion.     pioglitazone (ACTOS) 45 MG tablet Take 1 tablet (45 mg total) by mouth daily. 90 tablet 3   polycarbophil (FIBERCON) 625 MG tablet Take 1,250 mg by mouth 2 (two) times daily.      polyethylene glycol (MIRALAX / GLYCOLAX) packet Take 17 g by mouth daily as needed for moderate constipation.     Probiotic Product (ALIGN) 4 MG CAPS Take 1 capsule (4 mg total) by mouth daily. 90 capsule 3   simvastatin (ZOCOR) 40 MG tablet Take 1 tablet (40 mg total) by mouth at bedtime. 90 tablet 3   No current facility-administered medications for this visit.    Physical Exam: Vitals:   01/22/22 1101  BP: 108/64  Pulse: 69  SpO2: 99%  Weight: 218 lb 9.6 oz (99.2 kg)  Height: '6\' 2"'$  (1.88 [name])     GEN- The patient is well appearing, alert and oriented x 3 today.   Head- normocephalic, atraumatic Eyes-  Sclera clear, conjunctiva pink Ears- hearing intact Oropharynx- clear Lungs- Clear to ausculation bilaterally, normal work of breathing Chest- pacemaker pocket is well healed Heart- Regular rate and rhythm, no murmurs, rubs or gallops, PMI not laterally displaced GI- soft, NT, ND, + BS Extremities- no clubbing, cyanosis, or edema  Pacemaker interrogation- reviewed in detail today,  See PACEART report  ekg tracing ordered today is personally reviewed and shows sinus with conduction system pacing  Assessment and Plan:  1. Symptomatic second degree block heart block Normal pacemaker function See Pace Art report No changes today he is not device dependant today  2. CAD  No ischemic symptoms No changes  3. HL Stable No change required today Labs 06/19/21 reviewed  Return to see me in a year  Jonathan Grayer Jonathan [name], [facility] 01/22/2022 11:25 AM

## 2022-01-25 ENCOUNTER — Inpatient Hospital Stay: Payer: Medicare Other | Attending: Oncology | Admitting: Oncology

## 2022-01-25 VITALS — BP 133/79 | HR 71 | Temp 98.1°F | Resp 18 | Ht 74.0 in | Wt 218.0 lb

## 2022-01-25 DIAGNOSIS — R32 Unspecified urinary incontinence: Secondary | ICD-10-CM | POA: Diagnosis not present

## 2022-01-25 DIAGNOSIS — C4402 Squamous cell carcinoma of skin of lip: Secondary | ICD-10-CM | POA: Insufficient documentation

## 2022-01-25 DIAGNOSIS — E119 Type 2 diabetes mellitus without complications: Secondary | ICD-10-CM | POA: Insufficient documentation

## 2022-01-25 DIAGNOSIS — C4442 Squamous cell carcinoma of skin of scalp and neck: Secondary | ICD-10-CM

## 2022-01-25 DIAGNOSIS — I2510 Atherosclerotic heart disease of native coronary artery without angina pectoris: Secondary | ICD-10-CM | POA: Diagnosis not present

## 2022-01-25 NOTE — Progress Notes (Signed)
**Jonathan [name] De-Identified via Obfuscation** Jonathan Jonathan [name]   Diagnosis: Squamous cell carcinoma  INTERVAL HISTORY:   Jonathan Jonathan [name] returns as scheduled.  He underwent biopsy of a right lower lip lesion 09/29/2021.  The pathology revealed well-differentiated squamous cell carcinoma.  He underwent Mohs surgery.  He reports by mouth and dry lips.  He has chronic urinary incontinence.  No other complaint.  Good appetite.  No new palpable abnormality at the neck.  Objective:  Vital signs in last 24 hours:  Blood pressure 133/79, pulse 71, temperature 98.1 F (36.7 C), temperature source Oral, resp. rate 18, height '6\' 2"'$  (1.88 m), weight 218 lb (98.9 kg), SpO2 98 %.    HEENT: The oropharynx is dry.  No visible mass.  Postradiation and surgical changes at the neck.  No mass. Lymphatics: No cervical, supraclavicular, axillary, or inguinal nodes Resp: Lungs clear bilaterally Cardio: Irregular GI: No hepatosplenomegaly Vascular: No leg edema  Skin: No mass at the lips  Lab Results:  Lab Results  Component Value Date   WBC 6.6 08/26/2021   HGB 14.4 08/26/2021   HCT 41.8 08/26/2021   MCV 91 08/26/2021   PLT 231 08/26/2021   NEUTROABS 4.4 08/26/2021    CMP  Lab Results  Component Value Date   NA 139 08/26/2021   K 4.4 08/26/2021   CL 100 08/26/2021   CO2 23 08/26/2021   GLUCOSE 112 (H) 08/26/2021   BUN 16 08/26/2021   CREATININE 0.90 08/26/2021   CALCIUM 10.0 08/26/2021   PROT 6.4 05/08/2018   ALBUMIN 4.3 05/08/2018   AST 20 05/08/2018   ALT 13 05/08/2018   ALKPHOS 49 05/08/2018   BILITOT 0.7 05/08/2018   GFRNONAA >60 07/18/2019   GFRAA >60 07/18/2019    No results found for: "CEA1", "CEA", "CAN199", "CA125"  Lab Results  Component Value Date   INR 1.07 08/14/2014   LABPROT 14.0 08/14/2014    Imaging:  No results found.  Medications: I have reviewed the patient's current medications.   Assessment/Plan: Squamous cell carcinoma involving the right submandibular  gland/lymphoid tissue CT neck 11/07/2020 -3.2 cm centrally necrotic lesion adjacent to a continuous with the right submandibular gland FNA biopsy 11/10/2020-squamous of carcinoma PET 11/25/2020-FDG avid mass in the right submandibular region arising from or directly involve the right submandibular gland, no other evidence of FDG avid tumor 12/04/2020-right level 1-4 node dissection, squamous cell carcinoma involving the submandibular gland, lymphoid, and soft tissue-perineural invasion, multiple negative lymph nodes.  Negative resection margins, favored to represent a metastasis from a skin or oral primary Adjuvant radiation, 6000 cGy in 30 fractions, 01/16/2021 - 02/26/2021 MRI neck 05/04/2021-postsurgical/treatment changes, no definite evidence of residual tumor, no lymphadenopathy PET 05/06/2021-interval resection of right submandibular tumor, no recurrent carcinom History of melanoma in situ of the left lower leg 08/05/2017 Squamous cell carcinoma of the anterior chest September 2022, wide excision November 2022 Squamous cell carcinoma in situ left lip September 2022 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 07/19/2016 Diabetes History of detached retina Coronary artery disease, coronary artery bypass surgery 2005, coronary stent 2020 Urinary incontinence Bilateral knee replacement Squamous cell carcinoma of the right lower lip February 2023     Disposition: Jonathan Jonathan [name] remains in clinical remission from the right neck squamous cell carcinoma.  He will return for an office visit in 4 months.  He will continue skin surveillance with Dr. Ronnald [name].  We discussed the indication for surveillance imaging.  He is comfortable being followed with clinical exams.  Jonathan Jonathan [name]  Jonathan Spice, MD  01/25/2022  10:20 AM

## 2022-02-18 ENCOUNTER — Ambulatory Visit (INDEPENDENT_AMBULATORY_CARE_PROVIDER_SITE_OTHER): Payer: Medicare Other | Admitting: Ophthalmology

## 2022-02-18 ENCOUNTER — Encounter (INDEPENDENT_AMBULATORY_CARE_PROVIDER_SITE_OTHER): Payer: Self-pay | Admitting: Ophthalmology

## 2022-02-18 DIAGNOSIS — H35722 Serous detachment of retinal pigment epithelium, left eye: Secondary | ICD-10-CM

## 2022-02-18 DIAGNOSIS — H353221 Exudative age-related macular degeneration, left eye, with active choroidal neovascularization: Secondary | ICD-10-CM

## 2022-02-18 DIAGNOSIS — H26491 Other secondary cataract, right eye: Secondary | ICD-10-CM

## 2022-02-18 DIAGNOSIS — H353112 Nonexudative age-related macular degeneration, right eye, intermediate dry stage: Secondary | ICD-10-CM

## 2022-02-18 MED ORDER — AFLIBERCEPT 2MG/0.05ML IZ SOLN FOR KALEIDOSCOPE
2.0000 mg | INTRAVITREAL | Status: AC | PRN
  Administered 2022-02-18: 2 mg via INTRAVITREAL

## 2022-02-18 NOTE — Assessment & Plan Note (Signed)
No sign of CNVM 

## 2022-02-18 NOTE — Assessment & Plan Note (Signed)
Vastly improved vision now from count fingers to potentially 20/60 via pinhole today after YAG capsulotomy performed

## 2022-02-18 NOTE — Progress Notes (Signed)
02/18/2022     CHIEF COMPLAINT Patient presents for No chief complaint on file.     HISTORY OF PRESENT ILLNESS: Jonathan [name] is a 86 y.o. male who presents to the clinic today for:   HPI   50mh dilate OS, Eyela, OCT Pt states his vision has been stable Pt states his vision is somewhat better since his procedure Pt denies any new floaters or FOL.  Last edited by PMorene Rankins CMA on 02/18/2022  1:12 PM.      Referring physician: No referring provider defined for this encounter.  HISTORICAL INFORMATION:   Selected notes from the MEDICAL RECORD NUMBER    Lab Results  Component Value Date   HGBA1C 6.7 (H) 07/17/2019     CURRENT MEDICATIONS: No current outpatient medications on file. (Ophthalmic Drugs)   No current facility-administered medications for this visit. (Ophthalmic Drugs)   Current Outpatient Medications (Other)  Medication Sig   allopurinol (ZYLOPRIM) 300 MG tablet Take 1 tablet (300 mg total) by mouth daily.   aspirin EC 81 MG tablet Take 81 mg by mouth daily.   benazepril-hydrochlorthiazide (LOTENSIN HCT) 20-25 MG tablet Take 1 tablet by mouth daily.   cetirizine (ZYRTEC) 10 MG tablet Take 10 mg by mouth daily as needed for allergies.   furosemide (LASIX) 20 MG tablet Take 1 tablet (20 mg total) by mouth daily as needed for fluid or edema.   glipiZIDE (GLUCOTROL) 5 MG tablet Take 1 tablet (5 mg total) by mouth 2 (two) times daily.   glucose blood (FREESTYLE LITE) test strip USE TWICE A DAY AS DIRECTED (E11.9)   insulin glargine (LANTUS) 100 UNIT/ML injection Inject 5-50 Units into the skin 2 (two) times daily. Per sliding scale   levothyroxine (SYNTHROID, LEVOTHROID) 100 MCG tablet Take 1 tablet (100 mcg total) by mouth every morning.   metFORMIN (GLUCOPHAGE) 1000 MG tablet Take 1 tablet (1,000 mg total) by mouth 2 (two) times daily with a meal.   Multiple Vitamin (MULTIVITAMIN PO) Take 1 tablet by mouth daily.     nitroGLYCERIN (NITROSTAT) 0.4 MG  SL tablet Place 1 tablet (0.4 mg total) under the tongue every 5 (five) minutes x 3 doses as needed for chest pain. (Patient taking differently: Place 0.4 mg under the tongue every 5 (five) minutes x 3 doses as needed for chest pain (as needed).)   Omega-3 Fatty Acids (FISH OIL) 1000 MG CAPS Take 1,000 mg by mouth daily.    omeprazole (PRILOSEC) 20 MG capsule Take 1 capsule (20 mg total) by mouth daily as needed (heartburn or acid reflux). (Patient taking differently: Take 20 mg by mouth daily.)   oxybutynin (DITROPAN XL) 15 MG 24 hr tablet Take 15 mg by mouth daily.   oxymetazoline (AFRIN) 0.05 % nasal spray Place 1 spray into both nostrils 2 (two) times daily as needed for congestion.   pioglitazone (ACTOS) 45 MG tablet Take 1 tablet (45 mg total) by mouth daily.   polycarbophil (FIBERCON) 625 MG tablet Take 1,250 mg by mouth 2 (two) times daily.    polyethylene glycol (MIRALAX / GLYCOLAX) packet Take 17 g by mouth daily as needed for moderate constipation.   Probiotic Product (ALIGN) 4 MG CAPS Take 1 capsule (4 mg total) by mouth daily.   simvastatin (ZOCOR) 40 MG tablet Take 1 tablet (40 mg total) by mouth at bedtime.   No current facility-administered medications for this visit. (Other)      REVIEW OF SYSTEMS: ROS   Negative  for: Constitutional, Gastrointestinal, Neurological, Skin, Genitourinary, Musculoskeletal, HENT, Endocrine, Cardiovascular, Eyes, Respiratory, Psychiatric, Allergic/Imm, Heme/Lymph Last edited by Orene Desanctis D, CMA on 02/18/2022  1:12 PM.       ALLERGIES Allergies  Allergen Reactions   Bee Venom Anaphylaxis    PAST MEDICAL HISTORY Past Medical History:  Diagnosis Date   Benign prostatic hypertrophy    CAD (coronary artery disease)    CABG 2005 by Dr Cyndia Bent   DJD (degenerative joint disease)    bilateral knee, gout   GERD (gastroesophageal reflux disease)    HX OF   Glomerulonephritis acute    at age 8, due to beta strep, resolved, renal calculi-  passed spontaneously   Gout    Hepatitis A 1950   Hepatitis B 1980's   History of kidney stones    3-4 TIMES SINCE 1970 PASSED ON OWN   HOH (hard of hearing)    wears hearing aids   Hyperlipidemia    Hypertension    Hypothyroidism    Incontinence of urine    Legally blind in right eye, as defined in [place]    Personal history of other infectious and parasitic disease    Sleep apnea 1990's   "before uvulectomy"   Squamous cell carcinoma    "lots on my arms & face" (09/21/2013)   Sun-damaged skin    Tuberculosis    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 IN MED SCHOOL IN 1961[place] HAD TB   Type II diabetes mellitus ([place])    Past Surgical History:  Procedure Laterality Date   CARDIAC CATHETERIZATION     CATARACT EXTRACTION W/ INTRAOCULAR LENS  IMPLANT, BILATERAL Bilateral    CORONARY ARTERY BYPASS GRAFT  01/23/04   Dr Cyndia Bent   CORONARY STENT INTERVENTION  07/17/2019   CORONARY STENT INTERVENTION N/A 07/17/2019   Procedure: CORONARY STENT INTERVENTION;  Surgeon: Martinique, Peter M, MD;  Location: [facility];  Service: Cardiovascular;  Laterality: N/A;   CYSTOSCOPY WITH INSERTION OF UROLIFT N/A 06/23/2017   Procedure: CYSTOSCOPY WITH INSERTION OF UROLIFT;  Surgeon: Franchot Gallo, MD;  Location: [facility];  Service: Urology;  Laterality: N/A;   EYE SURGERY Right    detached retina ,blind in right eye   INGUINAL HERNIA REPAIR Bilateral 1980's - 1990's   LEFT HEART CATH AND CORONARY ANGIOGRAPHY N/A 07/17/2019   Procedure: LEFT HEART CATH AND CORONARY ANGIOGRAPHY;  Surgeon: Martinique, Peter M, MD;  Location: [facility];  Service: Cardiovascular;  Laterality: N/A;   MASTOIDECTOMY Right 1943   PACEMAKER IMPLANT N/A 09/04/2021   Procedure: PACEMAKER IMPLANT;  Surgeon: Thompson Grayer, MD;  Location: [facility];  Service: Cardiovascular;  Laterality: N/A;   QUADRICEPS REPAIR Right 2009   RETINAL DETACHMENT SURGERY Right X 2   SEPSIS  1943   TONSILLECTOMY  ~ 1943    TOTAL KNEE ARTHROPLASTY Left 09/18/2013   Procedure: TOTAL KNEE ARTHROPLASTY;  Surgeon: Hessie Dibble, MD;  Location: [facility];  Service: Orthopedics;  Laterality: Left;   TOTAL KNEE ARTHROPLASTY Right 08/22/2014   Procedure: RIGHT TOTAL KNEE ARTHROPLASTY;  Surgeon: Hessie Dibble, MD;  Location: [facility];  Service: Orthopedics;  Laterality: Right;   UMBILICAL HERNIA REPAIR     UVULOPALATOPLASTY  74's   ENT MD DR Rock Nephew    FAMILY HISTORY Family History  Problem Relation Age of Onset   Diabetes Other        1st degree relative   Hypertension Other  Family History    SOCIAL HISTORY Social History   Tobacco Use   Smoking status: Former    Packs/day: 1.00    Years: 10.00    Total pack years: 10.00    Types: Cigarettes    Quit date: 09/09/1965    Years since quitting: 56.4   Smokeless tobacco: Never  Vaping Use   Vaping Use: Never used  Substance Use Topics   Alcohol use: Yes    Comment: Glass of wine on social occasion or out to ear   Drug use: No         OPHTHALMIC EXAM:  Base Eye Exam     Visual Acuity (ETDRS)       Right Left   Dist [place] 20/100 20/50   Dist cc 20/60 20/30         Tonometry (Tonopen, 1:18 PM)       Right Left   Pressure 15 18         Pupils       Pupils APD   Right PERRL None   Left PERRL None         Visual Fields       Left Right    Full Full         Extraocular Movement       Right Left    Full, Ortho Full, Ortho         Neuro/Psych     Oriented x3: Yes   Mood/Affect: Normal         Dilation     Left eye: 2.5% Phenylephrine, 1.0% Mydriacyl @ 1:19 PM           Slit Lamp and Fundus Exam     External Exam       Right Left   External Normal Normal         Slit Lamp Exam       Right Left   Lids/Lashes Normal Normal   Conjunctiva/Sclera White and quiet White and quiet   Cornea Clear Clear   Anterior Chamber Deep and quiet Deep and quiet   Iris Round and reactive Round and reactive    Lens Centered posterior chamber intraocular lens, 3+ Posterior capsular opacification Centered posterior chamber intraocular lens   Anterior Vitreous Normal Normal         Fundus Exam       Right Left   Posterior Vitreous  Normal   Disc  Normal   C/D Ratio  0.35   Macula  Central subretinal fluid OS   Vessels  Normal   Periphery  Pavingstone degeneration no holes or tears            IMAGING AND PROCEDURES  Imaging and Procedures for 02/18/22  OCT, Retina - OU - Both Eyes       Right Eye Quality was good. Scan locations included subfoveal. Central Foveal Thickness: 294. Progression has been stable. Findings include normal foveal contour, retinal drusen .   Left Eye Quality was good. Scan locations included subfoveal. Central Foveal Thickness: 248. Progression has improved. Findings include retinal drusen , subretinal fluid.   Notes OD with 3-4+ media opacity corresponds with severe posterior capsule opacity  OS Central subretinal fluid accumulation from wet AMD, still active 1 month after injection #1 will need to repeat with therapy with antivegF Avastin today.      Intravitreal Injection, Pharmacologic Agent - OS - Left Eye       Time Out 02/18/2022. 1:44 PM.  Confirmed correct patient, procedure, site, and patient consented.   Anesthesia Topical anesthesia was used. Anesthetic medications included Lidocaine 4%.   Procedure Preparation included 5% betadine to ocular surface, 10% betadine to eyelids, Tobramycin 0.3%, Ofloxacin . A 30 gauge needle was used.   Injection: 2 mg aflibercept 2 MG/0.05ML   Route: Intravitreal, Site: Left Eye   NDC: [id number], Lot: [id number], Expiration date: 11/08/2022, Waste: 0 mL   Post-op Post injection exam found visual acuity of at least counting fingers. The patient tolerated the procedure well. There were no complications. The patient received written and verbal post procedure care education. Post injection medications  were not given.              ASSESSMENT/PLAN:  Exudative age-related macular degeneration of left eye with active choroidal neovascularization (HCC) OS much improved with much less subretinal fluid centrally post injection antivegF today at 1 month post recent injection.  Repeat injection today using Eylea.  Follow-up again in 4 to 5 weeks  Intermediate stage nonexudative age-related macular degeneration of right eye No sign of CNVM  Posterior capsular opacification, right eye Vastly improved vision now from count fingers to potentially 20/60 via pinhole today after YAG capsulotomy performed  Serous detachment of retinal pigment epithelium, left Component of wet AMD improving     ICD-10-CM   1. Exudative age-related macular degeneration of left eye with active choroidal neovascularization (HCC)  H35.3221 OCT, Retina - OU - Both Eyes    Intravitreal Injection, Pharmacologic Agent - OS - Left Eye    aflibercept (EYLEA) SOLN 2 mg    2. Intermediate stage nonexudative age-related macular degeneration of right eye  H35.3112     3. Posterior capsular opacification, right eye  H26.491     4. Serous detachment of retinal pigment epithelium, left  H35.722       1.  Wet AMD OS.  Much less subretinal fluid component of disease today.  At 1 month follow-up.  We will repeat antivegF, use Eylea OS today and reevaluate again in 4 to 5 weeks  2.  OD vastly improved acuity post YAG capsulotomy  3.  Okay to proceed with root canal later today  Ophthalmic Meds Ordered this visit:  Meds ordered this encounter  Medications   aflibercept (EYLEA) SOLN 2 mg       Return in about 5 weeks (around 03/25/2022) for dilate, OS, EYLEA OCT.  There are no Patient Instructions on file for this visit.   Explained the diagnoses, plan, and follow up with the patient and they expressed understanding.  Patient expressed understanding of the importance of proper follow up care.   Clent Demark Xan Sparkman  M.D. Diseases & Surgery of the Retina and Vitreous Retina [facility] Diabetic [place] 02/18/22     Abbreviations: M myopia (nearsighted); A astigmatism; H hyperopia (farsighted); P presbyopia; Mrx spectacle prescription;  CTL contact lenses; OD right eye; OS left eye; OU both eyes  XT exotropia; ET esotropia; PEK punctate epithelial keratitis; PEE punctate epithelial erosions; DES dry eye syndrome; MGD meibomian gland dysfunction; ATs artificial tears; PFAT's preservative free artificial tears; [place] nuclear sclerotic cataract; PSC posterior subcapsular cataract; ERM epi-retinal membrane; PVD posterior vitreous detachment; RD retinal detachment; DM diabetes mellitus; DR diabetic retinopathy; NPDR non-proliferative diabetic retinopathy; PDR proliferative diabetic retinopathy; CSME clinically significant macular edema; DME diabetic macular edema; dbh dot blot hemorrhages; CWS cotton wool spot; POAG primary open angle glaucoma; C/D cup-to-disc ratio; HVF humphrey visual field; GVF goldmann visual field; OCT optical  coherence tomography; IOP intraocular pressure; BRVO Branch retinal vein occlusion; CRVO central retinal vein occlusion; CRAO central retinal artery occlusion; BRAO branch retinal artery occlusion; RT retinal tear; SB scleral buckle; PPV pars plana vitrectomy; VH Vitreous hemorrhage; PRP panretinal laser photocoagulation; IVK intravitreal kenalog; VMT vitreomacular traction; MH Macular hole;  NVD neovascularization of the disc; NVE neovascularization elsewhere; AREDS age related eye disease study; ARMD age related macular degeneration; POAG primary open angle glaucoma; EBMD epithelial/anterior basement membrane dystrophy; ACIOL anterior chamber intraocular lens; IOL intraocular lens; PCIOL posterior chamber intraocular lens; Phaco/IOL phacoemulsification with intraocular lens placement; [place] photorefractive keratectomy; LASIK laser assisted in situ keratomileusis; HTN hypertension; DM diabetes mellitus; COPD  chronic obstructive pulmonary disease

## 2022-02-18 NOTE — Assessment & Plan Note (Signed)
Component of wet AMD improving

## 2022-02-18 NOTE — Assessment & Plan Note (Signed)
OS much improved with much less subretinal fluid centrally post injection antivegF today at 1 month post recent injection.  Repeat injection today using Eylea.  Follow-up again in 4 to 5 weeks

## 2022-03-15 ENCOUNTER — Ambulatory Visit (INDEPENDENT_AMBULATORY_CARE_PROVIDER_SITE_OTHER): Payer: Medicare Other

## 2022-03-15 DIAGNOSIS — I440 Atrioventricular block, first degree: Secondary | ICD-10-CM

## 2022-03-22 LAB — CUP PACEART REMOTE DEVICE CHECK
Battery Remaining Longevity: 147 mo
Battery Voltage: 3.1 V
Brady Statistic AP VP Percent: 30.11 %
Brady Statistic AP VS Percent: 0.01 %
Brady Statistic AS VP Percent: 69.02 %
Brady Statistic AS VS Percent: 0.87 %
Brady Statistic RA Percent Paced: 30.07 %
Brady Statistic RV Percent Paced: 99.13 %
Date Time Interrogation Session: 20230813084106
Implantable Lead Implant Date: 20230127
Implantable Lead Implant Date: 20230127
Implantable Lead Location: 753859
Implantable Lead Location: 753860
Implantable Lead Model: 3830
Implantable Lead Model: 5076
Implantable Pulse Generator Implant Date: 20230127
Lead Channel Impedance Value: 266 Ohm
Lead Channel Impedance Value: 399 Ohm
Lead Channel Impedance Value: 399 Ohm
Lead Channel Impedance Value: 570 Ohm
Lead Channel Pacing Threshold Amplitude: 0.625 V
Lead Channel Pacing Threshold Amplitude: 0.875 V
Lead Channel Pacing Threshold Pulse Width: 0.4 ms
Lead Channel Pacing Threshold Pulse Width: 0.4 ms
Lead Channel Sensing Intrinsic Amplitude: 1.125 mV
Lead Channel Sensing Intrinsic Amplitude: 1.125 mV
Lead Channel Sensing Intrinsic Amplitude: 14.75 mV
Lead Channel Sensing Intrinsic Amplitude: 14.75 mV
Lead Channel Setting Pacing Amplitude: 1.5 V
Lead Channel Setting Pacing Amplitude: 2 V
Lead Channel Setting Pacing Pulse Width: 0.4 ms
Lead Channel Setting Sensing Sensitivity: 0.9 mV

## 2022-03-25 ENCOUNTER — Ambulatory Visit (INDEPENDENT_AMBULATORY_CARE_PROVIDER_SITE_OTHER): Payer: Medicare Other | Admitting: Ophthalmology

## 2022-03-25 ENCOUNTER — Encounter (INDEPENDENT_AMBULATORY_CARE_PROVIDER_SITE_OTHER): Payer: Self-pay | Admitting: Ophthalmology

## 2022-03-25 DIAGNOSIS — H353221 Exudative age-related macular degeneration, left eye, with active choroidal neovascularization: Secondary | ICD-10-CM

## 2022-03-25 MED ORDER — AFLIBERCEPT 2MG/0.05ML IZ SOLN FOR KALEIDOSCOPE
2.0000 mg | INTRAVITREAL | Status: AC | PRN
  Administered 2022-03-25: 2 mg via INTRAVITREAL

## 2022-03-25 NOTE — Assessment & Plan Note (Addendum)
OS, with subretinal fluid overall improved yet still active today at 5 weeks post Eylea.  Will need repeat injection today to maintain.   Follow-up again next in 5 weeks

## 2022-03-25 NOTE — Progress Notes (Signed)
03/25/2022     CHIEF COMPLAINT Patient presents for  Chief Complaint  Patient presents with   Macular Degeneration      HISTORY OF PRESENT ILLNESS: Jonathan [name] is a 86 y.o. male who presents to the clinic today for:   HPI   5 weeks for DILATE OS, EYLEA, OCT. Pt stated vision has remained stable since last visit.  Last edited by Silvestre Moment on 03/25/2022  2:07 PM.      Referring physician: No referring provider defined for this encounter.  HISTORICAL INFORMATION:   Selected notes from the MEDICAL RECORD NUMBER    Lab Results  Component Value Date   HGBA1C 6.7 (H) 07/17/2019     CURRENT MEDICATIONS: No current outpatient medications on file. (Ophthalmic Drugs)   No current facility-administered medications for this visit. (Ophthalmic Drugs)   Current Outpatient Medications (Other)  Medication Sig   allopurinol (ZYLOPRIM) 300 MG tablet Take 1 tablet (300 mg total) by mouth daily.   aspirin EC 81 MG tablet Take 81 mg by mouth daily.   benazepril-hydrochlorthiazide (LOTENSIN HCT) 20-25 MG tablet Take 1 tablet by mouth daily.   cetirizine (ZYRTEC) 10 MG tablet Take 10 mg by mouth daily as needed for allergies.   furosemide (LASIX) 20 MG tablet Take 1 tablet (20 mg total) by mouth daily as needed for fluid or edema.   glipiZIDE (GLUCOTROL) 5 MG tablet Take 1 tablet (5 mg total) by mouth 2 (two) times daily.   glucose blood (FREESTYLE LITE) test strip USE TWICE A DAY AS DIRECTED (E11.9)   insulin glargine (LANTUS) 100 UNIT/ML injection Inject 5-50 Units into the skin 2 (two) times daily. Per sliding scale   levothyroxine (SYNTHROID, LEVOTHROID) 100 MCG tablet Take 1 tablet (100 mcg total) by mouth every morning.   metFORMIN (GLUCOPHAGE) 1000 MG tablet Take 1 tablet (1,000 mg total) by mouth 2 (two) times daily with a meal.   Multiple Vitamin (MULTIVITAMIN PO) Take 1 tablet by mouth daily.     nitroGLYCERIN (NITROSTAT) 0.4 MG SL tablet Place 1 tablet (0.4 mg total) under  the tongue every 5 (five) minutes x 3 doses as needed for chest pain. (Patient taking differently: Place 0.4 mg under the tongue every 5 (five) minutes x 3 doses as needed for chest pain (as needed).)   Omega-3 Fatty Acids (FISH OIL) 1000 MG CAPS Take 1,000 mg by mouth daily.    omeprazole (PRILOSEC) 20 MG capsule Take 1 capsule (20 mg total) by mouth daily as needed (heartburn or acid reflux). (Patient taking differently: Take 20 mg by mouth daily.)   oxybutynin (DITROPAN XL) 15 MG 24 hr tablet Take 15 mg by mouth daily.   oxymetazoline (AFRIN) 0.05 % nasal spray Place 1 spray into both nostrils 2 (two) times daily as needed for congestion.   pioglitazone (ACTOS) 45 MG tablet Take 1 tablet (45 mg total) by mouth daily.   polycarbophil (FIBERCON) 625 MG tablet Take 1,250 mg by mouth 2 (two) times daily.    polyethylene glycol (MIRALAX / GLYCOLAX) packet Take 17 g by mouth daily as needed for moderate constipation.   Probiotic Product (ALIGN) 4 MG CAPS Take 1 capsule (4 mg total) by mouth daily.   simvastatin (ZOCOR) 40 MG tablet Take 1 tablet (40 mg total) by mouth at bedtime.   No current facility-administered medications for this visit. (Other)      REVIEW OF SYSTEMS: ROS   Negative for: Constitutional, Gastrointestinal, Neurological, Skin, Genitourinary, Musculoskeletal, HENT,  Endocrine, Cardiovascular, Eyes, Respiratory, Psychiatric, Allergic/Imm, Heme/Lymph Last edited by Silvestre Moment on 03/25/2022  2:07 PM.       ALLERGIES Allergies  Allergen Reactions   Bee Venom Anaphylaxis    PAST MEDICAL HISTORY Past Medical History:  Diagnosis Date   Benign prostatic hypertrophy    CAD (coronary artery disease)    CABG 2005 by Dr Cyndia Bent   DJD (degenerative joint disease)    bilateral knee, gout   GERD (gastroesophageal reflux disease)    HX OF   Glomerulonephritis acute    at age 50, due to beta strep, resolved, renal calculi- passed spontaneously   Gout    Hepatitis A 1950    Hepatitis B 1980's   History of kidney stones    3-4 TIMES SINCE 1970 PASSED ON OWN   HOH (hard of hearing)    wears hearing aids   Hyperlipidemia    Hypertension    Hypothyroidism    Incontinence of urine    Legally blind in right eye, as defined in [place]    Personal history of other infectious and parasitic disease    Sleep apnea 1990's   "before uvulectomy"   Squamous cell carcinoma    "lots on my arms & face" (09/21/2013)   Sun-damaged skin    Tuberculosis    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 IN MED SCHOOL IN 1961[place] HAD TB   Type II diabetes mellitus ([place])    Past Surgical History:  Procedure Laterality Date   CARDIAC CATHETERIZATION     CATARACT EXTRACTION W/ INTRAOCULAR LENS  IMPLANT, BILATERAL Bilateral    CORONARY ARTERY BYPASS GRAFT  01/23/04   Dr Cyndia Bent   CORONARY STENT INTERVENTION  07/17/2019   CORONARY STENT INTERVENTION N/A 07/17/2019   Procedure: CORONARY STENT INTERVENTION;  Surgeon: Martinique, Peter M, MD;  Location: [facility];  Service: Cardiovascular;  Laterality: N/A;   CYSTOSCOPY WITH INSERTION OF UROLIFT N/A 06/23/2017   Procedure: CYSTOSCOPY WITH INSERTION OF UROLIFT;  Surgeon: Franchot Gallo, MD;  Location: [facility];  Service: Urology;  Laterality: N/A;   EYE SURGERY Right    detached retina ,blind in right eye   INGUINAL HERNIA REPAIR Bilateral 1980's - 1990's   LEFT HEART CATH AND CORONARY ANGIOGRAPHY N/A 07/17/2019   Procedure: LEFT HEART CATH AND CORONARY ANGIOGRAPHY;  Surgeon: Martinique, Peter M, MD;  Location: [place];  Service: Cardiovascular;  Laterality: N/A;   MASTOIDECTOMY Right 1943   PACEMAKER IMPLANT N/A 09/04/2021   Procedure: PACEMAKER IMPLANT;  Surgeon: Thompson Grayer, MD;  Location: [place] [facility];  Service: Cardiovascular;  Laterality: N/A;   QUADRICEPS REPAIR Right 2009   RETINAL DETACHMENT SURGERY Right X 2   SEPSIS  1943   TONSILLECTOMY  ~ 1943   TOTAL KNEE ARTHROPLASTY Left 09/18/2013    Procedure: TOTAL KNEE ARTHROPLASTY;  Surgeon: Hessie Dibble, MD;  Location: [place];  Service: Orthopedics;  Laterality: Left;   TOTAL KNEE ARTHROPLASTY Right 08/22/2014   Procedure: RIGHT TOTAL KNEE ARTHROPLASTY;  Surgeon: Hessie Dibble, MD;  Location: [facility];  Service: Orthopedics;  Laterality: Right;   UMBILICAL HERNIA REPAIR     UVULOPALATOPLASTY  74's   ENT MD DR Rock Nephew    FAMILY HISTORY Family History  Problem Relation Age of Onset   Diabetes Other        1st degree relative   Hypertension Other        Family History    SOCIAL HISTORY Social  History   Tobacco Use   Smoking status: Former    Packs/day: 1.00    Years: 10.00    Total pack years: 10.00    Types: Cigarettes    Quit date: 09/09/1965    Years since quitting: 56.5   Smokeless tobacco: Never  Vaping Use   Vaping Use: Never used  Substance Use Topics   Alcohol use: Yes    Comment: Glass of wine on social occasion or out to ear   Drug use: No         OPHTHALMIC EXAM:  Base Eye Exam     Visual Acuity (ETDRS)       Right Left   Dist [place] 20/150 20/70 -1   Dist ph [place] 20/70 -1 20/40 -1         Tonometry (Tonopen, 2:14 PM)       Right Left   Pressure 15 16         Pupils       Pupils APD   Right PERRL None   Left PERRL None         Visual Fields       Left Right    Full    Restrictions  Partial outer superior temporal, inferior temporal deficiencies         Extraocular Movement       Right Left    Full, Ortho Full, Ortho         Neuro/Psych     Oriented x3: Yes   Mood/Affect: Normal         Dilation     Left eye: 2.5% Phenylephrine, 1.0% Mydriacyl @ 2:14 PM           Slit Lamp and Fundus Exam     External Exam       Right Left   External Normal Normal         Slit Lamp Exam       Right Left   Lids/Lashes Normal Normal   Conjunctiva/Sclera White and quiet White and quiet   Cornea Clear Clear   Anterior Chamber Deep and quiet Deep and quiet    Iris Round and reactive Round and reactive   Lens Centered posterior chamber intraocular lens, 3+ Posterior capsular opacification Centered posterior chamber intraocular lens   Anterior Vitreous Normal Normal         Fundus Exam       Right Left   Posterior Vitreous  Normal   Disc  Normal   C/D Ratio  0.35   Macula  Central subretinal fluid OS   Vessels  Normal   Periphery  Pavingstone degeneration no holes or tears            IMAGING AND PROCEDURES  Imaging and Procedures for 03/25/22  OCT, Retina - OU - Both Eyes       Right Eye Quality was good. Scan locations included subfoveal. Central Foveal Thickness: 294. Progression has been stable. Findings include normal foveal contour, retinal drusen .   Left Eye Quality was good. Scan locations included subfoveal. Central Foveal Thickness: 265. Progression has been stable. Findings include retinal drusen , subretinal fluid.   Notes  Clear media  OS Central subretinal fluid accumulation from wet AMD, still active 1 month after injection #3 will need to repeat with therapy with antivegF Avastin today.      Intravitreal Injection, Pharmacologic Agent - OS - Left Eye       Time Out 03/25/2022. 2:33 PM.  Confirmed correct patient, procedure, site, and patient consented.   Anesthesia Topical anesthesia was used. Anesthetic medications included Lidocaine 4%.   Procedure Preparation included 5% betadine to ocular surface, 10% betadine to eyelids, Tobramycin 0.3%, Ofloxacin . A 30 gauge needle was used.   Injection: 2 mg aflibercept 2 MG/0.05ML   Route: Intravitreal, Site: Left Eye   NDC: [id number], Lot: [id number], Expiration date: 04/11/2023, Waste: 0 mL   Post-op Post injection exam found visual acuity of at least counting fingers. The patient tolerated the procedure well. There were no complications. The patient received written and verbal post procedure care education. Post injection medications were not given.               ASSESSMENT/PLAN:  Exudative age-related macular degeneration of left eye with active choroidal neovascularization (HCC) OS, with subretinal fluid overall improved yet still active today at 5 weeks post Eylea.  Will need repeat injection today to maintain.   Follow-up again next in 5 weeks     ICD-10-CM   1. Exudative age-related macular degeneration of left eye with active choroidal neovascularization (HCC)  H35.3221 OCT, Retina - OU - Both Eyes    Intravitreal Injection, Pharmacologic Agent - OS - Left Eye    aflibercept (EYLEA) SOLN 2 mg      1.  OS much improved today but still active subfoveal CNVM with subretinal fluid.  Stable acuity.  Repeat Eylea today reevaluate next again in 5 weeks  2.  3.  Ophthalmic Meds Ordered this visit:  Meds ordered this encounter  Medications   aflibercept (EYLEA) SOLN 2 mg       Return in about 5 weeks (around 04/29/2022) for dilate, OS, EYLEA OCT.  There are no Patient Instructions on file for this visit.   Explained the diagnoses, plan, and follow up with the patient and they expressed understanding.  Patient expressed understanding of the importance of proper follow up care.   Clent Demark Anah Billard M.D. Diseases & Surgery of the Retina and Vitreous [facility] [place] 03/25/22     Abbreviations: M myopia (nearsighted); A astigmatism; H hyperopia (farsighted); P presbyopia; Mrx spectacle prescription;  CTL contact lenses; OD right eye; OS left eye; OU both eyes  XT exotropia; ET esotropia; PEK punctate epithelial keratitis; PEE punctate epithelial erosions; DES dry eye syndrome; MGD meibomian gland dysfunction; ATs artificial tears; PFAT's preservative free artificial tears; [place] nuclear sclerotic cataract; PSC posterior subcapsular cataract; ERM epi-retinal membrane; PVD posterior vitreous detachment; RD retinal detachment; DM diabetes mellitus; DR diabetic retinopathy; NPDR non-proliferative diabetic retinopathy;  PDR proliferative diabetic retinopathy; CSME clinically significant macular edema; DME diabetic macular edema; dbh dot blot hemorrhages; CWS cotton wool spot; POAG primary open angle glaucoma; C/D cup-to-disc ratio; HVF humphrey visual field; GVF goldmann visual field; OCT optical coherence tomography; IOP intraocular pressure; BRVO Branch retinal vein occlusion; CRVO central retinal vein occlusion; CRAO central retinal artery occlusion; BRAO branch retinal artery occlusion; RT retinal tear; SB scleral buckle; PPV pars plana vitrectomy; VH Vitreous hemorrhage; PRP panretinal laser photocoagulation; IVK intravitreal kenalog; VMT vitreomacular traction; MH Macular hole;  NVD neovascularization of the disc; NVE neovascularization elsewhere; AREDS age related eye disease study; ARMD age related macular degeneration; POAG primary open angle glaucoma; EBMD epithelial/anterior basement membrane dystrophy; ACIOL anterior chamber intraocular lens; IOL intraocular lens; PCIOL posterior chamber intraocular lens; Phaco/IOL phacoemulsification with intraocular lens placement; [place] photorefractive keratectomy; LASIK laser assisted in situ keratomileusis; HTN hypertension; DM diabetes mellitus; COPD chronic obstructive pulmonary disease

## 2022-04-15 NOTE — Progress Notes (Signed)
[facility] Summary Report / Loop Recorder 

## 2022-04-29 ENCOUNTER — Ambulatory Visit (INDEPENDENT_AMBULATORY_CARE_PROVIDER_SITE_OTHER): Payer: Medicare Other | Admitting: Ophthalmology

## 2022-04-29 ENCOUNTER — Encounter (INDEPENDENT_AMBULATORY_CARE_PROVIDER_SITE_OTHER): Payer: Self-pay | Admitting: Ophthalmology

## 2022-04-29 DIAGNOSIS — H26491 Other secondary cataract, right eye: Secondary | ICD-10-CM

## 2022-04-29 DIAGNOSIS — H353112 Nonexudative age-related macular degeneration, right eye, intermediate dry stage: Secondary | ICD-10-CM | POA: Diagnosis not present

## 2022-04-29 DIAGNOSIS — H353221 Exudative age-related macular degeneration, left eye, with active choroidal neovascularization: Secondary | ICD-10-CM

## 2022-04-29 NOTE — Assessment & Plan Note (Signed)
Condition resolved acuity improved

## 2022-04-29 NOTE — Progress Notes (Addendum)
04/29/2022     CHIEF COMPLAINT Patient presents for  Chief Complaint  Patient presents with   Macular Degeneration      HISTORY OF PRESENT ILLNESS: Jonathan [name] is a 86 y.o. male who presents to the clinic today for:   HPI   Age related macular degeneration of left eye 5 weeks dilate os eylea oct Pt states his vision has been stable Pt denies any new floaters or FOL.  Last edited by Morene Rankins, CMA on 04/29/2022  1:13 PM.      Referring physician: No referring provider defined for this encounter.  HISTORICAL INFORMATION:   Selected notes from the MEDICAL RECORD NUMBER    Lab Results  Component Value Date   HGBA1C 6.7 (H) 07/17/2019     CURRENT MEDICATIONS: No current outpatient medications on file. (Ophthalmic Drugs)   No current facility-administered medications for this visit. (Ophthalmic Drugs)   Current Outpatient Medications (Other)  Medication Sig   allopurinol (ZYLOPRIM) 300 MG tablet Take 1 tablet (300 mg total) by mouth daily.   aspirin EC 81 MG tablet Take 81 mg by mouth daily.   benazepril-hydrochlorthiazide (LOTENSIN HCT) 20-25 MG tablet Take 1 tablet by mouth daily.   cetirizine (ZYRTEC) 10 MG tablet Take 10 mg by mouth daily as needed for allergies.   furosemide (LASIX) 20 MG tablet Take 1 tablet (20 mg total) by mouth daily as needed for fluid or edema.   glipiZIDE (GLUCOTROL) 5 MG tablet Take 1 tablet (5 mg total) by mouth 2 (two) times daily.   glucose blood (FREESTYLE LITE) test strip USE TWICE A DAY AS DIRECTED (E11.9)   insulin glargine (LANTUS) 100 UNIT/ML injection Inject 5-50 Units into the skin 2 (two) times daily. Per sliding scale   levothyroxine (SYNTHROID, LEVOTHROID) 100 MCG tablet Take 1 tablet (100 mcg total) by mouth every morning.   metFORMIN (GLUCOPHAGE) 1000 MG tablet Take 1 tablet (1,000 mg total) by mouth 2 (two) times daily with a meal.   Multiple Vitamin (MULTIVITAMIN PO) Take 1 tablet by mouth daily.      nitroGLYCERIN (NITROSTAT) 0.4 MG SL tablet Place 1 tablet (0.4 mg total) under the tongue every 5 (five) minutes x 3 doses as needed for chest pain. (Patient taking differently: Place 0.4 mg under the tongue every 5 (five) minutes x 3 doses as needed for chest pain (as needed).)   Omega-3 Fatty Acids (FISH OIL) 1000 MG CAPS Take 1,000 mg by mouth daily.    omeprazole (PRILOSEC) 20 MG capsule Take 1 capsule (20 mg total) by mouth daily as needed (heartburn or acid reflux). (Patient taking differently: Take 20 mg by mouth daily.)   oxybutynin (DITROPAN XL) 15 MG 24 hr tablet Take 15 mg by mouth daily.   oxymetazoline (AFRIN) 0.05 % nasal spray Place 1 spray into both nostrils 2 (two) times daily as needed for congestion.   pioglitazone (ACTOS) 45 MG tablet Take 1 tablet (45 mg total) by mouth daily.   polycarbophil (FIBERCON) 625 MG tablet Take 1,250 mg by mouth 2 (two) times daily.    polyethylene glycol (MIRALAX / GLYCOLAX) packet Take 17 g by mouth daily as needed for moderate constipation.   Probiotic Product (ALIGN) 4 MG CAPS Take 1 capsule (4 mg total) by mouth daily.   simvastatin (ZOCOR) 40 MG tablet Take 1 tablet (40 mg total) by mouth at bedtime.   No current facility-administered medications for this visit. (Other)      REVIEW OF  SYSTEMS: ROS   Negative for: Constitutional, Gastrointestinal, Neurological, Skin, Genitourinary, Musculoskeletal, HENT, Endocrine, Cardiovascular, Eyes, Respiratory, Psychiatric, Allergic/Imm, Heme/Lymph Last edited by Morene Rankins, CMA on 04/29/2022  1:13 PM.       ALLERGIES Allergies  Allergen Reactions   Bee Venom Anaphylaxis    PAST MEDICAL HISTORY Past Medical History:  Diagnosis Date   Benign prostatic hypertrophy    CAD (coronary artery disease)    CABG 2005 by Dr Cyndia Bent   DJD (degenerative joint disease)    bilateral knee, gout   GERD (gastroesophageal reflux disease)    HX OF   Glomerulonephritis acute    at age 52, due to beta  strep, resolved, renal calculi- passed spontaneously   Gout    Hepatitis A 1950   Hepatitis B 1980's   History of kidney stones    3-4 TIMES SINCE 1970 PASSED ON OWN   HOH (hard of hearing)    wears hearing aids   Hyperlipidemia    Hypertension    Hypothyroidism    Incontinence of urine    Legally blind in right eye, as defined in [place]    Personal history of other infectious and parasitic disease    Sleep apnea 1990's   "before uvulectomy"   Squamous cell carcinoma    "lots on my arms & face" (09/21/2013)   Sun-damaged skin    Tuberculosis    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 IN MED SCHOOL IN 1961[place] HAD TB   Type II diabetes mellitus ([place])    Past Surgical History:  Procedure Laterality Date   CARDIAC CATHETERIZATION     CATARACT EXTRACTION W/ INTRAOCULAR LENS  IMPLANT, BILATERAL Bilateral    CORONARY ARTERY BYPASS GRAFT  01/23/04   Dr Cyndia Bent   CORONARY STENT INTERVENTION  07/17/2019   CORONARY STENT INTERVENTION N/A 07/17/2019   Procedure: CORONARY STENT INTERVENTION;  Surgeon: Martinique, Peter M, MD;  Location: [facility];  Service: Cardiovascular;  Laterality: N/A;   CYSTOSCOPY WITH INSERTION OF UROLIFT N/A 06/23/2017   Procedure: CYSTOSCOPY WITH INSERTION OF UROLIFT;  Surgeon: Franchot Gallo, MD;  [facility];  Service: Urology;  Laterality: N/A;   EYE SURGERY Right    detached retina ,blind in right eye   INGUINAL HERNIA REPAIR Bilateral 1980's - 1990's   LEFT HEART CATH AND CORONARY ANGIOGRAPHY N/A 07/17/2019   Procedure: LEFT HEART CATH AND CORONARY ANGIOGRAPHY;  Surgeon: Martinique, Peter M, MD;  Location: [place] [facility];  Service: Cardiovascular;  Laterality: N/A;   MASTOIDECTOMY Right 1943   PACEMAKER IMPLANT N/A 09/04/2021   Procedure: PACEMAKER IMPLANT;  Surgeon: Thompson Grayer, MD;  Location: [place] [facility];  Service: Cardiovascular;  Laterality: N/A;   QUADRICEPS REPAIR Right 2009   RETINAL DETACHMENT SURGERY Right X 2   SEPSIS   1943   TONSILLECTOMY  ~ 1943   TOTAL KNEE ARTHROPLASTY Left 09/18/2013   Procedure: TOTAL KNEE ARTHROPLASTY;  Surgeon: Hessie Dibble, MD;  Location: [place];  Service: Orthopedics;  Laterality: Left;   TOTAL KNEE ARTHROPLASTY Right 08/22/2014   Procedure: RIGHT TOTAL KNEE ARTHROPLASTY;  Surgeon: Hessie Dibble, MD;  Location: [place];  Service: Orthopedics;  Laterality: Right;   UMBILICAL HERNIA REPAIR     UVULOPALATOPLASTY  67's   ENT MD DR Rock Nephew    FAMILY HISTORY Family History  Problem Relation Age of Onset   Diabetes Other        1st degree relative   Hypertension Other  Family History    SOCIAL HISTORY Social History   Tobacco Use   Smoking status: Former    Packs/day: 1.00    Years: 10.00    Total pack years: 10.00    Types: Cigarettes    Quit date: 09/09/1965    Years since quitting: 56.6   Smokeless tobacco: Never  Vaping Use   Vaping Use: Never used  Substance Use Topics   Alcohol use: Yes    Comment: Glass of wine on social occasion or out to ear   Drug use: No         OPHTHALMIC EXAM:  Base Eye Exam     Visual Acuity (ETDRS)       Right Left   Dist [place]  20/30 -2   Dist ph [place] 20/60 -1          Tonometry (Tonopen, 1:21 PM)       Right Left   Pressure 13 14         Pupils       Pupils APD   Right PERRL None   Left PERRL None         Visual Fields       Left Right    Full Full         Extraocular Movement       Right Left    Full, Ortho Full, Ortho         Neuro/Psych     Oriented x3: Yes   Mood/Affect: Normal         Dilation     Left eye: 2.5% Phenylephrine, 1.0% Mydriacyl @ 1:15 PM           Slit Lamp and Fundus Exam     External Exam       Right Left   External Normal Normal         Slit Lamp Exam       Right Left   Lids/Lashes Normal Normal   Conjunctiva/Sclera White and quiet White and quiet   Cornea Clear Clear   Anterior Chamber Deep and quiet Deep and quiet   Iris Round and  reactive Round and reactive   Lens Centered posterior chamber intraocular lens, 3+ Posterior capsular opacification Centered posterior chamber intraocular lens   Anterior Vitreous Normal Normal         Fundus Exam       Right Left   Posterior Vitreous  Normal   Disc  Normal   C/D Ratio  0.35   Macula  Central subretinal fluid OS   Vessels  Normal   Periphery  Pavingstone degeneration no holes or tears            IMAGING AND PROCEDURES  Imaging and Procedures for 04/30/22  OCT, Retina - OU - Both Eyes       Right Eye Quality was good. Scan locations included subfoveal. Central Foveal Thickness: 294. Progression has been stable. Findings include normal foveal contour, retinal drusen .   Left Eye Quality was good. Scan locations included subfoveal. Central Foveal Thickness: 267. Progression has been stable. Findings include retinal drusen , subretinal fluid.   Notes  Clear media  OS Central subretinal fluid accumulation from wet AMD, still active 1 month after injection #4 will need to repeat with therapy with antivegF Avastin today.     Intravitreal Injection, Pharmacologic Agent - OS - Left Eye       Time Out 04/29/2022. 11:28 AM. Confirmed correct patient, procedure, site,  and patient consented.   Anesthesia Topical anesthesia was used. Anesthetic medications included Lidocaine 4%.   Procedure Preparation included 5% betadine to ocular surface, 10% betadine to eyelids, Tobramycin 0.3%, Ofloxacin . A 30 gauge needle was used.   Injection: 2 mg aflibercept 2 MG/0.05ML   Route: Intravitreal, Site: Left Eye   NDC: [id number], Lot: [id number], Expiration date: 04/11/2023, Waste: 0 mL   Post-op Post injection exam found visual acuity of at least counting fingers. The patient tolerated the procedure well. There were no complications. The patient received written and verbal post procedure care education. Post injection medications were not given.               ASSESSMENT/PLAN:  Intermediate stage nonexudative age-related macular degeneration of right eye No sign of CNVM OD  Posterior capsular opacification, right eye Condition resolved acuity improved  Exudative age-related macular degeneration of left eye with active choroidal neovascularization (HCC) Persistent subretinal fluid yet stable over time.  Currently at 5-week interval.     ICD-10-CM   1. Exudative age-related macular degeneration of left eye with active choroidal neovascularization (HCC)  H35.3221 OCT, Retina - OU - Both Eyes    Intravitreal Injection, Pharmacologic Agent - OS - Left Eye    aflibercept (EYLEA) SOLN 2 mg    2. Intermediate stage nonexudative age-related macular degeneration of right eye  H35.3112     3. Posterior capsular opacification, right eye  H26.491       1.  Repeat intravitreal Eylea today, injection total #4.  Thereafter we will extend interval examination next and consider extending interval of exam further to 8 weeks due to the length of action of this medication, in the future  2.  3.  Ophthalmic Meds Ordered this visit:  Meds ordered this encounter  Medications   aflibercept (EYLEA) SOLN 2 mg       Return in about 6 weeks (around 06/10/2022) for dilate, OS, EYLEA OCT.  There are no Patient Instructions on file for this visit.   Explained the diagnoses, plan, and follow up with the patient and they expressed understanding.  Patient expressed understanding of the importance of proper follow up care.   Clent Demark Gisella Alwine M.D. Diseases & Surgery of the Retina and Vitreous Retina & Diabetic [place] 04/30/22     Abbreviations: M myopia (nearsighted); A astigmatism; H hyperopia (farsighted); P presbyopia; Mrx spectacle prescription;  CTL contact lenses; OD right eye; OS left eye; OU both eyes  XT exotropia; ET esotropia; PEK punctate epithelial keratitis; PEE punctate epithelial erosions; DES dry eye syndrome; MGD meibomian gland  dysfunction; ATs artificial tears; PFAT's preservative free artificial tears; [place] nuclear sclerotic cataract; PSC posterior subcapsular cataract; ERM epi-retinal membrane; PVD posterior vitreous detachment; RD retinal detachment; DM diabetes mellitus; DR diabetic retinopathy; NPDR non-proliferative diabetic retinopathy; PDR proliferative diabetic retinopathy; CSME clinically significant macular edema; DME diabetic macular edema; dbh dot blot hemorrhages; CWS cotton wool spot; POAG primary open angle glaucoma; C/D cup-to-disc ratio; HVF humphrey visual field; GVF goldmann visual field; OCT optical coherence tomography; IOP intraocular pressure; BRVO Branch retinal vein occlusion; CRVO central retinal vein occlusion; CRAO central retinal artery occlusion; BRAO branch retinal artery occlusion; RT retinal tear; SB scleral buckle; PPV pars plana vitrectomy; VH Vitreous hemorrhage; PRP panretinal laser photocoagulation; IVK intravitreal kenalog; VMT vitreomacular traction; MH Macular hole;  NVD neovascularization of the disc; NVE neovascularization elsewhere; AREDS age related eye disease study; ARMD age related macular degeneration; POAG primary open angle glaucoma; EBMD epithelial/anterior basement  membrane dystrophy; ACIOL anterior chamber intraocular lens; IOL intraocular lens; PCIOL posterior chamber intraocular lens; Phaco/IOL phacoemulsification with intraocular lens placement; [place] photorefractive keratectomy; LASIK laser assisted in situ keratomileusis; HTN hypertension; DM diabetes mellitus; COPD chronic obstructive pulmonary disease

## 2022-04-29 NOTE — Assessment & Plan Note (Signed)
No sign of CNVM OD 

## 2022-04-29 NOTE — Assessment & Plan Note (Signed)
Persistent subretinal fluid yet stable over time.  Currently at 5-week interval.

## 2022-04-30 DIAGNOSIS — H353221 Exudative age-related macular degeneration, left eye, with active choroidal neovascularization: Secondary | ICD-10-CM | POA: Diagnosis not present

## 2022-04-30 MED ORDER — AFLIBERCEPT 2MG/0.05ML IZ SOLN FOR KALEIDOSCOPE
2.0000 mg | INTRAVITREAL | Status: AC | PRN
  Administered 2022-04-30: 2 mg via INTRAVITREAL

## 2022-04-30 NOTE — Addendum Note (Signed)
Addended by: Deloria Lair A on: 04/30/2022 06:29 PM   Modules accepted: Orders

## 2022-05-27 ENCOUNTER — Inpatient Hospital Stay: Payer: Medicare Other | Admitting: Oncology

## 2022-06-10 ENCOUNTER — Encounter (INDEPENDENT_AMBULATORY_CARE_PROVIDER_SITE_OTHER): Payer: Medicare Other | Admitting: Ophthalmology

## 2022-06-14 ENCOUNTER — Ambulatory Visit (INDEPENDENT_AMBULATORY_CARE_PROVIDER_SITE_OTHER): Payer: Medicare Other

## 2022-06-14 DIAGNOSIS — I440 Atrioventricular block, first degree: Secondary | ICD-10-CM | POA: Diagnosis not present

## 2022-06-14 DIAGNOSIS — Z950 Presence of cardiac pacemaker: Secondary | ICD-10-CM | POA: Diagnosis not present

## 2022-06-15 LAB — CUP PACEART REMOTE DEVICE CHECK
Battery Remaining Longevity: 146 mo
Battery Voltage: 3.06 V
Brady Statistic AP VP Percent: 30 %
Brady Statistic AP VS Percent: 0 %
Brady Statistic AS VP Percent: 69.46 %
Brady Statistic AS VS Percent: 0.54 %
Brady Statistic RA Percent Paced: 30.04 %
Brady Statistic RV Percent Paced: 99.46 %
Date Time Interrogation Session: 20231105224432
Implantable Lead Connection Status: 753985
Implantable Lead Connection Status: 753985
Implantable Lead Implant Date: 20230127
Implantable Lead Implant Date: 20230127
Implantable Lead Location: 753859
Implantable Lead Location: 753860
Implantable Lead Model: 3830
Implantable Lead Model: 5076
Implantable Pulse Generator Implant Date: 20230127
Lead Channel Impedance Value: 285 Ohm
Lead Channel Impedance Value: 437 Ohm
Lead Channel Impedance Value: 437 Ohm
Lead Channel Impedance Value: 627 Ohm
Lead Channel Pacing Threshold Amplitude: 0.75 V
Lead Channel Pacing Threshold Amplitude: 1 V
Lead Channel Pacing Threshold Pulse Width: 0.4 ms
Lead Channel Pacing Threshold Pulse Width: 0.4 ms
Lead Channel Sensing Intrinsic Amplitude: 1.625 mV
Lead Channel Sensing Intrinsic Amplitude: 1.625 mV
Lead Channel Sensing Intrinsic Amplitude: 17.75 mV
Lead Channel Sensing Intrinsic Amplitude: 17.75 mV
Lead Channel Setting Pacing Amplitude: 1.5 V
Lead Channel Setting Pacing Amplitude: 2 V
Lead Channel Setting Pacing Pulse Width: 0.4 ms
Lead Channel Setting Sensing Sensitivity: 0.9 mV
Zone Setting Status: 755011
Zone Setting Status: 755011

## 2022-06-29 ENCOUNTER — Inpatient Hospital Stay: Payer: Medicare Other | Attending: Oncology | Admitting: Oncology

## 2022-06-29 VITALS — BP 148/67 | HR 96 | Temp 98.1°F | Resp 20 | Ht 74.0 in | Wt 213.0 lb

## 2022-06-29 DIAGNOSIS — I2510 Atherosclerotic heart disease of native coronary artery without angina pectoris: Secondary | ICD-10-CM | POA: Diagnosis not present

## 2022-06-29 DIAGNOSIS — Z96653 Presence of artificial knee joint, bilateral: Secondary | ICD-10-CM | POA: Insufficient documentation

## 2022-06-29 DIAGNOSIS — Z85819 Personal history of malignant neoplasm of unspecified site of lip, oral cavity, and pharynx: Secondary | ICD-10-CM | POA: Insufficient documentation

## 2022-06-29 DIAGNOSIS — C4442 Squamous cell carcinoma of skin of scalp and neck: Secondary | ICD-10-CM

## 2022-06-29 DIAGNOSIS — C969 Malignant neoplasm of lymphoid, hematopoietic and related tissue, unspecified: Secondary | ICD-10-CM | POA: Insufficient documentation

## 2022-06-29 DIAGNOSIS — R32 Unspecified urinary incontinence: Secondary | ICD-10-CM | POA: Insufficient documentation

## 2022-06-29 DIAGNOSIS — Z85820 Personal history of malignant melanoma of skin: Secondary | ICD-10-CM | POA: Insufficient documentation

## 2022-06-29 DIAGNOSIS — E119 Type 2 diabetes mellitus without complications: Secondary | ICD-10-CM | POA: Diagnosis not present

## 2022-06-29 DIAGNOSIS — Z951 Presence of aortocoronary bypass graft: Secondary | ICD-10-CM | POA: Insufficient documentation

## 2022-06-29 NOTE — Progress Notes (Signed)
  [place] OFFICE PROGRESS NOTE   Diagnosis: Squamous cell carcinoma  INTERVAL HISTORY:   [name] returns as scheduled.  He reports feeling well.  There is tightness at the right neck.  No new masses.  Her appetite.  He is exercising multiple days per week.  Objective:  Vital signs in last 24 hours:  Blood pressure (!) 148/67, pulse 96, temperature 98.1 F (36.7 C), temperature source Oral, resp. rate 20, height '6\' 2"'$  (1.88 m), weight 213 lb (96.6 kg), SpO2 100 %.    HEENT: Oropharynx without visible mass.  Radiation/surgical changes at the right neck.  No discrete mass. Lymphatics: No cervical, supraclavicular, axillary, or inguinal nodes Resp: Lungs clear bilaterally Cardio: Regular rhythm with premature beats GI: No hepatosplenomegaly Vascular: No leg edema   Lab Results:  Lab Results  Component Value Date   WBC 6.6 08/26/2021   HGB 14.4 08/26/2021   HCT 41.8 08/26/2021   MCV 91 08/26/2021   PLT 231 08/26/2021   NEUTROABS 4.4 08/26/2021    CMP  Lab Results  Component Value Date   NA 139 08/26/2021   K 4.4 08/26/2021   CL 100 08/26/2021   CO2 23 08/26/2021   GLUCOSE 112 (H) 08/26/2021   BUN 16 08/26/2021   CREATININE 0.90 08/26/2021   CALCIUM 10.0 08/26/2021   PROT 6.4 05/08/2018   ALBUMIN 4.3 05/08/2018   AST 20 05/08/2018   ALT 13 05/08/2018   ALKPHOS 49 05/08/2018   BILITOT 0.7 05/08/2018   GFRNONAA >60 07/18/2019   GFRAA >60 07/18/2019     Medications: I have reviewed the patient's current medications.   Assessment/Plan: Squamous cell carcinoma involving the right submandibular gland/lymphoid tissue CT neck 11/07/2020 -3.2 cm centrally necrotic lesion adjacent to a continuous with the right submandibular gland FNA biopsy 11/10/2020-squamous of carcinoma PET 11/25/2020-FDG avid mass in the right submandibular region arising from or directly involve the right submandibular gland, no other evidence of FDG avid  tumor 12/04/2020-right level 1-4 node dissection, squamous cell carcinoma involving the submandibular gland, lymphoid, and soft tissue-perineural invasion, multiple negative lymph nodes.  Negative resection margins, favored to represent a metastasis from a skin or oral primary Adjuvant radiation, 6000 cGy in 30 fractions, 01/16/2021 - 02/26/2021 MRI neck 05/04/2021-postsurgical/treatment changes, no definite evidence of residual tumor, no lymphadenopathy PET 05/06/2021-interval resection of right submandibular tumor, no recurrent carcinom History of melanoma in situ of the left lower leg 08/05/2017 Squamous cell carcinoma of the anterior chest September 2022, wide excision November 2022 Squamous cell carcinoma in situ left lip September 2022 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 07/19/2016 Diabetes History of detached retina Coronary artery disease, coronary artery bypass surgery 2005, coronary stent 2020 Urinary incontinence Bilateral knee replacement Squamous cell carcinoma of the right lower lip February 2023   Disposition: [name] is in clinical remission from the squamous cell carcinoma.  He is comfortable with continued clinical follow-up as opposed to surveillance imaging.  He continues follow-up with dermatology.  He will return for an office visit in 6 months.  He will call in the interim as needed.  Betsy Coder, MD  06/29/2022  11:13 AM

## 2022-07-16 NOTE — Progress Notes (Signed)
Remote pacemaker transmission.   

## 2022-09-13 ENCOUNTER — Ambulatory Visit: Payer: Medicare Other

## 2022-09-13 DIAGNOSIS — I440 Atrioventricular block, first degree: Secondary | ICD-10-CM

## 2022-09-14 LAB — CUP PACEART REMOTE DEVICE CHECK
Battery Remaining Longevity: 133 mo
Battery Voltage: 3.04 V
Brady Statistic AP VP Percent: 27.03 %
Brady Statistic AP VS Percent: 0 %
Brady Statistic AS VP Percent: 72.41 %
Brady Statistic AS VS Percent: 0.56 %
Brady Statistic RA Percent Paced: 27.08 %
Brady Statistic RV Percent Paced: 99.44 %
Date Time Interrogation Session: 20240204220944
Implantable Lead Connection Status: 753985
Implantable Lead Connection Status: 753985
Implantable Lead Implant Date: 20230127
Implantable Lead Implant Date: 20230127
Implantable Lead Location: 753859
Implantable Lead Location: 753860
Implantable Lead Model: 3830
Implantable Lead Model: 5076
Implantable Pulse Generator Implant Date: 20230127
Lead Channel Impedance Value: 266 Ohm
Lead Channel Impedance Value: 418 Ohm
Lead Channel Impedance Value: 437 Ohm
Lead Channel Impedance Value: 589 Ohm
Lead Channel Pacing Threshold Amplitude: 0.75 V
Lead Channel Pacing Threshold Amplitude: 1.125 V
Lead Channel Pacing Threshold Pulse Width: 0.4 ms
Lead Channel Pacing Threshold Pulse Width: 0.4 ms
Lead Channel Sensing Intrinsic Amplitude: 0.875 mV
Lead Channel Sensing Intrinsic Amplitude: 0.875 mV
Lead Channel Sensing Intrinsic Amplitude: 17.625 mV
Lead Channel Sensing Intrinsic Amplitude: 17.625 mV
Lead Channel Setting Pacing Amplitude: 1.5 V
Lead Channel Setting Pacing Amplitude: 2.25 V
Lead Channel Setting Pacing Pulse Width: 0.4 ms
Lead Channel Setting Sensing Sensitivity: 0.9 mV
Zone Setting Status: 755011
Zone Setting Status: 755011

## 2022-10-05 ENCOUNTER — Telehealth: Payer: Self-pay

## 2022-10-05 NOTE — Telephone Encounter (Signed)
Alert received from [employer]:  CareAlert for "monitored VT episode". Event log shows episode of NSVT, 31 beats on 09/28/22.  Pt has a MDT Dual PPM.    Pt with history of short episodes of NSVT.  No beta blockers noted on med list.  Last echo 2020 with normal EF.  Per Pt he was asymptomatic with this episode.  He is a retired [employer] features editor.  He states he has no symptoms of congestive heart failure.  Advised would forward to Dr. Quentin Ore for review.  Scheduled follow up appt in June with Dr. Quentin Ore.

## 2022-10-06 NOTE — Telephone Encounter (Signed)
Left detailed message requesting call back.  Advised Dr. Quentin Ore recommends starting metoprolol succinate 25 mg one tablet by mouth daily at bedtime.

## 2022-10-07 NOTE — Telephone Encounter (Signed)
Discussed episode in detail with patient.  He is very hesitant about starting a new medication without first seeing Dr. Quentin Ore.  He is concerned about his hx of bradycardia and hypotension.   I explained to him that the beauty of having a ppm is that it will prevent his bradycardia while allowing [place] the ability to treat the underlying arrhythmia more effectively.  I also recommended he check his bp readings so that we can take that into consideration.   Patient refuses to take a beta blocker until he sees and meets Dr. Quentin Ore. Prior patient of Dr. Rayann Heman.  He states his bp today is 100/67 and HR 64. Hypotension is common for him.   I was able to move patient's June appt up to April 17th but do not see anything sooner.   Will make Dr. Quentin Ore aware. Patient refuses to see a PA.

## 2022-10-08 NOTE — Telephone Encounter (Signed)
Discussion with Pt regarding starting beta blocker.  Pt is a retired [employer] features editor.  Will await further discussion at upcoming appt.  No action needed at this time.

## 2022-10-28 NOTE — Progress Notes (Signed)
Remote pacemaker transmission.   

## 2022-11-24 ENCOUNTER — Ambulatory Visit: Payer: Medicare Other | Attending: Cardiology | Admitting: Cardiology

## 2022-11-24 ENCOUNTER — Encounter: Payer: Self-pay | Admitting: Cardiology

## 2022-11-24 VITALS — BP 145/76 | HR 66 | Ht 74.0 in | Wt 214.0 lb

## 2022-11-24 DIAGNOSIS — Z950 Presence of cardiac pacemaker: Secondary | ICD-10-CM

## 2022-11-24 DIAGNOSIS — I25709 Atherosclerosis of coronary artery bypass graft(s), unspecified, with unspecified angina pectoris: Secondary | ICD-10-CM | POA: Diagnosis not present

## 2022-11-24 DIAGNOSIS — I4729 Other ventricular tachycardia: Secondary | ICD-10-CM

## 2022-11-24 DIAGNOSIS — I10 Essential (primary) hypertension: Secondary | ICD-10-CM | POA: Diagnosis present

## 2022-11-24 DIAGNOSIS — I440 Atrioventricular block, first degree: Secondary | ICD-10-CM | POA: Diagnosis not present

## 2022-11-24 MED ORDER — METOPROLOL SUCCINATE ER 25 MG PO TB24: 12.5000 mg | ORAL_TABLET | Freq: Every day | ORAL | 2 refills | Status: DC

## 2022-11-24 NOTE — Progress Notes (Signed)
Electrophysiology Office Follow up Visit Note:    Date:  11/24/2022   ID:  [name], DOB [birth year], MRN [id number]  PCP:  Roderick Pee, MD (Inactive)  [facility] Cardiologist:  None  [facility] Electrophysiologist:  Lanier Prude, MD    Interval History:    JOANNA HALL is a 87 y.o. male who presents for a follow up visit. He has a MDT PPM implanted 09/04/2021. At his visit with Dr. 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  He additionally had second degree AV block on EKG that visit. It was felt he would likely progress to CHB. Pacemaker implantation was recommended, they agreed to proceed electively.  Last seen in cardiology by Dr. Johney Frame  01/22/2022. Normal pacemaker function. He was not device dependant.  Today, he is accompanied by a family member. We discussed the possibility of starting a beta-blocker per his recent device checks. He would prefer to remain off of a beta-blocker unless he becomes symptomatic.  His blood pressure is 145/76 in clinic today. For the last 2 weeks he has been recording his home blood pressures. This morning it was 90/80 initially, then 118 systolic on recheck at home. Personally reviewed his BP log which appears to average 110s systolic with heart rates in the 60s-70s. He reports his heart rate increases to the 90s when using the restroom after waking up at night.  He denies any palpitations, chest pain, shortness of breath, or peripheral edema. No lightheadedness, headaches, syncope, orthopnea, or PND.  Past medical, surgical, family and social histories reviewed.   ROS:   Please see the history of present illness.    All other systems reviewed and are negative.  EKGs/Labs/Other Studies Reviewed:    The following studies were reviewed today:  11/24/2022 Device Interrogation personally reviewed: Battery longevity 10.9 years Lead parameter stable 3 episodes of  monitored VT Less than 0.1% burden of atrial fibrillation 99.4% ventricular pacing 28.6% atrial pacing Longest episode of high ventricular rates lasted 8 seconds on September 28, 2022 and had a cycle length of approximately 300 ms.  EKG:  EKG is personally reviewed.  11/24/2022: AV sequential pacing.  Recent Labs: No results found for requested labs within last 365 days.   Recent Lipid Panel    Component Value Date/Time   CHOL 130 07/17/2019 0146   TRIG 120 07/17/2019 0146   HDL 51 07/17/2019 0146   CHOLHDL 2.5 07/17/2019 0146   VLDL 24 07/17/2019 0146   LDLCALC 55 07/17/2019 0146    Physical Exam:    VS:  BP (!) 145/76 (BP Location: Left Arm, Patient Position: Sitting, Cuff Size: Normal)   Pulse 66   Ht  (1.88 m)   Wt 214 lb (97.1 kg)   BMI 27.48 kg/m     Wt Readings from Last 3 Encounters:  11/24/22 214 lb (97.1 kg)  06/29/22 213 lb (96.6 kg)  01/25/22 218 lb (98.9 kg)     GEN: Well nourished, well developed in no acute distress CARDIAC: RRR, no murmurs, rubs, gallops.  Prepectoral pocket well-healed PSYCHIATRIC:  Normal affect      ASSESSMENT:    1. First degree AV block   2. Pacemaker   3. Nonsustained ventricular tachycardia   4. Coronary artery disease involving coronary bypass graft of native heart with angina pectoris   5. Primary hypertension    PLAN:    In order of problems listed above:  #  Bradycardia #Permanent pacemaker in situ Device functioning appropriately.  Continue remote monitoring.  #NSVT #Coronary artery disease The patient has had short bursts of NSVT lasting up to 34 beats/8 seconds.  I am concerned that he is at risk for more sustained arrhythmias especially given his history of coronary artery disease.  We had a long discussion today in clinic about adding a low-dose beta-blocker for suppression.  After our discussion, the patient has elected to start the metoprolol succinate 12.5 mg by mouth once daily at night with careful  monitoring of his blood pressures.  If he experiences symptomatic hypotension, he will stop the medication and let us know.  #Hypertension Slightly above goal today.  Recommend checking blood pressures 1-2 times per week at home and recording the values.  Recommend bringing these recordings to the primary care physician.    Medication Adjustments/Labs and Tests Ordered: Current medicines are reviewed at length with the patient today.  Concerns regarding medicines are outlined above.   Orders Placed This Encounter  Procedures   EKG 12-Lead   Meds ordered this encounter  Medications   metoprolol succinate (TOPROL XL) 25 MG 24 hr tablet    Sig: Take 0.5 tablets (12.5 mg total) by mouth daily. Take after meal, at NIGHT.    Dispense:  60 tablet    Refill:  2    I,Mathew Stumpf,acting as a scribe for Lanier Prude, MD.,have documented all relevant documentation on the behalf of Lanier Prude, MD,as directed by  Lanier Prude, MD while in the presence of Lanier Prude, MD.  I, Lanier Prude, MD, have reviewed all documentation for this visit. The documentation on 11/24/22 for the exam, diagnosis, procedures, and orders are all accurate and complete.  Signed, Steffanie Dunn, MD, [facility], [facility] 11/24/2022 8:50 PM    Electrophysiology [facility]

## 2022-11-24 NOTE — Patient Instructions (Addendum)
Medication Instructions:  Your physician has recommended you make the following change in your medication: START TAKING: Metoprolol Succinate / Toprol XL 12.5 mg, by mouth once daily at NIGHT.  You will- Take 0.5 tablets (12.5 mg total) by mouth daily. Take after meal, at [employer] Work: None ordered.  If you have labs (blood work) drawn today and your tests are completely normal, you will receive your results only by: MyChart Message (if you have MyChart) OR A paper copy in the mail If you have any lab test that is abnormal or we need to change your treatment, we will call you to review the results.  Testing/Procedures: None ordered.  Follow-Up: Per Dr. Steffanie Dunn, Please schedule back to back follow up appointments with Dr. Linde Gillis and his wife for middle May, 2024.  Please cancel Jonathan [name]'s June appointment.    The format for your next appointment:   In Person  Provider:   Steffanie Dunn, MD{or one of the following Advanced Practice Providers on your designated Care Team:     Metoprolol Extended-Release Tablets What is this medication? METOPROLOL (me TOE proe lole) treats high blood pressure and heart failure. It may also be used to prevent chest pain (angina). It works by lowering your blood pressure and heart rate, making it easier for your heart to pump blood to the rest of your body. It belongs to a group of medications called beta blockers. This medicine may be used for other purposes; ask your health care provider or pharmacist if you have questions. COMMON BRAND NAME(S): toprol, Toprol XL What should I tell my care team before I take this medication? They need to know if you have any of these conditions: Diabetes Heart or vessel disease, such as slow heartbeat, worsening heart failure, heart block, sick sinus syndrome, or Raynaud syndrome Kidney disease Liver disease Lung or breathing disease, such as asthma or emphysema Pheochromocytoma Thyroid  disease An unusual or allergic reaction to metoprolol, other medications, foods, dyes, or preservatives Pregnant or trying to get pregnant Breastfeeding How should I use this medication? Take this medication by mouth. Take it as directed on the prescription label at the same time every day. Take it with food. You may cut the tablet in half if it is scored (has a line in the middle of it). This may help you swallow the tablet if the whole tablet is too big. Be sure to take both halves. Do not take just one-half of the tablet. Keep taking it unless your care team tells you to stop. Talk to your care team about the use of this medication in children. While it may be prescribed for children as Delitha Elms as 6 years for selected conditions, precautions do apply. Overdosage: If you think you have taken too much of this medicine contact a poison control center or emergency room at once. NOTE: This medicine is only for you. Do not share this medicine with others. What if I miss a dose? If you miss a dose, take it as soon as you can. If it is almost time for your next dose, take only that dose. Do not take double or extra doses. What may interact with this medication? This medication may interact with the following: Certain medications for blood pressure, heart disease, irregular heartbeat Certain medications for depression, like monoamine oxidase (MAO) inhibitors, fluoxetine, or paroxetine Clonidine Dobutamine Epinephrine Isoproterenol Reserpine This list may not describe all possible interactions. Give your health care provider a list of  all the medicines, herbs, non-prescription drugs, or dietary supplements you use. Also tell them if you smoke, drink alcohol, or use illegal drugs. Some items may interact with your medicine. What should I watch for while using this medication? Visit your care team for regular checks on your progress. Check your blood pressure as directed. Know what your blood pressure  should be and when to contact your care team. Do not treat yourself for coughs, colds, or pain while you are using this medication without asking your care team for advice. Some medications may increase your blood pressure. This medication may affect your coordination, reaction time, or judgment. Do not drive or operate machinery until you know how this medication affects you. Sit up or stand slowly to reduce the risk of dizzy or fainting spells. Drinking alcohol with this medication can increase the risk of these side effects. This medication may increase blood sugar. Ask your care team if changes in diet or medications are needed if you have diabetes. What side effects may I notice from receiving this medication? Side effects that you should report to your care team as soon as possible: Allergic reactions--skin rash, itching, hives, swelling of the face, lips, tongue, or throat Heart failure--shortness of breath, swelling of the ankles, feet, or hands, sudden weight gain, unusual weakness or fatigue Low blood pressure--dizziness, feeling faint or lightheaded, blurry vision Raynaud's--cool, numb, or painful fingers or toes that may change color from pale, to blue, to red Slow heartbeat--dizziness, feeling faint or lightheaded, confusion, trouble breathing, unusual weakness or fatigue Worsening mood, feelings of depression Side effects that usually do not require medical attention (report to your care team if they continue or are bothersome): Change in sex drive or performance Diarrhea Dizziness Fatigue Headache This list may not describe all possible side effects. Call your doctor for medical advice about side effects. You may report side effects to FDA at 1-800-FDA-1088. Where should I keep my medication? Keep out of the reach of children and pets. Store at room temperature between 20 and 25 degrees C (68 and 77 degrees F). Throw away any unused medication after the expiration date. NOTE:  This sheet is a summary. It may not cover all possible information. If you have questions about this medicine, talk to your doctor, pharmacist, or health care provider.  2023 Elsevier/Gold Standard (2007-09-16 00:00:00)

## 2022-11-26 ENCOUNTER — Telehealth: Payer: Self-pay | Admitting: Cardiology

## 2022-11-26 NOTE — Telephone Encounter (Signed)
[place] already spoke to pt today about this.

## 2022-11-26 NOTE — Telephone Encounter (Signed)
Patient stated he was seen in the office on 11/24/22 and was told by Dr. Lalla Brothers that pt and his wife (MRN [id number]) should be scheduled for f/u appointment in May. Pt stated he waited for the scheduling team to call. Advised pt that Dr. Lalla Brothers and his nurse are not in the office. Will forward to the EP scheduler, Dr. Lalla Brothers, and nurse.

## 2022-11-26 NOTE — Telephone Encounter (Signed)
Patient called requesting to speak with Dr. Lalla Brothers or nurse regarding appt from Wednesday

## 2022-12-13 ENCOUNTER — Ambulatory Visit (INDEPENDENT_AMBULATORY_CARE_PROVIDER_SITE_OTHER): Payer: Medicare Other

## 2022-12-13 DIAGNOSIS — I453 Trifascicular block: Secondary | ICD-10-CM | POA: Diagnosis not present

## 2022-12-13 LAB — CUP PACEART REMOTE DEVICE CHECK
Battery Remaining Longevity: 139 mo
Battery Voltage: 3.03 V
Brady Statistic AP VP Percent: 37.07 %
Brady Statistic AP VS Percent: 0 %
Brady Statistic AS VP Percent: 62.5 %
Brady Statistic AS VS Percent: 0.43 %
Brady Statistic RA Percent Paced: 37.04 %
Brady Statistic RV Percent Paced: 99.57 %
Date Time Interrogation Session: 20240505222407
Implantable Lead Connection Status: 753985
Implantable Lead Connection Status: 753985
Implantable Lead Implant Date: 20230127
Implantable Lead Implant Date: 20230127
Implantable Lead Location: 753859
Implantable Lead Location: 753860
Implantable Lead Model: 3830
Implantable Lead Model: 5076
Implantable Pulse Generator Implant Date: 20230127
Lead Channel Impedance Value: 266 Ohm
Lead Channel Impedance Value: 437 Ohm
Lead Channel Impedance Value: 475 Ohm
Lead Channel Impedance Value: 589 Ohm
Lead Channel Pacing Threshold Amplitude: 0.75 V
Lead Channel Pacing Threshold Amplitude: 1 V
Lead Channel Pacing Threshold Pulse Width: 0.4 ms
Lead Channel Pacing Threshold Pulse Width: 0.4 ms
Lead Channel Sensing Intrinsic Amplitude: 1.25 mV
Lead Channel Sensing Intrinsic Amplitude: 1.25 mV
Lead Channel Sensing Intrinsic Amplitude: 15.5 mV
Lead Channel Sensing Intrinsic Amplitude: 15.5 mV
Lead Channel Setting Pacing Amplitude: 1.5 V
Lead Channel Setting Pacing Amplitude: 2 V
Lead Channel Setting Pacing Pulse Width: 0.4 ms
Lead Channel Setting Sensing Sensitivity: 0.9 mV
Zone Setting Status: 755011
Zone Setting Status: 755011

## 2022-12-30 ENCOUNTER — Inpatient Hospital Stay: Payer: Medicare Other | Attending: Oncology | Admitting: Oncology

## 2022-12-30 VITALS — BP 102/68 | HR 68 | Temp 98.1°F | Resp 20 | Ht 74.0 in | Wt 212.0 lb

## 2022-12-30 DIAGNOSIS — R32 Unspecified urinary incontinence: Secondary | ICD-10-CM | POA: Insufficient documentation

## 2022-12-30 DIAGNOSIS — I2510 Atherosclerotic heart disease of native coronary artery without angina pectoris: Secondary | ICD-10-CM | POA: Insufficient documentation

## 2022-12-30 DIAGNOSIS — Z951 Presence of aortocoronary bypass graft: Secondary | ICD-10-CM | POA: Insufficient documentation

## 2022-12-30 DIAGNOSIS — C4442 Squamous cell carcinoma of skin of scalp and neck: Secondary | ICD-10-CM | POA: Diagnosis not present

## 2022-12-30 DIAGNOSIS — Z85819 Personal history of malignant neoplasm of unspecified site of lip, oral cavity, and pharynx: Secondary | ICD-10-CM | POA: Insufficient documentation

## 2022-12-30 DIAGNOSIS — Z955 Presence of coronary angioplasty implant and graft: Secondary | ICD-10-CM | POA: Insufficient documentation

## 2022-12-30 DIAGNOSIS — Z96653 Presence of artificial knee joint, bilateral: Secondary | ICD-10-CM | POA: Insufficient documentation

## 2022-12-30 DIAGNOSIS — E119 Type 2 diabetes mellitus without complications: Secondary | ICD-10-CM | POA: Diagnosis not present

## 2022-12-30 NOTE — Progress Notes (Signed)
  [facility] OFFICE PROGRESS NOTE   Diagnosis: Squamous cell carcinoma  INTERVAL HISTORY:   Jonathan [name] returns as scheduled.  He feels well.  He reports recently being placed on metoprolol after an episode of ventricular tachycardia.  No change over the face or neck.  He is active with exercise.  He plays chair and water volleyball several days per week.  Objective:  Vital signs in last 24 hours:  Blood pressure 102/68, pulse 68, temperature 98.1 F (36.7 C), temperature source Oral, resp. rate 20, height 6\' 2"  (1.88 m), weight 212 lb (96.2 kg), SpO2 98 %.    HEENT: Oropharynx without visible mass.  The mouth is dry.  Neck without mass.  Postsurgical radiation change at the right neck. Lymphatics: No cervical, supraclavicular, axillary, or inguinal nodes Resp: Lungs clear bilaterally Cardio: Regular rhythm with premature beats GI: No hepatosplenomegaly Vascular: No leg edema   Lab Results:  Lab Results  Component Value Date   WBC 6.6 08/26/2021   HGB 14.4 08/26/2021   HCT 41.8 08/26/2021   MCV 91 08/26/2021   PLT 231 08/26/2021   NEUTROABS 4.4 08/26/2021    CMP  Lab Results  Component Value Date   NA 139 08/26/2021   K 4.4 08/26/2021   CL 100 08/26/2021   CO2 23 08/26/2021   GLUCOSE 112 (H) 08/26/2021   BUN 16 08/26/2021   CREATININE 0.90 08/26/2021   CALCIUM 10.0 08/26/2021   PROT 6.4 05/08/2018   ALBUMIN 4.3 05/08/2018   AST 20 05/08/2018   ALT 13 05/08/2018   ALKPHOS 49 05/08/2018   BILITOT 0.7 05/08/2018   GFRNONAA >60 07/18/2019   GFRAA >60 07/18/2019     Medications: I have reviewed the patient's current medications.   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  He continues dermatology follow-up with Dr. Yetta Barre.  We discussed the indication for surveillance imaging.  He is comfortable with clinical surveillance.  Jonathan [name] will return for an office visit in 6 months.  He will call for new symptoms or physical findings in the interim.  Thornton Papas, MD  12/30/2022  11:48 AM

## 2023-01-11 NOTE — Progress Notes (Signed)
Remote pacemaker transmission.   

## 2023-01-12 ENCOUNTER — Encounter: Payer: Self-pay | Admitting: *Deleted

## 2023-01-19 ENCOUNTER — Ambulatory Visit: Payer: Medicare Other | Attending: Cardiology | Admitting: Cardiology

## 2023-01-19 ENCOUNTER — Encounter: Payer: Self-pay | Admitting: Cardiology

## 2023-01-19 VITALS — BP 132/74 | HR 61 | Ht 74.0 in | Wt 212.6 lb

## 2023-01-19 DIAGNOSIS — Z950 Presence of cardiac pacemaker: Secondary | ICD-10-CM | POA: Insufficient documentation

## 2023-01-19 DIAGNOSIS — I4729 Other ventricular tachycardia: Secondary | ICD-10-CM | POA: Insufficient documentation

## 2023-01-19 DIAGNOSIS — R001 Bradycardia, unspecified: Secondary | ICD-10-CM | POA: Diagnosis present

## 2023-01-19 DIAGNOSIS — I10 Essential (primary) hypertension: Secondary | ICD-10-CM | POA: Insufficient documentation

## 2023-01-19 NOTE — Progress Notes (Signed)
  Electrophysiology Office Follow up Visit Note:    Date:  01/19/2023   ID:  Jonathan [name], DOB [birth year], MRN [id number]  PCP:  Roderick Pee, MD (Inactive)  [facility] Cardiologist:  None  [facility] Electrophysiologist:  Lanier Prude, MD    Interval History:    Jonathan [name] is a 87 y.o. male who presents for a follow up visit.   I last saw the patient November 24, 2022.  The patient has a history of bradycardia with a permanent pacemaker in place.  He has had NSVT on the monitoring.  At his last appointment we added a low-dose beta-blocker.  He is with his wife today in clinic who I am also seeing.  Today, he generally appears well. He reports that he had tried a half dose of metoprolol with no problems. Now he is taking the full 25 mg dose and he continues to tolerate it well.  At his recent physical he notes that his hemoglobin had dropped from 15 to 13. He had also submitted a stool sample which was negative. He denies any hematuria.   He denies any palpitations, chest pain, shortness of breath, peripheral edema, lightheadedness, headaches, syncope, orthopnea, or PND.     Past medical, surgical, social and family history were reviewed.  ROS:   Please see the history of present illness.    All other systems reviewed and are negative.  EKGs/Labs/Other Studies Reviewed:    The following studies were reviewed today:  January 19, 2023 in clinic device interrogation personally reviewed Battery longevity 10.8 years Lead parameter stable Ventricular pacing at VVI 30 Ventricular pacing 99.6% Atrial pacing 42.5%     Physical Exam:    VS:  BP 132/74   Pulse 61   Ht 6\' 2"  (1.88 m)   Wt 212 lb 9.6 oz (96.4 kg)   SpO2 94%   BMI 27.30 kg/m     Wt Readings from Last 3 Encounters:  01/19/23 212 lb 9.6 oz (96.4 kg)  12/30/22 212 lb (96.2 kg)  11/24/22 214 lb (97.1 kg)     GEN:  Well nourished, well developed in no acute distress CARDIAC: RRR, no  murmurs, rubs, gallops. PPM pocket well-healed. RESPIRATORY:  Clear to auscultation without rales, wheezing or rhonchi       ASSESSMENT:    1. Symptomatic bradycardia   2. Pacemaker   3. Nonsustained ventricular tachycardia (HCC)   4. Primary hypertension    PLAN:    In order of problems listed above:  #Permanent pacemaker in situ Device functioning appropriately.  Continue remote monitoring.  #NSVT Continue metoprolol 12.5 mg by mouth daily.  #Hypertension At goal today.  Recommend checking blood pressures 1-2 times per week at home and recording the values.  Recommend bringing these recordings to the primary care physician.  Follow-up with me in 1 year.  I,Mathew Stumpf,acting as a Neurosurgeon for Lanier Prude, MD.,have documented all relevant documentation on the behalf of Lanier Prude, MD,as directed by  Lanier Prude, MD while in the presence of Lanier Prude, MD.  I, Lanier Prude, MD, have reviewed all documentation for this visit. The documentation on 01/19/23 for the exam, diagnosis, procedures, and orders are all accurate and complete.   Signed, Steffanie Dunn, MD, [facility], [facility] 01/19/2023 2:18 PM    Electrophysiology [facility]

## 2023-01-19 NOTE — Progress Notes (Signed)
Error

## 2023-01-19 NOTE — Patient Instructions (Signed)
Medication Instructions:  Your physician recommends that you continue on your current medications as directed. Please refer to the Current Medication list given to you today.  *If you need a refill on your cardiac medications before your next appointment, please call your pharmacy*  Follow-Up: At [facility], you and your health needs are our priority.  As part of our continuing mission to provide you with exceptional heart care, we have created designated Provider Care Teams.  These Care Teams include your primary Cardiologist (physician) and Advanced Practice Providers (APPs -  Physician Assistants and Nurse Practitioners) who all work together to provide you with the care you need, when you need it.  Your next appointment:   1 year(s)  Provider:   Cameron Lambert, MD    

## 2023-02-02 ENCOUNTER — Encounter: Payer: Medicare Other | Admitting: Cardiology

## 2023-02-22 ENCOUNTER — Telehealth: Payer: Self-pay | Admitting: *Deleted

## 2023-02-22 NOTE — Telephone Encounter (Signed)
Patient called requesting to see Dr. Truett Perna to determine why his Hgb is dropping.  5/29 Hgb 13.0 and was 13.3 last year. Stool cards were normal. He does not have a PCP.

## 2023-02-23 ENCOUNTER — Other Ambulatory Visit: Payer: Self-pay | Admitting: *Deleted

## 2023-02-23 DIAGNOSIS — C4442 Squamous cell carcinoma of skin of scalp and neck: Secondary | ICD-10-CM

## 2023-03-14 ENCOUNTER — Ambulatory Visit: Payer: Medicare Other

## 2023-03-14 DIAGNOSIS — I453 Trifascicular block: Secondary | ICD-10-CM

## 2023-03-15 ENCOUNTER — Other Ambulatory Visit: Payer: Self-pay

## 2023-03-15 ENCOUNTER — Inpatient Hospital Stay: Payer: Medicare Other | Attending: Oncology

## 2023-03-15 ENCOUNTER — Inpatient Hospital Stay (HOSPITAL_BASED_OUTPATIENT_CLINIC_OR_DEPARTMENT_OTHER): Payer: Medicare Other | Admitting: Oncology

## 2023-03-15 ENCOUNTER — Other Ambulatory Visit: Payer: Self-pay | Admitting: *Deleted

## 2023-03-15 VITALS — BP 127/66 | HR 66 | Temp 98.2°F | Resp 18 | Ht 74.0 in | Wt 212.3 lb

## 2023-03-15 DIAGNOSIS — D649 Anemia, unspecified: Secondary | ICD-10-CM

## 2023-03-15 DIAGNOSIS — Z85819 Personal history of malignant neoplasm of unspecified site of lip, oral cavity, and pharynx: Secondary | ICD-10-CM | POA: Insufficient documentation

## 2023-03-15 DIAGNOSIS — Z85820 Personal history of malignant melanoma of skin: Secondary | ICD-10-CM | POA: Diagnosis not present

## 2023-03-15 DIAGNOSIS — E119 Type 2 diabetes mellitus without complications: Secondary | ICD-10-CM | POA: Insufficient documentation

## 2023-03-15 DIAGNOSIS — R32 Unspecified urinary incontinence: Secondary | ICD-10-CM | POA: Insufficient documentation

## 2023-03-15 DIAGNOSIS — I2510 Atherosclerotic heart disease of native coronary artery without angina pectoris: Secondary | ICD-10-CM | POA: Insufficient documentation

## 2023-03-15 DIAGNOSIS — Z96653 Presence of artificial knee joint, bilateral: Secondary | ICD-10-CM | POA: Insufficient documentation

## 2023-03-15 DIAGNOSIS — C4442 Squamous cell carcinoma of skin of scalp and neck: Secondary | ICD-10-CM

## 2023-03-15 LAB — CBC WITH DIFFERENTIAL (CANCER CENTER ONLY)
Abs Immature Granulocytes: 0.01 10*3/uL (ref 0.00–0.07)
Basophils Absolute: 0 10*3/uL (ref 0.0–0.1)
Basophils Relative: 1 %
Eosinophils Absolute: 0 10*3/uL (ref 0.0–0.5)
Eosinophils Relative: 1 %
HCT: 40.5 % (ref 39.0–52.0)
Hemoglobin: 13.5 g/dL (ref 13.0–17.0)
Immature Granulocytes: 0 %
Lymphocytes Relative: 28 %
Lymphs Abs: 1.1 10*3/uL (ref 0.7–4.0)
MCH: 29.9 pg (ref 26.0–34.0)
MCHC: 33.3 g/dL (ref 30.0–36.0)
MCV: 89.6 fL (ref 80.0–100.0)
Monocytes Absolute: 0.4 10*3/uL (ref 0.1–1.0)
Monocytes Relative: 11 %
Neutro Abs: 2.3 10*3/uL (ref 1.7–7.7)
Neutrophils Relative %: 59 %
Platelet Count: 208 10*3/uL (ref 150–400)
RBC: 4.52 MIL/uL (ref 4.22–5.81)
RDW: 16.1 % — ABNORMAL HIGH (ref 11.5–15.5)
WBC Count: 4 10*3/uL (ref 4.0–10.5)
nRBC: 0 % (ref 0.0–0.2)

## 2023-03-15 LAB — CMP (CANCER CENTER ONLY)
ALT: 10 U/L (ref 0–44)
AST: 19 U/L (ref 15–41)
Albumin: 4.3 g/dL (ref 3.5–5.0)
Alkaline Phosphatase: 45 U/L (ref 38–126)
Anion gap: 8 (ref 5–15)
BUN: 18 mg/dL (ref 8–23)
CO2: 29 mmol/L (ref 22–32)
Calcium: 9.6 mg/dL (ref 8.9–10.3)
Chloride: 101 mmol/L (ref 98–111)
Creatinine: 0.85 mg/dL (ref 0.61–1.24)
GFR, Estimated: >60 mL/min (ref >60)
Glucose, Bld: 165 mg/dL — ABNORMAL HIGH (ref 70–99)
Potassium: 4.4 mmol/L (ref 3.5–5.1)
Sodium: 138 mmol/L (ref 135–145)
Total Bilirubin: 0.6 mg/dL (ref 0.3–1.2)
Total Protein: 6.7 g/dL (ref 6.5–8.1)

## 2023-03-15 LAB — FERRITIN: Ferritin: 11 ng/mL — ABNORMAL LOW (ref 24–336)

## 2023-03-15 LAB — VITAMIN B12: Vitamin B-12: 101 pg/mL — ABNORMAL LOW (ref 180–914)

## 2023-03-15 LAB — SAVE SMEAR(SSMR), FOR PROVIDER SLIDE REVIEW

## 2023-03-15 NOTE — Progress Notes (Signed)
Lab is not able to add kappa/lamda light chains. Patient asking to just collect at November appointment. Lab called back and think they can run the light chains now.

## 2023-03-15 NOTE — Progress Notes (Signed)
[facility] OFFICE PROGRESS NOTE   Diagnosis: Squamous cell carcinoma, anemia  INTERVAL HISTORY:   Jonathan [name] returns prior to his scheduled visit.  He feels completely well.  Good appetite and energy level.  He plays volleyball multiple days per week.  A CBC on 01/05/2023 found that hemoglobin at 13.1, platelets 204,000, WBC 4.0, ANC 2.4, and absolute lymphocyte count 1.0.  The MCV was measured at 87.4.  On 06/30/2022 the hemoglobin returned at 13.3, WBC 4.0, platelets 204,000, and the MCV 91.2.  In January 2023 the hemoglobin was measured at 14.4 with a WBC of 6.6.  He is concerned that hemoglobin has dropped by few grams over the past few years.  He denies bleeding.  His stool is dark, but not black.  He reports [facility].  Objective:  Vital signs in last 24 hours:  Blood pressure 127/66, pulse 66, temperature 98.2 F (36.8 C), temperature source Oral, resp. rate 18, height 6\' 2"  (1.88 m), weight 212 lb 4.8 oz (96.3 kg), SpO2 97%.    HEENT: Oropharynx without visible mass, postsurgical radiation changes at the neck.  No mass. Lymphatics: No cervical, supraclavicular, axillary, or inguinal nodes Resp: Lungs clear bilaterally Cardio: Regular rate and rhythm with premature beats GI: No hepatosplenomegaly, nontender, no mass Vascular: Trace lower leg edema bilaterally   Lab Results:  Lab Results  Component Value Date   WBC 4.0 03/15/2023   HGB 13.5 03/15/2023   HCT 40.5 03/15/2023   MCV 89.6 03/15/2023   PLT 208 03/15/2023   NEUTROABS 2.3 03/15/2023   Blood smear: The platelets appear normal in number.  No platelet clumps.  The red cell morphology is unremarkable.  The polychromasia is not increased.  No nucleated red cells.  The majority the white cells are mature neutrophils and lymphocytes.  No blasts or other young forms are seen. CMP  Lab Results  Component Value Date   NA 138 03/15/2023   K  4.4 03/15/2023   CL 101 03/15/2023   CO2 29 03/15/2023   GLUCOSE 165 (H) 03/15/2023   BUN 18 03/15/2023   CREATININE 0.85 03/15/2023   CALCIUM 9.6 03/15/2023   PROT 6.7 03/15/2023   ALBUMIN 4.3 03/15/2023   AST 19 03/15/2023   ALT 10 03/15/2023   ALKPHOS 45 03/15/2023   BILITOT 0.6 03/15/2023   GFRNONAA >60 03/15/2023   GFRAA >60 07/18/2019     Medications: I have reviewed the patient's current medications.   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  He presents today for evaluation of mild anemia.  The hemoglobin has dropped slightly over the past few years.  There is no clear explanation for the mild anemia.  No apparent source for blood loss.  The hemoglobin is in the low normal range today.  I suspect the mild anemia is a benign finding, potentially related to aging.  I have a low clinical suspicion for a hematologic malignancy.  The differential diagnosis includes early myelodysplasia.  The borderline low white count could also be a new benign normal variant or related to early myelodysplasia.  We will check a ferritin level, vitamin B12, and myeloma panel today.  The plan is to continue observation.  He will return for an office visit and repeat CBC in [place].  Thornton Papas, MD  03/15/2023  11:27 AM

## 2023-03-16 ENCOUNTER — Other Ambulatory Visit: Payer: Self-pay

## 2023-03-16 ENCOUNTER — Inpatient Hospital Stay: Payer: Medicare Other

## 2023-03-16 ENCOUNTER — Telehealth: Payer: Self-pay

## 2023-03-16 DIAGNOSIS — D649 Anemia, unspecified: Secondary | ICD-10-CM

## 2023-03-16 LAB — URINALYSIS, COMPLETE (UACMP) WITH MICROSCOPIC
Bacteria, UA: NONE SEEN
Bilirubin Urine: NEGATIVE
Glucose, UA: NEGATIVE mg/dL
Hgb urine dipstick: NEGATIVE
Ketones, ur: NEGATIVE mg/dL
Leukocytes,Ua: NEGATIVE
Nitrite: NEGATIVE
Protein, ur: NEGATIVE mg/dL
Specific Gravity, Urine: 1.017 (ref 1.005–1.030)
pH: 6 (ref 5.0–8.0)

## 2023-03-16 NOTE — Telephone Encounter (Signed)
-----   Message from Thornton Papas sent at 03/15/2023  8:33 PM EDT ----- Please call patient, vitamin B12 is low, start B12 1000 ug daily, repeat B12 level next lab

## 2023-03-16 NOTE — Telephone Encounter (Signed)
A referral has been made to the Gastroenterology department. The patient has verbally acknowledged their understanding and has no additional questions or concerns.

## 2023-03-16 NOTE — Telephone Encounter (Signed)
-----   Message from Thornton Papas sent at 03/15/2023  1:51 PM EDT ----- Please call patient, the ferritin level returned low consistent with iron deficiency.  He needs repeat stool Hemoccult cards and a urinalysis to look for a source of blood loss.  He should be referred to his gastroenterologist.  We can make a referral if he lets [place] know who his gastroenterologist is.  He can come here for the stool cards and urinalysis if this cannot be arranged via his primary provider  Begin ferrous sulfate 325 mg twice daily, repeat CBC and ferritin level in 4-6 weeks, follow-up for office visit as scheduled

## 2023-03-17 DIAGNOSIS — D649 Anemia, unspecified: Secondary | ICD-10-CM | POA: Diagnosis not present

## 2023-03-18 ENCOUNTER — Other Ambulatory Visit: Payer: Self-pay

## 2023-03-18 DIAGNOSIS — D649 Anemia, unspecified: Secondary | ICD-10-CM | POA: Diagnosis not present

## 2023-03-18 LAB — OCCULT BLOOD X 1 CARD TO LAB, STOOL
Fecal Occult Bld: NEGATIVE
Fecal Occult Bld: NEGATIVE
Fecal Occult Bld: NEGATIVE

## 2023-03-28 NOTE — Progress Notes (Signed)
Remote pacemaker transmission.   

## 2023-04-26 ENCOUNTER — Encounter: Payer: Self-pay | Admitting: Physician Assistant

## 2023-04-26 ENCOUNTER — Inpatient Hospital Stay: Payer: Medicare Other | Attending: Oncology

## 2023-04-26 DIAGNOSIS — C080 Malignant neoplasm of submandibular gland: Secondary | ICD-10-CM | POA: Diagnosis not present

## 2023-04-26 DIAGNOSIS — D649 Anemia, unspecified: Secondary | ICD-10-CM | POA: Diagnosis present

## 2023-04-26 LAB — CBC WITH DIFFERENTIAL (CANCER CENTER ONLY)
Abs Immature Granulocytes: 0 10*3/uL (ref 0.00–0.07)
Basophils Absolute: 0 10*3/uL (ref 0.0–0.1)
Basophils Relative: 0 %
Eosinophils Absolute: 0.1 10*3/uL (ref 0.0–0.5)
Eosinophils Relative: 1 %
HCT: 41.2 % (ref 39.0–52.0)
Hemoglobin: 13.9 g/dL (ref 13.0–17.0)
Immature Granulocytes: 0 %
Lymphocytes Relative: 28 %
Lymphs Abs: 1.1 10*3/uL (ref 0.7–4.0)
MCH: 31 pg (ref 26.0–34.0)
MCHC: 33.7 g/dL (ref 30.0–36.0)
MCV: 92 fL (ref 80.0–100.0)
Monocytes Absolute: 0.5 10*3/uL (ref 0.1–1.0)
Monocytes Relative: 12 %
Neutro Abs: 2.3 10*3/uL (ref 1.7–7.7)
Neutrophils Relative %: 59 %
Platelet Count: 190 10*3/uL (ref 150–400)
RBC: 4.48 MIL/uL (ref 4.22–5.81)
RDW: 16.3 % — ABNORMAL HIGH (ref 11.5–15.5)
WBC Count: 3.9 10*3/uL — ABNORMAL LOW (ref 4.0–10.5)
nRBC: 0 % (ref 0.0–0.2)

## 2023-04-26 LAB — VITAMIN B12: Vitamin B-12: 253 pg/mL (ref 180–914)

## 2023-04-26 LAB — FERRITIN: Ferritin: 14 ng/mL — ABNORMAL LOW (ref 24–336)

## 2023-04-27 ENCOUNTER — Telehealth: Payer: Self-pay | Admitting: *Deleted

## 2023-04-27 DIAGNOSIS — D649 Anemia, unspecified: Secondary | ICD-10-CM

## 2023-04-27 NOTE — Telephone Encounter (Signed)
-----   Message from Thornton Papas sent at 04/26/2023  3:02 PM EDT ----- Please call patient, the B12 level is now normal, the iron level remains low, increase ferrous sulfate to 325 mg 3 times daily, repeat CBC and ferritin at the next visit

## 2023-04-27 NOTE — Telephone Encounter (Signed)
Jonathan [name] made aware of low iron and normal B12. He agrees to increase iron to tid. States he stopped his iron for ~ 1 week to see if his stools would return to normal color and they did. He will resume. Recheck at next visit.

## 2023-06-13 ENCOUNTER — Ambulatory Visit: Payer: Medicare Other

## 2023-06-13 DIAGNOSIS — I440 Atrioventricular block, first degree: Secondary | ICD-10-CM | POA: Diagnosis not present

## 2023-06-13 LAB — CUP PACEART REMOTE DEVICE CHECK
Battery Remaining Longevity: 126 mo
Battery Voltage: 3.02 V
Brady Statistic AP VP Percent: 46.03 %
Brady Statistic AP VS Percent: 0 %
Brady Statistic AS VP Percent: 53.63 %
Brady Statistic AS VS Percent: 0.33 %
Brady Statistic RA Percent Paced: 45.85 %
Brady Statistic RV Percent Paced: 99.66 %
Date Time Interrogation Session: 20241103230117
Implantable Lead Connection Status: 753985
Implantable Lead Connection Status: 753985
Implantable Lead Implant Date: 20230127
Implantable Lead Implant Date: 20230127
Implantable Lead Location: 753859
Implantable Lead Location: 753860
Implantable Lead Model: 3830
Implantable Lead Model: 5076
Implantable Pulse Generator Implant Date: 20230127
Lead Channel Impedance Value: 285 Ohm
Lead Channel Impedance Value: 418 Ohm
Lead Channel Impedance Value: 456 Ohm
Lead Channel Impedance Value: 646 Ohm
Lead Channel Pacing Threshold Amplitude: 0.75 V
Lead Channel Pacing Threshold Amplitude: 1.125 V
Lead Channel Pacing Threshold Pulse Width: 0.4 ms
Lead Channel Pacing Threshold Pulse Width: 0.4 ms
Lead Channel Sensing Intrinsic Amplitude: 1 mV
Lead Channel Sensing Intrinsic Amplitude: 1 mV
Lead Channel Sensing Intrinsic Amplitude: 18.375 mV
Lead Channel Sensing Intrinsic Amplitude: 18.375 mV
Lead Channel Setting Pacing Amplitude: 1.5 V
Lead Channel Setting Pacing Amplitude: 2.25 V
Lead Channel Setting Pacing Pulse Width: 0.4 ms
Lead Channel Setting Sensing Sensitivity: 0.9 mV
Zone Setting Status: 755011
Zone Setting Status: 755011

## 2023-06-27 ENCOUNTER — Telehealth: Payer: Self-pay

## 2023-06-27 ENCOUNTER — Inpatient Hospital Stay: Payer: Medicare Other

## 2023-06-27 ENCOUNTER — Inpatient Hospital Stay: Payer: Medicare Other | Attending: Oncology | Admitting: Oncology

## 2023-06-27 ENCOUNTER — Other Ambulatory Visit: Payer: Self-pay | Admitting: *Deleted

## 2023-06-27 VITALS — BP 136/72 | HR 64 | Temp 97.8°F | Resp 18 | Wt 222.3 lb

## 2023-06-27 DIAGNOSIS — D649 Anemia, unspecified: Secondary | ICD-10-CM | POA: Insufficient documentation

## 2023-06-27 DIAGNOSIS — Z96653 Presence of artificial knee joint, bilateral: Secondary | ICD-10-CM | POA: Insufficient documentation

## 2023-06-27 DIAGNOSIS — E119 Type 2 diabetes mellitus without complications: Secondary | ICD-10-CM | POA: Diagnosis not present

## 2023-06-27 DIAGNOSIS — Z955 Presence of coronary angioplasty implant and graft: Secondary | ICD-10-CM | POA: Diagnosis not present

## 2023-06-27 DIAGNOSIS — I2510 Atherosclerotic heart disease of native coronary artery without angina pectoris: Secondary | ICD-10-CM | POA: Insufficient documentation

## 2023-06-27 DIAGNOSIS — E538 Deficiency of other specified B group vitamins: Secondary | ICD-10-CM | POA: Diagnosis not present

## 2023-06-27 DIAGNOSIS — C4442 Squamous cell carcinoma of skin of scalp and neck: Secondary | ICD-10-CM

## 2023-06-27 DIAGNOSIS — Z951 Presence of aortocoronary bypass graft: Secondary | ICD-10-CM | POA: Diagnosis not present

## 2023-06-27 DIAGNOSIS — Z85820 Personal history of malignant melanoma of skin: Secondary | ICD-10-CM | POA: Insufficient documentation

## 2023-06-27 DIAGNOSIS — Z85819 Personal history of malignant neoplasm of unspecified site of lip, oral cavity, and pharynx: Secondary | ICD-10-CM | POA: Insufficient documentation

## 2023-06-27 DIAGNOSIS — R32 Unspecified urinary incontinence: Secondary | ICD-10-CM | POA: Diagnosis not present

## 2023-06-27 LAB — BASIC METABOLIC PANEL - CANCER CENTER ONLY
Anion gap: 10 (ref 5–15)
BUN: 17 mg/dL (ref 8–23)
CO2: 26 mmol/L (ref 22–32)
Calcium: 9.9 mg/dL (ref 8.9–10.3)
Chloride: 103 mmol/L (ref 98–111)
Creatinine: 0.98 mg/dL (ref 0.61–1.24)
GFR, Estimated: >60 mL/min (ref >60)
Glucose, Bld: 168 mg/dL — ABNORMAL HIGH (ref 70–99)
Potassium: 4.4 mmol/L (ref 3.5–5.1)
Sodium: 139 mmol/L (ref 135–145)

## 2023-06-27 LAB — CBC WITH DIFFERENTIAL (CANCER CENTER ONLY)
Abs Immature Granulocytes: 0.01 10*3/uL (ref 0.00–0.07)
Basophils Absolute: 0 10*3/uL (ref 0.0–0.1)
Basophils Relative: 0 %
Eosinophils Absolute: 0 10*3/uL (ref 0.0–0.5)
Eosinophils Relative: 1 %
HCT: 40.5 % (ref 39.0–52.0)
Hemoglobin: 13.9 g/dL (ref 13.0–17.0)
Immature Granulocytes: 0 %
Lymphocytes Relative: 28 %
Lymphs Abs: 1 10*3/uL (ref 0.7–4.0)
MCH: 31.7 pg (ref 26.0–34.0)
MCHC: 34.3 g/dL (ref 30.0–36.0)
MCV: 92.3 fL (ref 80.0–100.0)
Monocytes Absolute: 0.4 10*3/uL (ref 0.1–1.0)
Monocytes Relative: 12 %
Neutro Abs: 2.1 10*3/uL (ref 1.7–7.7)
Neutrophils Relative %: 59 %
Platelet Count: 168 10*3/uL (ref 150–400)
RBC: 4.39 MIL/uL (ref 4.22–5.81)
RDW: 14.9 % (ref 11.5–15.5)
WBC Count: 3.5 10*3/uL — ABNORMAL LOW (ref 4.0–10.5)
nRBC: 0 % (ref 0.0–0.2)

## 2023-06-27 LAB — FERRITIN: Ferritin: 21 ng/mL — ABNORMAL LOW (ref 24–336)

## 2023-06-27 LAB — VITAMIN B12: Vitamin B-12: 365 pg/mL (ref 180–914)

## 2023-06-27 NOTE — Telephone Encounter (Signed)
-----   Message from Thornton Papas sent at 06/27/2023  3:02 PM EST ----- Please call patient with ferritin level remains low, continue iron, follow-up with GI and follow-up here as scheduled

## 2023-06-27 NOTE — Telephone Encounter (Signed)
Patient gave verbal understanding had no further questions or concerns. 

## 2023-06-27 NOTE — Progress Notes (Signed)
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  He feels well.  No bleeding.  Good appetite and energy level.  He is participating in exercises classes  Objective:  Vital signs in last 24 hours:  Blood pressure 136/72, pulse 64, temperature 97.8 F (36.6 C), temperature source Temporal, resp. rate 18, weight 222 lb 4.8 oz (100.8 kg), SpO2 99%.    HEENT:.  Oropharynx without visible mass, neck without mass Lymphatics: No cervical, supraclavicular, axillary, or inguinal nodes Resp: Lungs clear bilaterally Cardio: Regular rhythm with premature beats GI: No hepatosplenomegaly Vascular: Trace lower pretibial edema bilaterally    Portacath/PICC-without erythema  Lab Results:  Lab Results  Component Value Date   WBC 3.5 (L) 06/27/2023   HGB 13.9 06/27/2023   HCT 40.5 06/27/2023   MCV 92.3 06/27/2023   PLT 168 06/27/2023   NEUTROABS 2.1 06/27/2023    CMP  Lab Results  Component Value Date   NA 138 03/15/2023   K 4.4 03/15/2023   CL 101 03/15/2023   CO2 29 03/15/2023   GLUCOSE 165 (H) 03/15/2023   BUN 18 03/15/2023   CREATININE 0.85 03/15/2023   CALCIUM 9.6 03/15/2023   PROT 6.7 03/15/2023   ALBUMIN 4.3 03/15/2023   AST 19 03/15/2023   ALT 10 03/15/2023   ALKPHOS 45 03/15/2023   BILITOT 0.6 03/15/2023   GFRNONAA >60 03/15/2023   GFRAA >60 07/18/2019    No results found for: "CEA1", "CEA", "CAN199", "CA125"  Lab Results  Component Value Date   INR 1.07 08/14/2014   LABPROT 14.0 08/14/2014    Imaging:  No results found.  Medications: I have reviewed the patient's current medications.   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  He will be referred for restaging CTs of the neck, chest, abdomen, and pelvis within the next few weeks.  He was noted to have mild earlier this year.  Stool Hemoccult cards were negative.  He denies bleeding.  He reports a negative urinalysis by urology within the past few weeks.  He is scheduled to see gastroenterology in December.  I encouraged him  to resume vitamin B12 replacement.  We will follow-up on the ferritin level from today.  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  Thornton Papas,  MD  06/27/2023  11:44 AM

## 2023-06-28 ENCOUNTER — Telehealth: Payer: Self-pay

## 2023-06-28 NOTE — Telephone Encounter (Signed)
-----   Message from Thornton Papas sent at 06/27/2023  8:10 PM EST ----- Please call patient level is normal, resume b12 therapy, f/u as scheduled

## 2023-06-28 NOTE — Telephone Encounter (Signed)
Patient gave verbal understanding and had no further questions or concerns  

## 2023-06-30 NOTE — Telephone Encounter (Signed)
Telephone call  

## 2023-07-04 ENCOUNTER — Ambulatory Visit (HOSPITAL_BASED_OUTPATIENT_CLINIC_OR_DEPARTMENT_OTHER)
Admission: RE | Admit: 2023-07-04 | Discharge: 2023-07-04 | Disposition: A | Discharge status: Home / Self Care | Payer: Medicare Other | Source: Ambulatory Visit | Attending: Oncology | Admitting: Oncology

## 2023-07-04 ENCOUNTER — Encounter (HOSPITAL_BASED_OUTPATIENT_CLINIC_OR_DEPARTMENT_OTHER): Payer: Self-pay

## 2023-07-04 DIAGNOSIS — C4442 Squamous cell carcinoma of skin of scalp and neck: Secondary | ICD-10-CM

## 2023-07-04 MED ORDER — IOHEXOL 300 MG/ML  SOLN
100.0000 mL | Freq: Once | INTRAMUSCULAR | Status: AC | PRN
  Administered 2023-07-04: 100 mL via INTRAVENOUS

## 2023-07-05 NOTE — Progress Notes (Signed)
Remote pacemaker transmission.   

## 2023-07-13 ENCOUNTER — Telehealth: Payer: Self-pay

## 2023-07-13 ENCOUNTER — Inpatient Hospital Stay
Admission: RE | Admit: 2023-07-13 | Discharge: 2023-07-13 | Disposition: A | Discharge status: Home / Self Care | Payer: Self-pay | Source: Ambulatory Visit | Attending: Oncology | Admitting: Oncology

## 2023-07-13 ENCOUNTER — Other Ambulatory Visit: Payer: Self-pay | Admitting: *Deleted

## 2023-07-13 DIAGNOSIS — C4442 Squamous cell carcinoma of skin of scalp and neck: Secondary | ICD-10-CM

## 2023-07-13 NOTE — Telephone Encounter (Signed)
-----   Message from Thornton Papas sent at 07/12/2023  7:55 PM EST ----- Please call patient, CTs show no evidence of recurrent cancer, f/u as scheduled

## 2023-07-13 NOTE — Progress Notes (Signed)
Dr. Truett Perna requesting current scans be compared with PET scan from MD Dareen Piano of 05/06/21. Notified radiology to please upload this scan. Also called [facility] Radiology to request CT neck be read.

## 2023-07-13 NOTE — Telephone Encounter (Signed)
Patient gave verbal understanding and had no further questions or concerns  

## 2023-07-14 ENCOUNTER — Ambulatory Visit (INDEPENDENT_AMBULATORY_CARE_PROVIDER_SITE_OTHER): Payer: Medicare Other | Admitting: Physician Assistant

## 2023-07-14 ENCOUNTER — Encounter: Payer: Self-pay | Admitting: Physician Assistant

## 2023-07-14 VITALS — BP 120/78 | HR 67 | Ht 74.0 in | Wt 220.0 lb

## 2023-07-14 DIAGNOSIS — E611 Iron deficiency: Secondary | ICD-10-CM | POA: Diagnosis not present

## 2023-07-14 DIAGNOSIS — D509 Iron deficiency anemia, unspecified: Secondary | ICD-10-CM

## 2023-07-14 MED ORDER — FERROUS SULFATE 325 (65 FE) MG PO TBEC: 325.0000 mg | DELAYED_RELEASE_TABLET | Freq: Two times a day (BID) | ORAL | 3 refills | Status: AC

## 2023-07-14 NOTE — Patient Instructions (Signed)
Please have labs completed the first week of February.  Increase Iron to twice a day.  Follow the instructions on the Hemoccult cards and mail them back to [place] when you are finished or you may take them directly to the lab in the basement of the [facility] building. We will call you with the results.   _______________________________________________________  If your blood pressure at your visit was 140/90 or greater, please contact your primary care physician to follow up on this.  _______________________________________________________  If you are age 55 or older, your body mass index should be between 23-30. Your Body mass index is 28.25 kg/m. If this is out of the aforementioned range listed, please consider follow up with your Primary Care Provider.  If you are age 8 or younger, your body mass index should be between 19-25. Your Body mass index is 28.25 kg/m. If this is out of the aformentioned range listed, please consider follow up with your Primary Care Provider.   ________________________________________________________  The  GI providers would like to encourage you to use [facility] to communicate with providers for non-urgent requests or questions.  Due to long hold times on the telephone, sending your provider a message by [facility] may be a faster and more efficient way to get a response.  Please allow 48 business hours for a response.  Please remember that this is for non-urgent requests.  _______________________________________________________

## 2023-07-14 NOTE — Progress Notes (Signed)
Subjective:    Patient ID: Jonathan [name], male    DOB: May 09, 1936, 87 y.o.   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 Mancel Bale for evaluation of iron deficiency.  He is known remotely to Dr. Lina Sar having undergone colonoscopy in 2007 which showed mild pandiverticulosis and no polyps. Patient has history of squamous cell cancer of the right submandibular gland and has undergone surgery, then radiation in 2022 and has been under surveillance since.  He also has history of adult onset diabetes mellitus, osteoarthritis, coronary artery disease for which he underwent CABG in 2005 and then stent in 2020, prior MI, hypertension, and is status post pacemaker for a trifascicular block.  He has also had some squamous cell skin cancers. On routine labs being done by Dr. Truett Perna August 2024 hemoglobin 13.5/hematocrit 40.5/MCV 89.6, LFTs within normal limits Ferritin 11 B12 101 Multiple myeloma panel negative Subsequent stool for occult blood x 3 negative  He had previously been on a B12 supplement and had stopped that for a period of time B12 has been resumed and as of 04/26/2023 B12 253 and on repeat in November up to 365  Labs yesterday WBC 3.7/hemoglobin 14.5/hematocrit 43.2/platelets 187 Ferritin 04/26/2023's 14  He has been on a generic ferrous sulfate supplement 325 mg generally just taking it once a day but says sometimes he will take 2 or 3/day  He underwent CT of the chest abdomen and pelvis on 07/04/2023 which showed no evidence of lymphadenopathy or metastatic disease in the chest abdomen or pelvis, small intermittent attenuation subcapsular collection about the lateral spleen measuring 1 cm, cholelithiasis, mild prostatomegaly, coronary artery disease  CT soft tissue neck-postsurgical/posttreatment changes within the right submandibular space and right upper neck no evidence of residual or recurrent mass unremarkable  appearance of the parotid and left submandibular glands, no adenopathy  He says he feels fine, says he stools are dark on iron but he has never seen any melena or hematochezia.  He has no GI complaints, specifically no changes in bowel habits, no abdominal pain, he does have history of GERD and is on low-dose PPI omeprazole 20 mg daily chronically which controls symptoms.  No complaints of dysphagia or odynophagia. To his knowledge he has not been iron deficient or anemic in the past.  Review of Systems Pertinent positive and negative review of systems were noted in the above HPI section.  All other review of systems was otherwise negative.   Outpatient Encounter Medications as of 07/14/2023  Medication Sig   allopurinol (ZYLOPRIM) 300 MG tablet Take 1 tablet (300 mg total) by mouth daily.   aspirin EC 81 MG tablet Take 81 mg by mouth daily.   benazepril-hydrochlorthiazide (LOTENSIN HCT) 20-25 MG tablet Take 1 tablet by mouth daily.   cetirizine (ZYRTEC) 10 MG tablet Take 10 mg by mouth daily as needed for allergies.   docusate sodium (COLACE) 100 MG capsule Take 100 mg by mouth daily.   furosemide (LASIX) 20 MG tablet Take 1 tablet (20 mg total) by mouth daily as needed for fluid or edema.   glipiZIDE (GLUCOTROL) 5 MG tablet Take 1 tablet (5 mg total) by mouth 2 (two) times daily.   glucose blood (FREESTYLE LITE) test strip USE TWICE A DAY AS DIRECTED (E11.9)   insulin glargine (LANTUS) 100 UNIT/ML injection Inject 5-50 Units into the skin 2 (two) times daily. Per sliding scale   levothyroxine (SYNTHROID, LEVOTHROID) 100 MCG tablet  Take 1 tablet (100 mcg total) by mouth every morning.   metFORMIN (GLUCOPHAGE) 1000 MG tablet Take 1 tablet (1,000 mg total) by mouth 2 (two) times daily with a meal.   metoprolol succinate (TOPROL XL) 25 MG 24 hr tablet Take 0.5 tablets (12.5 mg total) by mouth daily. Take after meal, at NIGHT. (Patient taking differently: Take 25 mg by mouth daily. Take after meal,  at NIGHT.)   Multiple Vitamin (MULTIVITAMIN PO) Take 1 tablet by mouth daily.     nitroGLYCERIN (NITROSTAT) 0.4 MG SL tablet Place 1 tablet (0.4 mg total) under the tongue every 5 (five) minutes x 3 doses as needed for chest pain.   Omega-3 Fatty Acids (FISH OIL) 1000 MG CAPS Take 1,000 mg by mouth daily.    omeprazole (PRILOSEC) 20 MG capsule Take 1 capsule (20 mg total) by mouth daily as needed (heartburn or acid reflux).   oxybutynin (DITROPAN XL) 15 MG 24 hr tablet Take 15 mg by mouth daily.   oxymetazoline (AFRIN) 0.05 % nasal spray Place 1 spray into both nostrils 2 (two) times daily as needed for congestion.   pioglitazone (ACTOS) 45 MG tablet Take 1 tablet (45 mg total) by mouth daily.   polycarbophil (FIBERCON) 625 MG tablet Take 1,250 mg by mouth 2 (two) times daily.   polyethylene glycol (MIRALAX / GLYCOLAX) packet Take 17 g by mouth daily as needed for moderate constipation.   Probiotic Product (ALIGN) 4 MG CAPS Take 1 capsule (4 mg total) by mouth daily.   simvastatin (ZOCOR) 40 MG tablet Take 1 tablet (40 mg total) by mouth at bedtime.   tamsulosin (FLOMAX) 0.4 MG CAPS capsule Take 0.4 mg by mouth daily.   [DISCONTINUED] ferrous sulfate 325 (65 FE) MG EC tablet Take 325 mg by mouth 3 (three) times daily with meals.   ferrous sulfate 325 (65 FE) MG EC tablet Take 1 tablet (325 mg total) by mouth in the morning and at bedtime.   No facility-administered encounter medications on file as of 07/14/2023.   Allergies  Allergen Reactions   Bee Venom Anaphylaxis   Patient Active Problem List   Diagnosis Date Noted   Pacemaker 01/22/2022   History of retinal detachment 01/18/2022   Exudative age-related macular degeneration of left eye with active choroidal neovascularization (HCC) 12/02/2021   Serous detachment of retinal pigment epithelium, left 12/02/2021   Intermediate stage nonexudative age-related macular degeneration of right eye 12/02/2021   Posterior capsular opacification,  right eye 12/02/2021   Block, trifascicular 08/26/2021   NSTEMI (non-ST elevated myocardial infarction) (HCC) 07/16/2019   Atypical nevus of shoulder 12/17/2014   Primary localized osteoarthritis of right knee 08/22/2014   Primary osteoarthritis of right knee 08/22/2014   First degree AV block 02/04/2014   GERD (gastroesophageal reflux disease) 12/06/2013   Blindness of right eye 12/04/2013   Total knee replacement status 09/18/2013   Knee pain, bilateral 11/30/2012   Foreign body granuloma of soft tissue, NEC, right lower leg 11/30/2012   Edema 10/25/2011   Coronary atherosclerosis 06/26/2009   RENAL CALCULUS, RECURRENT 01/03/2009   Benign prostatic hyperplasia 05/30/2007   Hypothyroidism 04/07/2007   Type 2 diabetes mellitus with complication (HCC) 04/07/2007   Hyperlipidemia 04/07/2007   Gout 04/07/2007   Essential hypertension 04/07/2007   DERMATITIS, CNTCT, ACUTE D/T SOLAR RADIATION 04/07/2007   HEPATITIS B, HX OF 04/07/2007   Social History   Socioeconomic History   Marital status: Married    Spouse name: Not on file   Number  of children: Not on file   Years of education: Not on file   Highest education level: Not on file  Occupational History   Occupation: retired  Tobacco Use   Smoking status: Former    Current packs/day: 0.00    Average packs/day: 1 pack/day for 10.0 years (10.0 ttl pk-yrs)    Types: Cigarettes    Start date: 09/10/1955    Quit date: 09/09/1965    Years since quitting: 57.8   Smokeless tobacco: Never  Vaping Use   Vaping status: Never Used  Substance and Sexual Activity   Alcohol use: Yes    Comment: Glass of wine on social occasion or out to ear   Drug use: No   Sexual activity: Not Currently  Other Topics Concern   Not on file  Social History Narrative   Pt lives in [place].  Retired Designer, industrial/product (retired 2005)   Social Determinants of Corporate investment banker Strain: Not on [employer] Insecurity: Not on file  Transportation  Needs: Not on file  Physical Activity: Not on file  Stress: Not on file  Social Connections: Not on file  Intimate Partner Violence: Not on file    Mr. Odegaard family history includes Diabetes in an other family member; Hypertension in an other family member.      Objective:    Vitals:   07/14/23 1124  BP: 120/78  Pulse: 67  SpO2: 92%    Physical Exam Well-developed well-nourished elderly WM in no acute distress.  accompanied by his wife height, [id number],010  BMI28.25  HEENT; nontraumatic normocephalic, EOMI, PE R LA, sclera anicteric.  Extremities; no clubbing cyanosis or edema skin warm and dry Neuro/Psych; alert and oriented x4, grossly nonfocal mood and affect appropriate        Assessment & Plan:   #64  87 year old white male with new finding of iron deficiency with low ferritin initially documented August 2024 He is not anemic, hemoglobins have been within normal range. Most recent ferritin 21 He has been on lower dose iron supplementation over the past few months ferrous sulfate 325 varying doses, most days once daily  Hemoccults have been negative CT imaging within the past couple of weeks unremarkable chest abdomen and pelvis  Etiology of the iron deficiency is not clear, cannot rule out occult intermittent GI blood losses but certainly has not had any evidence of any obvious blood loss and recent stool for occult blood's have been negative Consider malabsorption-has been on low-dose PPI long-term  #2 history of B12 deficiency-improved now back on supplement #3 negative colonoscopy 2007 with exception of pandiverticulosis  #4 history of squamous cell cancer right submandibular status post surgery and radiation 2022 #5 coronary artery disease status post CABG and stents #6.  Hypertension #7.  Status post pacemaker/heart block #8.  Adult onset diabetes mellitus #9.  Hypothyroidism  Plan, discussed options with the patient, regarding pursuing endoscopic  evaluation at this time which would include EGD and colonoscopy and if negative could consider capsule endoscopy, however again not anemic and Hemoccults have been negative  Also discussed increasing oral iron supplementation over the next few months, then reassess with iron studies repeat CBC and stool studies. Patient would like to avoid endoscopic evaluation if possible and we will plan to increase ferrous sulfate to 325 mg p.o. twice daily every day, and have scheduled for repeat Hemoccults, CBC, and anemia panel early February 2025. Will decide regarding need for further intervention pending those labs Patient will be established  with Dr. America Brown Orlander Norwood PA-C 07/14/2023   Cc: Ladene Artist, MD

## 2023-07-29 NOTE — Progress Notes (Signed)
Addendum: Reviewed and agree with assessment and management plan. Kadijah Shamoon M, MD  

## 2023-09-12 ENCOUNTER — Ambulatory Visit (INDEPENDENT_AMBULATORY_CARE_PROVIDER_SITE_OTHER): Payer: Medicare Other

## 2023-09-12 DIAGNOSIS — I440 Atrioventricular block, first degree: Secondary | ICD-10-CM

## 2023-09-12 LAB — CUP PACEART REMOTE DEVICE CHECK
Battery Remaining Longevity: 131 mo
Battery Voltage: 3.02 V
Brady Statistic AP VP Percent: 42.56 %
Brady Statistic AP VS Percent: 0 %
Brady Statistic AS VP Percent: 56.56 %
Brady Statistic AS VS Percent: 0.88 %
Brady Statistic RA Percent Paced: 42.5 %
Brady Statistic RV Percent Paced: 99.12 %
Date Time Interrogation Session: 20250203061847
Implantable Lead Connection Status: 753985
Implantable Lead Connection Status: 753985
Implantable Lead Implant Date: 20230127
Implantable Lead Implant Date: 20230127
Implantable Lead Location: 753859
Implantable Lead Location: 753860
Implantable Lead Model: 3830
Implantable Lead Model: 5076
Implantable Pulse Generator Implant Date: 20230127
Lead Channel Impedance Value: 285 Ohm
Lead Channel Impedance Value: 456 Ohm
Lead Channel Impedance Value: 475 Ohm
Lead Channel Impedance Value: 665 Ohm
Lead Channel Pacing Threshold Amplitude: 0.75 V
Lead Channel Pacing Threshold Amplitude: 1 V
Lead Channel Pacing Threshold Pulse Width: 0.4 ms
Lead Channel Pacing Threshold Pulse Width: 0.4 ms
Lead Channel Sensing Intrinsic Amplitude: 1 mV
Lead Channel Sensing Intrinsic Amplitude: 1 mV
Lead Channel Sensing Intrinsic Amplitude: 17.875 mV
Lead Channel Sensing Intrinsic Amplitude: 17.875 mV
Lead Channel Setting Pacing Amplitude: 1.75 V
Lead Channel Setting Pacing Amplitude: 2 V
Lead Channel Setting Pacing Pulse Width: 0.4 ms
Lead Channel Setting Sensing Sensitivity: 0.9 mV
Zone Setting Status: 755011
Zone Setting Status: 755011

## 2023-09-17 ENCOUNTER — Encounter: Payer: Self-pay | Admitting: Cardiology

## 2023-10-19 NOTE — Progress Notes (Signed)
 Remote pacemaker transmission.

## 2023-10-25 ENCOUNTER — Ambulatory Visit: Payer: Medicare Other | Admitting: Oncology

## 2023-10-25 ENCOUNTER — Other Ambulatory Visit: Payer: Self-pay

## 2023-10-25 ENCOUNTER — Other Ambulatory Visit: Payer: Medicare Other

## 2023-10-27 ENCOUNTER — Inpatient Hospital Stay: Payer: Medicare Other | Attending: Oncology

## 2023-10-27 ENCOUNTER — Inpatient Hospital Stay (HOSPITAL_BASED_OUTPATIENT_CLINIC_OR_DEPARTMENT_OTHER): Payer: Medicare Other | Admitting: Oncology

## 2023-10-27 VITALS — BP 112/60 | HR 65 | Temp 98.1°F | Resp 18 | Ht 74.0 in | Wt 220.0 lb

## 2023-10-27 DIAGNOSIS — Z85828 Personal history of other malignant neoplasm of skin: Secondary | ICD-10-CM | POA: Diagnosis present

## 2023-10-27 DIAGNOSIS — D649 Anemia, unspecified: Secondary | ICD-10-CM

## 2023-10-27 DIAGNOSIS — Z85820 Personal history of malignant melanoma of skin: Secondary | ICD-10-CM | POA: Insufficient documentation

## 2023-10-27 DIAGNOSIS — C4442 Squamous cell carcinoma of skin of scalp and neck: Secondary | ICD-10-CM

## 2023-10-27 LAB — CBC WITH DIFFERENTIAL (CANCER CENTER ONLY)
Abs Immature Granulocytes: 0.02 10*3/uL (ref 0.00–0.07)
Basophils Absolute: 0 10*3/uL (ref 0.0–0.1)
Basophils Relative: 0 %
Eosinophils Absolute: 0.1 10*3/uL (ref 0.0–0.5)
Eosinophils Relative: 1 %
HCT: 40.7 % (ref 39.0–52.0)
Hemoglobin: 14.2 g/dL (ref 13.0–17.0)
Immature Granulocytes: 0 %
Lymphocytes Relative: 27 %
Lymphs Abs: 1.3 10*3/uL (ref 0.7–4.0)
MCH: 32.6 pg (ref 26.0–34.0)
MCHC: 34.9 g/dL (ref 30.0–36.0)
MCV: 93.6 fL (ref 80.0–100.0)
Monocytes Absolute: 0.5 10*3/uL (ref 0.1–1.0)
Monocytes Relative: 11 %
Neutro Abs: 2.8 10*3/uL (ref 1.7–7.7)
Neutrophils Relative %: 61 %
Platelet Count: 184 10*3/uL (ref 150–400)
RBC: 4.35 MIL/uL (ref 4.22–5.81)
RDW: 14.1 % (ref 11.5–15.5)
WBC Count: 4.6 10*3/uL (ref 4.0–10.5)
nRBC: 0 % (ref 0.0–0.2)

## 2023-10-27 LAB — FERRITIN: Ferritin: 39 ng/mL (ref 24–336)

## 2023-10-27 LAB — VITAMIN B12: Vitamin B-12: 156 pg/mL — ABNORMAL LOW (ref 180–914)

## 2023-10-27 NOTE — Progress Notes (Signed)
 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  He feels well.  No difficulty with bowel function.  No bleeding.  He discontinued iron and his stool is normal in color.  Good appetite and energy level.  He is scheduled to see dermatology next month.  Objective:  Vital signs in last 24 hours:  Blood pressure 112/60, pulse 65, temperature 98.1 F (36.7 C), resp. rate 18, height 6\' 2"  (1.88 m), weight 220 lb (99.8 kg).    HEENT: The oral cavity is dry.  No mass.  Radiation/surgical changes at the right neck without evidence of recurrent tumor Lymphatics: No cervical, supraclavicular, axillary, or inguinal nodes Resp: Lungs clear bilaterally Cardio: Irregular GI: No hepatosplenomegaly, nontender, no mass Vascular: No leg edema  Skin: Multiple benign-appearing moles over the trunk, scaling lesions over the head  Lab Results:  Lab Results  Component Value Date   WBC 4.6 10/27/2023   HGB 14.2 10/27/2023   HCT 40.7 10/27/2023   MCV 93.6 10/27/2023   PLT 184 10/27/2023   NEUTROABS 2.8 10/27/2023    CMP  Lab Results  Component Value Date   NA 139 06/27/2023   K 4.4 06/27/2023   CL 103 06/27/2023   CO2 26 06/27/2023   GLUCOSE 168 (H) 06/27/2023   BUN 17 06/27/2023   CREATININE 0.98 06/27/2023   CALCIUM 9.9 06/27/2023   PROT 6.7 03/15/2023   ALBUMIN 4.3 03/15/2023   AST 19 03/15/2023   ALT 10 03/15/2023   ALKPHOS 45 03/15/2023   BILITOT 0.6 03/15/2023   GFRNONAA >60 06/27/2023   GFRAA >60 07/18/2019     Medications: I have reviewed the patient's current medications.   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  Negative resection margins, favored to represent a metastasis from a skin or oral primary Adjuvant radiation, 6000 cGy in 30 fractions, 01/16/2021 - 02/26/2021 MRI neck 05/04/2021-postsurgical/treatment changes, no definite evidence of residual tumor, no lymphadenopathy PET 05/06/2021-interval resection of right submandibular tumor, no recurrent carcinoma CTs Neck and CAP 07/04/2023: Postsurgical changes in the right submandibular area, new subcapsular collection in the lateral spleen-1 cm thickness-likely a small subcapsular hematoma, no evidence of metastatic disease History of melanoma in situ of the left lower leg 08/05/2017 Squamous cell carcinoma of the anterior chest September 2022, wide excision November 2022 Squamous cell carcinoma in situ left lip September 2022 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 07/19/2016 Diabetes History of detached retina Coronary artery disease, coronary artery bypass surgery 2005, coronary stent 2020 Urinary incontinence Bilateral knee replacement Squamous cell carcinoma of the right lower lip February 2023 Mild normocytic anemia 2024 Low ferritin August 2024 Low vitamin B12 03/15/2023 Stool Hemoccults negative August 2024       Disposition: Jonathan [name] remains in clinical remission from the squamous cell carcinoma.  Surveillance CTs in November revealed no evidence of recurrent disease.  The plan is to continue observation.  He has a history of iron deficiency and B12 deficiency.  He is no longer taking  replacement therapy.  We will follow-up on the ferritin and vitamin B12 levels from today.  He is not anemic.  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  Thornton Papas, MD  10/27/2023  10:40 AM

## 2023-10-28 ENCOUNTER — Telehealth: Payer: Self-pay | Admitting: *Deleted

## 2023-10-28 DIAGNOSIS — D649 Anemia, unspecified: Secondary | ICD-10-CM

## 2023-10-28 NOTE — Telephone Encounter (Signed)
-----   Message from Thornton Papas sent at 10/27/2023  5:45 PM EDT ----- Please call patient, the levels in the low normal range, vitamin B12 is low.  Resume taking vitamin B12 1000 mcg daily, repeat vitamin B12 and ferritin levels in 3 months

## 2023-10-28 NOTE — Telephone Encounter (Signed)
 Made [name] aware of low B12 result and to resume his B12 daily and he agrees. Will recheck labs in 3 months. Scheduling message sent.

## 2023-11-22 LAB — HM DIABETES EYE EXAM

## 2023-12-12 ENCOUNTER — Ambulatory Visit (INDEPENDENT_AMBULATORY_CARE_PROVIDER_SITE_OTHER): Payer: Medicare Other

## 2023-12-12 DIAGNOSIS — I440 Atrioventricular block, first degree: Secondary | ICD-10-CM

## 2023-12-13 LAB — CUP PACEART REMOTE DEVICE CHECK
Battery Remaining Longevity: 119 mo
Battery Voltage: 3.01 V
Brady Statistic AP VP Percent: 44.16 %
Brady Statistic AP VS Percent: 0.01 %
Brady Statistic AS VP Percent: 55.28 %
Brady Statistic AS VS Percent: 0.56 %
Brady Statistic RA Percent Paced: 44.02 %
Brady Statistic RV Percent Paced: 99.43 %
Date Time Interrogation Session: 20250504224510
Implantable Lead Connection Status: 753985
Implantable Lead Connection Status: 753985
Implantable Lead Implant Date: 20230127
Implantable Lead Implant Date: 20230127
Implantable Lead Location: 753859
Implantable Lead Location: 753860
Implantable Lead Model: 3830
Implantable Lead Model: 5076
Implantable Pulse Generator Implant Date: 20230127
Lead Channel Impedance Value: 285 Ohm
Lead Channel Impedance Value: 437 Ohm
Lead Channel Impedance Value: 456 Ohm
Lead Channel Impedance Value: 646 Ohm
Lead Channel Pacing Threshold Amplitude: 0.875 V
Lead Channel Pacing Threshold Amplitude: 1 V
Lead Channel Pacing Threshold Pulse Width: 0.4 ms
Lead Channel Pacing Threshold Pulse Width: 0.4 ms
Lead Channel Sensing Intrinsic Amplitude: 1.5 mV
Lead Channel Sensing Intrinsic Amplitude: 1.5 mV
Lead Channel Sensing Intrinsic Amplitude: 19.125 mV
Lead Channel Sensing Intrinsic Amplitude: 19.125 mV
Lead Channel Setting Pacing Amplitude: 1.75 V
Lead Channel Setting Pacing Amplitude: 2.25 V
Lead Channel Setting Pacing Pulse Width: 0.4 ms
Lead Channel Setting Sensing Sensitivity: 0.9 mV
Zone Setting Status: 755011
Zone Setting Status: 755011

## 2023-12-14 ENCOUNTER — Encounter: Payer: Self-pay | Admitting: Cardiology

## 2024-01-03 ENCOUNTER — Ambulatory Visit (INDEPENDENT_AMBULATORY_CARE_PROVIDER_SITE_OTHER): Admitting: Family Medicine

## 2024-01-03 VITALS — BP 116/56 | HR 73 | Temp 97.6°F | Resp 18 | Ht 74.5 in | Wt 219.4 lb

## 2024-01-03 DIAGNOSIS — I440 Atrioventricular block, first degree: Secondary | ICD-10-CM | POA: Diagnosis not present

## 2024-01-03 DIAGNOSIS — Z794 Long term (current) use of insulin: Secondary | ICD-10-CM

## 2024-01-03 DIAGNOSIS — C080 Malignant neoplasm of submandibular gland: Secondary | ICD-10-CM

## 2024-01-03 DIAGNOSIS — E038 Other specified hypothyroidism: Secondary | ICD-10-CM | POA: Diagnosis not present

## 2024-01-03 DIAGNOSIS — R351 Nocturia: Secondary | ICD-10-CM

## 2024-01-03 DIAGNOSIS — K5904 Chronic idiopathic constipation: Secondary | ICD-10-CM | POA: Insufficient documentation

## 2024-01-03 DIAGNOSIS — I25810 Atherosclerosis of coronary artery bypass graft(s) without angina pectoris: Secondary | ICD-10-CM

## 2024-01-03 DIAGNOSIS — N401 Enlarged prostate with lower urinary tract symptoms: Secondary | ICD-10-CM

## 2024-01-03 DIAGNOSIS — I10 Essential (primary) hypertension: Secondary | ICD-10-CM

## 2024-01-03 DIAGNOSIS — K1120 Sialoadenitis, unspecified: Secondary | ICD-10-CM

## 2024-01-03 DIAGNOSIS — E1142 Type 2 diabetes mellitus with diabetic polyneuropathy: Secondary | ICD-10-CM

## 2024-01-03 DIAGNOSIS — D040 Carcinoma in situ of skin of lip: Secondary | ICD-10-CM

## 2024-01-03 DIAGNOSIS — M109 Gout, unspecified: Secondary | ICD-10-CM

## 2024-01-03 NOTE — Progress Notes (Signed)
 Assessment & Plan   Assessment/Plan:    Assessment & Plan Type 2 diabetes mellitus with neuropathy Long-standing type 2 diabetes mellitus for approximately 30 years, managed with oral hypoglycemics and insulin . Hemoglobin A1c was 7.4% in December, previously as low as 6.2%. Neuropathy with numbness and occasional burning in the legs, history of ingrown toenail requiring removal due to poor healing. Current regimen includes glipizide , metformin , pioglitazone , and sliding scale glargine. No significant hypoglycemic events. Cardiologist and previous GP managed diabetes without endocrinology referral. Discussion on newer diabetes medications like GLP-1s and SGLT2s, but no changes made. Self-manages insulin  dosing based on blood glucose levels, typically 7-10 units twice a week. - Continue glipizide , metformin , pioglitazone , and sliding scale glargine. - Monitor blood glucose levels regularly. - Consider discussing GLP-1s and SGLT2s in future visits if needed.  Coronary artery disease with history of CABG and stent Coronary artery disease with quintuple CABG 20 years ago and stent placed 3 years ago. Asymptomatic with no chest pain or dyspnea. Pacemaker placed a year ago for bradycardia. No current use of nitroglycerin . Managed by cardiologist.  Bradycardia with pacemaker Bradycardia with heart rate previously in the 40s and 50s, leading to pacemaker placement a year ago. Current heart rate is 72 bpm with pacemaker. - Monitor heart rate and pacemaker function.  Gout Gout well-managed with allopurinol . Occasional burning in the toe reminiscent of past episodes, but no recent flares. - Continue allopurinol .  Benign prostatic hyperplasia with urinary symptoms Benign prostatic hyperplasia with urinary symptoms managed with tamsulosin  and oxybutynin. Recent improvement in nocturia with tamsulosin , reducing nighttime urination to once or twice. Uses towels for urinary incontinence. Followed by urology  at [facility]. - Continue tamsulosin  and oxybutynin. - Follow up with urology as scheduled.  Chronic constipation Chronic constipation managed with Fibercon, probiotics, and occasional Miralax . History of impaction requiring ER visit. Discussion on probiotics and dietary modifications. Uses Miralax  if no bowel movement in three days. - Continue Fibercon, probiotics, and Miralax  as needed. - Consider dietary modifications to include more natural sources of probiotics.  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 Recent scans show no evidence of recurrence. Followed by oncologist for monitoring. - Continue follow-up with oncologist.      There are no discontinued medications.  Return in about 6 months (around 07/05/2024) for DM.        Subjective:   Encounter date: 01/03/2024  Jonathan [name] is a 88 y.o. male who has Hypothyroidism; Type 2 diabetes mellitus with complication (HCC); Hyperlipidemia; Gout; Essential hypertension; Coronary atherosclerosis; RENAL CALCULUS, RECURRENT; DERMATITIS, CNTCT, ACUTE D/T SOLAR RADIATION; HEPATITIS B, HX OF; Benign prostatic hyperplasia; Edema; Knee pain, bilateral; Foreign body granuloma of soft tissue, NEC, right lower leg; Total knee replacement status; Blindness of right eye; GERD (gastroesophageal reflux disease); First degree AV block; Primary localized osteoarthritis of right knee; Primary osteoarthritis of right knee; Atypical nevus of shoulder; NSTEMI (non-ST elevated myocardial infarction) (HCC); Block, trifascicular; Exudative age-related macular degeneration of left eye with active choroidal neovascularization (HCC); Serous detachment of retinal pigment epithelium, left; Intermediate stage nonexudative age-related macular degeneration of right eye; Posterior capsular opacification, right eye; History of retinal detachment;  Pacemaker; Hx of myocardial infarction; Long term current use of insulin  (HCC); Mass of right side of neck; 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   He  has a past medical history of Allergy (1997), Anemia (2024), Benign prostatic hypertrophy, CAD (coronary artery disease), Cataract (2022), DJD (degenerative joint disease), GERD (gastroesophageal reflux disease) (2000), Glomerulonephritis acute, Gout, Hepatitis A (1950), Hepatitis B (1980's), History of kidney stones, HOH (hard of hearing), Hyperlipidemia (2015), Hypertension (1995), Hypothyroidism, Incontinence of urine, Legally blind in right eye, as defined in [place] , Personal history of other infectious and parasitic disease, Sleep apnea (1990's), Squamous cell carcinoma, Sun-damaged skin, Tuberculosis (1961), and Type II diabetes mellitus (HCC).Jonathan [name]   He presents with chief complaint of Establish Care (No concern for today //HM due- foot exam and A1c ) .   Discussed the use of AI scribe software for clinical note transcription with the patient, who gave verbal consent to proceed.  History of Present Illness Jonathan [name] "Jonathan [name]" is an 88 year old male with cardiovascular disease, diabetes, and prostate issues who presents for a routine follow-up.  He has a history of cardiovascular disease, including a quintuple coronary artery bypass graft (CABG) performed 20 years ago due to a 90% blockage in the LAD. A stent was placed three years ago for one of the branches. No chest pain or shortness of breath. A pacemaker was implanted a year ago due to bradycardia with heart  rates in the 40s and 50s, associated with first and second-degree heart block. His current heart rate is maintained around 72 bpm with the pacemaker.  He has a long-standing history of diabetes, diagnosed in 1994, and has been on oral hypoglycemics and insulin . He uses glipizide  5 mg twice daily, metformin  twice daily, pioglitazone  45 mg, and sliding scale insulin  glargine, administering 7-10 units twice a week. His blood sugar levels are generally well-controlled, with occasional readings of 170 mg/dL, which he treats with 8 units of insulin . His A1c was 7.4% in December, the highest it has been, but it has been as low as 6.2%. He experiences neuropathy with numbness and occasional burning in his legs, and has had a toe removed due to an ingrown toenail that wouldn't heal.  He has a history of gout, managed with allopurinol , and reports no recent flares. He takes 81 mg aspirin  daily and benazepril -hydrochlorothiazide  20/25 mg for blood pressure, which is typically around 100/70 mmHg at home. He has a history of low blood pressure and had a pacemaker placed due to low pulse rates.  He has prostate issues, including benign prostatic hyperplasia (BPH), and uses tamsulosin  and oxybutynin. He reports improved nocturia with tamsulosin , now urinating once every six hours at night. He uses towels during the day to manage urinary incontinence.  He has a history of constipation and was previously impacted, requiring ER intervention. He uses probiotics and occasionally Miralax  if he hasn't had a bowel movement in three days. His bowel habits are stable with this regimen.  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 Recent scans show no evidence of recurrence. He also has mild anemia and vitamin B12 deficiency, which have been managed with iron and B12 supplements.     ROS  Past Surgical History:  Procedure  Laterality Date   CARDIAC CATHETERIZATION     CATARACT EXTRACTION W/ INTRAOCULAR LENS  IMPLANT,  BILATERAL Bilateral    CORONARY ARTERY BYPASS GRAFT  01/23/2004   Dr Sherene Dilling   CORONARY STENT INTERVENTION  07/17/2019   CORONARY STENT INTERVENTION N/A 07/17/2019   Procedure: CORONARY STENT INTERVENTION;  Surgeon: Swaziland, Peter M, MD;  Location: [facility] INVASIVE CV LAB;  Service: Cardiovascular;  Laterality: N/A;   CYSTOSCOPY WITH INSERTION OF UROLIFT N/A 06/23/2017   Procedure: CYSTOSCOPY WITH INSERTION OF UROLIFT;  Surgeon: Trent Frizzle, MD;  Location: [facility];  Service: Urology;  Laterality: N/A;   EYE SURGERY Right 2003   detached retina ,blind in right eye   INGUINAL HERNIA REPAIR Bilateral 1980's - 1990's   JOINT REPLACEMENT  2015   Bilat knee   LEFT HEART CATH AND CORONARY ANGIOGRAPHY N/A 07/17/2019   Procedure: LEFT HEART CATH AND CORONARY ANGIOGRAPHY;  Surgeon: Swaziland, Peter M, MD;  Location: [facility] INVASIVE CV LAB;  Service: Cardiovascular;  Laterality: N/A;   MASTOIDECTOMY Right 1943   PACEMAKER IMPLANT N/A 09/04/2021   Procedure: PACEMAKER IMPLANT;  Surgeon: Jolly Needle, MD;  Location: [facility] INVASIVE CV LAB;  Service: Cardiovascular;  Laterality: N/A;   QUADRICEPS REPAIR Right 2009   RETINAL DETACHMENT SURGERY Right X 2   SEPSIS  1943   TONSILLECTOMY  ~ 1943   TOTAL KNEE ARTHROPLASTY Left 09/18/2013   Procedure: TOTAL KNEE ARTHROPLASTY;  Surgeon: Alphonzo Ask, MD;  Location: [facility] OR;  Service: Orthopedics;  Laterality: Left;   TOTAL KNEE ARTHROPLASTY Right 08/22/2014   Procedure: RIGHT TOTAL KNEE ARTHROPLASTY;  Surgeon: Alphonzo Ask, MD;  Location: [facility] OR;  Service: Orthopedics;  Laterality: Right;   UMBILICAL HERNIA REPAIR     UVULOPALATOPLASTY  1990's   ENT MD DR Karlynn Oyster    Outpatient Medications Prior to Visit  Medication Sig Dispense Refill   allopurinol  (ZYLOPRIM ) 300 MG tablet Take 1 tablet (300 mg total) by mouth daily. 90 tablet 4   aspirin  EC 81  MG tablet Take 81 mg by mouth daily.     benazepril -hydrochlorthiazide (LOTENSIN  HCT) 20-25 MG tablet Take 1 tablet by mouth daily. 90 tablet 3   cetirizine (ZYRTEC) 10 MG tablet Take 10 mg by mouth daily as needed for allergies.     cyanocobalamin  (VITAMIN B12) 1000 MCG tablet Take 1,000 mcg by mouth daily.     ferrous sulfate  325 (65 FE) MG EC tablet Take 1 tablet (325 mg total) by mouth in the morning and at bedtime. 180 tablet 3   furosemide  (LASIX ) 20 MG tablet Take 1 tablet (20 mg total) by mouth daily as needed for fluid or edema. 100 tablet 3   glipiZIDE  (GLUCOTROL ) 5 MG tablet Take 1 tablet (5 mg total) by mouth 2 (two) times daily. 180 tablet 4   glucose blood (FREESTYLE LITE) test strip USE TWICE A DAY AS DIRECTED (E11.9) 200 each 1   insulin  glargine (LANTUS ) 100 UNIT/ML injection Inject 5-50 Units into the skin 2 (two) times daily. Per sliding scale     levothyroxine  (SYNTHROID , LEVOTHROID) 100 MCG tablet Take 1 tablet (100 mcg total) by mouth every morning. 90 tablet 4   metFORMIN  (GLUCOPHAGE ) 1000 MG tablet Take 1 tablet (1,000 mg total) by mouth 2 (two) times daily with a meal. 200 tablet 4   metoprolol  succinate (TOPROL  XL) 25 MG 24 hr tablet Take 0.5 tablets (12.5 mg total) by mouth daily. Take after meal, at NIGHT. (Patient taking differently: Take 25 mg by mouth daily. Take after meal, at NIGHT.) 60 tablet 2   Multiple Vitamin (MULTIVITAMIN  PO) Take 1 tablet by mouth daily.       mupirocin ointment (BACTROBAN) 2 % Apply topically.     nitroGLYCERIN  (NITROSTAT ) 0.4 MG SL tablet Place 1 tablet (0.4 mg total) under the tongue every 5 (five) minutes x 3 doses as needed for chest pain. 25 tablet 2   Omega-3 Fatty Acids (FISH OIL) 1000 MG CAPS Take 1,000 mg by mouth daily.      omeprazole  (PRILOSEC) 20 MG capsule Take 1 capsule (20 mg total) by mouth daily as needed (heartburn or acid reflux). 90 capsule 4   oxybutynin (DITROPAN XL) 15 MG 24 hr tablet Take 15 mg by mouth daily.      oxymetazoline (AFRIN) 0.05 % nasal spray Place 1 spray into both nostrils 2 (two) times daily as needed for congestion.     pioglitazone  (ACTOS ) 45 MG tablet Take 1 tablet (45 mg total) by mouth daily. 90 tablet 3   polycarbophil (FIBERCON) 625 MG tablet Take 1,250 mg by mouth 2 (two) times daily.     polyethylene glycol (MIRALAX  / GLYCOLAX ) packet Take 17 g by mouth daily as needed for moderate constipation.     Probiotic Product (ALIGN) 4 MG CAPS Take 1 capsule (4 mg total) by mouth daily. 90 capsule 3   simvastatin  (ZOCOR ) 40 MG tablet Take 1 tablet (40 mg total) by mouth at bedtime. 90 tablet 3   tamsulosin  (FLOMAX ) 0.4 MG CAPS capsule Take 0.4 mg by mouth daily.     docusate sodium  (COLACE) 100 MG capsule Take 100 mg by mouth daily. (Patient not taking: Reported on 01/03/2024)     No facility-administered medications prior to visit.    Family History  Problem Relation Age of Onset   Diabetes Other        1st degree relative   Hypertension Other        Family History   Cancer Mother    Cancer Sister    Early death Sister     Social History   Socioeconomic History   Marital status: Married    Spouse name: Not on file   Number of children: Not on file   Years of education: Not on file   Highest education level: Professional school degree (e.g., MD, DDS, DVM, JD)  Occupational History   Occupation: retired  Tobacco Use   Smoking status: Former    Current packs/day: 0.00    Average packs/day: 1 pack/day for 10.0 years (10.0 ttl pk-yrs)    Types: Cigarettes    Start date: 09/10/1955    Quit date: 09/09/1965    Years since quitting: 58.3   Smokeless tobacco: Never  Vaping Use   Vaping status: Never Used  Substance and Sexual Activity   Alcohol use: Yes    Alcohol/week: 7.0 standard drinks of alcohol    Types: 2 Glasses of wine, 5 Standard drinks or equivalent per week    Comment: Glass of wine on social occasion or out to ear   Drug use: No   Sexual activity: Not Currently   Other Topics Concern   Not on file  Social History Narrative   Pt lives in [place].  Retired Designer, industrial/product (retired 2005)   Social Drivers of Corporate investment banker Strain: Low Risk  (01/03/2024)   Overall Financial Resource Strain (CARDIA)    Difficulty of Paying Living Expenses: Not hard at all  Food Insecurity: No Food Insecurity (01/03/2024)   Hunger Vital Sign    Worried About Running Out of  Food in the Last Year: Never true    Ran Out of Food in the Last Year: Never true  Transportation Needs: No Transportation Needs (01/03/2024)   PRAPARE - Administrator, Civil Service (Medical): No    Lack of Transportation (Non-Medical): No  Physical Activity: Insufficiently Active (01/03/2024)   Exercise Vital Sign    Days of Exercise per Week: 2 days    Minutes of Exercise per Session: 10 min  Stress: No Stress Concern Present (01/03/2024)   [employer] of Occupational Health - Occupational Stress Questionnaire    Feeling of Stress : Not at all  Social Connections: Socially Integrated (01/03/2024)   Social Connection and Isolation Panel [NHANES]    Frequency of Communication with Friends and Family: Once a week    Frequency of Social Gatherings with Friends and Family: Twice a week    Attends Religious Services: 1 to 4 times per year    Active Member of [employer] or Organizations: Yes    Attends Banker Meetings: 1 to 4 times per year    Marital Status: Married  Catering manager Violence: Not on file                                                                                                  Objective:  Physical Exam: BP (!) 116/56 (BP Location: Left Arm, Patient Position: Sitting, Cuff Size: Large)   Pulse 73   Temp 97.6 F (36.4 C) (Temporal)   Resp 18   Ht 6' 2.5" (1.892 m)   Wt 219 lb 6.4 oz (99.5 kg)   SpO2 95%   BMI 27.79 kg/m    Physical Exam VITALS: P- 72, BP- 116/56 GENERAL: Alert, cooperative, well developed, no acute  distress HEENT: Normocephalic, well-healed excision of right lower cheek from prior surgery CHEST: Clear to auscultation bilaterally, no wheezes, rhonchi, or crackles CARDIOVASCULAR: Normal heart rate and rhythm, S1 and S2 normal without murmurs ABDOMEN: Soft, non-tender, non-distended, without organomegaly, normal bowel sounds EXTREMITIES: No cyanosis or edema NEUROLOGICAL: Cranial nerves grossly intact, moves all extremities without gross motor or sensory deficit   Physical Exam  CUP PACEART REMOTE DEVICE CHECK Result Date: 12/13/2023 PPM Scheduled remote reviewed. Normal device function.  Presenting rhythm:  AS/VP Next remote 91 days. LA, CVRS   Recent Results (from the past 2160 hours)  CBC with Differential (Cancer Center Only)     Status: None   Collection Time: 10/27/23  9:55 AM  Result Value Ref Range   WBC Count 4.6 4.0 - 10.5 K/uL   RBC 4.35 4.22 - 5.81 MIL/uL   Hemoglobin 14.2 13.0 - 17.0 g/dL   HCT 32.3 55.7 - 32.2 %   MCV 93.6 80.0 - 100.0 fL   MCH 32.6 26.0 - 34.0 pg   MCHC 34.9 30.0 - 36.0 g/dL   RDW 02.5 42.7 - 06.2 %   Platelet Count 184 150 - 400 K/uL   nRBC 0.0 0.0 - 0.2 %   Neutrophils Relative % 61 %   Neutro Abs 2.8 1.7 - 7.7 K/uL   Lymphocytes Relative  27 %   Lymphs Abs 1.3 0.7 - 4.0 K/uL   Monocytes Relative 11 %   Monocytes Absolute 0.5 0.1 - 1.0 K/uL   Eosinophils Relative 1 %   Eosinophils Absolute 0.1 0.0 - 0.5 K/uL   Basophils Relative 0 %   Basophils Absolute 0.0 0.0 - 0.1 K/uL   Immature Granulocytes 0 %   Abs Immature Granulocytes 0.02 0.00 - 0.07 K/uL    Comment: Performed at [facility], [address] [place], [place] [address]  Ferritin     Status: None   Collection Time: 10/27/23  9:55 AM  Result Value Ref Range   Ferritin 39 24 - 336 ng/mL    Comment: Performed at [facility], [address], [place], [place] [address]  Vitamin B12     Status: Abnormal   Collection Time: 10/27/23  9:55 AM   Result Value Ref Range   Vitamin B-12 156 (L) 180 - 914 pg/mL    Comment: (NOTE) This assay is not validated for testing neonatal or myeloproliferative syndrome specimens for Vitamin B12 levels. Performed at [facility], [address], [place], [place] [address]   HM DIABETES EYE EXAM     Status: None   Collection Time: 11/22/23  3:14 PM  Result Value Ref Range   HM Diabetic Eye Exam No Retinopathy No Retinopathy    Comment: ABS BY HIM  CUP PACEART REMOTE DEVICE CHECK     Status: None   Collection Time: 12/11/23 10:45 PM  Result Value Ref Range   Date Time Interrogation Session [id number]    Pulse Generator Manufacturer MERM    Pulse Gen Model W1DR01 Azure XT DR MRI    Pulse Gen Serial Number [id number]    Clinic Name [facility]    Implantable Pulse Generator Type Implantable Pulse Generator    Implantable Pulse Generator Implant Date 84132440    Implantable Lead Manufacturer [facility]    Implantable Lead Model 3830 SelectSecure    Implantable Lead Serial Number [id number]    Implantable Lead Implant Date 10272536    Implantable Lead Location Detail 1 UNKNOWN    Implantable Lead Location [id number]    Implantable Lead Connection Status [id number]    Implantable Lead Manufacturer MERM    Implantable Lead Model 5076 CapSureFix Novus    Implantable Lead Serial Number [id number]    Implantable Lead Implant Date 64403474    Implantable Lead Location Detail 1 UNKNOWN    Implantable Lead Location [id number]    Implantable Lead Connection Status [id number]    Lead Channel Setting Sensing Sensitivity 0.9 mV   Lead Channel Setting Pacing Amplitude 1.75 V   Lead Channel Setting Pacing Pulse Width 0.4 ms   Lead Channel Setting Pacing Amplitude 2.25 V   Zone Setting Status [id number]    Zone Setting Status [phone]    Lead Channel Impedance Value 456 ohm   Lead Channel Impedance Value 285 ohm   Lead Channel Sensing Intrinsic Amplitude 1.5 mV   Lead Channel Sensing Intrinsic Amplitude 1.5  mV   Lead Channel Pacing Threshold Amplitude 0.875 V   Lead Channel Pacing Threshold Pulse Width 0.4 ms   Lead Channel Impedance Value 646 ohm   Lead Channel Impedance Value 437 ohm   Lead Channel Sensing Intrinsic Amplitude 19.125 mV   Lead Channel Sensing Intrinsic Amplitude 19.125 mV   Lead Channel Pacing Threshold Amplitude 1 V   Lead Channel Pacing Threshold Pulse Width 0.4 ms   Battery Status OK  Battery Remaining Longevity 119 mo   Battery Voltage 3.01 V   Brady Statistic RA Percent Paced 44.02 %   Brady Statistic RV Percent Paced 99.43 %   Brady Statistic AP VP Percent 44.16 %   Brady Statistic AS VP Percent 55.28 %   Brady Statistic AP VS Percent 0.01 %   Brady Statistic AS VS Percent 0.56 %        Carnell Christian, MD, MS

## 2024-01-06 ENCOUNTER — Telehealth: Payer: Self-pay

## 2024-01-06 NOTE — Telephone Encounter (Signed)
 Copied from CRM [id number]. Topic: General - Other >> Jan 06, 2024  9:44 AM Danae Duncans wrote: Reason for CRM: [employer] would like for PT to be contacted to Schedule AWV.

## 2024-01-06 NOTE — Telephone Encounter (Signed)
 Forwarding message below

## 2024-01-09 NOTE — Telephone Encounter (Signed)
 Noted

## 2024-01-10 ENCOUNTER — Encounter: Payer: Self-pay | Admitting: Family Medicine

## 2024-01-10 LAB — HM DIABETES EYE EXAM

## 2024-01-27 ENCOUNTER — Encounter: Payer: Self-pay | Admitting: Family Medicine

## 2024-01-27 ENCOUNTER — Inpatient Hospital Stay: Attending: Oncology

## 2024-01-27 DIAGNOSIS — E538 Deficiency of other specified B group vitamins: Secondary | ICD-10-CM | POA: Diagnosis present

## 2024-01-27 DIAGNOSIS — Z85828 Personal history of other malignant neoplasm of skin: Secondary | ICD-10-CM | POA: Insufficient documentation

## 2024-01-27 DIAGNOSIS — D649 Anemia, unspecified: Secondary | ICD-10-CM | POA: Insufficient documentation

## 2024-01-27 LAB — VITAMIN B12: Vitamin B-12: 158 pg/mL — ABNORMAL LOW (ref 180–914)

## 2024-01-27 LAB — FERRITIN: Ferritin: 59 ng/mL (ref 24–336)

## 2024-01-28 ENCOUNTER — Ambulatory Visit: Payer: Self-pay | Admitting: Oncology

## 2024-01-30 ENCOUNTER — Other Ambulatory Visit: Payer: Self-pay | Admitting: Family Medicine

## 2024-01-30 NOTE — Progress Notes (Signed)
 Remote pacemaker transmission.

## 2024-01-30 NOTE — Addendum Note (Signed)
 Addended by: TAWNI DRILLING D on: 01/30/2024 01:15 PM   Modules accepted: Orders

## 2024-01-31 NOTE — Telephone Encounter (Signed)
 LVM for Mr. Wessells to check Mychart message and respond.

## 2024-02-20 ENCOUNTER — Other Ambulatory Visit: Payer: Self-pay | Admitting: Family Medicine

## 2024-02-20 MED ORDER — BENAZEPRIL-HYDROCHLOROTHIAZIDE 20-25 MG PO TABS: 1.0000 | ORAL_TABLET | Freq: Every day | ORAL | 3 refills | Status: AC

## 2024-02-20 MED ORDER — FREESTYLE LITE TEST VI STRP
ORAL_STRIP | 1 refills | Status: AC
Start: 2024-02-20 — End: ?

## 2024-02-20 NOTE — Telephone Encounter (Signed)
 Copied from CRM [id number]. Topic: Clinical - Medication Refill >> Feb 20, 2024  9:25 AM [place] A wrote: Medication: benazepril -hydrochlorthiazide (LOTENSIN  HCT) 20-25 MG tablet ; glucose blood (FREESTYLE LITE) test strip  Has the patient contacted their pharmacy? Yes (Agent: If no, request that the patient contact the pharmacy for the refill. If patient does not wish to contact the pharmacy document the reason why and proceed with request.) (Agent: If yes, when and what did the pharmacy advise?) Office has not responded to request  This is the patient's preferred pharmacy:  [facility] [place], [place] - [address] [place] [address] Phone: [phone] Fax: [phone]   Is this the correct pharmacy for this prescription? Yes If no, delete pharmacy and type the correct one.   Has the prescription been filled recently? No  Is the patient out of the medication? Yes  Has the patient been seen for an appointment in the last year OR does the patient have an upcoming appointment? Yes  Can we respond through MyChart? Yes  Agent: Please be advised that Rx refills may take up to 3 business days. We ask that you follow-up with your pharmacy.

## 2024-02-28 ENCOUNTER — Encounter: Payer: Self-pay | Admitting: Family Medicine

## 2024-02-28 LAB — HM DIABETES EYE EXAM

## 2024-02-29 ENCOUNTER — Encounter: Payer: Self-pay | Admitting: Family Medicine

## 2024-03-01 ENCOUNTER — Other Ambulatory Visit: Payer: Self-pay | Admitting: Family Medicine

## 2024-03-12 ENCOUNTER — Ambulatory Visit: Payer: Medicare Other

## 2024-03-12 DIAGNOSIS — I440 Atrioventricular block, first degree: Secondary | ICD-10-CM

## 2024-03-14 ENCOUNTER — Ambulatory Visit: Payer: Self-pay | Admitting: Cardiology

## 2024-03-14 LAB — CUP PACEART REMOTE DEVICE CHECK
Battery Remaining Longevity: 122 mo
Battery Voltage: 3.01 V
Brady Statistic AP VP Percent: 43.55 %
Brady Statistic AP VS Percent: 0.01 %
Brady Statistic AS VP Percent: 55.84 %
Brady Statistic AS VS Percent: 0.6 %
Brady Statistic RA Percent Paced: 43.41 %
Brady Statistic RV Percent Paced: 99.39 %
Date Time Interrogation Session: 20250805141429
Implantable Lead Connection Status: 753985
Implantable Lead Connection Status: 753985
Implantable Lead Implant Date: 20230127
Implantable Lead Implant Date: 20230127
Implantable Lead Location: 753859
Implantable Lead Location: 753860
Implantable Lead Model: 3830
Implantable Lead Model: 5076
Implantable Pulse Generator Implant Date: 20230127
Lead Channel Impedance Value: 285 Ohm
Lead Channel Impedance Value: 399 Ohm
Lead Channel Impedance Value: 437 Ohm
Lead Channel Impedance Value: 551 Ohm
Lead Channel Pacing Threshold Amplitude: 0.75 V
Lead Channel Pacing Threshold Amplitude: 1 V
Lead Channel Pacing Threshold Pulse Width: 0.4 ms
Lead Channel Pacing Threshold Pulse Width: 0.4 ms
Lead Channel Sensing Intrinsic Amplitude: 1.5 mV
Lead Channel Sensing Intrinsic Amplitude: 1.5 mV
Lead Channel Sensing Intrinsic Amplitude: 15.75 mV
Lead Channel Sensing Intrinsic Amplitude: 15.75 mV
Lead Channel Setting Pacing Amplitude: 1.5 V
Lead Channel Setting Pacing Amplitude: 2 V
Lead Channel Setting Pacing Pulse Width: 0.4 ms
Lead Channel Setting Sensing Sensitivity: 0.9 mV
Zone Setting Status: 755011
Zone Setting Status: 755011

## 2024-03-22 ENCOUNTER — Other Ambulatory Visit: Payer: Self-pay | Admitting: Family Medicine

## 2024-03-26 ENCOUNTER — Other Ambulatory Visit: Payer: Self-pay | Admitting: Family Medicine

## 2024-04-30 ENCOUNTER — Inpatient Hospital Stay (HOSPITAL_BASED_OUTPATIENT_CLINIC_OR_DEPARTMENT_OTHER): Admitting: Oncology

## 2024-04-30 ENCOUNTER — Inpatient Hospital Stay

## 2024-04-30 ENCOUNTER — Inpatient Hospital Stay: Attending: Oncology

## 2024-04-30 VITALS — BP 111/60 | HR 93 | Temp 97.9°F | Resp 18 | Ht 74.5 in | Wt 226.1 lb

## 2024-04-30 DIAGNOSIS — I2510 Atherosclerotic heart disease of native coronary artery without angina pectoris: Secondary | ICD-10-CM | POA: Diagnosis not present

## 2024-04-30 DIAGNOSIS — Z955 Presence of coronary angioplasty implant and graft: Secondary | ICD-10-CM | POA: Insufficient documentation

## 2024-04-30 DIAGNOSIS — R32 Unspecified urinary incontinence: Secondary | ICD-10-CM | POA: Diagnosis not present

## 2024-04-30 DIAGNOSIS — K130 Diseases of lips: Secondary | ICD-10-CM | POA: Insufficient documentation

## 2024-04-30 DIAGNOSIS — Z86006 Personal history of melanoma in-situ: Secondary | ICD-10-CM | POA: Insufficient documentation

## 2024-04-30 DIAGNOSIS — D649 Anemia, unspecified: Secondary | ICD-10-CM

## 2024-04-30 DIAGNOSIS — Z96653 Presence of artificial knee joint, bilateral: Secondary | ICD-10-CM | POA: Insufficient documentation

## 2024-04-30 DIAGNOSIS — Z85819 Personal history of malignant neoplasm of unspecified site of lip, oral cavity, and pharynx: Secondary | ICD-10-CM | POA: Insufficient documentation

## 2024-04-30 DIAGNOSIS — E119 Type 2 diabetes mellitus without complications: Secondary | ICD-10-CM | POA: Insufficient documentation

## 2024-04-30 DIAGNOSIS — Z951 Presence of aortocoronary bypass graft: Secondary | ICD-10-CM | POA: Diagnosis not present

## 2024-04-30 DIAGNOSIS — Z923 Personal history of irradiation: Secondary | ICD-10-CM | POA: Insufficient documentation

## 2024-04-30 DIAGNOSIS — Z85820 Personal history of malignant melanoma of skin: Secondary | ICD-10-CM | POA: Diagnosis not present

## 2024-04-30 DIAGNOSIS — Z08 Encounter for follow-up examination after completed treatment for malignant neoplasm: Secondary | ICD-10-CM | POA: Insufficient documentation

## 2024-04-30 LAB — VITAMIN B12: Vitamin B-12: 183 pg/mL (ref 180–914)

## 2024-04-30 LAB — CBC WITH DIFFERENTIAL (CANCER CENTER ONLY)
Abs Immature Granulocytes: 0.01 K/uL (ref 0.00–0.07)
Basophils Absolute: 0 K/uL (ref 0.0–0.1)
Basophils Relative: 0 %
Eosinophils Absolute: 0 K/uL (ref 0.0–0.5)
Eosinophils Relative: 1 %
HCT: 39.7 % (ref 39.0–52.0)
Hemoglobin: 13.5 g/dL (ref 13.0–17.0)
Immature Granulocytes: 0 %
Lymphocytes Relative: 22 %
Lymphs Abs: 1.1 K/uL (ref 0.7–4.0)
MCH: 31.7 pg (ref 26.0–34.0)
MCHC: 34 g/dL (ref 30.0–36.0)
MCV: 93.2 fL (ref 80.0–100.0)
Monocytes Absolute: 0.5 K/uL (ref 0.1–1.0)
Monocytes Relative: 10 %
Neutro Abs: 3.4 K/uL (ref 1.7–7.7)
Neutrophils Relative %: 67 %
Platelet Count: 194 K/uL (ref 150–400)
RBC: 4.26 MIL/uL (ref 4.22–5.81)
RDW: 14.2 % (ref 11.5–15.5)
WBC Count: 5 K/uL (ref 4.0–10.5)
nRBC: 0 % (ref 0.0–0.2)

## 2024-04-30 LAB — FERRITIN: Ferritin: 68 ng/mL (ref 24–336)

## 2024-04-30 NOTE — Progress Notes (Signed)
 Jonathan [name]   Diagnosis: Squamous cell carcinoma  INTERVAL HISTORY:   Jonathan [name] returns as scheduled.  Jonathan [name] feels well.  Jonathan [name] is exercising.  Jonathan [name] has a small lesion at the lower lip.  Jonathan [name] is scheduled to see Jonathan [name] next month.  No other complaint.  Objective:  Vital signs in last 24 hours:  Blood pressure 111/60, pulse 93, temperature 97.9 F (36.6 C), temperature source Temporal, resp. rate 18, height 6' 2.5 (1.892 m), weight 226 lb 1.6 oz (102.6 kg), SpO2 98%.    HEENT: Oropharynx without visible mass, neck without mass Lymphatics: No cervical, supraclavicular, axillary, or inguinal nodes Resp: Lungs clear bilaterally Cardio: Regular rhythm with premature beats GI: No hepatosplenomegaly Vascular: No leg edema  Skin: Half centimeter raised round lesion at the mid lower outer lip   Lab Results:  Lab Results  Component Value Date   WBC 5.0 04/30/2024   HGB 13.5 04/30/2024   HCT 39.7 04/30/2024   MCV 93.2 04/30/2024   PLT 194 04/30/2024   NEUTROABS 3.4 04/30/2024    CMP  Lab Results  Component Value Date   NA 139 06/27/2023   K 4.4 06/27/2023   CL 103 06/27/2023   CO2 26 06/27/2023   GLUCOSE 168 (H) 06/27/2023   BUN 17 06/27/2023   CREATININE 0.98 06/27/2023   CALCIUM 9.9 06/27/2023   PROT 6.7 03/15/2023   ALBUMIN 4.3 03/15/2023   AST 19 03/15/2023   ALT 10 03/15/2023   ALKPHOS 45 03/15/2023   BILITOT 0.6 03/15/2023   GFRNONAA >60 06/27/2023   GFRAA >60 07/18/2019    No results found for: CEA1, CEA, CAN199, CA125  Lab Results  Component Value Date   INR 1.07 08/14/2014   LABPROT 14.0 08/14/2014    Imaging:  No results found.  Medications: I have reviewed the patient's current medications.   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  Negative resection margins, favored to represent a metastasis from a skin or oral primary Adjuvant radiation, 6000 cGy in 30 fractions, 01/16/2021 - 02/26/2021 MRI neck 05/04/2021-postsurgical/treatment changes, no definite evidence of residual tumor, no lymphadenopathy PET 05/06/2021-interval resection of right submandibular tumor, no recurrent carcinoma CTs Neck and CAP 07/04/2023: Postsurgical changes in the right submandibular area, new subcapsular collection in the lateral spleen-1 cm thickness-likely a small subcapsular hematoma, no evidence of metastatic disease History of melanoma in situ of the left lower leg 08/05/2017 Squamous cell carcinoma of the anterior chest September 2022, wide excision November 2022 Squamous cell carcinoma in situ left lip September 2022 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 07/19/2016 Diabetes History of detached retina Coronary artery disease, coronary artery bypass surgery 2005, coronary stent 2020 Urinary incontinence Bilateral knee replacement Squamous cell carcinoma of the right lower lip February 2023 Mild normocytic anemia 2024 Low ferritin August 2024 Low vitamin B12 03/15/2023 Stool Hemoccults negative August 2024       Disposition: Jonathan [name] from squamous cell carcinoma.  The lesion at the lower lip could represent a new primary skin cancer.  Jonathan [name] is scheduled to see Jonathan [name] next month.  Jonathan [name] has a history of iron and vitamin B12 deficiencies.  Jonathan [name] is taking iron and vitamin B12 each several days per week.  We will follow-up on the B12  and ferritin levels from today.  Jonathan [name] for an office and lab visit in 6 months.  Arley Hof, MD  04/30/2024  2:55 PM

## 2024-05-01 ENCOUNTER — Ambulatory Visit: Payer: Self-pay | Admitting: Oncology

## 2024-05-01 NOTE — Progress Notes (Signed)
 LVM for him to call back for results

## 2024-05-02 NOTE — Telephone Encounter (Signed)
 Patient gave verbal understanding and had no further questions or concerns

## 2024-05-02 NOTE — Telephone Encounter (Signed)
-----   Message from Arley Hof sent at 05/01/2024  2:37 PM EDT ----- Please call patient, the vitamin B12 level is at the low end of the normal range, continue vitamin B12, increase to daily dosing check vitamin B12 level next lab  ----- Message ----- From: Rebecka, [facility] [place] Sent: 04/30/2024   2:44 PM EDT To: Arley KATHEE Hof, MD

## 2024-05-07 NOTE — Progress Notes (Signed)
 Remote PPM Transmission

## 2024-06-11 ENCOUNTER — Ambulatory Visit: Payer: Medicare Other

## 2024-06-11 DIAGNOSIS — I440 Atrioventricular block, first degree: Secondary | ICD-10-CM | POA: Diagnosis not present

## 2024-06-12 ENCOUNTER — Ambulatory Visit: Payer: Self-pay | Admitting: Cardiology

## 2024-06-12 LAB — CUP PACEART REMOTE DEVICE CHECK
Battery Remaining Longevity: 120 mo
Battery Voltage: 3.01 V
Brady Statistic AP VP Percent: 50.11 %
Brady Statistic AP VS Percent: 0.01 %
Brady Statistic AS VP Percent: 49.67 %
Brady Statistic AS VS Percent: 0.21 %
Brady Statistic RA Percent Paced: 49.78 %
Brady Statistic RV Percent Paced: 99.78 %
Date Time Interrogation Session: 20251102211218
Implantable Lead Connection Status: 753985
Implantable Lead Connection Status: 753985
Implantable Lead Implant Date: 20230127
Implantable Lead Implant Date: 20230127
Implantable Lead Location: 753859
Implantable Lead Location: 753860
Implantable Lead Model: 3830
Implantable Lead Model: 5076
Implantable Pulse Generator Implant Date: 20230127
Lead Channel Impedance Value: 285 Ohm
Lead Channel Impedance Value: 418 Ohm
Lead Channel Impedance Value: 456 Ohm
Lead Channel Impedance Value: 570 Ohm
Lead Channel Pacing Threshold Amplitude: 0.75 V
Lead Channel Pacing Threshold Amplitude: 1 V
Lead Channel Pacing Threshold Pulse Width: 0.4 ms
Lead Channel Pacing Threshold Pulse Width: 0.4 ms
Lead Channel Sensing Intrinsic Amplitude: 1.625 mV
Lead Channel Sensing Intrinsic Amplitude: 1.625 mV
Lead Channel Sensing Intrinsic Amplitude: 16.125 mV
Lead Channel Sensing Intrinsic Amplitude: 16.125 mV
Lead Channel Setting Pacing Amplitude: 1.5 V
Lead Channel Setting Pacing Amplitude: 2 V
Lead Channel Setting Pacing Pulse Width: 0.4 ms
Lead Channel Setting Sensing Sensitivity: 0.9 mV
Zone Setting Status: 755011
Zone Setting Status: 755011

## 2024-06-14 NOTE — Progress Notes (Signed)
 Remote PPM Transmission

## 2024-06-22 ENCOUNTER — Ambulatory Visit

## 2024-06-22 VITALS — BP 110/58 | HR 64 | Temp 97.8°F | Ht 73.5 in | Wt 224.8 lb

## 2024-06-22 DIAGNOSIS — Z0000 Encounter for general adult medical examination without abnormal findings: Secondary | ICD-10-CM

## 2024-06-22 NOTE — Progress Notes (Signed)
 Chief Complaint  Patient presents with   Medicare Wellness     Subjective:   Jonathan [name] is a 88 y.o. male who presents for a Medicare Annual Wellness Visit.  Allergies (verified) Bee venom   History: Past Medical History:  Diagnosis Date   Allergy 1997   Anemia 2024   Benign prostatic hypertrophy    CAD (coronary artery disease)    CABG 2005 by Dr Lucas   Cataract 2022   DJD (degenerative joint disease)    bilateral knee, gout   GERD (gastroesophageal reflux disease) 2000   HX OF   Glomerulonephritis acute    at age 22, due to beta strep, resolved, renal calculi- passed spontaneously   Gout    Hepatitis A 1950   Hepatitis B 1980's   History of kidney stones    3-4 TIMES SINCE 1970 PASSED ON OWN   HOH (hard of hearing)    wears hearing aids   Hyperlipidemia 2015   Hypertension 1995   Hypothyroidism    Incontinence of urine    Legally blind in right eye, as defined in [place]     Personal history of other infectious and parasitic disease    Sleep apnea 1990's   before uvulectomy   Squamous cell carcinoma    lots on my arms & face (09/21/2013)   Sun-damaged skin    Tuberculosis 1961   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 IN MED SCHOOL IN 1961[place] HAD TB   Type II diabetes mellitus (HCC)    Past Surgical History:  Procedure Laterality Date   CARDIAC CATHETERIZATION     CATARACT EXTRACTION W/ INTRAOCULAR LENS  IMPLANT, BILATERAL Bilateral    CORONARY ARTERY BYPASS GRAFT  01/23/2004   Dr Lucas   CORONARY STENT INTERVENTION  07/17/2019   CORONARY STENT INTERVENTION N/A 07/17/2019   Procedure: CORONARY STENT INTERVENTION;  Surgeon: Jordan, Peter M, MD;  Location: [facility];  Service: Cardiovascular;  Laterality: N/A;   CYSTOSCOPY WITH INSERTION OF UROLIFT N/A 06/23/2017   Procedure: CYSTOSCOPY WITH INSERTION OF UROLIFT;  Surgeon: Matilda Senior, MD;  [facility];  Service: Urology;  Laterality: N/A;   EYE SURGERY Right 2003    detached retina ,blind in right eye   INGUINAL HERNIA REPAIR Bilateral 1980's - 1990's   JOINT REPLACEMENT  2015   Bilat knee   LEFT HEART CATH AND CORONARY ANGIOGRAPHY N/A 07/17/2019   Procedure: LEFT HEART CATH AND CORONARY ANGIOGRAPHY;  Surgeon: Jordan, Peter M, MD;  Location: [facility] INVASIVE CV LAB;  Service: Cardiovascular;  Laterality: N/A;   MASTOIDECTOMY Right 1943   PACEMAKER IMPLANT N/A 09/04/2021   Procedure: PACEMAKER IMPLANT;  Surgeon: Kelsie Agent, MD;  Location: [facility];  Service: Cardiovascular;  Laterality: N/A;   QUADRICEPS REPAIR Right 2009   RETINAL DETACHMENT SURGERY Right X 2   SEPSIS  1943   TONSILLECTOMY  ~ 1943   TOTAL KNEE ARTHROPLASTY Left 09/18/2013   Procedure: TOTAL KNEE ARTHROPLASTY;  Surgeon: Maude KANDICE Herald, MD;  Location: [facility] OR;  Service: Orthopedics;  Laterality: Left;   TOTAL KNEE ARTHROPLASTY Right 08/22/2014   Procedure: RIGHT TOTAL KNEE ARTHROPLASTY;  Surgeon: Maude KANDICE Herald, MD;  Location: [facility] OR;  Service: Orthopedics;  Laterality: Right;   UMBILICAL HERNIA REPAIR     UVULOPALATOPLASTY  1990's   ENT MD DR MALVINA   Family History  Problem Relation Age of Onset   Diabetes Other        1st  degree relative   Hypertension Other        Family History   Cancer Mother    Cancer Sister    Early death Sister    Social History   Occupational History   Occupation: retired  Tobacco Use   Smoking status: Former    Current packs/day: 0.00    Average packs/day: 1 pack/day for 10.0 years (10.0 ttl pk-yrs)    Types: Cigarettes    Start date: 09/10/1955    Quit date: 09/09/1965    Years since quitting: 58.8   Smokeless tobacco: Never  Vaping Use   Vaping status: Never Used  Substance and Sexual Activity   Alcohol use: Yes    Alcohol/week: 7.0 standard drinks of alcohol    Types: 2 Glasses of wine, 5 Standard drinks or equivalent per week    Comment: Glass of wine on social occasion or out to ear   Drug use: No   Sexual activity: Not  Currently   Tobacco Counseling Counseling given: Not Answered  SDOH Screenings   Food Insecurity: No Food Insecurity (06/20/2024)  Housing: Low Risk  (06/20/2024)  Transportation Needs: No Transportation Needs (06/20/2024)  Utilities: Not At Risk (06/22/2024)  Alcohol Screen: Low Risk  (06/20/2024)  Depression (PHQ2-9): Low Risk  (06/22/2024)  Financial Resource Strain: Low Risk  (06/20/2024)  Physical Activity: Insufficiently Active (06/20/2024)  Social Connections: Socially Integrated (06/20/2024)  Stress: No Stress Concern Present (06/20/2024)  Tobacco Use: Medium Risk (06/22/2024)  Health Literacy: Adequate Health Literacy (06/22/2024)   See flowsheets for full screening details  Depression Screen PHQ 2 & 9 Depression Scale- Over the past 2 weeks, how often have you been bothered by any of the following problems? Little interest or pleasure in doing things: 0 Feeling down, depressed, or hopeless (PHQ Adolescent also includes...irritable): 0 PHQ-2 Total Score: 0 Trouble falling or staying asleep, or sleeping too much: 0 Feeling tired or having little energy: 0 Poor appetite or overeating (PHQ Adolescent also includes...weight loss): 0 Feeling bad about yourself - or that you are a failure or have let yourself or your family down: 0 Trouble concentrating on things, such as reading the newspaper or watching television (PHQ Adolescent also includes...like school work): 0 Moving or speaking so slowly that other people could have noticed. Or the opposite - being so fidgety or restless that you have been moving around a lot more than usual: 0 Thoughts that you would be better off dead, or of hurting yourself in some way: 0 PHQ-9 Total Score: 0 If you checked off any problems, how difficult have these problems made it for you to do your work, take care of things at home, or get along with other people?: Not difficult at all  Depression Treatment Depression Interventions/Treatment :  EYV7-0 Score <4 Follow-up Not Indicated     Goals Addressed             This Visit's Progress    Patient Stated       06/22/2024, stay alive       Visit info / Clinical Intake: Medicare Wellness Visit Type:: Subsequent Annual Wellness Visit Persons participating in visit:: patient Medicare Wellness Visit Mode:: In-person (required for WTM) Information given by:: patient; family Interpreter Needed?: No Pre-visit prep was completed: yes AWV questionnaire completed by patient prior to visit?: yes Date:: 06/20/24 Living arrangements:: lives with spouse/significant other; in retirement community Patient's Overall Health Status Rating: good Typical amount of pain: none Does pain affect daily life?: no Are  you currently prescribed opioids?: no  Dietary Habits and Nutritional Risks How many meals a day?: 2 Eats fruit and vegetables daily?: yes Most meals are obtained by: preparing own meals; having others provide food In the last 2 weeks, have you had any of the following?: none Diabetic:: (!) yes Any non-healing wounds?: no How often do you check your BS?: 2 Would you like to be referred to a Nutritionist or for Diabetic Management? : no  Functional Status Activities of Daily Living (to include ambulation/medication): Independent Ambulation: Independent Medication Administration: Independent Home Management: Independent Manage your own finances?: yes Primary transportation is: driving Concerns about vision?: (!) yes (macular degeneration) Concerns about hearing?: (!) yes Uses hearing aids?: (!) yes Hear whispered voice?: yes  Fall Screening Falls in the past year?: 0 Number of falls in past year: 0 Was there an injury with Fall?: 0 Fall Risk Category Calculator: 0 Patient Fall Risk Level: Low Fall Risk  Fall Risk Patient at Risk for Falls Due to: Medication side effect; Impaired vision Fall risk Follow up: Falls prevention discussed; Falls evaluation  completed  Home and Transportation Safety: All rugs have non-skid backing?: N/A, no rugs All stairs or steps have railings?: N/A, no stairs Grab bars in the bathtub or shower?: yes Have non-skid surface in bathtub or shower?: yes Good home lighting?: yes Regular seat belt use?: yes Hospital stays in the last year:: no  Cognitive Assessment Difficulty concentrating, remembering, or making decisions? : no Will 6CIT or Mini Cog be Completed: yes What year is it?: 0 points What month is it?: 0 points Give patient an address phrase to remember (5 components): [address] [place], [place] About what time is it?: 0 points Count backwards from 20 to 1: 0 points Say the months of the year in reverse: 2 points Repeat the address phrase from earlier: 0 points 6 CIT Score: 2 points  Advance Directives (For Healthcare) Does Patient Have a Medical Advance Directive?: Yes Does patient want to make changes to medical advance directive?: No - Patient declined Type of Advance Directive: Healthcare Power of [place]; Living will Copy of Healthcare Power of Attorney in Chart?: No - copy requested Copy of Living Will in Chart?: No - copy requested  Reviewed/Updated  Reviewed/Updated: Reviewed All (Medical, Surgical, Family, Medications, Allergies, Care Teams, Patient Goals)        Objective:    Today's Vitals   06/22/24 1107  BP: (!) 110/58  Pulse: 64  Temp: 97.8 F (36.6 C)  TempSrc: Oral  SpO2: 96%  Weight: 224 lb 12.8 oz (102 kg)  Height: 6' 1.5 (1.867 m)   Body mass index is 29.26 kg/m.  Current Medications (verified) Outpatient Encounter Medications as of 06/22/2024  Medication Sig   allopurinol  (ZYLOPRIM ) 300 MG tablet Take 1 tablet (300 mg total) by mouth daily.   aspirin  EC 81 MG tablet Take 81 mg by mouth daily.   benazepril -hydrochlorthiazide (LOTENSIN  HCT) 20-25 MG tablet Take 1 tablet by mouth daily.   cetirizine (ZYRTEC) 10 MG tablet Take 10 mg by mouth daily as needed  for allergies.   ferrous sulfate  325 (65 FE) MG EC tablet Take 1 tablet (325 mg total) by mouth in the morning and at bedtime.   GEMTESA 75 MG TABS Take 1 tablet by mouth daily.   glipiZIDE  (GLUCOTROL ) 5 MG tablet TAKE ONE (1) TABLET BY MOUTH TWO (2) TIMES DAILY   glucose blood (FREESTYLE LITE) test strip USE TWICE A DAY AS DIRECTED (E11.9)  insulin  glargine (LANTUS ) 100 UNIT/ML injection Inject 5-50 Units into the skin 2 (two) times daily. Per sliding scale   levothyroxine  (SYNTHROID ) 100 MCG tablet TAKE ONE (1) TABLET BY MOUTH EVERY DAY   metFORMIN  (GLUCOPHAGE ) 1000 MG tablet TAKE ONE (1) TABLET BY MOUTH TWO (2) TIMES DAILY   metoprolol  succinate (TOPROL -XL) 25 MG 24 hr tablet TAKE ONE TABLET BY MOUTH EVERY EVENING AFTER MEAL   Multiple Vitamin (MULTIVITAMIN PO) Take 1 tablet by mouth daily.     Omega-3 Fatty Acids (FISH OIL) 1000 MG CAPS Take 1,000 mg by mouth daily.    omeprazole  (PRILOSEC) 20 MG capsule TAKE ONE CAPSULE BY MOUTH DAILY   pioglitazone  (ACTOS ) 45 MG tablet Take 1 tablet (45 mg total) by mouth daily.   polycarbophil (FIBERCON) 625 MG tablet Take 1,250 mg by mouth 2 (two) times daily.   Probiotic Product (ALIGN) 4 MG CAPS Take 1 capsule (4 mg total) by mouth daily.   simvastatin  (ZOCOR ) 40 MG tablet TAKE 1 TABLET BY MOUTH NIGHTLY AT BEDTIME   tamsulosin  (FLOMAX ) 0.4 MG CAPS capsule Take 0.4 mg by mouth daily.   cyanocobalamin  (VITAMIN B12) 1000 MCG tablet Take 1,000 mcg by mouth daily. (Patient not taking: Reported on 06/22/2024)   docusate sodium  (COLACE) 100 MG capsule Take 100 mg by mouth daily. (Patient not taking: Reported on 04/30/2024)   furosemide  (LASIX ) 20 MG tablet Take 1 tablet (20 mg total) by mouth daily as needed for fluid or edema. (Patient not taking: Reported on 06/22/2024)   mupirocin ointment (BACTROBAN) 2 % Apply topically. (Patient not taking: Reported on 04/30/2024)   nitroGLYCERIN  (NITROSTAT ) 0.4 MG SL tablet Place 1 tablet (0.4 mg total) under the tongue  every 5 (five) minutes x 3 doses as needed for chest pain. (Patient not taking: Reported on 06/22/2024)   oxybutynin (DITROPAN XL) 15 MG 24 hr tablet Take 15 mg by mouth daily. (Patient not taking: Reported on 06/22/2024)   oxymetazoline (AFRIN) 0.05 % nasal spray Place 1 spray into both nostrils 2 (two) times daily as needed for congestion. (Patient not taking: Reported on 06/22/2024)   polyethylene glycol (MIRALAX  / GLYCOLAX ) packet Take 17 g by mouth daily as needed for moderate constipation. (Patient not taking: Reported on 06/22/2024)   No facility-administered encounter medications on file as of 06/22/2024.   Hearing/Vision screen Hearing Screening - Comments:: Has hearing aids that are maintained Vision Screening - Comments:: Regular eye exams, Dr Elner Immunizations and Health Maintenance Health Maintenance  Topic Date Due   FOOT EXAM  12/18/2016   HEMOGLOBIN A1C  01/15/2020   COVID-19 Vaccine (6 - Moderna risk 2025-26 season) 10/15/2024   DTaP/Tdap/Td (3 - Tdap) 12/09/2024   OPHTHALMOLOGY EXAM  02/27/2025   Medicare Annual Wellness (AWV)  06/22/2025   Pneumococcal Vaccine: 50+ Years  Completed   Influenza Vaccine  Completed   Zoster Vaccines- Shingrix  Completed   Meningococcal B Vaccine  Aged Out        Assessment/Plan:  This is a routine wellness examination for Attilio.  Patient Care Team: Sebastian Beverley NOVAK, MD as PCP - General (Family Medicine) Cindie Ole DASEN, MD as PCP - Electrophysiology (Cardiology) Elner Arley LABOR, MD as Consulting Physician (Ophthalmology)  I have personally reviewed and noted the following in the patient's chart:   Medical and social history Use of alcohol, tobacco or illicit drugs  Current medications and supplements including opioid prescriptions. Functional ability and status Nutritional status Physical activity Advanced directives List of other physicians Hospitalizations,  surgeries, and ER visits in previous 12  months Vitals Screenings to include cognitive, depression, and falls Referrals and appointments  No orders of the defined types were placed in this encounter.  In addition, I have reviewed and discussed with patient certain preventive protocols, quality metrics, and best practice recommendations. A written personalized care plan for preventive services as well as general preventive health recommendations were provided to patient.   Ardella FORBES Dawn, LPN   [id number]/[id number]   Return in 1 year (on 06/22/2025).  After Visit Summary: (In Person-Printed) AVS printed and given to the patient  Nurse Notes: none

## 2024-06-22 NOTE — Patient Instructions (Signed)
 Jonathan [name],  Thank you for taking the time for your Medicare Wellness Visit. I appreciate your continued commitment to your health goals. Please review the care plan we discussed, and feel free to reach out if I can assist you further.  Please note that Annual Wellness Visits do not include a physical exam. Some assessments may be limited, especially if the visit was conducted virtually. If needed, we may recommend an in-person follow-up with your provider.  Ongoing Care Seeing your primary care provider every 3 to 6 months helps us  monitor your health and provide consistent, personalized care.   Referrals If a referral was made during today's visit and you haven't received any updates within two weeks, please contact the referred provider directly to check on the status.  Recommended Screenings:  Health Maintenance  Topic Date Due   Complete foot exam   12/18/2016   Hemoglobin A1C  01/15/2020   Medicare Annual Wellness Visit  07/07/2023   COVID-19 Vaccine (6 - Moderna risk 2025-26 season) 10/15/2024   DTaP/Tdap/Td vaccine (3 - Tdap) 12/09/2024   Eye exam for diabetics  02/27/2025   Pneumococcal Vaccine for age over 22  Completed   Flu Shot  Completed   Zoster (Shingles) Vaccine  Completed   Meningitis B Vaccine  Aged Out       06/22/2024   11:14 AM  Advanced Directives  Does Patient Have a Medical Advance Directive? Yes  Type of Estate Agent of [place];Living will  Copy of Healthcare Power of Attorney in Chart? No - copy requested    Vision: Annual vision screenings are recommended for early detection of glaucoma, cataracts, and diabetic retinopathy. These exams can also reveal signs of chronic conditions such as diabetes and high blood pressure.  Dental: Annual dental screenings help detect early signs of oral cancer, gum disease, and other conditions linked to overall health, including heart disease and diabetes.  Please see the attached documents  for additional preventive care recommendations.

## 2024-07-12 ENCOUNTER — Other Ambulatory Visit: Payer: Self-pay | Admitting: Family Medicine

## 2024-07-16 ENCOUNTER — Telehealth: Payer: Self-pay | Admitting: Family Medicine

## 2024-07-16 NOTE — Telephone Encounter (Unsigned)
 Copied from CRM [id number]. Topic: Clinical - Medication Refill >> Jul 16, 2024  9:40 AM Vena HERO wrote: Medication: insulin  glargine (LANTUS ) 100 UNIT/ML injection  Has the patient contacted their pharmacy? No (Agent: If no, request that the patient contact the pharmacy for the refill. If patient does not wish to contact the pharmacy document the reason why and proceed with request.) (Agent: If yes, when and what did the pharmacy advise?)  This is the patient's preferred pharmacy:  [facility] - [place], [place] - [address] [place] [address] Phone: [phone] Fax: [phone]  Is this the correct pharmacy for this prescription? Yes If no, delete pharmacy and type the correct one.   Has the prescription been filled recently? No  Is the patient out of the medication? No  Has the patient been seen for an appointment in the last year OR does the patient have an upcoming appointment? Yes  Can we respond through MyChart? Yes  Agent: Please be advised that Rx refills may take up to 3 business days. We ask that you follow-up with your pharmacy.

## 2024-07-17 ENCOUNTER — Telehealth: Payer: Self-pay

## 2024-07-17 LAB — OPHTHALMOLOGY REPORT-SCANNED

## 2024-07-17 NOTE — Telephone Encounter (Signed)
 Copied from CRM #[id number]. Topic: Clinical - Medication Question >> Jul 17, 2024 10:50 AM Revonda D wrote: Reason for CRM: Lan with [facility] pharmacy is requesting a refill for the  insulin  glargine (LANTUS ) 100 UNIT/ML injection and stated they sent a request on 12/4.

## 2024-07-19 MED ORDER — INSULIN GLARGINE 100 UNIT/ML ~~LOC~~ SOLN: 5.0000 [IU] | Freq: Two times a day (BID) | SUBCUTANEOUS | 1 refills | Status: AC

## 2024-07-19 NOTE — Telephone Encounter (Signed)
 RX for lantus  100 unit/mL injection sent to [facility] per Dr sebastian.

## 2024-07-19 NOTE — Addendum Note (Signed)
 Addended by: Dastan Krider on: 07/19/2024 12:56 PM   Modules accepted: Orders

## 2024-08-07 ENCOUNTER — Ambulatory Visit: Admitting: Family Medicine

## 2024-09-10 ENCOUNTER — Ambulatory Visit

## 2024-09-12 ENCOUNTER — Telehealth: Payer: Self-pay | Admitting: Cardiology

## 2024-09-12 ENCOUNTER — Ambulatory Visit: Admitting: Family Medicine

## 2024-09-12 VITALS — BP 126/76 | HR 69 | Temp 97.7°F | Ht 73.0 in | Wt 229.4 lb

## 2024-09-12 DIAGNOSIS — H353221 Exudative age-related macular degeneration, left eye, with active choroidal neovascularization: Secondary | ICD-10-CM

## 2024-09-12 DIAGNOSIS — E1142 Type 2 diabetes mellitus with diabetic polyneuropathy: Secondary | ICD-10-CM

## 2024-09-12 DIAGNOSIS — D508 Other iron deficiency anemias: Secondary | ICD-10-CM

## 2024-09-12 DIAGNOSIS — I10 Essential (primary) hypertension: Secondary | ICD-10-CM

## 2024-09-12 DIAGNOSIS — E782 Mixed hyperlipidemia: Secondary | ICD-10-CM

## 2024-09-12 DIAGNOSIS — I2510 Atherosclerotic heart disease of native coronary artery without angina pectoris: Secondary | ICD-10-CM

## 2024-09-12 DIAGNOSIS — M1A09X0 Idiopathic chronic gout, multiple sites, without tophus (tophi): Secondary | ICD-10-CM

## 2024-09-12 DIAGNOSIS — D518 Other vitamin B12 deficiency anemias: Secondary | ICD-10-CM

## 2024-09-12 DIAGNOSIS — C080 Malignant neoplasm of submandibular gland: Secondary | ICD-10-CM

## 2024-09-12 DIAGNOSIS — I4729 Other ventricular tachycardia: Secondary | ICD-10-CM | POA: Insufficient documentation

## 2024-09-12 DIAGNOSIS — E039 Hypothyroidism, unspecified: Secondary | ICD-10-CM

## 2024-09-12 LAB — POCT GLYCOSYLATED HEMOGLOBIN (HGB A1C): Hemoglobin A1C: 7.3 % — AB (ref 4.0–5.6)

## 2024-09-12 MED ORDER — OZEMPIC (0.25 OR 0.5 MG/DOSE) 2 MG/3ML ~~LOC~~ SOPN: 0.5000 mg | PEN_INJECTOR | SUBCUTANEOUS | 1 refills | Status: AC

## 2024-09-12 NOTE — Patient Instructions (Addendum)
 It was very nice to see you today!  VISIT SUMMARY: Today, we reviewed your diabetes and blood pressure management. We discussed your current medications and made some adjustments to improve your blood sugar control. We also reviewed your other health conditions and ensured your ongoing treatments are on track.  YOUR PLAN: TYPE 2 DIABETES MELLITUS WITH DIABETIC POLYNEUROPATHY: Your blood sugar levels are not well-controlled, especially at night. We discussed new medication options to help manage your diabetes better. -Start Ozempic  0.5 mg once weekly. -Discontinue glipizide . -Monitor your blood sugars and adjust glargine as needed. -Our in-house pharmacist will help manage your medications.  NONSUSTAINED VENTRICULAR TACHYCARDIA: Your heart condition is managed with metoprolol  and you follow up with cardiology for your pacemaker. -Continue metoprolol  25 mg daily. -Continue follow-up with cardiology.  PRIMARY SQUAMOUS CELL CARCINOMA OF SUBMANDIBULAR GLAND: Your cancer is in remission and you follow up with oncology for ongoing management. -Continue follow-up with oncology.  EXUDATIVE AGE-RELATED MACULAR DEGENERATION OF LEFT EYE WITH ACTIVE CHOROIDAL NEOVASCULARIZATION: You follow up with ophthalmology for management of your eye condition. -Continue follow-up with ophthalmology.  ESSENTIAL HYPERTENSION: Your blood pressure is well-controlled with your current medications. -Continue benazepril -hydrochlorothiazide  20-25 mg daily. -Continue metoprolol  25 mg daily.  CORONARY ARTERY DISEASE INVOLVING NATIVE CORONARY ARTERY OF NATIVE HEART WITHOUT ANGINA PECTORIS: You have no current angina and are managed with aspirin  for heart protection. -Continue aspirin  81 mg daily.  MIXED HYPERLIPIDEMIA: Your cholesterol levels need monitoring. -Get a fasting lipid panel done.  ACQUIRED HYPOTHYROIDISM: Your thyroid  condition is managed with levothyroxine  and needs monitoring. -Get a TSH test with reflex  to T4.  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 -Continue iron supplements. -Continue follow-up with oncology.  OTHER VITAMIN B12 DEFICIENCY ANEMIA: Your B12 deficiency is managed with supplements and monitored by oncology. -Continue B12 supplements. -Continue follow-up with oncology.  IDIOPATHIC CHRONIC GOUT OF MULTIPLE SITES WITHOUT TOPHUS: Your gout is managed with allopurinol  and needs monitoring of uric acid levels. -Get a uric acid level test. -Continue allopurinol  300 mg daily.  Return in about 1 month (around 10/10/2024) for BP, DM.   Take care, Arvella Hummer, MD, MS   PLEASE NOTE:  If you had any lab tests, please let us  know if you have not heard back within a few days. You may see your results on mychart before we have a chance to review them but we will give you a call once they are reviewed by us .   If we ordered any referrals today, please let us  know if you have not heard from their office within the next week.   If you had any urgent prescriptions sent in today, please check with the pharmacy within an hour of our visit to make sure the prescription was transmitted appropriately.   Please try these tips to maintain a healthy lifestyle:  Eat at least 3 REAL meals and 1-2 snacks per day.  Aim for no more than 5 hours between eating.  If you eat breakfast, please do so within one hour of getting up.   Each meal should contain half fruits/vegetables, one quarter protein, and one quarter carbs (no bigger than a computer mouse)  Cut down on sweet beverages. This includes juice, soda, and sweet tea.   Drink at least 1 glass of water with each meal and aim for at least 8 glasses per day  Exercise at least 150 minutes every week.

## 2024-09-12 NOTE — Telephone Encounter (Signed)
 Called re missed remote

## 2024-09-12 NOTE — Progress Notes (Signed)
 " Assessment & Plan   Assessment/Plan:    Assessment & Plan Type 2 diabetes mellitus with diabetic polyneuropathy, with long-term current use of insulin  (HCC) A1c is 7.3, indicating suboptimal control. Blood sugars are elevated at night, requiring increased insulin  use. Discussed potential benefits of GLP-1 receptor agonists (Ozempic  or Mounjaro) for better glycemic control, renal and cardiac protection, and weight loss. Consideration of SGLT2 inhibitors (Farxiga or Jardiance) as alternatives, but GLP-1s preferred due to additional benefits and less impact on prostate issues. - Started Ozempic  0.5 mg once weekly. - Discontinued glipizide . - Monitor blood sugars and adjust glargine as needed. - Coordinated with in-house pharmacist for medication management.  Nonsustained ventricular tachycardia (HCC) Managed with metoprolol  for rate control and blood pressure management. Follows with cardiology for pacemaker management. - Continue metoprolol  25 mg daily. - Continue follow-up with cardiology.  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 Follows with oncology for ongoing management. Recent Mohs procedure with good margins. - Continue follow-up with oncology.  Exudative age-related macular degeneration of left eye with active choroidal neovascularization (HCC) Follows with ophthalmology for management. - Continue follow-up with ophthalmology.  Essential hypertension Blood pressure is well-controlled at 126/76 mmHg. Managed with benazepril -hydrochlorothiazide  and metoprolol . - Continue benazepril -hydrochlorothiazide  20-25 mg daily. - Continue metoprolol  25 mg daily.  Coronary artery disease involving native coronary artery of native heart without angina pectoris No current angina. Managed with aspirin  for antiplatelet therapy. - Continue aspirin  81 mg daily.  Mixed hyperlipidemia Requires monitoring. - Ordered fasting lipid panel.  Acquired  hypothyroidism Managed with levothyroxine . Requires monitoring of thyroid  function. - Ordered TSH with reflex to T4.  Iron deficiency anemia secondary to inadequate dietary iron intake Managed with iron supplements. Follows with oncology for monitoring. - Continue iron supplements. - Continue follow-up with oncology.  Other vitamin B12 deficiency anemia Managed with B12 supplements. Follows with oncology for monitoring. - Continue B12 supplements. - Continue follow-up with oncology.  Idiopathic chronic gout of multiple sites without tophus Managed with allopurinol . Requires monitoring of uric acid levels. - Ordered uric acid level. - Continue allopurinol  300 mg daily.  Recording duration: 23 minutes      Medications Discontinued During This Encounter  Medication Reason   polyethylene glycol (MIRALAX  / GLYCOLAX ) packet    oxymetazoline (AFRIN) 0.05 % nasal spray    docusate sodium  (COLACE) 100 MG capsule    mupirocin ointment (BACTROBAN) 2 %    glipiZIDE  (GLUCOTROL ) 5 MG tablet     Return in about 1 month (around 10/10/2024) for BP, DM.        Subjective:   Encounter date: 09/12/2024  Jonathan [name] is a 89 y.o. male who has Hypothyroidism; Type 2 diabetes mellitus with complication (HCC); Hyperlipidemia; Gout; Essential hypertension; Coronary atherosclerosis; RENAL CALCULUS, RECURRENT; DERMATITIS, CNTCT, ACUTE D/T SOLAR RADIATION; HEPATITIS B, HX OF; Benign prostatic hyperplasia; Edema; Knee pain, bilateral; Foreign body granuloma of soft tissue, NEC, right lower leg; Total knee replacement status; Blindness of right eye; GERD (gastroesophageal reflux disease); First degree AV block; Primary localized osteoarthritis of right knee; Primary osteoarthritis of right knee; Atypical nevus of shoulder; NSTEMI (non-ST elevated myocardial infarction) (HCC); Block, trifascicular; Exudative age-related macular degeneration of left eye with active choroidal neovascularization (HCC);  Serous detachment of retinal pigment epithelium, left; Intermediate stage nonexudative age-related macular degeneration of right eye; Posterior capsular opacification, right eye; History of retinal detachment; Pacemaker; Hx of myocardial infarction; Long term current use of insulin  (HCC); Mass of right side of neck;  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   He  has a past medical history of Allergy (1997), Anemia (2024), Benign prostatic hypertrophy, CAD (coronary artery disease), Cataract (2022), DJD (degenerative joint disease), GERD (gastroesophageal reflux disease) (2000), Glomerulonephritis acute, Gout, Hepatitis A (1950), Hepatitis B (1980's), History of kidney stones, HOH (hard of hearing), Hyperlipidemia (2015), Hypertension (1995), Hypothyroidism, Incontinence of urine, Legally blind in right eye, as defined in [place] , Personal history of other infectious and parasitic disease, Sleep apnea (1990's), Squamous cell carcinoma, Sun-damaged skin, Tuberculosis (1961), and Type II diabetes mellitus (HCC).SABRA   He presents with chief complaint of Medical Management of Chronic Issues .   Discussed the use of AI scribe software for clinical note transcription with the patient, who gave verbal consent to proceed.  History of Present Illness Jonathan [name] is an 89 year old male with diabetes and hypertension who presents for follow-up on blood pressure and diabetes management.  Glycemic control - Hemoglobin A1c is 7.3%. - Nocturnal blood glucose frequently in the 200  mg/dL range, decreasing to 70 mg/dL in the morning with treatment. - Increasing insulin  use at night to manage hyperglycemia. - Current medications: glipizide  5 mg twice daily, insulin  sliding scale (5-50 units), metformin  1000 mg twice daily, pioglitazone  45 mg daily.  Hypertension and cardiac history - Blood pressure at goal, most recent reading 126/76 mmHg. - Current antihypertensive medications: benazepril  hydrochlorothiazide  20/25 mg daily, metoprolol  25 mg every 24 hours. - History of non-sustained ventricular tachycardia, follows with cardiology. - Implantable pacemaker in place. - History of NSTEMI and coronary artery disease. - No current angina.  Oncologic history - History of squamous cell carcinoma of the submandibular gland, currently in remission. - Ongoing management by oncology.  Thyroid  dysfunction - Hypothyroidism managed with levothyroxine  100 micrograms daily.  Gout - Gout managed with allopurinol  300 mg daily.  Lower urinary tract symptoms - Nocturia managed with tamsulosin  and Gemtesa. - Follows with urology for benign prostatic hyperplasia.  Ophthalmologic findings - Exudative age-related macular degeneration of the left eye with active choroidal neovascularization. - Follows with ophthalmology for management.  Obesity - Body mass index is 30.  Laboratory monitoring - No recent laboratory work including fasting lipid panel, complete metabolic panel, or urinalysis.     ROS  Past Surgical History:  Procedure Laterality Date   CARDIAC CATHETERIZATION     CATARACT EXTRACTION W/ INTRAOCULAR LENS  IMPLANT, BILATERAL Bilateral    CORONARY ARTERY BYPASS GRAFT  01/23/2004   Dr Lucas   CORONARY STENT INTERVENTION  07/17/2019   CORONARY STENT INTERVENTION N/A 07/17/2019   Procedure: CORONARY STENT INTERVENTION;  Surgeon: Jordan, Peter M, MD;  Location[facility] INVASIVE CV LAB;  Service: Cardiovascular;  Laterality: N/A;   CYSTOSCOPY WITH INSERTION OF UROLIFT  N/A 06/23/2017   Procedure: CYSTOSCOPY WITH INSERTION OF UROLIFT;  Surgeon: Matilda Senior, MD;  Location: [facility];  Service: Urology;  Laterality: N/A;   EYE SURGERY Right 2003   detached retina ,blind in right eye   INGUINAL HERNIA REPAIR Bilateral 1980's - 1990's   JOINT REPLACEMENT  2015   Bilat knee   LEFT HEART CATH AND CORONARY  ANGIOGRAPHY N/A 07/17/2019   Procedure: LEFT HEART CATH AND CORONARY ANGIOGRAPHY;  Surgeon: Jordan, Peter M, MD;  Location: [facility] INVASIVE CV LAB;  Service: Cardiovascular;  Laterality: N/A;   MASTOIDECTOMY Right 1943   PACEMAKER IMPLANT N/A 09/04/2021   Procedure: PACEMAKER IMPLANT;  Surgeon: Kelsie Agent, MD;  Location: [facility] INVASIVE CV LAB;  Service: Cardiovascular;  Laterality: N/A;   QUADRICEPS REPAIR Right 2009   RETINAL DETACHMENT SURGERY Right X 2   SEPSIS  1943   TONSILLECTOMY  ~ 1943   TOTAL KNEE ARTHROPLASTY Left 09/18/2013   Procedure: TOTAL KNEE ARTHROPLASTY;  Surgeon: Maude KANDICE Herald, MD;  Location: [facility] OR;  Service: Orthopedics;  Laterality: Left;   TOTAL KNEE ARTHROPLASTY Right 08/22/2014   Procedure: RIGHT TOTAL KNEE ARTHROPLASTY;  Surgeon: Maude KANDICE Herald, MD;  Location: [facility] OR;  Service: Orthopedics;  Laterality: Right;   UMBILICAL HERNIA REPAIR     UVULOPALATOPLASTY  1990's   ENT MD DR MALVINA    Outpatient Medications Prior to Visit  Medication Sig Dispense Refill   allopurinol  (ZYLOPRIM ) 300 MG tablet Take 1 tablet (300 mg total) by mouth daily. 90 tablet 4   aspirin  EC 81 MG tablet Take 81 mg by mouth daily.     benazepril -hydrochlorthiazide (LOTENSIN  HCT) 20-25 MG tablet Take 1 tablet by mouth daily. 90 tablet 3   cetirizine (ZYRTEC) 10 MG tablet Take 10 mg by mouth daily as needed for allergies.     cyanocobalamin  (VITAMIN B12) 1000 MCG tablet Take 1,000 mcg by mouth daily. (Patient not taking: Reported on 06/22/2024)     ferrous sulfate  325 (65 FE) MG EC tablet Take 1 tablet (325 mg total) by mouth in the  morning and at bedtime. 180 tablet 3   furosemide  (LASIX ) 20 MG tablet Take 1 tablet (20 mg total) by mouth daily as needed for fluid or edema. (Patient not taking: Reported on 06/22/2024) 100 tablet 3   GEMTESA 75 MG TABS Take 1 tablet by mouth daily.     glucose blood (FREESTYLE LITE) test strip USE TWICE A [name] AS DIRECTED (E11.9) 200 each 1   insulin  glargine (LANTUS ) 100 UNIT/ML injection Inject 0.05-0.5 mLs (5-50 Units total) into the skin 2 (two) times daily. Per sliding scale 10 mL 1   levothyroxine  (SYNTHROID ) 100 MCG tablet TAKE ONE (1) TABLET BY MOUTH EVERY [name] 90 tablet 4   metFORMIN  (GLUCOPHAGE ) 1000 MG tablet TAKE ONE (1) TABLET BY MOUTH TWO (2) TIMES DAILY 180 tablet 1   metoprolol  succinate (TOPROL -XL) 25 MG 24 hr tablet TAKE ONE TABLET BY MOUTH EVERY EVENING AFTER MEAL 90 tablet 3   Multiple Vitamin (MULTIVITAMIN PO) Take 1 tablet by mouth daily.       nitroGLYCERIN  (NITROSTAT ) 0.4 MG SL tablet Place 1 tablet (0.4 mg total) under the tongue every 5 (five) minutes x 3 doses as needed for chest pain. (Patient not taking: Reported on 06/22/2024) 25 tablet 2   Omega-3 Fatty Acids (FISH OIL) 1000 MG CAPS Take 1,000 mg by mouth daily.      omeprazole  (PRILOSEC) 20 MG capsule TAKE ONE CAPSULE BY MOUTH DAILY 90 capsule 4   oxybutynin (DITROPAN XL) 15 MG 24 hr tablet Take 15 mg by mouth daily. (Patient not taking: Reported on 06/22/2024)     pioglitazone  (ACTOS ) 45 MG tablet Take 1 tablet (45 mg total) by mouth daily. 90 tablet 3   polycarbophil (FIBERCON) 625 MG tablet Take 1,250 mg by mouth 2 (two) times daily.  Probiotic Product (ALIGN) 4 MG CAPS Take 1 capsule (4 mg total) by mouth daily. 90 capsule 3   simvastatin  (ZOCOR ) 40 MG tablet TAKE 1 TABLET BY MOUTH NIGHTLY AT BEDTIME 90 tablet 3   tamsulosin  (FLOMAX ) 0.4 MG CAPS capsule Take 0.4 mg by mouth daily.     docusate sodium  (COLACE) 100 MG capsule Take 100 mg by mouth daily. (Patient not taking: Reported on 04/30/2024)      glipiZIDE  (GLUCOTROL ) 5 MG tablet TAKE ONE (1) TABLET BY MOUTH TWO (2) TIMES DAILY 180 tablet 4   mupirocin ointment (BACTROBAN) 2 % Apply topically. (Patient not taking: Reported on 04/30/2024)     oxymetazoline (AFRIN) 0.05 % nasal spray Place 1 spray into both nostrils 2 (two) times daily as needed for congestion. (Patient not taking: Reported on 06/22/2024)     polyethylene glycol (MIRALAX  / GLYCOLAX ) packet Take 17 g by mouth daily as needed for moderate constipation. (Patient not taking: Reported on 06/22/2024)     No facility-administered medications prior to visit.    Family History  Problem Relation Age of Onset   Diabetes Other        1st degree relative   Hypertension Other        Family History   Cancer Mother    Cancer Sister    Early death Sister     Social History   Socioeconomic History   Marital status: Married    Spouse name: Not on file   Number of children: Not on file   Years of education: Not on file   Highest education level: Professional school degree (e.g., MD, DDS, DVM, JD)  Occupational History   Occupation: retired  Tobacco Use   Smoking status: Former    Current packs/[name]: 0.00    Average packs/[name]: 1 pack/[name] for 10.0 years (10.0 ttl pk-yrs)    Types: Cigarettes    Start date: 09/10/1955    Quit date: 09/09/1965    Years since quitting: 59.0   Smokeless tobacco: Never  Vaping Use   Vaping status: Never Used  Substance and Sexual Activity   Alcohol use: Yes    Alcohol/week: 7.0 standard drinks of alcohol    Types: 2 Glasses of wine, 5 Standard drinks or equivalent per week    Comment: Glass of wine on social occasion or out to ear   Drug use: No   Sexual activity: Not Currently  Other Topics Concern   Not on file  Social History Narrative   Pt lives in [place].  Retired designer, industrial/product (retired 2005)   Social Drivers of Health   Tobacco Use: Medium Risk (06/22/2024)   Patient History    Smoking Tobacco Use: Former    Smokeless  Tobacco Use: Never    Passive Exposure: Not on Actuary Strain: Low Risk (06/20/2024)   Overall Financial Resource Strain (CARDIA)    Difficulty of Paying Living Expenses: Not hard at all  Food Insecurity: No Food Insecurity (06/20/2024)   Epic    Worried About Programme Researcher, Broadcasting/film/video in the Last Year: Never true    Ran Out of Food in the Last Year: Never true  Transportation Needs: No Transportation Needs (06/20/2024)   Epic    Lack of Transportation (Medical): No    Lack of Transportation (Non-Medical): No  Physical Activity: Insufficiently Active (06/20/2024)   Exercise Vital Sign    Days of Exercise per Week: 2 days    Minutes of Exercise per Session: 50 min  Stress:  No Stress Concern Present (06/20/2024)   [employer] of Occupational Health - Occupational Stress Questionnaire    Feeling of Stress: Not at all  Social Connections: Socially Integrated (06/20/2024)   Social Connection and Isolation Panel    Frequency of Communication with Friends and Family: Twice a week    Frequency of Social Gatherings with Friends and Family: Three times a week    Attends Religious Services: 1 to 4 times per year    Active Member of Clubs or Organizations: Yes    Attends Banker Meetings: Never    Marital Status: Married  Catering Manager Violence: Not At Risk (06/22/2024)   Epic    Fear of Current or Ex-Partner: No    Emotionally Abused: No    Physically Abused: No    Sexually Abused: No  Depression (PHQ2-9): Low Risk (06/22/2024)   Depression (PHQ2-9)    PHQ-2 Score: 0  Alcohol Screen: Low Risk (06/20/2024)   Alcohol Screen    Last Alcohol Screening Score (AUDIT): 4  Housing: Low Risk (06/20/2024)   Epic    Unable to Pay for Housing in the Last Year: No    Number of Times Moved in the Last Year: 0    Homeless in the Last Year: No  Utilities: Not At Risk (06/22/2024)   Epic    Threatened with loss of utilities: No  Health Literacy: Adequate Health  Literacy (06/22/2024)   B1300 Health Literacy    Frequency of need for help with medical instructions: Never                                                                                                  Objective:  Physical Exam: BP 126/76   Pulse 69   Temp 97.7 F (36.5 C)   Ht 6' 1 (1.854 m)   Wt 229 lb 6.4 oz (104.1 kg)   SpO2 96%   BMI 30.27 kg/m    Physical Exam VITALS: BP- 126/76 MEASUREMENTS: BMI- 30.0. GENERAL: Alert, cooperative, well developed, no acute distress HEENT: Normocephalic, normal oropharynx, moist mucous membranes CHEST: Clear to auscultation bilaterally, no wheezes, rhonchi, or crackles CARDIOVASCULAR: Normal heart rate and rhythm, S1 and S2 normal without murmurs ABDOMEN: Soft, non-tender, non-distended, without organomegaly, normal bowel sounds EXTREMITIES: No cyanosis or edema NEUROLOGICAL: Cranial nerves grossly intact, moves all extremities without gross motor or sensory deficit   Physical Exam  No results found.  Recent Results (from the past 2160 hours)  OPHTHALMOLOGY REPORT-SCANNED     Status: None   Collection Time: 07/17/24 11:51 AM  Result Value Ref Range   HM Diabetic Eye Exam No Retinopathy No Retinopathy    Comment: Abstracted by HIM   A Comment    POCT HgB A1C     Status: Abnormal   Collection Time: 09/12/24  1:20 PM  Result Value Ref Range   Hemoglobin A1C 7.3 (A) 4.0 - 5.6 %   HbA1c POC (<> result, manual entry)     HbA1c, POC (prediabetic range)     HbA1c, POC (controlled diabetic range)  Beverley Adine Hummer, MD, MS "

## 2024-09-13 ENCOUNTER — Telehealth: Payer: Self-pay

## 2024-09-13 LAB — CUP PACEART REMOTE DEVICE CHECK
Battery Remaining Longevity: 109 mo
Battery Voltage: 3.01 V
Brady Statistic AP VP Percent: 42.15 %
Brady Statistic AP VS Percent: 0.01 %
Brady Statistic AS VP Percent: 57.54 %
Brady Statistic AS VS Percent: 0.3 %
Brady Statistic RA Percent Paced: 41.85 %
Brady Statistic RV Percent Paced: 99.69 %
Date Time Interrogation Session: 20260204123306
Implantable Lead Connection Status: 753985
Implantable Lead Connection Status: 753985
Implantable Lead Implant Date: 20230127
Implantable Lead Implant Date: 20230127
Implantable Lead Location: 753859
Implantable Lead Location: 753860
Implantable Lead Model: 3830
Implantable Lead Model: 5076
Implantable Pulse Generator Implant Date: 20230127
Lead Channel Impedance Value: 285 Ohm
Lead Channel Impedance Value: 418 Ohm
Lead Channel Impedance Value: 418 Ohm
Lead Channel Impedance Value: 608 Ohm
Lead Channel Pacing Threshold Amplitude: 0.75 V
Lead Channel Pacing Threshold Amplitude: 1.125 V
Lead Channel Pacing Threshold Pulse Width: 0.4 ms
Lead Channel Pacing Threshold Pulse Width: 0.4 ms
Lead Channel Sensing Intrinsic Amplitude: 0.875 mV
Lead Channel Sensing Intrinsic Amplitude: 0.875 mV
Lead Channel Sensing Intrinsic Amplitude: 17.875 mV
Lead Channel Sensing Intrinsic Amplitude: 17.875 mV
Lead Channel Setting Pacing Amplitude: 1.5 V
Lead Channel Setting Pacing Amplitude: 2.25 V
Lead Channel Setting Pacing Pulse Width: 0.4 ms
Lead Channel Setting Sensing Sensitivity: 0.9 mV
Zone Setting Status: 755011
Zone Setting Status: 755011

## 2024-09-13 NOTE — Progress Notes (Unsigned)
 Complex Care Management Note Care Guide Note  09/13/2024 Name: Jonathan [name] MRN: [id number] DOB: 08/16/1935   Complex Care Management Outreach Attempts: An unsuccessful telephone outreach was attempted today to offer the patient information about available complex care management services.  Follow Up Plan:  Additional outreach attempts will be made to offer the patient complex care management information and services.   Encounter Outcome:  No Answer  Dreama [facility]  [facility], [facility] VBCI Assistant Direct Dial: [phone]  Fax: [phone]

## 2024-10-17 ENCOUNTER — Ambulatory Visit: Admitting: Family Medicine

## 2024-10-29 ENCOUNTER — Ambulatory Visit: Admitting: Oncology

## 2024-10-29 ENCOUNTER — Other Ambulatory Visit

## 2024-12-10 ENCOUNTER — Ambulatory Visit

## 2025-03-11 ENCOUNTER — Ambulatory Visit

## 2025-06-10 ENCOUNTER — Ambulatory Visit

## 2025-06-28 ENCOUNTER — Ambulatory Visit

## 2025-09-09 ENCOUNTER — Ambulatory Visit

## 2025-12-09 ENCOUNTER — Ambulatory Visit
